# Patient Record
Sex: Female | Born: 1940 | Race: Black or African American | Hispanic: No | Smoking: Never smoker
Health system: Southern US, Community
[De-identification: ages and names within clinical notes are randomized; demographics above are authoritative.]

## PROBLEM LIST (undated history)

## (undated) DIAGNOSIS — I10 Essential (primary) hypertension: Secondary | ICD-10-CM

## (undated) DIAGNOSIS — I82411 Acute embolism and thrombosis of right femoral vein: Secondary | ICD-10-CM

## (undated) DIAGNOSIS — I639 Cerebral infarction, unspecified: Secondary | ICD-10-CM

## (undated) DIAGNOSIS — C801 Malignant (primary) neoplasm, unspecified: Secondary | ICD-10-CM

---

## 2018-12-22 DIAGNOSIS — H47231 Glaucomatous optic atrophy, right eye: Secondary | ICD-10-CM | POA: Diagnosis not present

## 2018-12-22 DIAGNOSIS — I10 Essential (primary) hypertension: Secondary | ICD-10-CM | POA: Diagnosis not present

## 2018-12-22 DIAGNOSIS — Z13220 Encounter for screening for lipoid disorders: Secondary | ICD-10-CM | POA: Diagnosis not present

## 2018-12-22 DIAGNOSIS — E559 Vitamin D deficiency, unspecified: Secondary | ICD-10-CM | POA: Diagnosis not present

## 2018-12-29 IMAGING — US ULTRASOUND ABDOMEN COMPLETE
1 series · 14 of 25 positions shown · non-contrast
Comparison: None.

CLINICAL DATA: Right upper quadrant pain for 2 months.

EXAM:
ABDOMEN ULTRASOUND COMPLETE

[Series 1: ultrasound abdomen complete · 0.23mm/px · 14 of 85 slices shown]
[im 1/85]
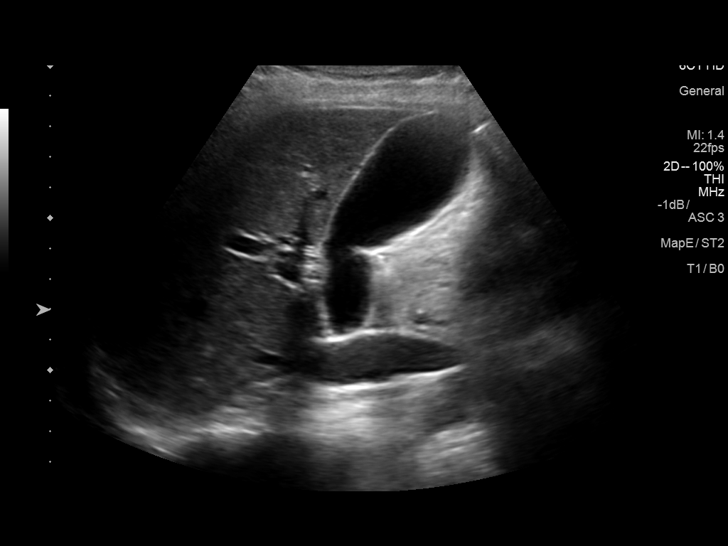
[im 8/85]
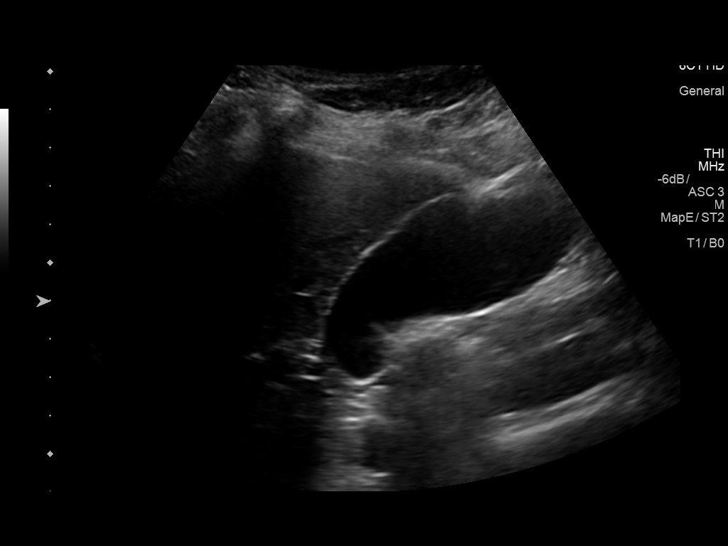
[im 15/85]
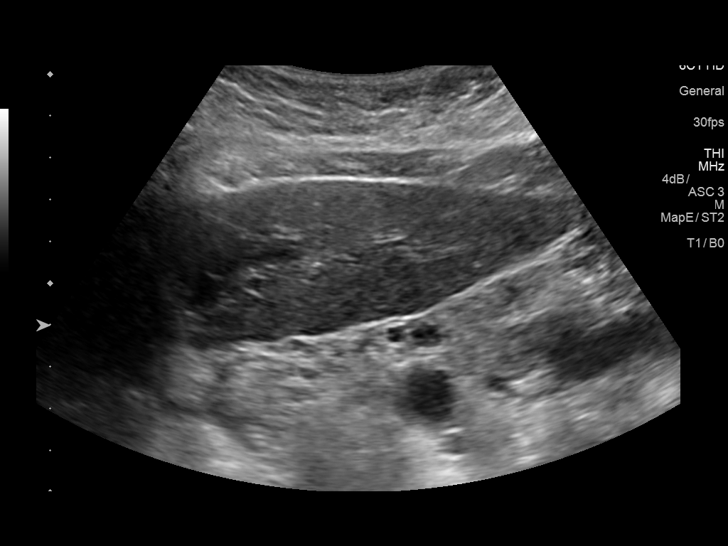
[im 22/85]
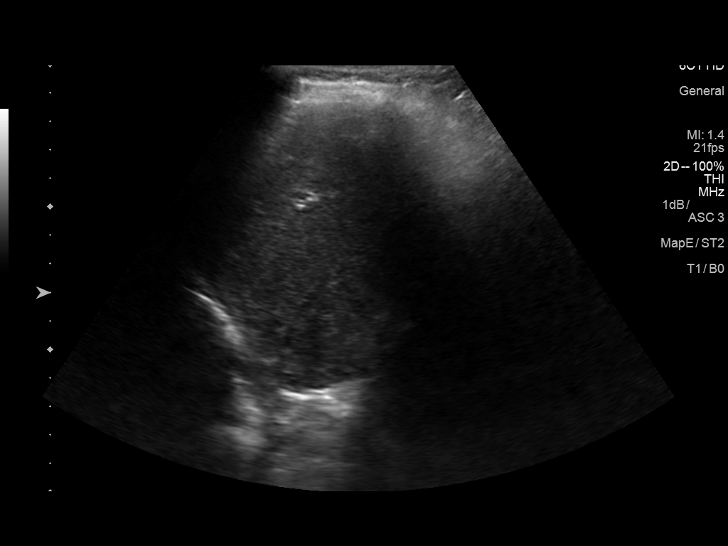
[im 29/85]
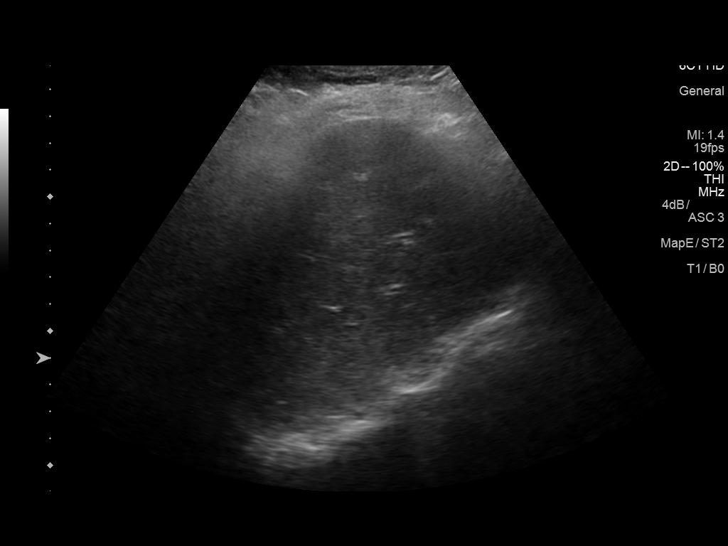
[im 32/85]
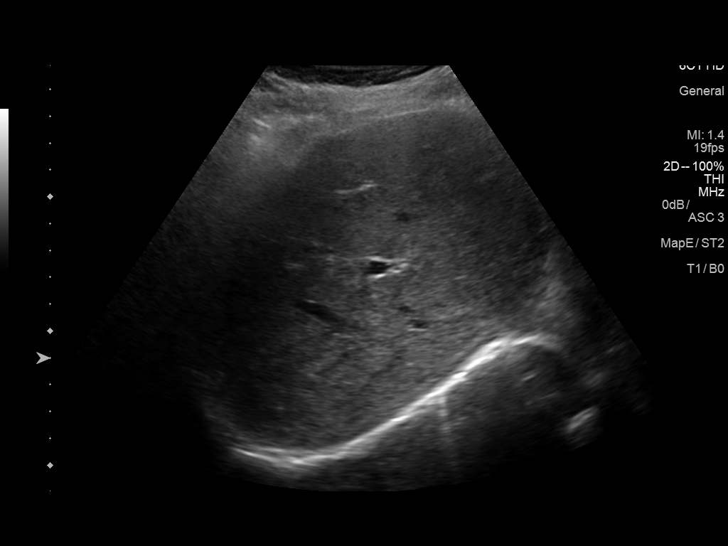
[im 39/85]
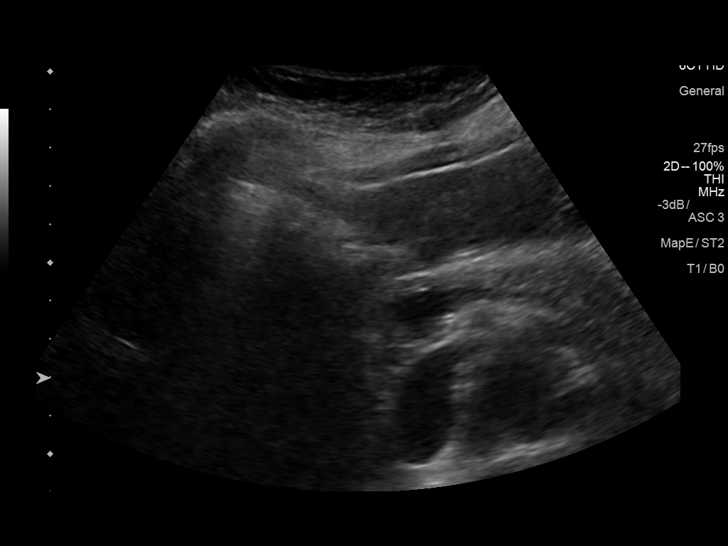
[im 46/85]
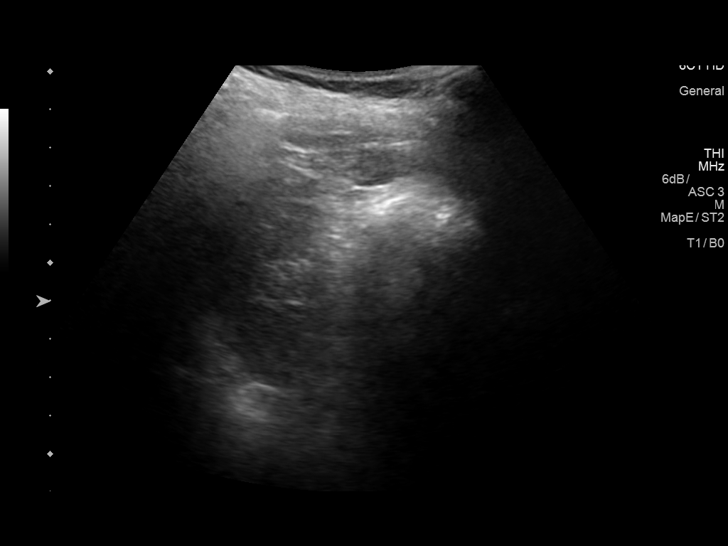
[im 53/85]
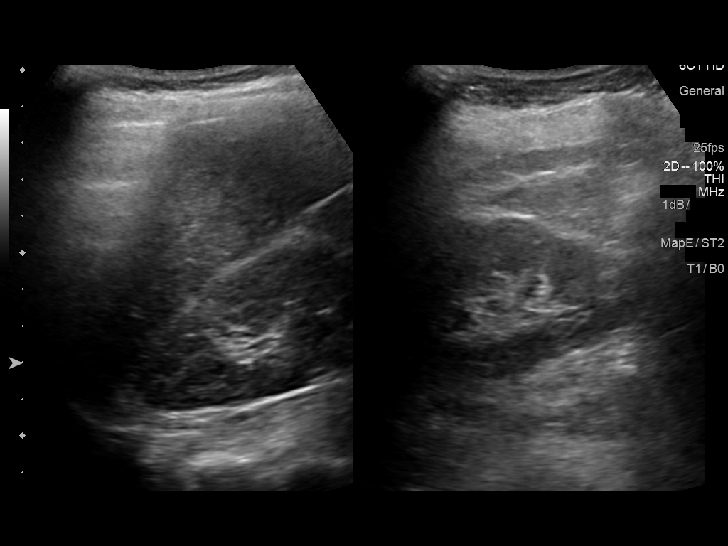
[im 57/85]
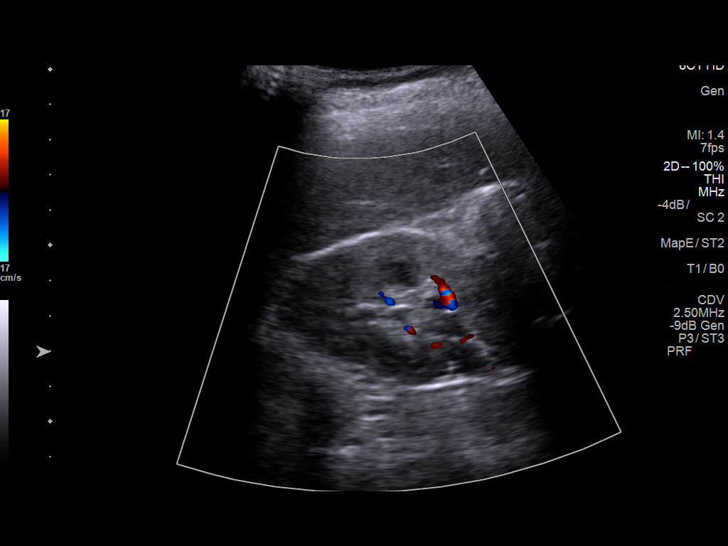
[im 64/85]
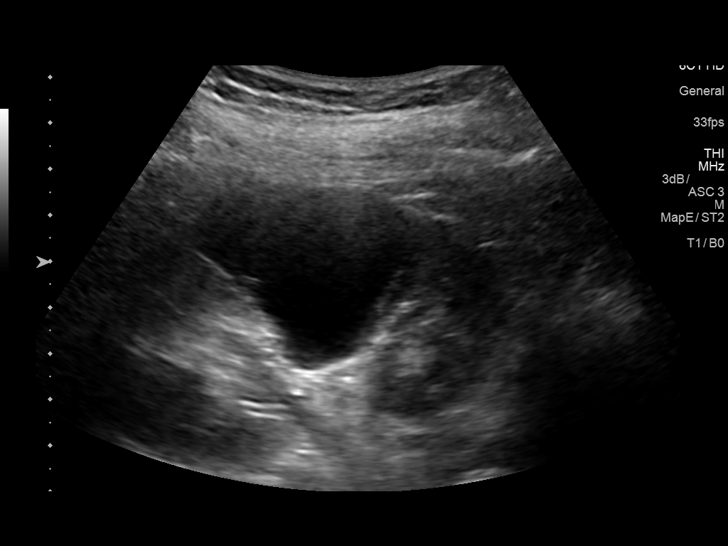
[im 71/85]
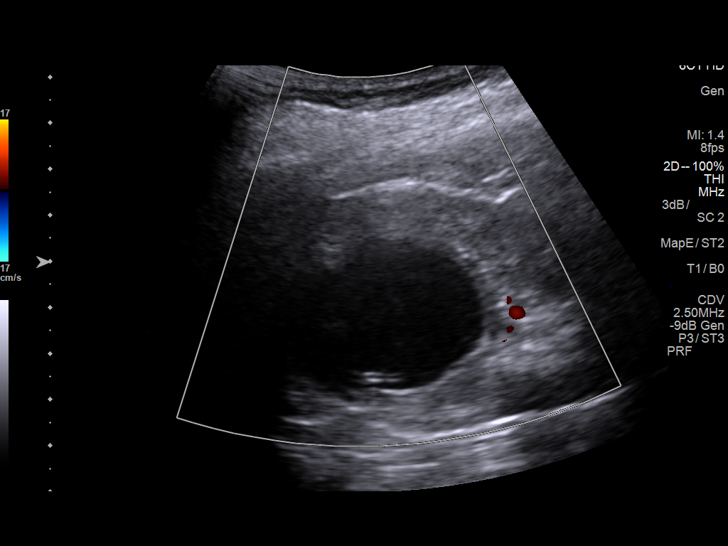
[im 78/85]
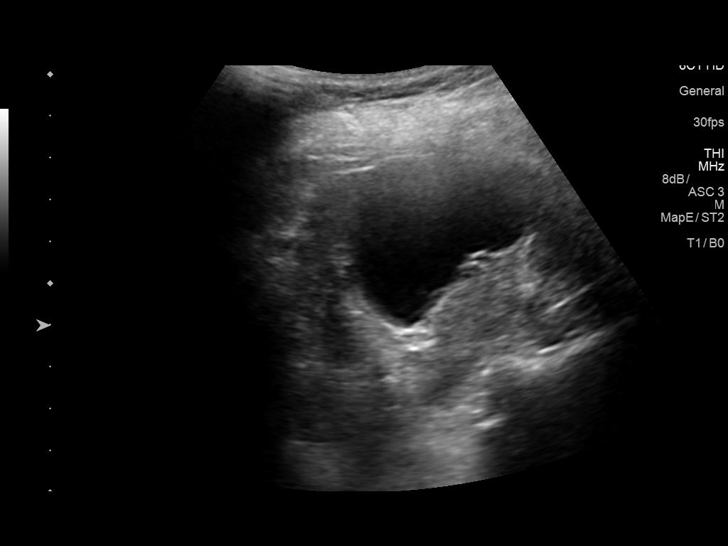
[im 85/85]
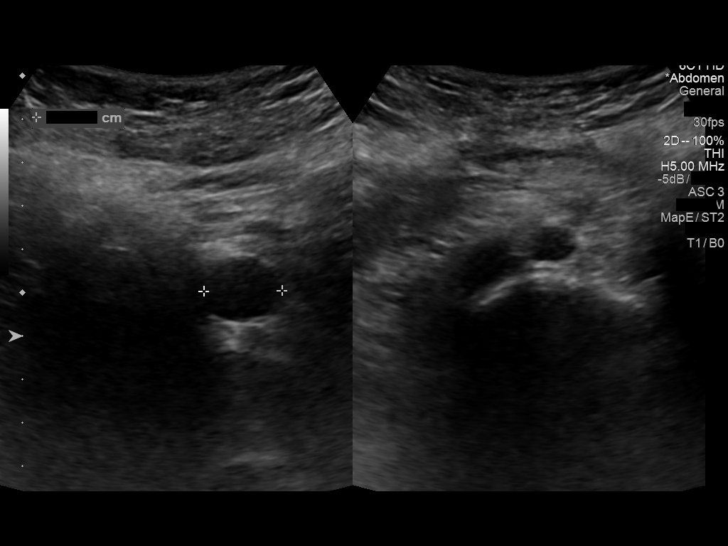

[14 of 25 positions shown; findings below may reference images not displayed]

FINDINGS: Gallbladder: No gallstones or wall thickening visualized. No
sonographic Murphy sign noted by sonographer.

Common bile duct: Diameter: 3.7 mm

Liver: No focal lesion identified. Within normal limits in
parenchymal echogenicity. Portal vein is patent on color Doppler
imaging with normal direction of blood flow towards the liver.

IVC: No abnormality visualized.

Pancreas: Visualized portion unremarkable.

Spleen: Size and appearance within normal limits.

Right Kidney: Length: 10.6 cm. Echogenicity within normal limits. No
mass or hydronephrosis visualized.

Left Kidney: Length: 10.1 cm. Echogenicity within normal limits. No
hydronephrosis visualized. Large mid polar mildly complicated cyst
measures 5.1 x 3.9 x 4.4 cm.

Abdominal aorta: No aneurysm visualized.

Other findings: None.
IMPRESSION: No evidence of cholelithiasis or acute cholecystitis.

Large mildly complicated 5.1 cm left renal cyst.

## 2019-01-02 ENCOUNTER — Ambulatory Visit
Admission: RE | Admit: 2019-01-02 | Discharge: 2019-01-02 | Disposition: A | Discharge status: Home / Self Care | Payer: Medicare HMO | Source: Ambulatory Visit | Attending: Nurse Practitioner | Admitting: Nurse Practitioner

## 2019-01-02 ENCOUNTER — Other Ambulatory Visit: Payer: Self-pay | Admitting: Nurse Practitioner

## 2019-01-02 DIAGNOSIS — R1011 Right upper quadrant pain: Secondary | ICD-10-CM | POA: Diagnosis not present

## 2019-01-02 DIAGNOSIS — R42 Dizziness and giddiness: Secondary | ICD-10-CM | POA: Diagnosis not present

## 2019-01-02 DIAGNOSIS — E559 Vitamin D deficiency, unspecified: Secondary | ICD-10-CM | POA: Diagnosis not present

## 2019-01-02 DIAGNOSIS — I10 Essential (primary) hypertension: Secondary | ICD-10-CM | POA: Diagnosis not present

## 2019-01-02 DIAGNOSIS — H47231 Glaucomatous optic atrophy, right eye: Secondary | ICD-10-CM | POA: Diagnosis not present

## 2019-01-02 DIAGNOSIS — Z0000 Encounter for general adult medical examination without abnormal findings: Secondary | ICD-10-CM | POA: Diagnosis not present

## 2019-01-02 IMAGING — CR DG ABDOMEN 1V
1 series · 1 of 1 positions shown · non-contrast
Comparison: None.

CLINICAL DATA: Right upper quadrant pain

EXAM:
ABDOMEN - 1 VIEW

[w abdomen upright]
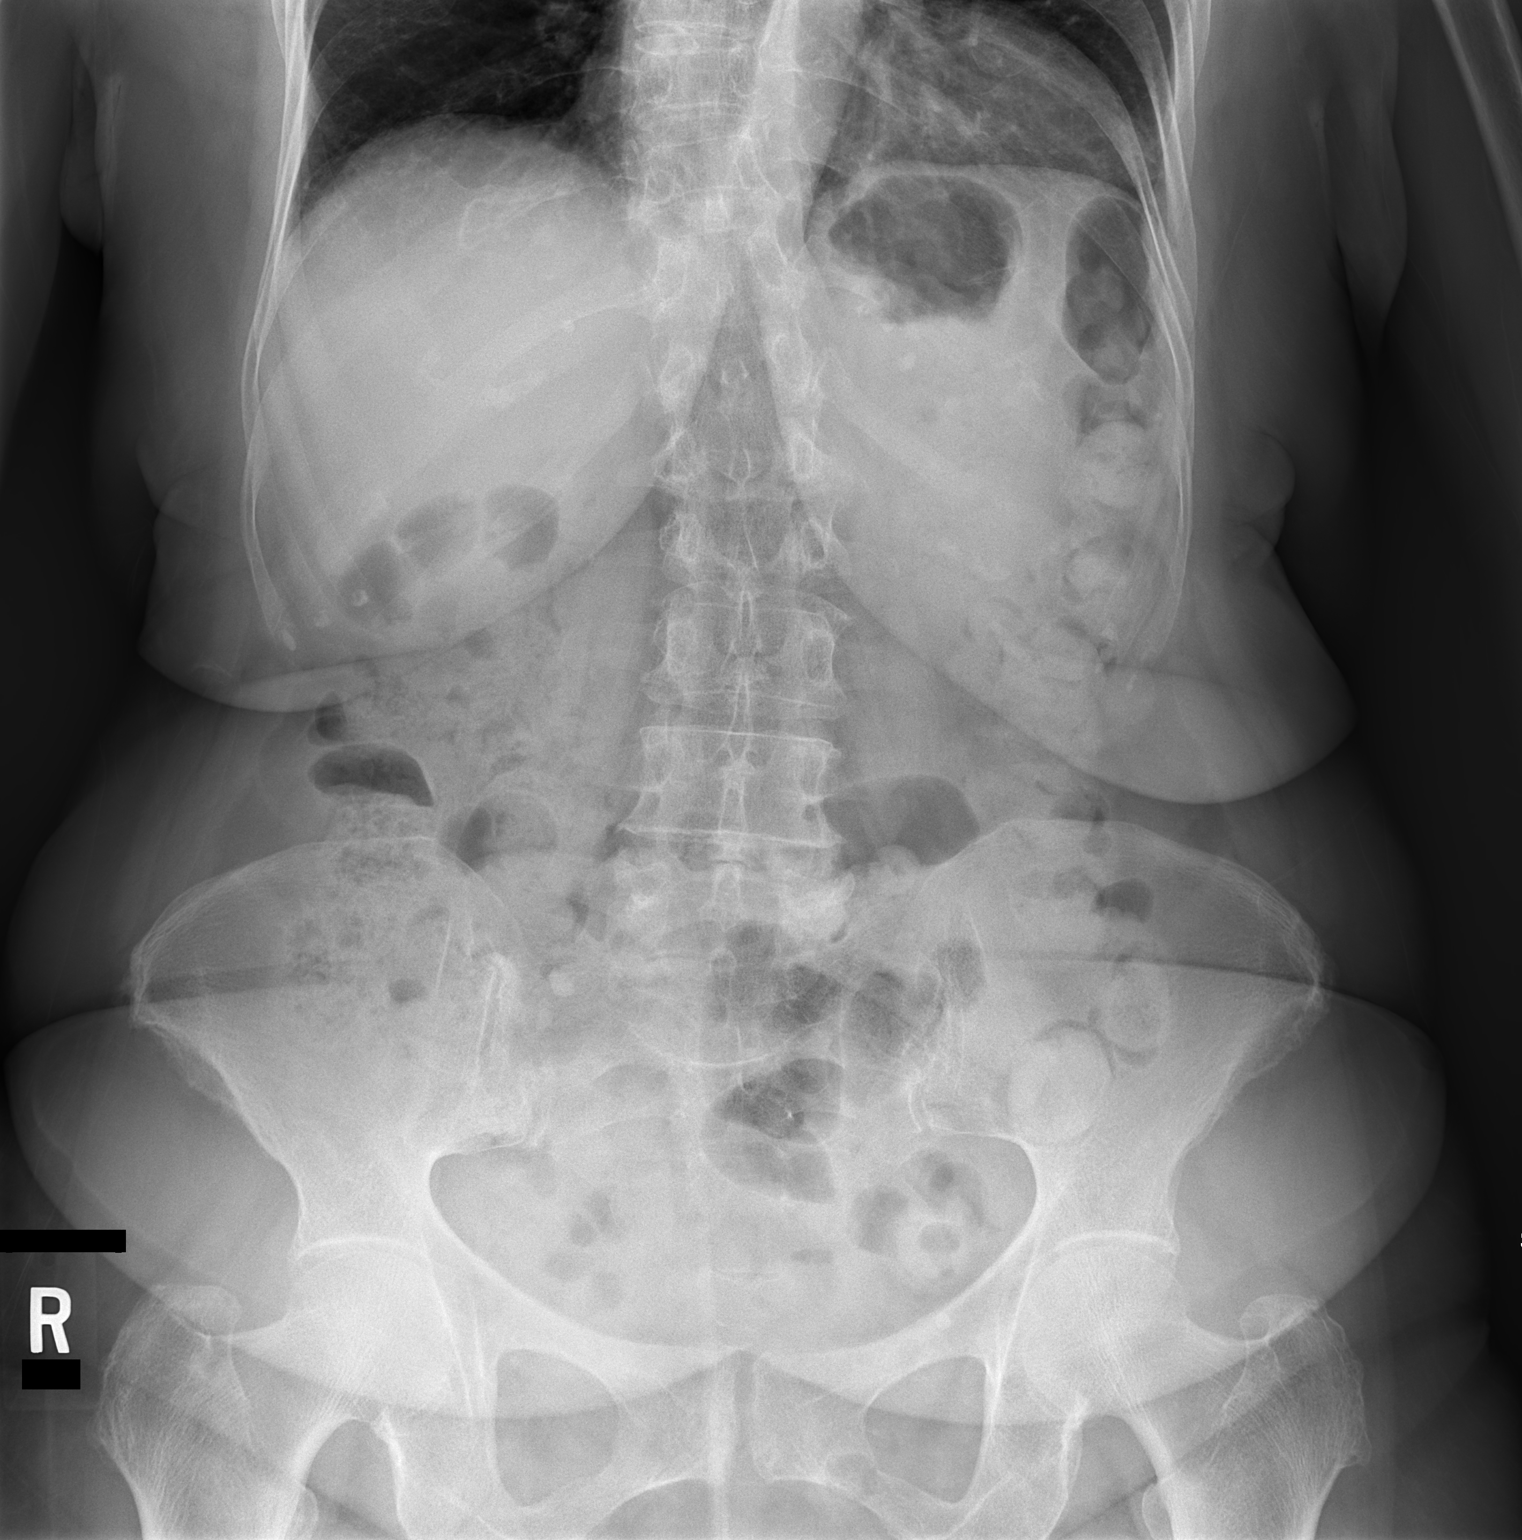

[1 of 1 positions shown; findings below may reference images not displayed]

FINDINGS: Moderate stool burden. There is a non obstructive bowel gas pattern.
No supine evidence of free air. No organomegaly or suspicious
calcification. No acute bony abnormality. Lung bases clear.
IMPRESSION: Moderate stool burden.  No acute findings.

## 2019-01-06 ENCOUNTER — Other Ambulatory Visit: Payer: Self-pay | Admitting: Nurse Practitioner

## 2019-01-06 DIAGNOSIS — R1011 Right upper quadrant pain: Secondary | ICD-10-CM

## 2019-01-12 ENCOUNTER — Ambulatory Visit
Admission: RE | Admit: 2019-01-12 | Discharge: 2019-01-12 | Disposition: A | Discharge status: Home / Self Care | Payer: Medicare HMO | Source: Ambulatory Visit | Attending: Nurse Practitioner | Admitting: Nurse Practitioner

## 2019-01-12 DIAGNOSIS — R1011 Right upper quadrant pain: Secondary | ICD-10-CM

## 2019-01-13 DIAGNOSIS — I10 Essential (primary) hypertension: Secondary | ICD-10-CM | POA: Diagnosis not present

## 2019-01-13 DIAGNOSIS — K210 Gastro-esophageal reflux disease with esophagitis: Secondary | ICD-10-CM | POA: Diagnosis not present

## 2019-01-13 DIAGNOSIS — D640 Hereditary sideroblastic anemia: Secondary | ICD-10-CM | POA: Diagnosis not present

## 2019-03-20 DIAGNOSIS — K219 Gastro-esophageal reflux disease without esophagitis: Secondary | ICD-10-CM | POA: Diagnosis not present

## 2019-03-20 DIAGNOSIS — D508 Other iron deficiency anemias: Secondary | ICD-10-CM | POA: Diagnosis not present

## 2019-03-20 DIAGNOSIS — I10 Essential (primary) hypertension: Secondary | ICD-10-CM | POA: Diagnosis not present

## 2019-03-20 DIAGNOSIS — E559 Vitamin D deficiency, unspecified: Secondary | ICD-10-CM | POA: Diagnosis not present

## 2019-06-01 DIAGNOSIS — Z20828 Contact with and (suspected) exposure to other viral communicable diseases: Secondary | ICD-10-CM | POA: Diagnosis not present

## 2019-06-21 DIAGNOSIS — I10 Essential (primary) hypertension: Secondary | ICD-10-CM | POA: Diagnosis not present

## 2019-06-21 DIAGNOSIS — R42 Dizziness and giddiness: Secondary | ICD-10-CM | POA: Diagnosis not present

## 2019-06-21 DIAGNOSIS — I110 Hypertensive heart disease with heart failure: Secondary | ICD-10-CM | POA: Diagnosis not present

## 2019-06-28 DIAGNOSIS — R42 Dizziness and giddiness: Secondary | ICD-10-CM | POA: Diagnosis not present

## 2019-06-28 DIAGNOSIS — D640 Hereditary sideroblastic anemia: Secondary | ICD-10-CM | POA: Diagnosis not present

## 2019-06-28 DIAGNOSIS — I10 Essential (primary) hypertension: Secondary | ICD-10-CM | POA: Diagnosis not present

## 2019-07-11 DIAGNOSIS — I10 Essential (primary) hypertension: Secondary | ICD-10-CM | POA: Diagnosis not present

## 2019-07-11 DIAGNOSIS — D508 Other iron deficiency anemias: Secondary | ICD-10-CM | POA: Diagnosis not present

## 2020-02-07 DIAGNOSIS — I10 Essential (primary) hypertension: Secondary | ICD-10-CM | POA: Diagnosis not present

## 2020-02-07 DIAGNOSIS — D510 Vitamin B12 deficiency anemia due to intrinsic factor deficiency: Secondary | ICD-10-CM | POA: Diagnosis not present

## 2020-02-07 DIAGNOSIS — D508 Other iron deficiency anemias: Secondary | ICD-10-CM | POA: Diagnosis not present

## 2020-02-07 DIAGNOSIS — D529 Folate deficiency anemia, unspecified: Secondary | ICD-10-CM | POA: Diagnosis not present

## 2020-02-07 DIAGNOSIS — E559 Vitamin D deficiency, unspecified: Secondary | ICD-10-CM | POA: Diagnosis not present

## 2020-02-07 DIAGNOSIS — H47231 Glaucomatous optic atrophy, right eye: Secondary | ICD-10-CM | POA: Diagnosis not present

## 2020-02-13 DIAGNOSIS — Z1212 Encounter for screening for malignant neoplasm of rectum: Secondary | ICD-10-CM | POA: Diagnosis not present

## 2020-02-13 DIAGNOSIS — Z1211 Encounter for screening for malignant neoplasm of colon: Secondary | ICD-10-CM | POA: Diagnosis not present

## 2020-02-15 ENCOUNTER — Ambulatory Visit: Payer: Medicare HMO | Attending: Internal Medicine

## 2020-02-15 DIAGNOSIS — Z23 Encounter for immunization: Secondary | ICD-10-CM

## 2020-02-15 NOTE — Progress Notes (Signed)
   Covid-19 Vaccination Clinic  Name:  Tiffany [name]    MRN: [id number] DOB: 1941/08/10  02/15/2020  Tiffany [name] was observed post Covid-19 immunization for 15 minutes without incident. She was provided with Vaccine Information Sheet and instruction to access the V-Safe system.   Tiffany [name] was instructed to call 911 with any severe reactions post vaccine: Marland Kitchen Difficulty breathing  . Swelling of face and throat  . A fast heartbeat  . A bad rash all over body  . Dizziness and weakness   Immunizations Administered    Name Date Dose VIS Date Route   Pfizer COVID-19 Vaccine 02/15/2020  3:29 PM 0.3 mL 10/13/2019 Intramuscular   Manufacturer: [employer], [employer]   Lot: [id number]   NDC: [address]-4

## 2020-02-18 LAB — EXTERNAL GENERIC LAB PROCEDURE: COLOGUARD: NEGATIVE

## 2020-02-18 LAB — COLOGUARD: COLOGUARD: NEGATIVE

## 2020-03-11 ENCOUNTER — Ambulatory Visit: Payer: Medicare HMO | Attending: Internal Medicine

## 2020-03-11 DIAGNOSIS — Z23 Encounter for immunization: Secondary | ICD-10-CM

## 2020-03-11 NOTE — Progress Notes (Signed)
   Covid-19 Vaccination Clinic  Name:  Tiffany [name]    MRN: [id number] DOB: 1940-12-29  03/11/2020  Tiffany [name] was observed post Covid-19 immunization for 15 minutes without incident. She was provided with Vaccine Information Sheet and instruction to access the V-Safe system.   Tiffany [name] was instructed to call 911 with any severe reactions post vaccine: Marland Kitchen Difficulty breathing  . Swelling of face and throat  . A fast heartbeat  . A bad rash all over body  . Dizziness and weakness   Immunizations Administered    Name Date Dose VIS Date Route   Pfizer COVID-19 Vaccine 03/11/2020  9:14 AM 0.3 mL 12/27/2018 Intramuscular   Manufacturer: [employer], [employer]   Lot: [id number]   NDC: [address]-2

## 2021-03-27 ENCOUNTER — Other Ambulatory Visit (HOSPITAL_COMMUNITY): Payer: Self-pay | Admitting: Family Medicine

## 2021-03-27 ENCOUNTER — Ambulatory Visit (HOSPITAL_COMMUNITY)
Admission: RE | Admit: 2021-03-27 | Discharge: 2021-03-27 | Disposition: A | Discharge status: Home / Self Care | Payer: Medicare PPO | Source: Ambulatory Visit | Attending: Family Medicine | Admitting: Family Medicine

## 2021-03-27 ENCOUNTER — Other Ambulatory Visit: Payer: Self-pay

## 2021-03-27 DIAGNOSIS — M79669 Pain in unspecified lower leg: Secondary | ICD-10-CM | POA: Diagnosis not present

## 2021-03-27 DIAGNOSIS — M7989 Other specified soft tissue disorders: Secondary | ICD-10-CM | POA: Diagnosis not present

## 2021-06-20 ENCOUNTER — Ambulatory Visit
Admission: RE | Admit: 2021-06-20 | Discharge: 2021-06-20 | Disposition: A | Discharge status: Home / Self Care | Payer: Medicare PPO | Source: Ambulatory Visit | Attending: Family Medicine | Admitting: Family Medicine

## 2021-06-20 ENCOUNTER — Other Ambulatory Visit: Payer: Self-pay | Admitting: Family Medicine

## 2021-06-20 DIAGNOSIS — R634 Abnormal weight loss: Secondary | ICD-10-CM

## 2021-06-20 IMAGING — CR DG CHEST 2V
2 series · 2 of 2 positions shown · non-contrast
Comparison: None.

CLINICAL DATA: Unexplained weight loss

EXAM:
CHEST - 2 VIEW

[w chest pa]
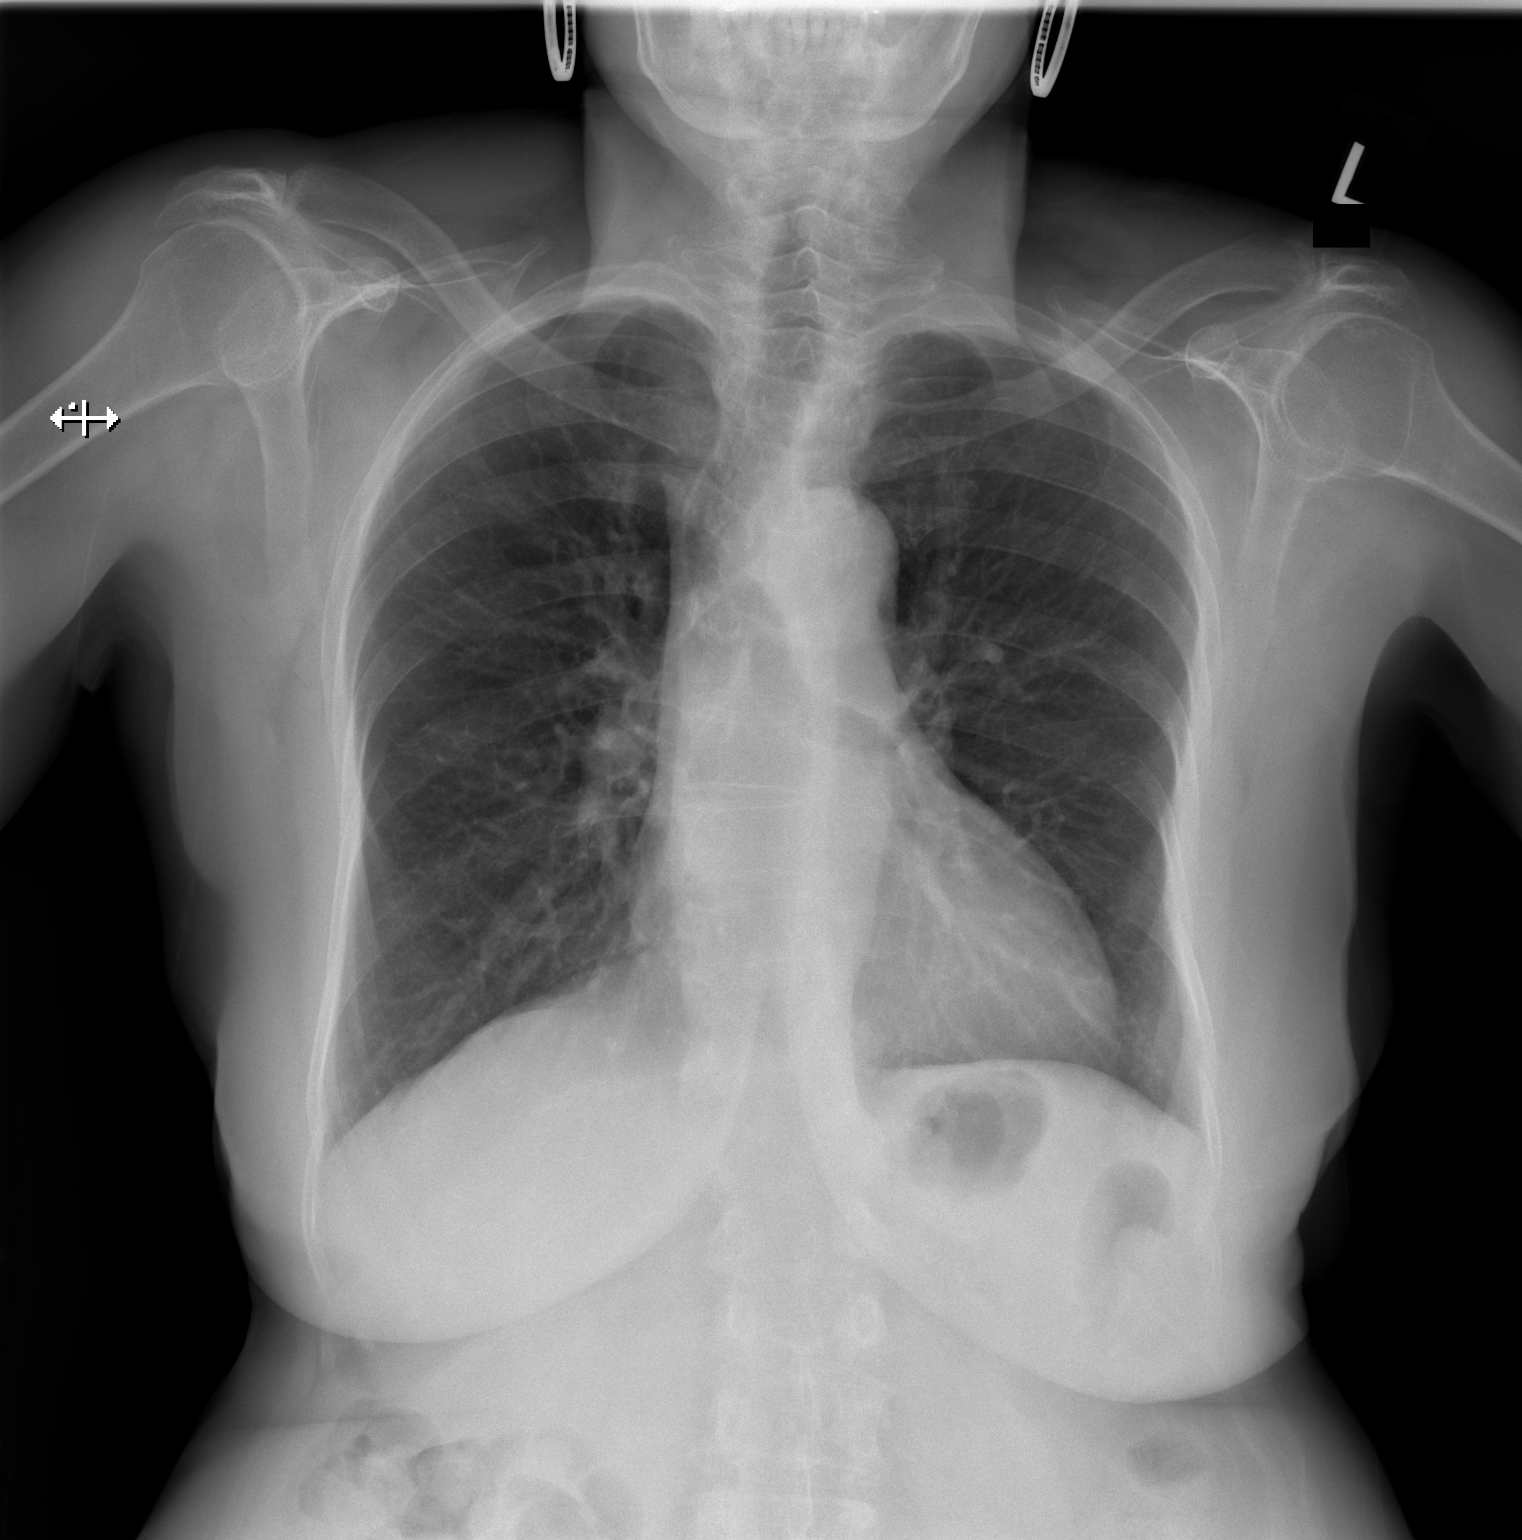

[w chest lat]
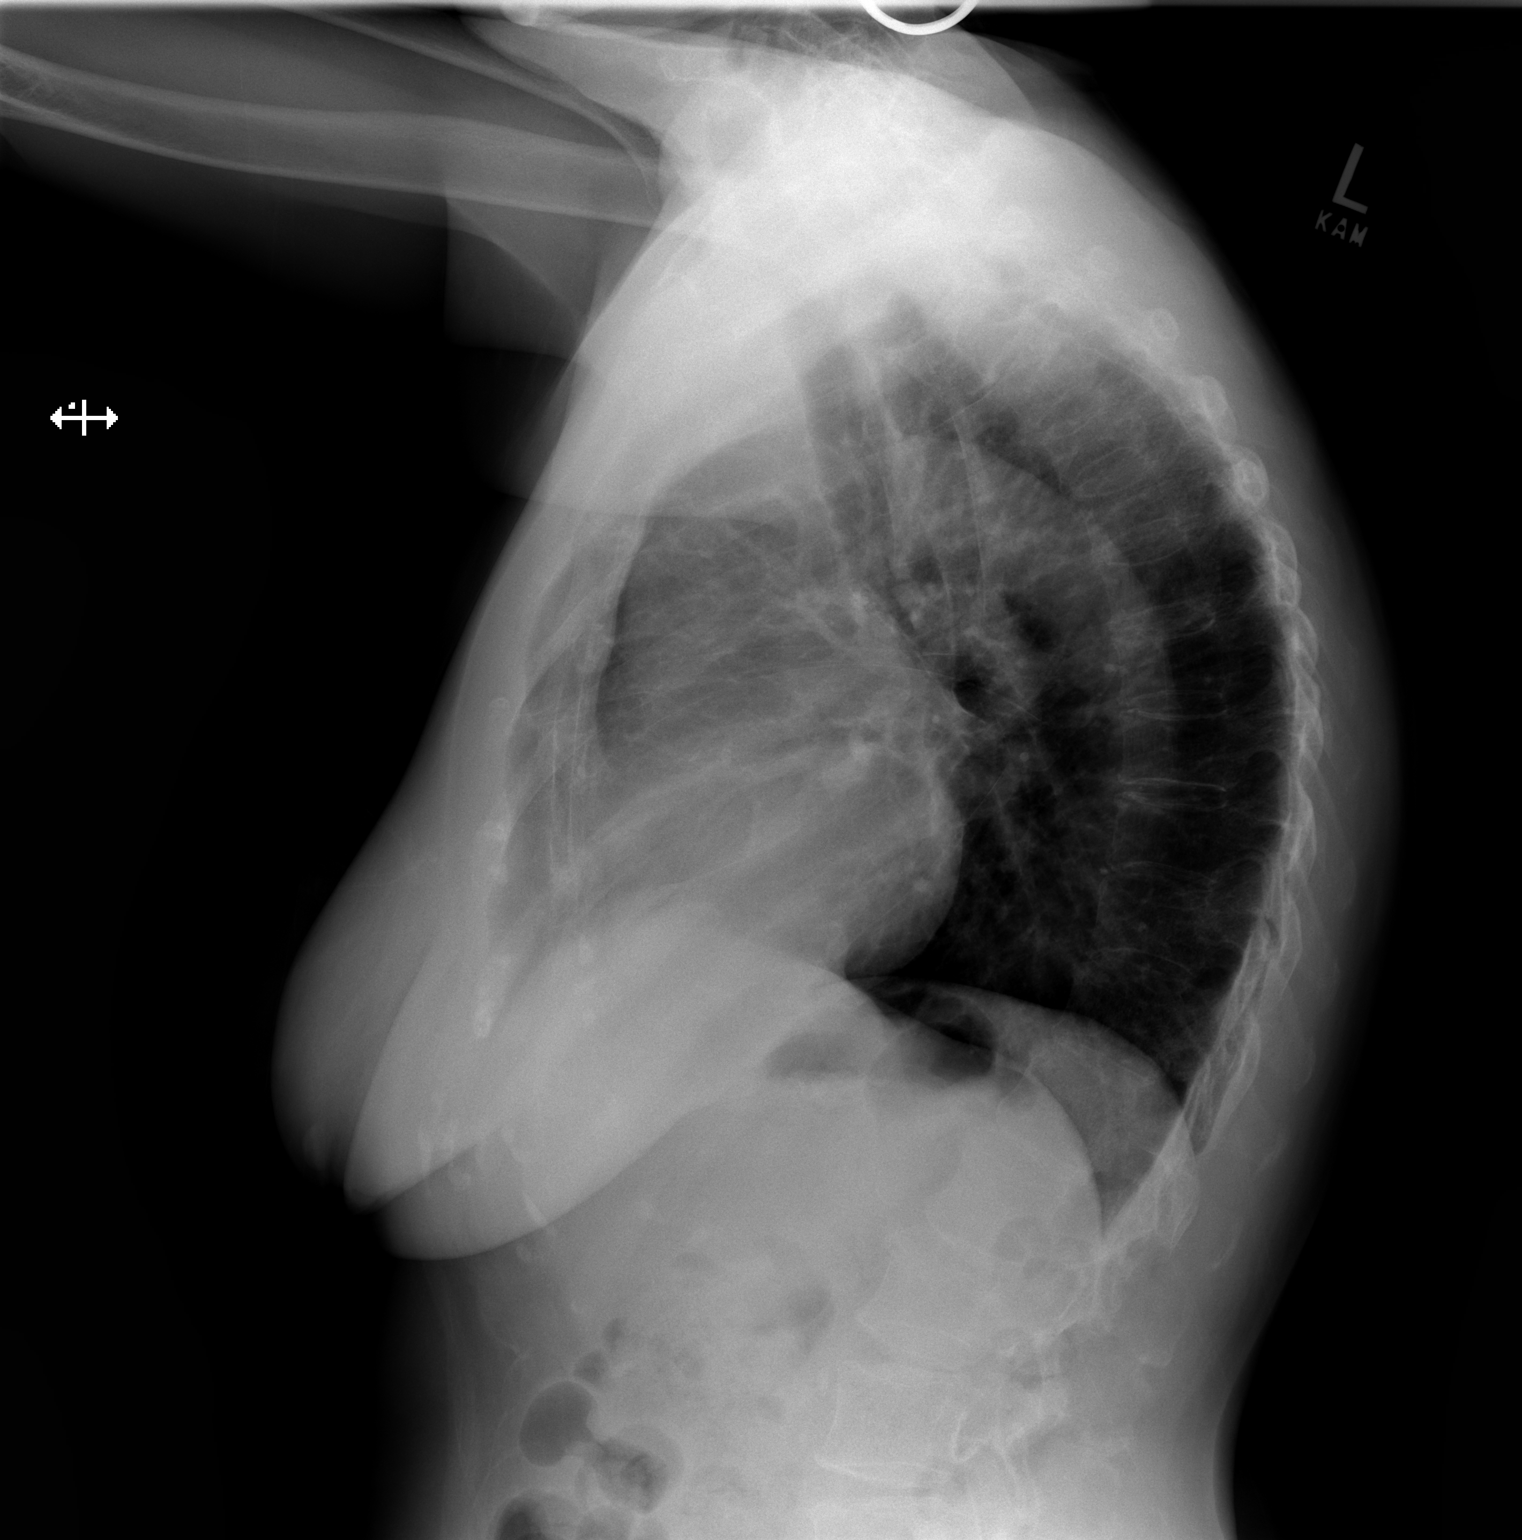

[2 of 2 positions shown; findings below may reference images not displayed]

FINDINGS: The heart size and mediastinal contours are within normal limits.
Both lungs are clear. The visualized skeletal structures are
unremarkable.
IMPRESSION: No active cardiopulmonary disease.

## 2021-10-01 ENCOUNTER — Ambulatory Visit: Payer: Medicare PPO | Admitting: Rehabilitation

## 2021-10-01 ENCOUNTER — Ambulatory Visit: Payer: Medicare PPO | Attending: Family Medicine

## 2021-10-01 ENCOUNTER — Other Ambulatory Visit: Payer: Self-pay

## 2021-10-01 VITALS — BP 110/70 | HR 82

## 2021-10-01 DIAGNOSIS — R2689 Other abnormalities of gait and mobility: Secondary | ICD-10-CM | POA: Insufficient documentation

## 2021-10-01 DIAGNOSIS — R262 Difficulty in walking, not elsewhere classified: Secondary | ICD-10-CM | POA: Insufficient documentation

## 2021-10-01 DIAGNOSIS — R42 Dizziness and giddiness: Secondary | ICD-10-CM | POA: Insufficient documentation

## 2021-10-01 DIAGNOSIS — M6281 Muscle weakness (generalized): Secondary | ICD-10-CM | POA: Insufficient documentation

## 2021-10-01 NOTE — Therapy (Signed)
[facility] [address] [place], [place], [address] Phone: [phone]   Fax:  [phone]  Physical Therapy Evaluation  Patient Details  Name: Charliene Inoue MRN: [id number] Date of Birth: [birth year] Referring Provider (PT): Renaye Rakers   Encounter Date: 10/01/2021   PT End of Session - 10/01/21 0935     Visit Number 1    Number of Visits 17    Date for PT Re-Evaluation 11/28/21    Authorization Type human[employer] medicare so 10th visit progress note    PT Start Time 0933    PT Stop Time 1014    PT Time Calculation (min) 41 min    Equipment Utilized During Treatment Gait belt    Activity Tolerance Patient tolerated treatment well    Behavior During Therapy [facility] for tasks assessed/performed             History reviewed. No pertinent past medical history.  History reviewed. No pertinent surgical history.  Vitals:   10/01/21 0947 10/01/21 0948  BP: 132/78 110/70  Pulse: 82       Subjective Assessment - 10/01/21 0936     Subjective Pt reports 2 falls when walking in to kitchen when tripped in 05/2019 and 02/2020. She reports that she can't seem to walk straight and does not feel comfortable. Feels that short distances are more challenging. Reports yesterday she had some dizziness that went away pretty quickly when was looking from one thing to another quickly. Does not describe as room spinning. Pt just moved to [place] from [place] to live with daughter. Reports had echo and stress test done and everything was ok. Losartan was changed to metoprolol and palpitations have improved since.    Pertinent History PMH:  hypertension, hyperlipidemia, palpitations., hyperthyroid. Glaucoma eye surgery.    Patient Stated Goals Pt would like to improve her steps with walking like she should.    Currently in Pain? No/denies                [facility] PT Assessment - 10/01/21 0942       Assessment   Medical Diagnosis unsteadiness  on feet    Referring Provider (PT) Renaye Rakers    Onset Date/Surgical Date 07/21/21      Precautions   Precautions Fall      Balance Screen   Has the patient fallen in the past 6 months No    Has the patient had a decrease in activity level because of a fear of falling?  Yes    Is the patient reluctant to leave their home because of a fear of falling?  Yes      Home Environment   Living Environment Private residence    Living Arrangements Children    Available Help at Discharge Family    Type of Home House    Home Access Stairs to enter    Entrance Stairs-Number of Steps 6    Entrance Stairs-Rails Left    Home Layout Two level;Able to live on main level with bedroom/bathroom    Home Equipment None      Prior Function   Level of Independence Independent    Vocation Retired    [employer] retired Engineer, civil (consulting)    Leisure TV, play cards, sewing      Cognition   Overall Cognitive Status Within Functional Limits for tasks assessed      Observation/Other Assessments   Observations Pt wears compression stockings. Intact with smooth pursuit and saccades with eye movements.  Sensation   Light Touch Appears Intact      ROM / Strength   AROM / PROM / Strength Strength      Strength   Strength Assessment Site Shoulder;Elbow;Hand;Hip;Knee;Ankle    Right/Left Shoulder Right;Left    Right Shoulder Flexion 5/5    Left Shoulder Flexion 5/5    Right/Left Elbow Right;Left    Right Elbow Flexion 5/5    Right Elbow Extension 5/5    Left Elbow Flexion 5/5    Left Elbow Extension 5/5    Right/Left hand Right;Left    Right Hand Gross Grasp Functional    Left Hand Gross Grasp Functional    Right/Left Hip Right;Left    Right Hip Flexion 4+/5    Right Hip ABduction 4-/5    Left Hip Flexion 4/5    Left Hip ABduction 3+/5    Right/Left Knee Right;Left    Right Knee Flexion 5/5    Right Knee Extension 5/5    Left Knee Flexion 5/5    Left Knee Extension 5/5    Right/Left Ankle  Right;Left    Right Ankle Dorsiflexion 5/5    Left Ankle Dorsiflexion 5/5      Transfers   Transfers Sit to Stand;Stand to Sit    Sit to Stand 7: Independent    Five time sit to stand comments  10.69 from chair without hands    Stand to Sit 7: Independent      Ambulation/Gait   Ambulation/Gait Yes    Ambulation/Gait Assistance 5: Supervision    Ambulation/Gait Assistance Details Pt crosses over at times especially with RLE with very narrow BOS. Did stumble slightly right after getting up.    Ambulation Distance (Feet) 200 Feet    Assistive device None    Gait Pattern Step-through pattern;Scissoring;Narrow base of support    Ambulation Surface Level;Indoor    Gait velocity 12.16 sec=0.29m/s    Stairs Yes    Stairs Assistance 6: Modified independent (Device/Increase time)    Stair Management Technique One rail Right;Alternating pattern    Number of Stairs 4    Height of Stairs 6      Functional Gait  Assessment   Gait assessed  Yes    Gait Level Surface Walks 20 ft, slow speed, abnormal gait pattern, evidence for imbalance or deviates 10-15 in outside of the 12 in walkway width. Requires more than 7 sec to ambulate 20 ft.    Change in Gait Speed Makes only minor adjustments to walking speed, or accomplishes a change in speed with significant gait deviations, deviates 10-15 in outside the 12 in walkway width, or changes speed but loses balance but is able to recover and continue walking.    Gait with Horizontal Head Turns Performs head turns smoothly with slight change in gait velocity (eg, minor disruption to smooth gait path), deviates 6-10 in outside 12 in walkway width, or uses an assistive device.    Gait with Vertical Head Turns Performs task with slight change in gait velocity (eg, minor disruption to smooth gait path), deviates 6 - 10 in outside 12 in walkway width or uses assistive device   reported feeling unsteady feeling but not dizzy   Gait and Pivot Turn Pivot turns safely  within 3 sec and stops quickly with no loss of balance.    Step Over Obstacle Is able to step over 2 stacked shoe boxes taped together (9 in total height) without changing gait speed. No evidence of imbalance.    Gait  with Narrow Base of Support Ambulates 7-9 steps.    Gait with Eyes Closed Walks 20 ft, slow speed, abnormal gait pattern, evidence for imbalance, deviates 10-15 in outside 12 in walkway width. Requires more than 9 sec to ambulate 20 ft.    Ambulating Backwards Walks 20 ft, uses assistive device, slower speed, mild gait deviations, deviates 6-10 in outside 12 in walkway width.    Steps Alternating feet, must use rail.    Total Score 19                        Objective measurements completed on examination: See above findings.                PT Education - 10/01/21 1030     Education Details PT plan of care    Person(s) Educated Patient    Methods Explanation    Comprehension Verbalized understanding              PT Short Term Goals - 10/01/21 1040       PT SHORT TERM GOAL #1   Title Pt will be independent on HEP for strengtheing and balance to continue gains on own.    Time 4    Period Weeks    Status New    Target Date 10/29/21      PT SHORT TERM GOAL #2   Title Pt will increase FGA from 19 to >23/30 for improved balance and gait safety.    Baseline 10/01/21 19/30    Time 4    Period Weeks    Status New    Target Date 10/29/21      PT SHORT TERM GOAL #3   Title Pt will decrease 5 x sit to stand from 10.69 sec to <9 sec for improved balance and functional strength.    Baseline 10/01/21 10.69 sec from chair without hands    Time 4    Status New    Target Date 10/29/21      PT SHORT TERM GOAL #4   Title Pt will ambulate on level surfaces negotiating around objects to simulate household environment 300' independently.    Time 4    Period Weeks    Status New    Target Date 10/29/21               PT Long Term Goals -  10/01/21 1114       PT LONG TERM GOAL #1   Title Pt will ambulate >1000' on varied surfaces independently with no LOB for improved community mobility. (LTGs due 11/28/21)    Time 8    Period Weeks    Status New    Target Date 11/28/21      PT LONG TERM GOAL #2   Title Pt will increase FGA to >26/30 for improved gait safety and balance.    Baseline 10/01/21 19/30    Time 8    Period Weeks    Status New    Target Date 11/28/21      PT LONG TERM GOAL #3   Title Pt will be able to ambulate up/down 4 steps without rail in reciprocal pattern for improved balance and functional strength.    Time 8    Period Weeks    Status New    Target Date 11/28/21                    Plan - 10/01/21 1030  Clinical Impression Statement Pt is 80 y/o female who presents with reports of feeling less steady on feet over past couple years with 2 falls in distant past. Pt was noted to have narrow BOS with crossing over especially on right at times. She is ambulating at gait speed of 0.45m/s indicating safe community speed but decreased compared to norms. She is medium fall risk based on FGA of 19/30. 5 x sit to stand score was 10.69 sec. She presents with decreased strength in hips with hip abduction being most limited wtih 3+/5 strength. Pt did not report any room spinning or lightheaded feeling during session but would benefit from further vestibular evaluation to rule out contributing factors. Describes her dizziness more like unsteady feeling. Was not orthostatic but BP did drop with sit to stand. Pt will benefit from skilled PT to address strength, balance and functional mobility deficits.    Personal Factors and Comorbidities Comorbidity 3+;Age    Comorbidities hypertension, hyperlipidemia, palpitations., hyperthyroid.    Examination-Activity Limitations Locomotion Level;Stairs;Stand    Examination-Participation Restrictions Psychologist, forensic;Yard Work;Shop    Stability/Clinical  Decision Making Stable/Uncomplicated    Clinical Decision Making Moderate    Rehab Potential Good    PT Frequency 2x / week    PT Duration 8 weeks   plus eval   PT Treatment/Interventions ADLs/Self Care Home Management;Canalith Repostioning;Gait training;Stair training;Functional mobility training;Therapeutic activities;DME Instruction;Therapeutic exercise;Balance training;Neuromuscular re-education;Manual techniques;Passive range of motion;Vestibular;Patient/family education    PT Next Visit Plan Nehemiah Settle- please screen vestibular system more to see if could be contributing. She did report "dizziness" the day before with quick eye movements from TV to another object. Smooth pursuit and saccades appeared intact at eval but were all I was able to assess.  Unsteady feeling with vertical head turns but no room spinning. Heart palpatations have improved with metoprolol addition. Issue initial HEP for hip abductor strengthening and balance.    Consulted and Agree with Plan of Care Patient             Patient will benefit from skilled therapeutic intervention in order to improve the following deficits and impairments:  Abnormal gait, Decreased mobility, Decreased strength, Decreased balance, Difficulty walking  Visit Diagnosis: Other abnormalities of gait and mobility - Plan: PT plan of care cert/re-cert  Muscle weakness (generalized) - Plan: PT plan of care cert/re-cert  Difficulty in walking, not elsewhere classified - Plan: PT plan of care cert/re-cert  Dizziness and giddiness - Plan: PT plan of care cert/re-cert     Problem List There are no problems to display for this patient.   Ronn Melena, PT, DPT, NCS 10/01/2021, 11:24 AM  [facility] [address] [place], [place], [address] Phone: [phone]   Fax:  [phone]  Name: Cyana Shook MRN: [id number] Date of Birth: 1941/08/19

## 2021-10-08 ENCOUNTER — Ambulatory Visit: Payer: Medicare PPO | Attending: Family Medicine

## 2021-10-08 ENCOUNTER — Other Ambulatory Visit: Payer: Self-pay

## 2021-10-08 DIAGNOSIS — M6281 Muscle weakness (generalized): Secondary | ICD-10-CM | POA: Diagnosis not present

## 2021-10-08 DIAGNOSIS — R2689 Other abnormalities of gait and mobility: Secondary | ICD-10-CM

## 2021-10-08 DIAGNOSIS — R262 Difficulty in walking, not elsewhere classified: Secondary | ICD-10-CM | POA: Diagnosis not present

## 2021-10-08 DIAGNOSIS — R42 Dizziness and giddiness: Secondary | ICD-10-CM

## 2021-10-08 NOTE — Therapy (Signed)
[facility] [address] [place], [place], [address] Phone: [phone]   Fax:  [phone]  Physical Therapy Treatment  Patient Details  Name: Tiffany [name] MRN: [id number] Date of Birth: 08-02-1941 Referring Provider (PT): Renaye Rakers   Encounter Date: 10/08/2021   PT End of Session - 10/08/21 0804     Visit Number 2    Number of Visits 17    Date for PT Re-Evaluation 11/28/21    Authorization Type humana medicare so 10th visit progress note    PT Start Time 0803    PT Stop Time 0842    PT Time Calculation (min) 39 min    Equipment Utilized During Treatment Gait belt    Activity Tolerance Patient tolerated treatment well    Behavior During Therapy [facility] for tasks assessed/performed             History reviewed. No pertinent past medical history.  History reviewed. No pertinent surgical history.  There were no vitals filed for this visit.   Subjective Assessment - 10/08/21 0805     Subjective Patient reports that she feels about the same. No falls to report. No pain to report.    Pertinent History PMH:  hypertension, hyperlipidemia, palpitations., hyperthyroid. Glaucoma eye surgery.    Patient Stated Goals Pt would like to improve her steps with walking like she should.    Currently in Pain? No/denies                     Vestibular Assessment - 10/08/21 0001       Symptom Behavior   Subjective history of current problem Reports dizziness started approx early Nov 2022. Reports dizziness occurs with quick heads/body turns, more horizontal. \Denies vision or hearing changes. Also reports pressure in the ears, but this has been present since he got off the plane from [place]. Denies tinnitus.    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 No Dizziness.    VOR to Slow Head Movement Normal    VOR Cancellation Normal      Visual Acuity   Static 6    Dynamic 5      Positional Testing   Sidelying Test Sidelying Right;Sidelying Left    Horizontal Canal Testing Horizontal Canal Right;Horizontal Canal Left      Sidelying Right   Sidelying Right Duration 0    Sidelying Right Symptoms No nystagmus      Sidelying Left   Sidelying Left Duration 0    Sidelying Left Symptoms No nystagmus      Horizontal Canal Right   Horizontal Canal Right Duration  0    Horizontal Canal Right Symptoms Normal      Horizontal Canal Left   Horizontal Canal Left Duration 0    Horizontal Canal Left Symptoms Normal      Positional Sensitivities   Sit to Supine No dizziness    Supine to Left Side No dizziness    Supine to Right Side No dizziness    Supine to Sitting No dizziness    Nose to Right Knee No dizziness    Right Knee to Sitting No dizziness    Nose to Left Knee No dizziness    Left Knee to Sitting No dizziness    Head Turning x 5 Lightheadedness   only lasts few seconds   Head Nodding x 5 No dizziness    Pivot Right in Standing No dizziness    Pivot Left in Standing No dizziness    Rolling Right No dizziness    Rolling Left No dizziness               Balance Exercises - 10/08/21 0001       Balance Exercises: Standing   Standing Eyes Opened Narrow  base of support (BOS);Wide (BOA);Head turns;Foam/compliant surface;Limitations    Standing Eyes Opened Limitations x 10 reps with horiz/vertical narrow BOS and wide BOS. increased challene with narrow BOS    Standing Eyes Closed Wide (BOA);Foam/compliant surface;3 reps;30 secs;Limitations    Standing Eyes Closed Limitations x 30 seconds.            Initiated HEP:  Access Code: [email or web address] URL: [email or web address] Date: 10/08/2021 Prepared by: Jethro Bastos  Exercises Standing with Head Rotation on Pillow - 1 x daily - 5 x weekly - 2 sets - 10 reps Standing with Head Nod on Pillow - 1 x daily - 5 x weekly - 2 sets - 10 reps Standing Balance with Eyes Closed on Foam - 1 x daily - 5 x weekly - 1 sets - 3 reps - 30 seconds hold     PT Education - 10/08/21 0838     Education Details Updated HEP    Person(s) Educated Patient    Methods Explanation;Handout;Demonstration    Comprehension Verbalized understanding;Returned demonstration              PT Short Term Goals - 10/01/21 1040       PT SHORT TERM GOAL #1   Title Pt will be independent on HEP for strengtheing and balance to continue gains on own.    Time 4    Period Weeks    Status New    Target Date 10/29/21      PT SHORT TERM GOAL #2   Title Pt will increase FGA from 19 to >23/30 for improved balance and gait safety.    Baseline 10/01/21 19/30    Time 4    Period Weeks    Status New    Target Date 10/29/21      PT SHORT TERM GOAL #3   Title Pt will decrease 5 x sit to stand from 10.69 sec to <9 sec for improved balance and functional strength.    Baseline 10/01/21 10.69 sec from chair without hands    Time 4    Status New    Target Date 10/29/21      PT SHORT TERM GOAL #4   Title Pt will ambulate on level surfaces negotiating around objects to simulate household environment 300' independently.    Time 4    Period Weeks    Status  New    Target Date 10/29/21               PT Long  Term Goals - 10/01/21 1114       PT LONG TERM GOAL #1   Title Pt will ambulate >1000' on varied surfaces independently with no LOB for improved community mobility. (LTGs due 11/28/21)    Time 8    Period Weeks    Status New    Target Date 11/28/21      PT LONG TERM GOAL #2   Title Pt will increase FGA to >26/30 for improved gait safety and balance.    Baseline 10/01/21 19/30    Time 8    Period Weeks    Status New    Target Date 11/28/21      PT LONG TERM GOAL #3   Title Pt will be able to ambulate up/down 4 steps without rail in reciprocal pattern for improved balance and functional strength.    Time 8    Period Weeks    Status New    Target Date 11/28/21               Plan - 10/08/21 0842     Clinical Impression Statement Completed vestibular assesment during session today. Normal oculomotor exam, negative positional testing, normal DVA, and negative HIT bilaterally noted. No significant findings noted other than mild dizziness with head turns (horizontal > vertical). Rest of session focused on establishing initial HEP focused on head movement and balance on complaint surfaces.    Personal Factors and Comorbidities Comorbidity 3+;Age    Comorbidities hypertension, hyperlipidemia, palpitations., hyperthyroid.    Examination-Activity Limitations Locomotion Level;Stairs;Stand    Examination-Participation Restrictions Psychologist, forensic;Yard Work;Shop    Stability/Clinical Decision Making Stable/Uncomplicated    Rehab Potential Good    PT Frequency 2x / week    PT Duration 8 weeks   plus eval   PT Treatment/Interventions ADLs/Self Care Home Management;Canalith Repostioning;Gait training;Stair training;Functional mobility training;Therapeutic activities;DME Instruction;Therapeutic exercise;Balance training;Neuromuscular re-education;Manual techniques;Passive range of motion;Vestibular;Patient/family education    PT Next Visit Plan How was HEP? continue to progress and  add in for hip abductor strengthening and balance.    Consulted and Agree with Plan of Care Patient             Patient will benefit from skilled therapeutic intervention in order to improve the following deficits and impairments:  Abnormal gait, Decreased mobility, Decreased strength, Decreased balance, Difficulty walking  Visit Diagnosis: Difficulty in walking, not elsewhere classified  Muscle weakness (generalized)  Dizziness and giddiness  Other abnormalities of gait and mobility     Problem List There are no problems to display for this patient.   Tempie Donning, PT, DPT 10/08/2021, 8:45 AM  [facility] [address] [place], [place], [address] Phone: [phone]   Fax:  [phone]  Name: Solei Wubben MRN: [id number] Date of Birth: August 03, 1941

## 2021-10-08 NOTE — Patient Instructions (Signed)
Access Code: [email or web address] URL: [email or web address] Date: 10/08/2021 Prepared by: Jethro Bastos  Exercises Standing with Head Rotation on Pillow - 1 x daily - 5 x weekly - 2 sets - 10 reps Standing with Head Nod on Pillow - 1 x daily - 5 x weekly - 2 sets - 10 reps Standing Balance with Eyes Closed on Foam - 1 x daily - 5 x weekly - 1 sets - 3 reps - 30 seconds hold

## 2021-10-14 ENCOUNTER — Other Ambulatory Visit: Payer: Self-pay

## 2021-10-14 ENCOUNTER — Ambulatory Visit: Payer: Medicare PPO

## 2021-10-14 DIAGNOSIS — R2689 Other abnormalities of gait and mobility: Secondary | ICD-10-CM

## 2021-10-14 DIAGNOSIS — M6281 Muscle weakness (generalized): Secondary | ICD-10-CM

## 2021-10-14 DIAGNOSIS — R262 Difficulty in walking, not elsewhere classified: Secondary | ICD-10-CM

## 2021-10-14 DIAGNOSIS — R42 Dizziness and giddiness: Secondary | ICD-10-CM

## 2021-10-14 NOTE — Patient Instructions (Signed)
Access Code: [email or web address] URL: [email or web address] Date: 10/14/2021 Prepared by: Jethro Bastos  Exercises Standing with Head Rotation on Pillow - 1 x daily - 5 x weekly - 2 sets - 10 reps Standing with Head Nod on Pillow - 1 x daily - 5 x weekly - 2 sets - 10 reps Standing Balance with Eyes Closed on Foam - 1 x daily - 5 x weekly - 1 sets - 3 reps - 30 seconds hold Clamshell with Resistance - 1 x daily - 5 x weekly - 2 sets - 10 reps

## 2021-10-14 NOTE — Therapy (Signed)
[facility] [address] [place], [place], [address] Phone: [phone]   Fax:  [phone]  Physical Therapy Treatment  Patient Details  Name: Tiffany [name] MRN: [id number] Date of Birth: 01/14/1941 Referring Provider (PT): Renaye Rakers   Encounter Date: 10/14/2021   PT End of Session - 10/14/21 0844     Visit Number 3    Number of Visits 17    Date for PT Re-Evaluation 11/28/21    Authorization Type [employer] so 10th visit progress note    PT Start Time 0845    PT Stop Time 0927    PT Time Calculation (min) 42 min    Equipment Utilized During Treatment Gait belt    Activity Tolerance Patient tolerated treatment well    Behavior During Therapy [facility] for tasks assessed/performed             History reviewed. No pertinent past medical history.  History reviewed. No pertinent surgical history.  There were no vitals filed for this visit.   Subjective Assessment - 10/14/21 0845     Subjective Reports she has been doing the exercises. Just some difficulty with the eyes closed. No falls.    Pertinent History PMH:  hypertension, hyperlipidemia, palpitations., hyperthyroid. Glaucoma eye surgery.    Patient Stated Goals Pt would like to improve her steps with walking like she should.    Currently in Pain? No/denies              [facility] Adult PT Treatment/Exercise - 10/14/21 0001       Ambulation/Gait   Ambulation/Gait Yes    Ambulation/Gait Assistance 5: Supervision    Ambulation/Gait Assistance Details throughout therapy gym with activities    Assistive device None    Gait Pattern Step-through pattern;Scissoring;Narrow base of support    Ambulation Surface Level;Indoor      Exercises   Exercises Knee/Hip      Knee/Hip Exercises: Aerobic   Nustep Completed NuStep on Level 1.5 with BUE/BLE x 5 minutes for improved activity tolerance/strengthening.      Knee/Hip Exercises: Sidelying   Hip  ABduction Strengthening;Both;1 set;10 reps;Limitations    Hip ABduction Limitations increased cues required for technique/form.    Clams completed sidelying clamshells with red theraband, completed 2 x 10 reps bilaterally. increased challenge with LLE.                 Balance Exercises - 10/14/21 0001       Balance Exercises: Standing   Standing Eyes Opened Narrow base of support (BOS);Head turns;Foam/compliant surface;Limitations    Standing Eyes Opened Limitations x 10 reps EO w/ horizontal/vertical head turns. cues to slow pace of head turn,    Standing Eyes Closed Wide (BOA);Narrow base of support (BOS);Foam/compliant surface;Head turns;Limitations    Standing Eyes Closed Limitations EC x 30 seconds with narrow BOS. then w/ bil stance completed EC with horizontal/vertical head turns x 10 reps each direction.    SLS with Vectors Foam/compliant surface;Intermittent upper extremity assist;Limitations    SLS with Vectors Limitations standing on airex completed alternating toe taps to cones x 10 reps forward bilaterally, then progressed to crossover toe taps x 10 reps. increased challenge with SLS noted. Cues to stand tall. No UE support with taps forward, but require intermittent UE support and CGA with crossover toe taps.    Tandem Gait Forward;3 reps;Limitations    Tandem Gait Limitations tandem gait x 3 laps down and back without UE support, cues for technique.  Other Standing Exercises Comments intermittent rest breaks required with balance activities due to fatigue.            Reviewed and Updated HEP. Bolded are new additions to HEP. Cues required for technique.  Access Code: [email or web address] URL: [email or web address] Date: 10/14/2021 Prepared by: Jethro Bastos  Exercises Standing with Head Rotation on Pillow - 1 x daily - 5 x weekly - 2 sets - 10 reps Standing with Head Nod on Pillow - 1 x daily - 5 x weekly - 2 sets - 10 reps Standing Balance with Eyes  Closed on Foam - 1 x daily - 5 x weekly - 1 sets - 3 reps - 30 seconds hold Clamshell with Resistance - 1 x daily - 5 x weekly - 2 sets - 10 reps      PT Education - 10/14/21 0851     Education Details HEP Review; Addition to HEP    Person(s) Educated Patient    Methods Explanation;Demonstration;Handout    Comprehension Returned demonstration;Verbalized understanding              PT Short Term Goals - 10/01/21 1040       PT SHORT TERM GOAL #1   Title Pt will be independent on HEP for strengtheing and balance to continue gains on own.    Time 4    Period Weeks    Status New    Target Date 10/29/21      PT SHORT TERM GOAL #2   Title Pt will increase FGA from 19 to >23/30 for improved balance and gait safety.    Baseline 10/01/21 19/30    Time 4    Period Weeks    Status New    Target Date 10/29/21      PT SHORT TERM GOAL #3   Title Pt will decrease 5 x sit to stand from 10.69 sec to <9 sec for improved balance and functional strength.    Baseline 10/01/21 10.69 sec from chair without hands    Time 4    Status New    Target Date 10/29/21      PT SHORT TERM GOAL #4   Title Pt will ambulate on level surfaces negotiating around objects to simulate household environment 300' independently.    Time 4    Period Weeks    Status New    Target Date 10/29/21               PT Long Term Goals - 10/01/21 1114       PT LONG TERM GOAL #1   Title Pt will ambulate >1000' on varied surfaces independently with no LOB for improved community mobility. (LTGs due 11/28/21)    Time 8    Period Weeks    Status New    Target Date 11/28/21      PT LONG TERM GOAL #2   Title Pt will increase FGA to >26/30 for improved gait safety and balance.    Baseline 10/01/21 19/30    Time 8    Period Weeks    Status New    Target Date 11/28/21      PT LONG TERM GOAL #3   Title Pt will be able to ambulate up/down 4 steps without rail in reciprocal pattern for improved balance and  functional strength.    Time 8    Period Weeks    Status New    Target Date 11/28/21  Plan - 10/14/21 0845     Clinical Impression Statement Today's skilled PT session included review of HEP provided at last session, and continued to progress to patient's tolerance with focus on hip abduction strength. Increased challenge noted with SLS activities today. Will continue per POC.    Personal Factors and Comorbidities Comorbidity 3+;Age    Comorbidities hypertension, hyperlipidemia, palpitations., hyperthyroid.    Examination-Activity Limitations Locomotion Level;Stairs;Stand    Examination-Participation Restrictions Psychologist, forensic;Yard Work;Shop    Stability/Clinical Decision Making Stable/Uncomplicated    Rehab Potential Good    PT Frequency 2x / week    PT Duration 8 weeks   plus eval   PT Treatment/Interventions ADLs/Self Care Home Management;Canalith Repostioning;Gait training;Stair training;Functional mobility training;Therapeutic activities;DME Instruction;Therapeutic exercise;Balance training;Neuromuscular re-education;Manual techniques;Passive range of motion;Vestibular;Patient/family education    PT Next Visit Plan continue focus on high level balance, head turns/eyes closed. dual tasking. hip abductor strengthening. Review HEP and progress as needed.    Consulted and Agree with Plan of Care Patient             Patient will benefit from skilled therapeutic intervention in order to improve the following deficits and impairments:  Abnormal gait, Decreased mobility, Decreased strength, Decreased balance, Difficulty walking  Visit Diagnosis: Difficulty in walking, not elsewhere classified  Muscle weakness (generalized)  Other abnormalities of gait and mobility  Dizziness and giddiness     Problem List There are no problems to display for this patient.   Tempie Donning, PT, DPT 10/14/2021, 9:28 AM  [facility] [address] [place], [place], [address] Phone: [phone]   Fax:  [phone]  Name: Tiffany [name] MRN: [id number] Date of Birth: 1941/06/19

## 2021-10-16 ENCOUNTER — Ambulatory Visit: Payer: Medicare PPO

## 2021-10-16 ENCOUNTER — Other Ambulatory Visit: Payer: Self-pay

## 2021-10-16 DIAGNOSIS — R262 Difficulty in walking, not elsewhere classified: Secondary | ICD-10-CM | POA: Diagnosis not present

## 2021-10-16 DIAGNOSIS — R2689 Other abnormalities of gait and mobility: Secondary | ICD-10-CM | POA: Diagnosis not present

## 2021-10-16 DIAGNOSIS — M6281 Muscle weakness (generalized): Secondary | ICD-10-CM

## 2021-10-16 DIAGNOSIS — R42 Dizziness and giddiness: Secondary | ICD-10-CM | POA: Diagnosis not present

## 2021-10-16 NOTE — Therapy (Addendum)
[facility] [address] [place], [place], [address] Phone: [phone]   Fax:  [phone] This entire session was performed under the direct supervision and direction of a licensed physical therapist. I have personally read, edited and approve of the note as written.  Elmer Bales, PT, DPT, NCS 10/16/21    11:47 AM   Physical Therapy Treatment  Patient Details  Name: Tiffany [name] MRN: [id number] Date of Birth: Apr 21, 1941 Referring Provider (PT): Renaye Rakers   Encounter Date: 10/16/2021   PT End of Session - 10/16/21 1025     Visit Number 4    Number of Visits 17    Date for PT Re-Evaluation 11/28/21    Authorization Type [employer] so 10th visit progress note    PT Start Time 0932    PT Stop Time 1015    PT Time Calculation (min) 43 min    Equipment Utilized During Treatment Gait belt    Activity Tolerance Patient tolerated treatment well    Behavior During Therapy WFL for tasks assessed/performed             History reviewed. No pertinent past medical history.  History reviewed. No pertinent surgical history.  There were no vitals filed for this visit.   Subjective Assessment - 10/16/21 0934     Subjective Pt reports feeling so/so since the last visit. Pt states that she feels that she is still wobbly with walking. Pt reports doing her exercises and that they have been going well.    Pertinent History PMH:  hypertension, hyperlipidemia, palpitations., hyperthyroid. Glaucoma eye surgery.    Patient Stated Goals Pt would like to improve her steps with walking like she should.    Currently in Pain? No/denies                               [facility] Adult PT Treatment/Exercise - 10/16/21 0937       Transfers   Transfers Sit to Stand;Stand to Sit    Sit to Stand 7: Independent    Stand to Sit 7: Independent      Ambulation/Gait   Ambulation/Gait Yes    Ambulation/Gait  Assistance 5: Supervision    Ambulation/Gait Assistance Details 230    Assistive device None    Gait Pattern Step-through pattern;Scissoring;Narrow base of support    Ambulation Surface Level;Indoor      Neuro Re-ed    Neuro Re-ed Details  In // pt performed: 2x30 seconds of EC on airex. Pt demonstrated slight sway but did not require UE assist for balance. PT provided min guard. 2x10 of head turns w/ EC on airex. Pt demonstrated increased sway with horizontal head turns copared to vertical head turns. Pt use UE support occasionally for balance. PT provided min guard and verbal cues for tehcnique. 3 laps of reciprocal gait over low hurdles. Pt had occasional loss of balance due to scissoring gait over hurdles but was able to self-correct w/ use of // bars. PT verbally cued pt to keep legs straight while stepping over to maintain wider base of support and avoid loss of balance. Pt had increased difficutly w/ supporting herself on LLE. 3 laps of side steps over low hurdles. Pt did not require UE assist. Pt had incresaed difficutly when leading w/ LLE. PT provided min guard and verbal cues for technique.      Exercises   Exercises Other Exercises    Other  Exercises  In // pt performed: 3 laps of side steps w/ yellow theraband around ankles. PT verbally cued pt for upright posture and to control leg back. Pt had increased difficutly w/ controlling LLE compared to RLE. 2 laps of monster walks w/ yellow band around ankles. PT verbally cued pt to keep legs shoulder width apart and control the step back. Pt did not require UE assist to perform activities.                       PT Short Term Goals - 10/01/21 1040       PT SHORT TERM GOAL #1   Title Pt will be independent on HEP for strengtheing and balance to continue gains on own.    Time 4    Period Weeks    Status New    Target Date 10/29/21      PT SHORT TERM GOAL #2   Title Pt will increase FGA from 19 to >23/30 for improved  balance and gait safety.    Baseline 10/01/21 19/30    Time 4    Period Weeks    Status New    Target Date 10/29/21      PT SHORT TERM GOAL #3   Title Pt will decrease 5 x sit to stand from 10.69 sec to <9 sec for improved balance and functional strength.    Baseline 10/01/21 10.69 sec from chair without hands    Time 4    Status New    Target Date 10/29/21      PT SHORT TERM GOAL #4   Title Pt will ambulate on level surfaces negotiating around objects to simulate household environment 300' independently.    Time 4    Period Weeks    Status New    Target Date 10/29/21               PT Long Term Goals - 10/01/21 1114       PT LONG TERM GOAL #1   Title Pt will ambulate >1000' on varied surfaces independently with no LOB for improved community mobility. (LTGs due 11/28/21)    Time 8    Period Weeks    Status New    Target Date 11/28/21      PT LONG TERM GOAL #2   Title Pt will increase FGA to >26/30 for improved gait safety and balance.    Baseline 10/01/21 19/30    Time 8    Period Weeks    Status New    Target Date 11/28/21      PT LONG TERM GOAL #3   Title Pt will be able to ambulate up/down 4 steps without rail in reciprocal pattern for improved balance and functional strength.    Time 8    Period Weeks    Status New    Target Date 11/28/21                   Plan - 10/16/21 1026     Clinical Impression Statement Today's skilled PT session focused on continued improvements in BLE strength and balance. Pt continues to present w/ deficits in lower extremity strength LLE>RLE. Pt still has deficits in balance especially w/ small base of support, on complaint surfaces, and w/ EC. PT will continue to progres pt as tolerated w/ activities towards pt achievement of LTG.    Personal Factors and Comorbidities Comorbidity 3+;Age    Comorbidities hypertension, hyperlipidemia, palpitations., hyperthyroid.  Examination-Activity Limitations Locomotion  Level;Stairs;Stand    Examination-Participation Restrictions Psychologist, forensic;Yard Work;Shop    Stability/Clinical Decision Making Stable/Uncomplicated    Rehab Potential Good    PT Frequency 2x / week    PT Duration 8 weeks   plus eval   PT Treatment/Interventions ADLs/Self Care Home Management;Canalith Repostioning;Gait training;Stair training;Functional mobility training;Therapeutic activities;DME Instruction;Therapeutic exercise;Balance training;Neuromuscular re-education;Manual techniques;Passive range of motion;Vestibular;Patient/family education    PT Next Visit Plan continue focus on high level balance, head turns/eyes closed. dual tasking. hip abductor strengthening. Review HEP and progress as needed.    Consulted and Agree with Plan of Care Patient             Patient will benefit from skilled therapeutic intervention in order to improve the following deficits and impairments:  Abnormal gait, Decreased mobility, Decreased strength, Decreased balance, Difficulty walking  Visit Diagnosis: Muscle weakness (generalized)  Other abnormalities of gait and mobility     Problem List There are no problems to display for this patient.   [address], Student-PT 10/16/2021, 11:46 AM  [facility] [address] [place], [place], [address] Phone: [phone]   Fax:  [phone]  Name: Ashanty Coltrane MRN: [id number] Date of Birth: 09/06/1941

## 2021-10-22 ENCOUNTER — Other Ambulatory Visit: Payer: Self-pay

## 2021-10-22 ENCOUNTER — Ambulatory Visit: Payer: Medicare PPO

## 2021-10-22 DIAGNOSIS — M6281 Muscle weakness (generalized): Secondary | ICD-10-CM

## 2021-10-22 DIAGNOSIS — R42 Dizziness and giddiness: Secondary | ICD-10-CM

## 2021-10-22 DIAGNOSIS — R2689 Other abnormalities of gait and mobility: Secondary | ICD-10-CM | POA: Diagnosis not present

## 2021-10-22 DIAGNOSIS — R262 Difficulty in walking, not elsewhere classified: Secondary | ICD-10-CM

## 2021-10-22 NOTE — Patient Instructions (Signed)
Access Code: [email or web address] URL: [email or web address] Date: 10/22/2021 Prepared by: Jethro Bastos  Exercises Standing with Head Rotation on Pillow - 1 x daily - 5 x weekly - 2 sets - 10 reps Standing with Head Nod on Pillow - 1 x daily - 5 x weekly - 2 sets - 10 reps Standing Balance with Eyes Closed on Foam - 1 x daily - 5 x weekly - 1 sets - 3 reps - 30 seconds hold Clamshell with Resistance - 1 x daily - 5 x weekly - 2 sets - 10 reps Single Leg Stance - 1 x daily - 5 x weekly - 1 sets - 3 reps - 15-20 seconds hold

## 2021-10-22 NOTE — Therapy (Signed)
[facility] [address] [place], [place], [address] Phone: [phone]   Fax:  [phone]  Physical Therapy Treatment  Patient Details  Name: Tiffany [name] MRN: [id number] Date of Birth: 1941/01/10 Referring Provider (PT): Renaye Rakers   Encounter Date: 10/22/2021   PT End of Session - 10/22/21 0848     Visit Number 5    Number of Visits 17    Date for PT Re-Evaluation 11/28/21    Authorization Type [employer] medic[employer] so 10th visit progress note    PT Start Time 0846    PT Stop Time 0928    PT Time Calculation (min) 42 min    Equipment Utilized During Treatment Gait belt    Activity Tolerance Patient tolerated treatment well    Behavior During Therapy [facility] for tasks assessed/performed             History reviewed. No pertinent past medical history.  History reviewed. No pertinent surgical history.  There were no vitals filed for this visit.   Subjective Assessment - 10/22/21 0845     Subjective Patient reports exercises are still giving her a challenge. Continues to feel not good with the walking. No other changes. No falls.    Pertinent History PMH:  hypertension, hyperlipidemia, palpitations., hyperthyroid. Glaucoma eye surgery.    Patient Stated Goals Pt would like to improve her steps with walking like she should.    Currently in Pain? No/denies               Balance Exercises - 10/22/21 0001       Balance Exercises: Standing   Standing Eyes Opened Narrow base of support (BOS);Head turns;Foam/compliant surface;Limitations    Standing Eyes Opened Limitations x 10 reps EO w/ horizontal/vertical head turns. cues to slow pace of head turn    Standing Eyes Closed Wide (BOA);Narrow base of support (BOS);Foam/compliant surface;Head turns;Limitations    Standing Eyes Closed Limitations EC x 30 seconds with narrow BOS. then w/ bil stance completed EC with horizontal/vertical head turns x 10 reps each  direction.    Tandem Stance Eyes open;Foam/compliant surface;2 reps;30 secs;Limitations    Tandem Stance Time 2 x30 seconds full tandem; then completed tandem with additional of horizontal/vertical head turns x 10 reps, alternating foot position with each rep.    SLS Eyes open;Solid surface;3 reps;Time    SLS Time 10-15 secs on solid surface. added to HEP. handout provided    Retro Gait 3 reps;Limitations    Retro Gait Limitations x 3 laps retro/backwards walking, pt demo very narow BOS requiring cues for widen BOS as almost completing tandem walking retro with backwards walking.    Sidestepping Foam/compliant support;Limitations    Sidestepping Limitations completed side stepping x 3 laps down and back on blue beam, close supervison. cues for step length.    Step Over Hurdles / Cones ambulation forward with toe tap to cones to promote SLS followed by step over x 4 laps. increased challenge with this activity, requiring CGA from PT and intermittent UE support. Progressed to forward ambulation with step over black hurdles and additoin of gait with horizontal head turns x 25' during straight away. Completed x 3 laps down and back.    Marching Solid surface;Forwards;Limitations    Marching Limitations x 3 laps forward alternating marching with ambulation. CGA            Updated HEP:  Access Code: [email or web address] URL: [email or web address] Date: 10/22/2021 Prepared by: Jethro Bastos  Exercises Standing with  Head Rotation on Pillow - 1 x daily - 5 x weekly - 2 sets - 10 reps Standing with Head Nod on Pillow - 1 x daily - 5 x weekly - 2 sets - 10 reps Standing Balance with Eyes Closed on Foam - 1 x daily - 5 x weekly - 1 sets - 3 reps - 30 seconds hold Clamshell with Resistance - 1 x daily - 5 x weekly - 2 sets - 10 reps Single Leg Stance - 1 x daily - 5 x weekly - 1 sets - 3 reps - 15-20 seconds hold     PT Education - 10/22/21 0927     Education Details HEP addition     Person(s) Educated Patient    Methods Explanation;Demonstration;Handout    Comprehension Returned demonstration;Verbalized understanding              PT Short Term Goals - 10/01/21 1040       PT SHORT TERM GOAL #1   Title Pt will be independent on HEP for strengtheing and balance to continue gains on own.    Time 4    Period Weeks    Status New    Target Date 10/29/21      PT SHORT TERM GOAL #2   Title Pt will increase FGA from 19 to >23/30 for improved balance and gait safety.    Baseline 10/01/21 19/30    Time 4    Period Weeks    Status New    Target Date 10/29/21      PT SHORT TERM GOAL #3   Title Pt will decrease 5 x sit to stand from 10.69 sec to <9 sec for improved balance and functional strength.    Baseline 10/01/21 10.69 sec from chair without hands    Time 4    Status New    Target Date 10/29/21      PT SHORT TERM GOAL #4   Title Pt will ambulate on level surfaces negotiating around objects to simulate household environment 300' independently.    Time 4    Period Weeks    Status New    Target Date 10/29/21               PT Long Term Goals - 10/01/21 1114       PT LONG TERM GOAL #1   Title Pt will ambulate >1000' on varied surfaces independently with no LOB for improved community mobility. (LTGs due 11/28/21)    Time 8    Period Weeks    Status New    Target Date 11/28/21      PT LONG TERM GOAL #2   Title Pt will increase FGA to >26/30 for improved gait safety and balance.    Baseline 10/01/21 19/30    Time 8    Period Weeks    Status New    Target Date 11/28/21      PT LONG TERM GOAL #3   Title Pt will be able to ambulate up/down 4 steps without rail in reciprocal pattern for improved balance and functional strength.    Time 8    Period Weeks    Status New    Target Date 11/28/21                   Plan - 10/22/21 0943     Clinical Impression Statement Today's skilled PT session focused on continued high level balance  actvities, adding in more activites with ambulation. Patient continue to  demo increased challenge with SLS activites including LLE > RLE, added to HEP. Will continue to progress toward LTGs.    Personal Factors and Comorbidities Comorbidity 3+;Age    Comorbidities hypertension, hyperlipidemia, palpitations., hyperthyroid.    Examination-Activity Limitations Locomotion Level;Stairs;Stand    Examination-Participation Restrictions Psychologist, forensic;Yard Work;Shop    Stability/Clinical Decision Making Stable/Uncomplicated    Rehab Potential Good    PT Frequency 2x / week    PT Duration 8 weeks   plus eval   PT Treatment/Interventions ADLs/Self Care Home Management;Canalith Repostioning;Gait training;Stair training;Functional mobility training;Therapeutic activities;DME Instruction;Therapeutic exercise;Balance training;Neuromuscular re-education;Manual techniques;Passive range of motion;Vestibular;Patient/family education    PT Next Visit Plan LS activities. LLE strengthening. Dual Tasking. continue focus on high level balance, head turns/eyes closed. dual tasking. hip abductor strengthening. Review HEP and progress as needed.    Consulted and Agree with Plan of Care Patient             Patient will benefit from skilled therapeutic intervention in order to improve the following deficits and impairments:  Abnormal gait, Decreased mobility, Decreased strength, Decreased balance, Difficulty walking  Visit Diagnosis: Muscle weakness (generalized)  Other abnormalities of gait and mobility  Difficulty in walking, not elsewhere classified  Dizziness and giddiness     Problem List There are no problems to display for this patient.   Tempie Donning, PT, DPT 10/22/2021, 10:21 AM  [facility] [address] [place], [place], [address] Phone: [phone]   Fax:  [phone]  Name: Tiffany [name] MRN:  [id number] Date of Birth: [birth year]

## 2021-10-23 ENCOUNTER — Ambulatory Visit: Payer: Medicare PPO

## 2021-10-23 DIAGNOSIS — R2689 Other abnormalities of gait and mobility: Secondary | ICD-10-CM

## 2021-10-23 DIAGNOSIS — M6281 Muscle weakness (generalized): Secondary | ICD-10-CM | POA: Diagnosis not present

## 2021-10-23 DIAGNOSIS — R262 Difficulty in walking, not elsewhere classified: Secondary | ICD-10-CM | POA: Diagnosis not present

## 2021-10-23 DIAGNOSIS — R42 Dizziness and giddiness: Secondary | ICD-10-CM | POA: Diagnosis not present

## 2021-10-23 NOTE — Therapy (Signed)
[facility] [address] [place], [place], [address] Phone: [phone]   Fax:  [phone]  Physical Therapy Treatment  Patient Details  Name: Tiffany [name] MRN: [id number] Date of Birth: [birth year] Referring Provider (PT): Renaye Rakers   Encounter Date: 10/23/2021   PT End of Session - 10/23/21 0934     Visit Number 6    Number of Visits 17    Date for PT Re-Evaluation 11/28/21    Authorization Type [employer] medic[employer] so 10th visit progress note    PT Start Time 0932    PT Stop Time 1013    PT Time Calculation (min) 41 min    Equipment Utilized During Treatment Gait belt    Activity Tolerance Patient tolerated treatment well    Behavior During Therapy [facility] for tasks assessed/performed             History reviewed. No pertinent past medical history.  History reviewed. No pertinent surgical history.  There were no vitals filed for this visit.   Subjective Assessment - 10/23/21 0934     Subjective No new changes/complaints. Reports single leg standing is challenging. No falls. No pain.    Pertinent History PMH:  hypertension, hyperlipidemia, palpitations., hyperthyroid. Glaucoma eye surgery.    Patient Stated Goals Pt would like to improve her steps with walking like she should.    Currently in Pain? No/denies               [facility] Adult PT Treatment/Exercise - 10/23/21 0001       Ambulation/Gait   Ambulation/Gait Yes    Ambulation/Gait Assistance 5: Supervision    Ambulation/Gait Assistance Details with high level balance activities.    Assistive device None    Gait Pattern Step-through pattern;Scissoring;Narrow base of support    Ambulation Surface Level;Indoor      High Level Balance   High Level Balance Activities Other (comment);Turns    High Level Balance Comments Completed ambulation forward and backwards with self ball toss 4 x 25' for dual tasking, then x 230 ft ocmpleted self ball toss  with addition of dual tasking naming fruit/vegetable that started with letter of alphabet. Then with forward ambulation, completed quick turns to R/L and stopping in place, completed x 8 reps.                 Balance Exercises - 10/23/21 0001       Balance Exercises: Standing   SLS Eyes open;Solid surface;3 reps;Time    SLS Time 10-15 secs on solid surface. More challenge on LLE > RLE    SLS with Vectors Foam/compliant surface;Intermittent upper extremity assist;Limitations    SLS with Vectors Limitations standing on airex completed alternating toe taps to cones x 10 reps forward bilaterally, then progressed to crossover toe taps x 10 reps. increased challenge with SLS noted. cues for slkow and controlled pace, as more imbalance with incrased speed. CGA    Stepping Strategy Anterior;Posterior;Foam/compliant surface;Limitations    Stepping Strategy Limitations standing across blue balance beam completed anterior/posterior stepping strategy x 10 reps bilat, cues for step length and proper technique.    Rockerboard Anterior/posterior;EO;Head turns;Intermittent UE support;Limitations    Rockerboard Limitations standing on rockerboard A/P: maintaining board steady x 30 seconds with EO, then completed 2 x 10 reps of horizontal and vertical head turns. CGA required and cues to stand tall as increased posterior lean noted.    Tandem Gait Forward;Retro;Intermittent upper extremity support;Foam/compliant surface;Limitations    Tandem Gait Limitations  forward/retro tandem on firm surface x 2 laps down and back, then progressed to forward tandem on blue balance beam x 2 laps down and back. intermittne touch A on foam surface.                  PT Short Term Goals - 10/01/21 1040       PT SHORT TERM GOAL #1   Title Pt will be independent on HEP for strengtheing and balance to continue gains on own.    Time 4    Period Weeks    Status New    Target Date 10/29/21      PT SHORT TERM GOAL  #2   Title Pt will increase FGA from 19 to >23/30 for improved balance and gait safety.    Baseline 10/01/21 19/30    Time 4    Period Weeks    Status New    Target Date 10/29/21      PT SHORT TERM GOAL #3   Title Pt will decrease 5 x sit to stand from 10.69 sec to <9 sec for improved balance and functional strength.    Baseline 10/01/21 10.69 sec from chair without hands    Time 4    Status New    Target Date 10/29/21      PT SHORT TERM GOAL #4   Title Pt will ambulate on level surfaces negotiating around objects to simulate household environment 300' independently.    Time 4    Period Weeks    Status New    Target Date 10/29/21               PT Long Term Goals - 10/01/21 1114       PT LONG TERM GOAL #1   Title Pt will ambulate >1000' on varied surfaces independently with no LOB for improved community mobility. (LTGs due 11/28/21)    Time 8    Period Weeks    Status New    Target Date 11/28/21      PT LONG TERM GOAL #2   Title Pt will increase FGA to >26/30 for improved gait safety and balance.    Baseline 10/01/21 19/30    Time 8    Period Weeks    Status New    Target Date 11/28/21      PT LONG TERM GOAL #3   Title Pt will be able to ambulate up/down 4 steps without rail in reciprocal pattern for improved balance and functional strength.    Time 8    Period Weeks    Status New    Target Date 11/28/21                   Plan - 10/23/21 1014     Clinical Impression Statement Today's session fociused on SLS activities (LLE > RLE), stepping and balance strategies, and dual tasking. patient demo decreased use of balance strategies requiring intermittent CGA and UE support. Will continue per POC.    Personal Factors and Comorbidities Comorbidity 3+;Age    Comorbidities hypertension, hyperlipidemia, palpitations., hyperthyroid.    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 SLS activities. LLE strengthening. Dual Tasking. continue focus on high level balance, head turns/eyes closed. dual tasking. hip abductor strengthening. Review HEP and progress as needed.    Consulted and Agree with Plan of Care Patient             Patient will benefit from skilled therapeutic intervention in order to improve the following deficits and impairments:  Abnormal gait, Decreased mobility, Decreased strength, Decreased balance, Difficulty walking  Visit Diagnosis: Muscle weakness (generalized)  Other abnormalities of gait and mobility  Difficulty in walking, not elsewhere classified  Dizziness and giddiness     Problem List There are no problems to display for this patient.   Tempie Donning, PT, DPT 10/23/2021, 10:15 AM  [facility] [address] [place], [place], [address] Phone: [phone]   Fax:  [phone]  Name: Tiffany [name] MRN: [id number] Date of Birth: 03-Nov-1940

## 2021-10-29 ENCOUNTER — Other Ambulatory Visit: Payer: Self-pay

## 2021-10-29 ENCOUNTER — Ambulatory Visit: Payer: Medicare PPO

## 2021-10-29 DIAGNOSIS — M6281 Muscle weakness (generalized): Secondary | ICD-10-CM | POA: Diagnosis not present

## 2021-10-29 DIAGNOSIS — R2689 Other abnormalities of gait and mobility: Secondary | ICD-10-CM | POA: Diagnosis not present

## 2021-10-29 DIAGNOSIS — R262 Difficulty in walking, not elsewhere classified: Secondary | ICD-10-CM | POA: Diagnosis not present

## 2021-10-29 DIAGNOSIS — R42 Dizziness and giddiness: Secondary | ICD-10-CM | POA: Diagnosis not present

## 2021-10-29 NOTE — Therapy (Signed)
[facility] [address] 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 No pertinent past medical history.  History reviewed. No pertinent surgical history.  There were no vitals filed for this visit.   Subjective Assessment - 10/29/21 0928     Subjective Pt reports doing ok. Did wake up yesterday morning with some left wrist pain but not sure why. She was not able to do the single leg stance activity as was too hard at home.    Pertinent History PMH:  hypertension, hyperlipidemia, palpitations., hyperthyroid. Glaucoma eye surgery.    Patient Stated Goals Pt would like to improve her steps with walking like she should.    Currently in Pain? No/denies                [facility] PT Assessment - 10/29/21 0929       Functional Gait  Assessment   Gait assessed  Yes    Gait Level Surface Walks 20 ft in less than 7 sec but greater than 5.5 sec, uses assistive device, slower speed, mild gait deviations, or deviates 6-10 in outside of the 12 in walkway width.    Change in Gait Speed Able to smoothly change walking speed without loss of balance or gait deviation. Deviate no more than 6 in outside of the 12 in walkway width.    Gait with Horizontal Head Turns Performs head  turns smoothly with no change in gait. Deviates no more than 6 in outside 12 in walkway width    Gait with Vertical Head Turns Performs head turns with no change in gait. Deviates no more than 6 in outside 12 in walkway width.    Gait and Pivot Turn Pivot turns safely in greater than 3 sec and stops with no loss of balance, or pivot turns safely within 3 sec and stops with mild imbalance, requires small steps to catch balance.    Step Over Obstacle Is able to step over one shoe box (4.5 in total height) without changing gait speed. No evidence of imbalance.    Gait with Narrow Base of Support Is able to ambulate for 10 steps heel to toe with no staggering.    Gait with Eyes Closed Walks 20 ft, slow speed, abnormal gait pattern, evidence for imbalance, deviates 10-15 in outside 12 in walkway width. Requires more than 9 sec to ambulate 20 ft.    Ambulating Backwards Walks 20 ft, uses assistive device, slower speed, mild gait deviations, deviates 6-10 in outside 12 in walkway width.    Steps Alternating feet, must use rail.    Total Score 23  [place] Adult PT Treatment/Exercise - 10/29/21 0929       Transfers   Transfers Sit to Stand;Stand to Sit    Sit to Stand 7: Independent    Five time sit to stand comments  8.86 sec from chair without hands    Stand to Sit 7: Independent      Ambulation/Gait   Ambulation/Gait Yes    Ambulation/Gait Assistance 5: Supervision    Ambulation/Gait Assistance Details Pt was cued to try to widen her steps some. Did see some improvement after working on monster steps but tends to narrow when turning.    Ambulation Distance (Feet) 115 Feet   x 2   Assistive device None    Gait Pattern Step-through pattern;Narrow base of support    Ambulation Surface Level;Indoor      Neuro Re-ed    Neuro Re-ed Details  Dynamic gait activities: weaving in and out of 5 cones, reciprocal stepping over 4 low orange hurdles, walking narrow  path between 2 bolsters and marching over blue mat x 4 laps SBA with verbal cues to focus on foot placement.      Exercises   Exercises Other Exercises    Other Exercises  PT reviewed medbridge HEP as noted below and added monster walking with red theraband. Performed monster walking x 115'.             Exercises Standing with Head Rotation on Pillow - 1 x daily - 5 x weekly - 2 sets - 10 reps Standing with Head Nod on Pillow - 1 x daily - 5 x weekly - 2 sets - 10 reps Standing Balance with Eyes Closed on Foam - 1 x daily - 5 x weekly - 1 sets - 3 reps - 30 seconds hold Clamshell with Resistance - 1 x daily - 5 x weekly - 2 sets - 10 reps Single Leg Stance - 1 x daily - 5 x weekly - 1 sets - 3 reps - 15-20 seconds hold- added fingertip support for safety Forward Monster Walks - 2 x daily - 5 x weekly - 1 sets - 3-4 reps        PT Education - 10/29/21 1905     Education Details results of testing. Reviewed HEP and updated    Person(s) Educated Patient    Methods Explanation;Demonstration;Handout    Comprehension Verbalized understanding              PT Short Term Goals - 10/29/21 0939       PT SHORT TERM GOAL #1   Title Pt will be independent on HEP for strengtheing and balance to continue gains on own.    Baseline 10/29/21 independent with current HEP    Time 4    Period Weeks    Status Achieved    Target Date 10/29/21      PT SHORT TERM GOAL #2   Title Pt will increase FGA from 19 to >23/30 for improved balance and gait safety.    Baseline 10/01/21 19/30, 10/29/21 23/30    Time 4    Period Weeks    Status Partially Met    Target Date 10/29/21      PT SHORT TERM GOAL #3   Title Pt will decrease 5 x sit to stand from 10.69 sec to <9 sec for improved balance and functional strength.    Baseline 10/01/21 10.69 sec from chair without hands, 10/29/21 8.86 sec    Time 4    Status Achieved  Target Date 10/29/21      PT SHORT TERM GOAL #4   Title Pt will  ambulate on level surfaces negotiating around objects to simulate household environment 300' independently.    Baseline 10/29/21 supervision when negotiating around objects    Time 4    Period Weeks    Status Partially Met    Target Date 10/29/21               PT Long Term Goals - 10/01/21 1114       PT LONG TERM GOAL #1   Title Pt will ambulate >1000' on varied surfaces independently with no LOB for improved community mobility. (LTGs due 11/28/21)    Time 8    Period Weeks    Status New    Target Date 11/28/21      PT LONG TERM GOAL #2   Title Pt will increase FGA to >26/30 for improved gait safety and balance.    Baseline 10/01/21 19/30    Time 8    Period Weeks    Status New    Target Date 11/28/21      PT LONG TERM GOAL #3   Title Pt will be able to ambulate up/down 4 steps without rail in reciprocal pattern for improved balance and functional strength.    Time 8    Period Weeks    Status New    Target Date 11/28/21                   Plan - 10/29/21 1907     Clinical Impression Statement PT assessed STGs today with patient showing progress towards all goals. She met 5 x sit to stand goal indicating improving balance and functional strength. She increased FGA to 23/30 which was just short of goal and still in moderate fall risk category. Continues to work on improving her gait quality with focus on widening BOS especially with turning. Pt continues to benefit from skilled PT to continue to progress towards goals.    Personal Factors and Comorbidities Comorbidity 3+;Age    Comorbidities hypertension, hyperlipidemia, palpitations., hyperthyroid.    Examination-Activity Limitations Locomotion Level;Stairs;Stand    Examination-Participation Restrictions Art gallery manager;Yard Work;Shop    Stability/Clinical Decision Making Stable/Uncomplicated    Rehab Potential Good    PT Frequency 2x / week    PT Duration 8 weeks   plus eval   PT  Treatment/Interventions ADLs/Self Care Home Management;Canalith Repostioning;Gait training;Stair training;Functional mobility training;Therapeutic activities;DME Instruction;Therapeutic exercise;Balance training;Neuromuscular re-education;Manual techniques;Passive range of motion;Vestibular;Patient/family education    PT Next Visit Plan SLS activities. LLE strengthening. Dual Tasking. continue focus on high level balance, head turns/eyes closed. dual tasking. hip abductor strengthening. Review HEP and progress as needed.    Consulted and Agree with Plan of Care Patient             Patient will benefit from skilled therapeutic intervention in order to improve the following deficits and impairments:  Abnormal gait, Decreased mobility, Decreased strength, Decreased balance, Difficulty walking  Visit Diagnosis: Other abnormalities of gait and mobility  Muscle weakness (generalized)     Problem List There are no problems to display for this patient.   Electa Sniff, PT, DPT, NCS 10/29/2021, 7:10 PM  [facility] [address] [place], [place], [address] Phone: [phone]   Fax:  [phone]  Name: Lively Haberman MRN: [id number] Date of Birth: 01-12-1941

## 2021-10-29 NOTE — Patient Instructions (Addendum)
Access Code: [email or web address] URL: [email or web address] Date: 10/29/2021 Prepared by: Elmer Bales  Exercises Standing with Head Rotation on Pillow - 1 x daily - 5 x weekly - 2 sets - 10 reps Standing with Head Nod on Pillow - 1 x daily - 5 x weekly - 2 sets - 10 reps Standing Balance with Eyes Closed on Foam - 1 x daily - 5 x weekly - 1 sets - 3 reps - 30 seconds hold Clamshell with Resistance - 1 x daily - 5 x weekly - 2 sets - 10 reps Single Leg Stance - 1 x daily - 5 x weekly - 1 sets - 3 reps - 15-20 seconds hold- added fingertip support for safety Forward Monster Walks - 2 x daily - 5 x weekly - 1 sets - 3-4 reps

## 2021-10-30 ENCOUNTER — Ambulatory Visit: Payer: Medicare PPO

## 2021-10-30 DIAGNOSIS — R262 Difficulty in walking, not elsewhere classified: Secondary | ICD-10-CM | POA: Diagnosis not present

## 2021-10-30 DIAGNOSIS — R2689 Other abnormalities of gait and mobility: Secondary | ICD-10-CM | POA: Diagnosis not present

## 2021-10-30 DIAGNOSIS — R42 Dizziness and giddiness: Secondary | ICD-10-CM | POA: Diagnosis not present

## 2021-10-30 DIAGNOSIS — M6281 Muscle weakness (generalized): Secondary | ICD-10-CM

## 2021-10-30 NOTE — Therapy (Signed)
[facility] [address] [place], [place], [address] Phone: [phone]   Fax:  [phone]  Physical Therapy Treatment  Patient Details  Name: Tiffany [name] MRN: [id number] Date of Birth: 10/09/1941 Referring Provider (PT): Lucianne Lei   Encounter Date: 10/30/2021   PT End of Session - 10/30/21 1024     Visit Number 8    Number of Visits 17    Date for PT Re-Evaluation 11/28/21    Authorization Type [employer] so 10th visit progress note    PT Start Time 1022   PT running behind   PT Stop Time 1058    PT Time Calculation (min) 36 min    Equipment Utilized During Treatment Gait belt    Activity Tolerance Patient tolerated treatment well    Behavior During Therapy [facility] for tasks assessed/performed             History reviewed. No pertinent past medical history.  History reviewed. No pertinent surgical history.  There were no vitals filed for this visit.   Subjective Assessment - 10/30/21 1025     Subjective Pt denies any changes. She reports the SLS was a little easier when performed with just fingertip support as suggested yesterday.    Pertinent History PMH:  hypertension, hyperlipidemia, palpitations., hyperthyroid. Glaucoma eye surgery.    Patient Stated Goals Pt would like to improve her steps with walking like she should.    Currently in Pain? No/denies                               [facility] Adult PT Treatment/Exercise - 10/30/21 1025       Ambulation/Gait   Ambulation/Gait Yes    Ambulation/Gait Assistance 5: Supervision    Ambulation/Gait Assistance Details around in clinic during session    Assistive device None    Gait Pattern Step-through pattern;Narrow base of support    Ambulation Surface Level;Indoor      Neuro Re-ed    Neuro Re-ed Details  Dynamic gait activities in hallway: marching gait 40' x 2 with cues to try to control SLS more. Improved stability as went  on. Tandem gait 40' x 2, monster steps with RTB around thighs 40' x 2, side stepping with red theraband 40' x 2, gait with gait turns left/right 40' x 4 with occasional stagger but did improve some as went on. Close SBA/CGA as needed for safety. In // bars: SLS with other foot on soccerball 20 sec x 2 each leg, alternating tapping soccerball x 10 CGA.                       PT Short Term Goals - 10/29/21 0939       PT SHORT TERM GOAL #1   Title Pt will be independent on HEP for strengtheing and balance to continue gains on own.    Baseline 10/29/21 independent with current HEP    Time 4    Period Weeks    Status Achieved    Target Date 10/29/21      PT SHORT TERM GOAL #2   Title Pt will increase FGA from 19 to >23/30 for improved balance and gait safety.    Baseline 10/01/21 19/30, 10/29/21 23/30    Time 4    Period Weeks    Status Partially Met    Target Date 10/29/21      PT SHORT TERM GOAL #  3   Title Pt will decrease 5 x sit to stand from 10.69 sec to <9 sec for improved balance and functional strength.    Baseline 10/01/21 10.69 sec from chair without hands, 10/29/21 8.86 sec    Time 4    Status Achieved    Target Date 10/29/21      PT SHORT TERM GOAL #4   Title Pt will ambulate on level surfaces negotiating around objects to simulate household environment 300' independently.    Baseline 10/29/21 supervision when negotiating around objects    Time 4    Period Weeks    Status Partially Met    Target Date 10/29/21               PT Long Term Goals - 10/01/21 1114       PT LONG TERM GOAL #1   Title Pt will ambulate >1000' on varied surfaces independently with no LOB for improved community mobility. (LTGs due 11/28/21)    Time 8    Period Weeks    Status New    Target Date 11/28/21      PT LONG TERM GOAL #2   Title Pt will increase FGA to >26/30 for improved gait safety and balance.    Baseline 10/01/21 19/30    Time 8    Period Weeks    Status  New    Target Date 11/28/21      PT LONG TERM GOAL #3   Title Pt will be able to ambulate up/down 4 steps without rail in reciprocal pattern for improved balance and functional strength.    Time 8    Period Weeks    Status New    Target Date 11/28/21                   Plan - 10/30/21 1951     Clinical Impression Statement PT continued to work on dynamic gait activities as well as increasing SLS time. Pt is challenged with this. She did show improvement during session with practice on each activity.    Personal Factors and Comorbidities Comorbidity 3+;Age    Comorbidities hypertension, hyperlipidemia, palpitations., hyperthyroid.    Examination-Activity Limitations Locomotion Level;Stairs;Stand    Examination-Participation Restrictions Art gallery manager;Yard Work;Shop    Stability/Clinical Decision Making Stable/Uncomplicated    Rehab Potential Good    PT Frequency 2x / week    PT Duration 8 weeks   plus eval   PT Treatment/Interventions ADLs/Self Care Home Management;Canalith Repostioning;Gait training;Stair training;Functional mobility training;Therapeutic activities;DME Instruction;Therapeutic exercise;Balance training;Neuromuscular re-education;Manual techniques;Passive range of motion;Vestibular;Patient/family education    PT Next Visit Plan SLS activities. LLE strengthening. Dual Tasking. continue focus on high level balance, head turns/eyes closed. dual tasking. hip abductor strengthening. Review HEP and progress as needed.    Consulted and Agree with Plan of Care Patient             Patient will benefit from skilled therapeutic intervention in order to improve the following deficits and impairments:  Abnormal gait, Decreased mobility, Decreased strength, Decreased balance, Difficulty walking  Visit Diagnosis: Other abnormalities of gait and mobility  Muscle weakness (generalized)     Problem List There are no problems to display for this  patient.   Electa Sniff, PT, DPT, NCS 10/30/2021, 7:53 PM  [facility] [address] [place], [place], [address] Phone: [phone]   Fax:  [phone]  Name: Tiffany [name] MRN: [id number] Date of Birth: 21-Mar-1941

## 2021-11-04 ENCOUNTER — Ambulatory Visit: Payer: Medicare PPO

## 2021-11-05 ENCOUNTER — Ambulatory Visit: Payer: Medicare PPO | Attending: Family Medicine | Admitting: Rehabilitation

## 2021-11-05 ENCOUNTER — Encounter: Payer: Self-pay | Admitting: Rehabilitation

## 2021-11-05 ENCOUNTER — Other Ambulatory Visit: Payer: Self-pay

## 2021-11-05 DIAGNOSIS — R42 Dizziness and giddiness: Secondary | ICD-10-CM | POA: Insufficient documentation

## 2021-11-05 DIAGNOSIS — R2689 Other abnormalities of gait and mobility: Secondary | ICD-10-CM | POA: Diagnosis not present

## 2021-11-05 DIAGNOSIS — R262 Difficulty in walking, not elsewhere classified: Secondary | ICD-10-CM | POA: Insufficient documentation

## 2021-11-05 DIAGNOSIS — M6281 Muscle weakness (generalized): Secondary | ICD-10-CM | POA: Diagnosis not present

## 2021-11-05 NOTE — Therapy (Signed)
[facility] [address] [place], [place], [address] Phone: [phone]   Fax:  [phone]  Physical Therapy Treatment  Patient Details  Name: Tiffany [name] MRN: [id number] Date of Birth: 1941/10/21 Referring Provider (PT): Lucianne Lei   Encounter Date: 11/05/2021   PT End of Session - 11/05/21 1318     Visit Number 9    Number of Visits 17    Date for PT Re-Evaluation 11/28/21    Authorization Type [employer] so 10th visit progress note    PT Start Time 1102    PT Stop Time 1145    PT Time Calculation (min) 43 min    Equipment Utilized During Treatment Gait belt    Activity Tolerance Patient tolerated treatment well    Behavior During Therapy [facility] for tasks assessed/performed             History reviewed. No pertinent past medical history.  History reviewed. No pertinent surgical history.  There were no vitals filed for this visit.   Subjective Assessment - 11/05/21 1105     Subjective Pt reports no changes, did have some neck pain yesterday but is better today.    Pertinent History PMH:  hypertension, hyperlipidemia, palpitations., hyperthyroid. Glaucoma eye surgery.    Patient Stated Goals Pt would like to improve her steps with walking like she should.    Currently in Pain? No/denies                               [facility] Adult PT Treatment/Exercise - 11/05/21 1108       Ambulation/Gait   Ambulation/Gait Yes    Ambulation/Gait Assistance 5: Supervision;4: Min assist    Ambulation/Gait Assistance Details Gait during session at S level with min cues for slightly wider BOS.  Did perform resisted gait during session with cues for improved propulsion.  PT provided random perturbations to address reactionary balance.  She does cross midline with lateral LOB, but is able to self correct most of time, did need min A x 2 reps.    Ambulation Distance (Feet) 100 Feet   then 115' x 2  reps resisted   Assistive device None    Gait Pattern Step-through pattern;Narrow base of support    Ambulation Surface Level;Indoor      Neuro Re-ed    Neuro Re-ed Details  Continue high level balane with emphasis on SLS and compliant surfaces with alt cone taps standing on foam airex x 20 reps with single to fingertip touch support, alt lateral cone taps standing on airex x 20 reps again with light fingertip touch support. Continued on ramp facing laterally standing on blue mat performing lateral lunge to floor and back to mat for both SLS and hip abd strength x 10 reps each direction.      Exercises   Exercises Other Exercises    Other Exercises  Lateral step ups to 6" step, while on step marching each leg then stepping down to opposite side x 10 reps total.  Pt very fatigued following this and ramp exercise.      Knee/Hip Exercises: Aerobic   Stepper Seated scifit x 8 mins with BUEs/LEs at level 3 resistance for overall strengthening and cardiovascular endurance.                       PT Short Term Goals - 10/29/21 6962  PT SHORT TERM GOAL #1   Title Pt will be independent on HEP for strengtheing and balance to continue gains on own.    Baseline 10/29/21 independent with current HEP    Time 4    Period Weeks    Status Achieved    Target Date 10/29/21      PT SHORT TERM GOAL #2   Title Pt will increase FGA from 19 to >23/30 for improved balance and gait safety.    Baseline 10/01/21 19/30, 10/29/21 23/30    Time 4    Period Weeks    Status Partially Met    Target Date 10/29/21      PT SHORT TERM GOAL #3   Title Pt will decrease 5 x sit to stand from 10.69 sec to <9 sec for improved balance and functional strength.    Baseline 10/01/21 10.69 sec from chair without hands, 10/29/21 8.86 sec    Time 4    Status Achieved    Target Date 10/29/21      PT SHORT TERM GOAL #4   Title Pt will ambulate on level surfaces negotiating around objects to simulate  household environment 300' independently.    Baseline 10/29/21 supervision when negotiating around objects    Time 4    Period Weeks    Status Partially Met    Target Date 10/29/21               PT Long Term Goals - 10/01/21 1114       PT LONG TERM GOAL #1   Title Pt will ambulate >1000' on varied surfaces independently with no LOB for improved community mobility. (LTGs due 11/28/21)    Time 8    Period Weeks    Status New    Target Date 11/28/21      PT LONG TERM GOAL #2   Title Pt will increase FGA to >26/30 for improved gait safety and balance.    Baseline 10/01/21 19/30    Time 8    Period Weeks    Status New    Target Date 11/28/21      PT LONG TERM GOAL #3   Title Pt will be able to ambulate up/down 4 steps without rail in reciprocal pattern for improved balance and functional strength.    Time 8    Period Weeks    Status New    Target Date 11/28/21                   Plan - 11/05/21 1318     Clinical Impression Statement Skilled session continues to focus on high level balance with empahsis on SLS and hip abd strength and also reactionary balance challenges.  Pt tolerated well but does need several seated rest breaks due to fatigue.    Personal Factors and Comorbidities Comorbidity 3+;Age    Comorbidities hypertension, hyperlipidemia, palpitations., hyperthyroid.    Examination-Activity Limitations Locomotion Level;Stairs;Stand    Examination-Participation Restrictions Art gallery manager;Yard Work;Shop    Stability/Clinical Decision Making Stable/Uncomplicated    Rehab Potential Good    PT Frequency 2x / week    PT Duration 8 weeks   plus eval   PT Treatment/Interventions ADLs/Self Care Home Management;Canalith Repostioning;Gait training;Stair training;Functional mobility training;Therapeutic activities;DME Instruction;Therapeutic exercise;Balance training;Neuromuscular re-education;Manual techniques;Passive range of  motion;Vestibular;Patient/family education    PT Next Visit Plan 10th visit PN, SLS activities. LLE strengthening. Dual Tasking. continue focus on high level balance, head turns/eyes closed. dual tasking. hip abductor strengthening. Review HEP and  progress as needed.    Consulted and Agree with Plan of Care Patient             Patient will benefit from skilled therapeutic intervention in order to improve the following deficits and impairments:  Abnormal gait, Decreased mobility, Decreased strength, Decreased balance, Difficulty walking  Visit Diagnosis: Other abnormalities of gait and mobility  Muscle weakness (generalized)  Difficulty in walking, not elsewhere classified     Problem List There are no problems to display for this patient.   Cameron Sprang, PT, MPT [facility] [address] [place], [place], [address] Phone: [phone]   Fax:  [phone] 11/05/21, 1:21 PM   Name: Jameila Keeny MRN: [id number] Date of Birth: Jul 04, 1941

## 2021-11-07 ENCOUNTER — Ambulatory Visit: Payer: Medicare PPO

## 2021-11-07 ENCOUNTER — Other Ambulatory Visit: Payer: Self-pay

## 2021-11-07 VITALS — BP 115/69 | HR 74

## 2021-11-07 DIAGNOSIS — R262 Difficulty in walking, not elsewhere classified: Secondary | ICD-10-CM

## 2021-11-07 DIAGNOSIS — R2689 Other abnormalities of gait and mobility: Secondary | ICD-10-CM | POA: Diagnosis not present

## 2021-11-07 DIAGNOSIS — R42 Dizziness and giddiness: Secondary | ICD-10-CM | POA: Diagnosis not present

## 2021-11-07 DIAGNOSIS — M6281 Muscle weakness (generalized): Secondary | ICD-10-CM | POA: Diagnosis not present

## 2021-11-07 NOTE — Therapy (Signed)
[facility] [address] [place], [place], [address] Phone: [phone]   Fax:  [phone]  Physical Therapy Treatment/Progress Note  Patient Details  Name: Tiffany [name] MRN: [id number] Date of Birth: [birth year] Referring Provider (PT): Lucianne Lei Physical Therapy Progress Note   Dates of Reporting Period: 10/01/21 - 11/06/20  See Note below for Objective Data and Assessment of Progress/Goals.  Thank you for the referral of this patient. Guillermina [place], PT, DPT   Encounter Date: 11/07/2021   PT End of Session - 11/07/21 1149     Visit Number 10    Number of Visits 17    Date for PT Re-Evaluation 11/28/21    Authorization Type [employer] so 10th visit progress note    PT Start Time 1147    PT Stop Time 1226    PT Time Calculation (min) 39 min    Equipment Utilized During Treatment Gait belt    Activity Tolerance Patient tolerated treatment well    Behavior During Therapy [facility] for tasks assessed/performed             No past medical history on file.  No past surgical history on file.  Vitals:   11/07/21 1151  BP: 115/69  Pulse: 74     Subjective Assessment - 11/07/21 1150     Subjective Patient reports she has been feeling lightheaded this morning. Took her BP this morning and it was normal. No falls.    Pertinent History PMH:  hypertension, hyperlipidemia, palpitations., hyperthyroid. Glaucoma eye surgery.    Patient Stated Goals Pt would like to improve her steps with walking like she should.    Currently in Pain? No/denies                               [facility] Adult PT Treatment/Exercise - 11/07/21 0001       Ambulation/Gait   Ambulation/Gait Yes    Ambulation/Gait Assistance 5: Supervision    Assistive device None    Gait Pattern Step-through pattern;Narrow base of support    Ambulation Surface Level;Indoor      Exercises   Exercises Knee/Hip      Knee/Hip  Exercises: Aerobic   Stepper Seated SciFit x 4 mins with BUEs/LEs at level 3 resistance, with monitoring BP due to lightheadedness. BP: 126/73. Continued for additional 4 minutes, with BP: 133/77.  overall strengthening and cardiovascular endurance.      Knee/Hip Exercises: Standing   Hip Abduction Both;2 sets;10 reps;Knee straight;Limitations    Abduction Limitations with addition of controlled toe tap to target                 Balance Exercises - 11/07/21 0001       Balance Exercises: Standing   SLS with Vectors Foam/compliant surface;Limitations    SLS with Vectors Limitations standing on blue mat on incline: completed alternating toe taps forwads to floor pebbles x 10 reps bilat, then completed x 10 reps crossover, with increased challenge noted. CGA required.    Tandem Gait Forward;Retro;3 reps;Limitations    Tandem Gait Limitations forward/retro tandem on firm surface x 2 laps down and back, then progressed to forward tandem on blue balance beam x 2 laps down and back. unsteady on complaint surface, UE support intermittent    Other Standing Exercises standing on incline on blue mat completed the following: staggered stance position holding x 30 seconds, then with each foot forward added horizontal  head turns x 10 reps each direction. standing on firm surface with opposite LE placed on soccerbal, completed A/P rolls forward x 15 reps bilat, and then lateral R/L rolls x 15 reps.    Other Standing Exercises Comments intermittent rest breaks required with balance activities due to fatigue.                  PT Short Term Goals - 10/29/21 0939       PT SHORT TERM GOAL #1   Title Pt will be independent on HEP for strengtheing and balance to continue gains on own.    Baseline 10/29/21 independent with current HEP    Time 4    Period Weeks    Status Achieved    Target Date 10/29/21      PT SHORT TERM GOAL #2   Title Pt will increase FGA from 19 to >23/30 for improved  balance and gait safety.    Baseline 10/01/21 19/30, 10/29/21 23/30    Time 4    Period Weeks    Status Partially Met    Target Date 10/29/21      PT SHORT TERM GOAL #3   Title Pt will decrease 5 x sit to stand from 10.69 sec to <9 sec for improved balance and functional strength.    Baseline 10/01/21 10.69 sec from chair without hands, 10/29/21 8.86 sec    Time 4    Status Achieved    Target Date 10/29/21      PT SHORT TERM GOAL #4   Title Pt will ambulate on level surfaces negotiating around objects to simulate household environment 300' independently.    Baseline 10/29/21 supervision when negotiating around objects    Time 4    Period Weeks    Status Partially Met    Target Date 10/29/21               PT Long Term Goals - 10/01/21 1114       PT LONG TERM GOAL #1   Title Pt will ambulate >1000' on varied surfaces independently with no LOB for improved community mobility. (LTGs due 11/28/21)    Time 8    Period Weeks    Status New    Target Date 11/28/21      PT LONG TERM GOAL #2   Title Pt will increase FGA to >26/30 for improved gait safety and balance.    Baseline 10/01/21 19/30    Time 8    Period Weeks    Status New    Target Date 11/28/21      PT LONG TERM GOAL #3   Title Pt will be able to ambulate up/down 4 steps without rail in reciprocal pattern for improved balance and functional strength.    Time 8    Period Weeks    Status New    Target Date 11/28/21                   Plan - 11/07/21 1213     Clinical Impression Statement Session limited today due to reports of lightheadedness and fatigue, vitals WNL but lower than patient's usual BP readings. Monitored throughout session with normal response noted. Continued high level balance and SLS activities further progressing to patient's tolerance. Will continue per POC.    Personal Factors and Comorbidities Comorbidity 3+;Age    Comorbidities hypertension, hyperlipidemia, palpitations.,  hyperthyroid.    Examination-Activity Limitations Locomotion Level;Stairs;Stand    Examination-Participation Restrictions Art gallery manager;Yard Work;Shop  Stability/Clinical Decision Making Stable/Uncomplicated    Rehab Potential Good    PT Frequency 2x / week    PT Duration 8 weeks   plus eval   PT Treatment/Interventions ADLs/Self Care Home Management;Canalith Repostioning;Gait training;Stair training;Functional mobility training;Therapeutic activities;DME Instruction;Therapeutic exercise;Balance training;Neuromuscular re-education;Manual techniques;Passive range of motion;Vestibular;Patient/family education    PT Next Visit Plan SLS activities. LLE strengthening. Dual Tasking. continue focus on high level balance, head turns/eyes closed. dual tasking. hip abductor strengthening. Review HEP and progress as needed.    Consulted and Agree with Plan of Care Patient             Patient will benefit from skilled therapeutic intervention in order to improve the following deficits and impairments:  Abnormal gait, Decreased mobility, Decreased strength, Decreased balance, Difficulty walking  Visit Diagnosis: Other abnormalities of gait and mobility  Muscle weakness (generalized)  Difficulty in walking, not elsewhere classified     Problem List There are no problems to display for this patient.   Jones Bales, PT, DPT 11/07/2021, 12:39 PM  [facility] [address] [place], [place], [address] Phone: [phone]   Fax:  [phone]  Name: Paticia Moster MRN: [id number] Date of Birth: 1940/12/28

## 2021-11-11 ENCOUNTER — Other Ambulatory Visit: Payer: Self-pay

## 2021-11-11 ENCOUNTER — Ambulatory Visit: Payer: Medicare PPO

## 2021-11-11 VITALS — BP 118/82

## 2021-11-11 DIAGNOSIS — M6281 Muscle weakness (generalized): Secondary | ICD-10-CM

## 2021-11-11 DIAGNOSIS — R2689 Other abnormalities of gait and mobility: Secondary | ICD-10-CM

## 2021-11-11 DIAGNOSIS — R262 Difficulty in walking, not elsewhere classified: Secondary | ICD-10-CM | POA: Diagnosis not present

## 2021-11-11 DIAGNOSIS — R42 Dizziness and giddiness: Secondary | ICD-10-CM | POA: Diagnosis not present

## 2021-11-11 NOTE — Therapy (Signed)
[facility] [address] [place], [place], [address] Phone: [phone]   Fax:  [phone]  Physical Therapy Treatment  Patient Details  Name: Tiffany [name] MRN: [id number] Date of Birth: 28-Oct-1941 Referring Provider (PT): Lucianne Lei   Encounter Date: 11/11/2021   PT End of Session - 11/11/21 1152     Visit Number 11    Number of Visits 17    Date for PT Re-Evaluation 11/28/21    Authorization Type [employer] so 10th visit progress note    PT Start Time 1150    PT Stop Time 1228    PT Time Calculation (min) 38 min    Equipment Utilized During Treatment Gait belt    Activity Tolerance Patient tolerated treatment well    Behavior During Therapy [facility] for tasks assessed/performed             History reviewed. No pertinent past medical history.  History reviewed. No pertinent surgical history.  Vitals:   11/11/21 1153  BP: 118/82     Subjective Assessment - 11/11/21 1152     Subjective Pt reports that she feels she may have taken her BP pill last time without checking and BP may have been a little lower to begin with. Feeling ok today.    Pertinent History PMH:  hypertension, hyperlipidemia, palpitations., hyperthyroid. Glaucoma eye surgery.    Patient Stated Goals Pt would like to improve her steps with walking like she should.    Currently in Pain? No/denies                               [facility] Adult PT Treatment/Exercise - 11/11/21 1156       Ambulation/Gait   Ambulation/Gait Yes    Ambulation/Gait Assistance 5: Supervision    Ambulation/Gait Assistance Details around in clinic between activities    Assistive device None    Gait Pattern Step-through pattern;Narrow base of support    Ambulation Surface Level;Indoor      Neuro Re-ed    Neuro Re-ed Details  Monster walk with red theraband around thighs 115' with verbal cues for form, side stepping with red theraband 40' x  2, marching walk with cues not to let feet cross when lowering back down with red theraband for cuing as well 40'x 2. Walking with pertubations from PT x 115'. Pt challenged with pertubation to left and slightly posterior as well. At bottom of steps: step up with LLE with opposite hip flexion x 8 CGA with cues to stand tall. Then repeated x 8 on RLE. More challenged on left sometimes crossing over with stepping back down. SLS on left with right foot on soccerball holding x 30 sec CGA, then adding in moving ball ant/post x 10 and side to side x 10 CGA. Increased sway with side to side movements. Sit to stand x 10 on airex with red theraband around thighs for more hip abductor activation.                       PT Short Term Goals - 10/29/21 0939       PT SHORT TERM GOAL #1   Title Pt will be independent on HEP for strengtheing and balance to continue gains on own.    Baseline 10/29/21 independent with current HEP    Time 4    Period Weeks    Status Achieved    Target Date  10/29/21      PT SHORT TERM GOAL #2   Title Pt will increase FGA from 19 to >23/30 for improved balance and gait safety.    Baseline 10/01/21 19/30, 10/29/21 23/30    Time 4    Period Weeks    Status Partially Met    Target Date 10/29/21      PT SHORT TERM GOAL #3   Title Pt will decrease 5 x sit to stand from 10.69 sec to <9 sec for improved balance and functional strength.    Baseline 10/01/21 10.69 sec from chair without hands, 10/29/21 8.86 sec    Time 4    Status Achieved    Target Date 10/29/21      PT SHORT TERM GOAL #4   Title Pt will ambulate on level surfaces negotiating around objects to simulate household environment 300' independently.    Baseline 10/29/21 supervision when negotiating around objects    Time 4    Period Weeks    Status Partially Met    Target Date 10/29/21               PT Long Term Goals - 10/01/21 1114       PT LONG TERM GOAL #1   Title Pt will ambulate  >1000' on varied surfaces independently with no LOB for improved community mobility. (LTGs due 11/28/21)    Time 8    Period Weeks    Status New    Target Date 11/28/21      PT LONG TERM GOAL #2   Title Pt will increase FGA to >26/30 for improved gait safety and balance.    Baseline 10/01/21 19/30    Time 8    Period Weeks    Status New    Target Date 11/28/21      PT LONG TERM GOAL #3   Title Pt will be able to ambulate up/down 4 steps without rail in reciprocal pattern for improved balance and functional strength.    Time 8    Period Weeks    Status New    Target Date 11/28/21                   Plan - 11/11/21 1359     Clinical Impression Statement PT continued to work on hip strengthening, high level balance and increasing SLS time especially on LLE. Pt progressing with exercises but does need cues at times. Some instability with increasing left SLS time.    Personal Factors and Comorbidities Comorbidity 3+;Age    Comorbidities hypertension, hyperlipidemia, palpitations., hyperthyroid.    Examination-Activity Limitations Locomotion Level;Stairs;Stand    Examination-Participation Restrictions Art gallery manager;Yard Work;Shop    Stability/Clinical Decision Making Stable/Uncomplicated    Rehab Potential Good    PT Frequency 2x / week    PT Duration 8 weeks   plus eval   PT Treatment/Interventions ADLs/Self Care Home Management;Canalith Repostioning;Gait training;Stair training;Functional mobility training;Therapeutic activities;DME Instruction;Therapeutic exercise;Balance training;Neuromuscular re-education;Manual techniques;Passive range of motion;Vestibular;Patient/family education    PT Next Visit Plan SLS activities. LLE strengthening. Dual Tasking. continue focus on high level balance, head turns/eyes closed. dual tasking. hip abductor strengthening. Review HEP and progress as needed.    Consulted and Agree with Plan of Care Patient              Patient will benefit from skilled therapeutic intervention in order to improve the following deficits and impairments:  Abnormal gait, Decreased mobility, Decreased strength, Decreased balance, Difficulty walking  Visit Diagnosis: Other  abnormalities of gait and mobility  Muscle weakness (generalized)     Problem List There are no problems to display for this patient.   Electa Sniff, PT, DPT, NCS 11/11/2021, 2:00 PM  [facility] [address] [place], [place], [address] Phone: [phone]   Fax:  [phone]  Name: Tiffany [name] MRN: [id number] Date of Birth: [birth year]

## 2021-11-12 ENCOUNTER — Ambulatory Visit: Payer: Medicare PPO

## 2021-11-12 VITALS — BP 118/80

## 2021-11-12 DIAGNOSIS — R2689 Other abnormalities of gait and mobility: Secondary | ICD-10-CM | POA: Diagnosis not present

## 2021-11-12 DIAGNOSIS — M6281 Muscle weakness (generalized): Secondary | ICD-10-CM

## 2021-11-12 DIAGNOSIS — R42 Dizziness and giddiness: Secondary | ICD-10-CM | POA: Diagnosis not present

## 2021-11-12 DIAGNOSIS — R262 Difficulty in walking, not elsewhere classified: Secondary | ICD-10-CM | POA: Diagnosis not present

## 2021-11-12 NOTE — Therapy (Signed)
[facility] [address] [place], [place], [address] Phone: [phone]   Fax:  [phone]  Physical Therapy Treatment  Patient Details  Name: Timea Breed MRN: [id number] Date of Birth: 20-May-1941 Referring Provider (PT): Lucianne Lei   Encounter Date: 11/12/2021   PT End of Session - 11/12/21 1107     Visit Number 12    Number of Visits 17    Date for PT Re-Evaluation 11/28/21    Authorization Type [employer] so 10th visit progress note    PT Start Time 1105    PT Stop Time 1143    PT Time Calculation (min) 38 min    Equipment Utilized During Treatment Gait belt    Activity Tolerance Patient tolerated treatment well    Behavior During Therapy [facility] for tasks assessed/performed             History reviewed. No pertinent past medical history.  History reviewed. No pertinent surgical history.  Vitals:   11/12/21 1111  BP: 118/80     Subjective Assessment - 11/12/21 1107     Subjective Pt reports she is feeling ok. Had to lay down after yesterday. Pt did not take BP meds this morning as was running lower at systolic of 768.    Pertinent History PMH:  hypertension, hyperlipidemia, palpitations., hyperthyroid. Glaucoma eye surgery.    Patient Stated Goals Pt would like to improve her steps with walking like she should.    Currently in Pain? No/denies                               [facility] Adult PT Treatment/Exercise - 11/12/21 1108       Ambulation/Gait   Ambulation/Gait Yes    Ambulation/Gait Assistance 5: Supervision    Ambulation/Gait Assistance Details around in clinic between activities    Assistive device None    Gait Pattern Step-through pattern;Narrow base of support    Ambulation Surface Level;Indoor      Neuro Re-ed    Neuro Re-ed Details  In // bars: standing on rockerboard positioned ant/post- trying to maintain level x 30 sec with occasional LOB posterior. Cued  to try to push toes down to help. Again on board with head turns up/down x 10, eyes closed 30 sec x 2 CGA. On rockerboard alternating toe taps on cone with 1UE fingertip support x 10 each leg then stepping up and tapping with other foot and bringing back x 10 each leg again only 1 UE fingertip support. After seated break switched to rockerboard lateral: trying to maintain level x 30 sec, head turns side to side x 10, eyes closed 30 sec x 2 CGA. Pt has increased sway with eyes closed.      Exercises   Exercises Other Exercises    Other Exercises  Wall bump to work on activating anterior tib more to control and assist with posterior balance loss x 10.                     PT Education - 11/12/21 1352     Education Details Added wall bump to HEP    Person(s) Educated Patient    Methods Explanation;Demonstration;Handout    Comprehension Verbalized understanding              PT Short Term Goals - 10/29/21 0939       PT SHORT TERM GOAL #1   Title Pt  will be independent on HEP for strengtheing and balance to continue gains on own.    Baseline 10/29/21 independent with current HEP    Time 4    Period Weeks    Status Achieved    Target Date 10/29/21      PT SHORT TERM GOAL #2   Title Pt will increase FGA from 19 to >23/30 for improved balance and gait safety.    Baseline 10/01/21 19/30, 10/29/21 23/30    Time 4    Period Weeks    Status Partially Met    Target Date 10/29/21      PT SHORT TERM GOAL #3   Title Pt will decrease 5 x sit to stand from 10.69 sec to <9 sec for improved balance and functional strength.    Baseline 10/01/21 10.69 sec from chair without hands, 10/29/21 8.86 sec    Time 4    Status Achieved    Target Date 10/29/21      PT SHORT TERM GOAL #4   Title Pt will ambulate on level surfaces negotiating around objects to simulate household environment 300' independently.    Baseline 10/29/21 supervision when negotiating around objects    Time 4     Period Weeks    Status Partially Met    Target Date 10/29/21               PT Long Term Goals - 10/01/21 1114       PT LONG TERM GOAL #1   Title Pt will ambulate >1000' on varied surfaces independently with no LOB for improved community mobility. (LTGs due 11/28/21)    Time 8    Period Weeks    Status New    Target Date 11/28/21      PT LONG TERM GOAL #2   Title Pt will increase FGA to >26/30 for improved gait safety and balance.    Baseline 10/01/21 19/30    Time 8    Period Weeks    Status New    Target Date 11/28/21      PT LONG TERM GOAL #3   Title Pt will be able to ambulate up/down 4 steps without rail in reciprocal pattern for improved balance and functional strength.    Time 8    Period Weeks    Status New    Target Date 11/28/21                   Plan - 11/12/21 1353     Clinical Impression Statement PT continued to work on balance on compliant surfaces with eyes closed as well. Pt was challenged but did improve with practice. Noted anterior tib not helping as much with ankle strategy so issued wall bump exercise.    Personal Factors and Comorbidities Comorbidity 3+;Age    Comorbidities hypertension, hyperlipidemia, palpitations., hyperthyroid.    Examination-Activity Limitations Locomotion Level;Stairs;Stand    Examination-Participation Restrictions Art gallery manager;Yard Work;Shop    Stability/Clinical Decision Making Stable/Uncomplicated    Rehab Potential Good    PT Frequency 2x / week    PT Duration 8 weeks   plus eval   PT Treatment/Interventions ADLs/Self Care Home Management;Canalith Repostioning;Gait training;Stair training;Functional mobility training;Therapeutic activities;DME Instruction;Therapeutic exercise;Balance training;Neuromuscular re-education;Manual techniques;Passive range of motion;Vestibular;Patient/family education    PT Next Visit Plan SLS activities. LLE strengthening. Dual Tasking. continue focus on high level  balance, head turns/eyes closed. dual tasking. hip abductor strengthening. Review HEP and progress as needed.    Consulted and Agree with Plan  of Care Patient             Patient will benefit from skilled therapeutic intervention in order to improve the following deficits and impairments:  Abnormal gait, Decreased mobility, Decreased strength, Decreased balance, Difficulty walking  Visit Diagnosis: Other abnormalities of gait and mobility  Muscle weakness (generalized)     Problem List There are no problems to display for this patient.   Electa Sniff, PT, DPT, NCS 11/12/2021, 1:54 PM  [facility] [address] [place], [place], [address] Phone: [phone]   Fax:  [phone]  Name: Estel Tonelli MRN: [id number] Date of Birth: Jun 29, 1941

## 2021-11-12 NOTE — Patient Instructions (Signed)
Access Code: [email or web address] URL: [email or web address] Date: 11/12/2021 Prepared by: Elmer Bales  Exercises Sit to Stand with Armchair - 1 x daily - 7 x weekly - 2 sets - 5-10 reps Standing on pillow eyes open and eyes closed - 1 x daily - 5 x weekly - 1 sets - 3 reps - 20 sec hold Supine Bridge - 1-2 x daily - 5 x weekly - 2 sets - 10 reps Standing Single Leg Stance with Counter Support - 2 x daily - 5 x weekly - 3 sets - 10 hold Heel Raises with Counter Support - 2 x daily - 5 x weekly - 2 sets - 10 reps Wall bumps with ankles - 1 x daily - 5 x weekly - 2 sets - 10 reps

## 2021-11-18 ENCOUNTER — Other Ambulatory Visit: Payer: Self-pay

## 2021-11-18 ENCOUNTER — Ambulatory Visit: Payer: Medicare PPO | Admitting: Physical Therapy

## 2021-11-18 VITALS — BP 106/54 | HR 94

## 2021-11-18 DIAGNOSIS — M6281 Muscle weakness (generalized): Secondary | ICD-10-CM

## 2021-11-18 DIAGNOSIS — R2689 Other abnormalities of gait and mobility: Secondary | ICD-10-CM

## 2021-11-18 DIAGNOSIS — R262 Difficulty in walking, not elsewhere classified: Secondary | ICD-10-CM

## 2021-11-18 DIAGNOSIS — R42 Dizziness and giddiness: Secondary | ICD-10-CM

## 2021-11-18 NOTE — Therapy (Signed)
[facility] [address] [place], [place], [address] Phone: [phone]   Fax:  [phone]  Physical Therapy Treatment - No Charge Visit  Patient Details  Name: Tiffany [name] MRN: [id number] Date of Birth: 1940-11-05 Referring Provider (PT): Lucianne Lei   Encounter Date: 11/18/2021   PT End of Session - 11/18/21 1145     Visit Number 13    Number of Visits 17    Date for PT Re-Evaluation 11/28/21    Authorization Type [employer] so 10th visit progress note    PT Start Time 1145    PT Stop Time 1230    PT Time Calculation (min) 45 min    Equipment Utilized During Treatment Gait belt    Activity Tolerance Patient tolerated treatment well    Behavior During Therapy [facility] for tasks assessed/performed             No past medical history on file.  No past surgical history on file.  Vitals:   11/18/21 1155 11/18/21 1159  BP: 128/72 (!) 106/54  Pulse: 79 94  SpO2: 100%      Subjective Assessment - 11/18/21 1154     Subjective Pt comes into therapy reporting lightheadedness. Pt states her BP was alright when she checked it though but is not feeling 100%    Pertinent History PMH:  hypertension, hyperlipidemia, palpitations., hyperthyroid. Glaucoma eye surgery.    Patient Stated Goals Pt would like to improve her steps with walking like she should.    Currently in Pain? No/denies                               [facility] Adult PT Treatment/Exercise - 11/18/21 0001       Knee/Hip Exercises: Aerobic   Nustep L6 x 3 min, L4 x 3 min UEs and LEs                       PT Short Term Goals - 10/29/21 0939       PT SHORT TERM GOAL #1   Title Pt will be independent on HEP for strengtheing and balance to continue gains on own.    Baseline 10/29/21 independent with current HEP    Time 4    Period Weeks    Status Achieved    Target Date 10/29/21      PT SHORT TERM GOAL #2    Title Pt will increase FGA from 19 to >23/30 for improved balance and gait safety.    Baseline 10/01/21 19/30, 10/29/21 23/30    Time 4    Period Weeks    Status Partially Met    Target Date 10/29/21      PT SHORT TERM GOAL #3   Title Pt will decrease 5 x sit to stand from 10.69 sec to <9 sec for improved balance and functional strength.    Baseline 10/01/21 10.69 sec from chair without hands, 10/29/21 8.86 sec    Time 4    Status Achieved    Target Date 10/29/21      PT SHORT TERM GOAL #4   Title Pt will ambulate on level surfaces negotiating around objects to simulate household environment 300' independently.    Baseline 10/29/21 supervision when negotiating around objects    Time 4    Period Weeks    Status Partially Met    Target Date 10/29/21  PT Long Term Goals - 10/01/21 1114       PT LONG TERM GOAL #1   Title Pt will ambulate >1000' on varied surfaces independently with no LOB for improved community mobility. (LTGs due 11/28/21)    Time 8    Period Weeks    Status New    Target Date 11/28/21      PT LONG TERM GOAL #2   Title Pt will increase FGA to >26/30 for improved gait safety and balance.    Baseline 10/01/21 19/30    Time 8    Period Weeks    Status New    Target Date 11/28/21      PT LONG TERM GOAL #3   Title Pt will be able to ambulate up/down 4 steps without rail in reciprocal pattern for improved balance and functional strength.    Time 8    Period Weeks    Status New    Target Date 11/28/21                   Plan - 11/18/21 1218     Clinical Impression Statement Pt feeling lightheaded prior to PT treatment. Checked BP and found to be 128/72. Trialed using Nustep and check if BP remained stable -- BP initially rose as expected after exercise and then dropped to 106/54 after 5 min rest. Discussed with pt that this is not a normal response after exercise; she states she would like to defer treatment for now. Discussed for  her to talk to her PCP. Pt states she has an appointment. Pt states she will f/u tomorrow and see if she is feeling less lightheaded.    Personal Factors and Comorbidities Comorbidity 3+;Age    Comorbidities hypertension, hyperlipidemia, palpitations., hyperthyroid.    Examination-Activity Limitations Locomotion Level;Stairs;Stand    Examination-Participation Restrictions Art gallery manager;Yard Work;Shop    Stability/Clinical Decision Making Stable/Uncomplicated    Rehab Potential Good    PT Frequency 2x / week    PT Duration 8 weeks   plus eval   PT Treatment/Interventions ADLs/Self Care Home Management;Canalith Repostioning;Gait training;Stair training;Functional mobility training;Therapeutic activities;DME Instruction;Therapeutic exercise;Balance training;Neuromuscular re-education;Manual techniques;Passive range of motion;Vestibular;Patient/family education    PT Next Visit Plan SLS activities. LLE strengthening. Dual Tasking. continue focus on high level balance, head turns/eyes closed. dual tasking. hip abductor strengthening. Review HEP and progress as needed.    Consulted and Agree with Plan of Care Patient             Patient will benefit from skilled therapeutic intervention in order to improve the following deficits and impairments:  Abnormal gait, Decreased mobility, Decreased strength, Decreased balance, Difficulty walking  Visit Diagnosis: Other abnormalities of gait and mobility  Muscle weakness (generalized)  Difficulty in walking, not elsewhere classified  Dizziness and giddiness     Problem List There are no problems to display for this patient.   [facility] [address], PT, DPT 11/18/2021, 12:23 PM  [facility] [address] [place], [place], [address] Phone: [phone]   Fax:  [phone]  Name: Tiffany [name] MRN: [id number] Date of Birth: 01-25-1941

## 2021-11-19 ENCOUNTER — Ambulatory Visit: Payer: Medicare PPO | Admitting: Physical Therapy

## 2021-11-21 DIAGNOSIS — I959 Hypotension, unspecified: Secondary | ICD-10-CM | POA: Diagnosis not present

## 2021-11-21 DIAGNOSIS — R42 Dizziness and giddiness: Secondary | ICD-10-CM | POA: Diagnosis not present

## 2021-11-25 ENCOUNTER — Other Ambulatory Visit: Payer: Self-pay

## 2021-11-25 ENCOUNTER — Ambulatory Visit: Payer: Medicare PPO

## 2021-11-25 VITALS — BP 129/78 | HR 84

## 2021-11-25 DIAGNOSIS — M6281 Muscle weakness (generalized): Secondary | ICD-10-CM | POA: Diagnosis not present

## 2021-11-25 DIAGNOSIS — R2689 Other abnormalities of gait and mobility: Secondary | ICD-10-CM | POA: Diagnosis not present

## 2021-11-25 DIAGNOSIS — R262 Difficulty in walking, not elsewhere classified: Secondary | ICD-10-CM

## 2021-11-25 DIAGNOSIS — R42 Dizziness and giddiness: Secondary | ICD-10-CM

## 2021-11-25 NOTE — Therapy (Signed)
[facility] [address] [place], [place], [address] Phone: [phone]   Fax:  [phone]  Physical Therapy Treatment/Discharge Summary  Patient Details  Name: Tiffany [name] MRN: [id number] Date of Birth: Mar 28, 1941 Referring Provider (PT): Lucianne Lei  PHYSICAL THERAPY DISCHARGE SUMMARY  Visits from Start of Care: 14  Current functional level related to goals / functional outcomes: See Clinical Impression Statement   Remaining deficits: None   Education / Equipment: HEP Provided   Patient agrees to discharge. Patient goals were met. Patient is being discharged due to meeting the stated rehab goals.   Encounter Date: 11/25/2021   PT End of Session - 11/25/21 1102     Visit Number 14    Number of Visits 17    Date for PT Re-Evaluation 11/28/21    Authorization Type [employer] so 10th visit progress note    PT Start Time 1102    PT Stop Time 1143    PT Time Calculation (min) 41 min    Equipment Utilized During Treatment Gait belt    Activity Tolerance Patient tolerated treatment well    Behavior During Therapy WFL for tasks assessed/performed             No past medical history on file.  No past surgical history on file.  Vitals:   11/25/21 1109  BP: 129/78  Pulse: 84     Subjective Assessment - 11/25/21 1104     Subjective Reports lightheadedness has been better as well. No other new changes/complaints per patient reports. MD advised to continue to take her BP medications.    Pertinent History PMH:  hypertension, hyperlipidemia, palpitations., hyperthyroid. Glaucoma eye surgery.    Patient Stated Goals Pt would like to improve her steps with walking like she should.    Currently in Pain? No/denies                [facility] PT Assessment - 11/25/21 0001       Functional Gait  Assessment   Gait assessed  Yes    Gait Level Surface Walks 20 ft in less than 7 sec but greater than  5.5 sec, uses assistive device, slower speed, mild gait deviations, or deviates 6-10 in outside of the 12 in walkway width.    Change in Gait Speed Able to smoothly change walking speed without loss of balance or gait deviation. Deviate no more than 6 in outside of the 12 in walkway width.    Gait with Horizontal Head Turns Performs head turns smoothly with no change in gait. Deviates no more than 6 in outside 12 in walkway width    Gait with Vertical Head Turns Performs head turns with no change in gait. Deviates no more than 6 in outside 12 in walkway width.    Gait and Pivot Turn Pivot turns safely in greater than 3 sec and stops with no loss of balance, or pivot turns safely within 3 sec and stops with mild imbalance, requires small steps to catch balance.    Step Over Obstacle Is able to step over 2 stacked shoe boxes taped together (9 in total height) without changing gait speed. No evidence of imbalance.    Gait with Narrow Base of Support Is able to ambulate for 10 steps heel to toe with no staggering.    Gait with Eyes Closed Walks 20 ft, no assistive devices, good speed, no evidence of imbalance, normal gait pattern, deviates no more than 6 in outside 12  in walkway width. Ambulates 20 ft in less than 7 sec.    Ambulating Backwards Walks 20 ft, uses assistive device, slower speed, mild gait deviations, deviates 6-10 in outside 12 in walkway width.    Steps Alternating feet, no rail.    Total Score 27    FGA comment: 27/30 = Low Fall Risk               [facility] Adult PT Treatment/Exercise - 11/25/21 0001       Ambulation/Gait   Ambulation/Gait Yes    Ambulation/Gait Assistance 6: Modified independent (Device/Increase time)    Ambulation/Gait Assistance Details completed ambulation indoors/outdoors on various unlevel surfaces x 1000 ft with patient Mod I. No imbalance noted.    Ambulation Distance (Feet) 1000 Feet    Assistive device None    Gait Pattern Step-through pattern;Narrow  base of support    Ambulation Surface Level;Indoor    Stairs Yes    Stairs Assistance 6: Modified independent (Device/Increase time)    Stair Management Technique No rails;Alternating pattern;Forwards    Number of Stairs 12    Height of Stairs 6    Gait Comments BP after ambulation: 153/82, HR: 88             Reviewed entire HEP and updated to patient's tolerance:   Access Code: [email or web address] URL: [email or web address] Date: 11/25/2021 Prepared by: Baldomero Lamy  Exercises Sit to Stand with Arms Crossed - 1 x daily - 7 x weekly - 2 sets - 10 reps Standing on pillow eyes open and eyes closed - 1 x daily - 5 x weekly - 1 sets - 3 reps - 20 sec hold Supine Bridge - 1-2 x daily - 5 x weekly - 2 sets - 10 reps Standing Single Leg Stance with Counter Support - 2 x daily - 5 x weekly - 3 sets - 10 hold Heel Raises with Counter Support - 2 x daily - 5 x weekly - 2 sets - 10 reps Wall bumps with ankles - 1 x daily - 5 x weekly - 2 sets - 10 reps Tandem Walking - 1 x daily - 5 x weekly - 1 sets - 3-4 reps     PT Education - 11/25/21 1143     Education Details Progress toward LTGs; HEP Update    Person(s) Educated Patient    Methods Explanation;Demonstration;Handout    Comprehension Verbalized understanding;Returned demonstration              PT Short Term Goals - 10/29/21 0939       PT SHORT TERM GOAL #1   Title Pt will be independent on HEP for strengtheing and balance to continue gains on own.    Baseline 10/29/21 independent with current HEP    Time 4    Period Weeks    Status Achieved    Target Date 10/29/21      PT SHORT TERM GOAL #2   Title Pt will increase FGA from 19 to >23/30 for improved balance and gait safety.    Baseline 10/01/21 19/30, 10/29/21 23/30    Time 4    Period Weeks    Status Partially Met    Target Date 10/29/21      PT SHORT TERM GOAL #3   Title Pt will decrease 5 x sit to stand from 10.69 sec to <9 sec for improved balance  and functional strength.    Baseline 10/01/21 10.69 sec from chair without hands, 10/29/21 8.86 sec  Time 4    Status Achieved    Target Date 10/29/21      PT SHORT TERM GOAL #4   Title Pt will ambulate on level surfaces negotiating around objects to simulate household environment 300' independently.    Baseline 10/29/21 supervision when negotiating around objects    Time 4    Period Weeks    Status Partially Met    Target Date 10/29/21               PT Long Term Goals - 11/25/21 1121       PT LONG TERM GOAL #1   Title Pt will ambulate >1000' on varied surfaces independently with no LOB for improved community mobility. (LTGs due 11/28/21)    Time 8    Period Weeks    Status New    Target Date 11/28/21      PT LONG TERM GOAL #2   Title Pt will increase FGA to >26/30 for improved gait safety and balance.    Baseline 10/01/21 19/30; 27/30    Time 8    Period Weeks    Status Achieved    Target Date 11/28/21      PT LONG TERM GOAL #3   Title Pt will be able to ambulate up/down 4 steps without rail in reciprocal pattern for improved balance and functional strength.    Baseline 12 stairs without rails and reciprocal pattern Mod I    Time 8    Period Weeks    Status Achieved    Target Date 11/28/21                   Plan - 11/25/21 1145     Clinical Impression Statement Completed assesment of patient's progress toward LTGs. patient able to meet all LTG at this time demonstrating improved balance with stairs and ambulation on unlevel outdoors surfaces. Patient also improved FGA to 27/30 demonstrating low fall risk. Reviewed and updated HEP to patient's tolerance. Patient demo readiness to d/c from PT services at this time due to progress, with patient agreeable.    Personal Factors and Comorbidities Comorbidity 3+;Age    Comorbidities hypertension, hyperlipidemia, palpitations., hyperthyroid.    Examination-Activity Limitations Locomotion Level;Stairs;Stand     Examination-Participation Restrictions Art gallery manager;Yard Work;Shop    Stability/Clinical Decision Making Stable/Uncomplicated    Rehab Potential Good    PT Frequency 2x / week    PT Duration 8 weeks   plus eval   PT Treatment/Interventions ADLs/Self Care Home Management;Canalith Repostioning;Gait training;Stair training;Functional mobility training;Therapeutic activities;DME Instruction;Therapeutic exercise;Balance training;Neuromuscular re-education;Manual techniques;Passive range of motion;Vestibular;Patient/family education    PT Next Visit Plan d/c this visit.    Consulted and Agree with Plan of Care Patient             Patient will benefit from skilled therapeutic intervention in order to improve the following deficits and impairments:  Abnormal gait, Decreased mobility, Decreased strength, Decreased balance, Difficulty walking  Visit Diagnosis: Other abnormalities of gait and mobility  Muscle weakness (generalized)  Difficulty in walking, not elsewhere classified  Dizziness and giddiness     Problem List There are no problems to display for this patient.   Jones Bales, PT, DPT 11/25/2021, 12:37 PM  [facility] [address] [place], [place], [address] Phone: [phone]   Fax:  [phone]  Name: Tiffany [name] MRN: [id number] Date of Birth: September 13, 1941

## 2021-11-25 NOTE — Patient Instructions (Signed)
Access Code: [email or web address] URL: [email or web address] Date: 11/25/2021 Prepared by: Baldomero Lamy  Exercises Sit to Stand with Arms Crossed - 1 x daily - 7 x weekly - 2 sets - 10 reps Standing on pillow eyes open and eyes closed - 1 x daily - 5 x weekly - 1 sets - 3 reps - 20 sec hold Supine Bridge - 1-2 x daily - 5 x weekly - 2 sets - 10 reps Standing Single Leg Stance with Counter Support - 2 x daily - 5 x weekly - 3 sets - 10 hold Heel Raises with Counter Support - 2 x daily - 5 x weekly - 2 sets - 10 reps Wall bumps with ankles - 1 x daily - 5 x weekly - 2 sets - 10 reps Tandem Walking - 1 x daily - 5 x weekly - 1 sets - 3-4 reps

## 2021-11-26 ENCOUNTER — Ambulatory Visit: Payer: Medicare PPO

## 2021-11-27 ENCOUNTER — Ambulatory Visit: Payer: Medicare PPO

## 2021-12-10 DIAGNOSIS — H93A3 Pulsatile tinnitus, bilateral: Secondary | ICD-10-CM | POA: Diagnosis not present

## 2021-12-10 DIAGNOSIS — H903 Sensorineural hearing loss, bilateral: Secondary | ICD-10-CM | POA: Diagnosis not present

## 2021-12-11 ENCOUNTER — Other Ambulatory Visit: Payer: Self-pay | Admitting: Physician Assistant

## 2021-12-11 DIAGNOSIS — I10 Essential (primary) hypertension: Secondary | ICD-10-CM | POA: Diagnosis not present

## 2021-12-11 DIAGNOSIS — Z6828 Body mass index (BMI) 28.0-28.9, adult: Secondary | ICD-10-CM | POA: Diagnosis not present

## 2021-12-12 ENCOUNTER — Other Ambulatory Visit: Payer: Self-pay | Admitting: Physician Assistant

## 2021-12-12 ENCOUNTER — Ambulatory Visit (HOSPITAL_COMMUNITY)
Admission: RE | Admit: 2021-12-12 | Discharge: 2021-12-12 | Disposition: A | Discharge status: Home / Self Care | Payer: Medicare PPO | Source: Ambulatory Visit | Attending: Physician Assistant | Admitting: Physician Assistant

## 2021-12-12 ENCOUNTER — Other Ambulatory Visit: Payer: Self-pay

## 2021-12-12 ENCOUNTER — Other Ambulatory Visit (HOSPITAL_COMMUNITY): Payer: Self-pay | Admitting: Physician Assistant

## 2021-12-12 ENCOUNTER — Encounter (HOSPITAL_COMMUNITY): Payer: Self-pay

## 2021-12-12 DIAGNOSIS — H93A3 Pulsatile tinnitus, bilateral: Secondary | ICD-10-CM

## 2021-12-23 ENCOUNTER — Other Ambulatory Visit: Payer: Medicare PPO

## 2021-12-28 ENCOUNTER — Ambulatory Visit
Admission: RE | Admit: 2021-12-28 | Discharge: 2021-12-28 | Disposition: A | Discharge status: Home / Self Care | Payer: Medicare PPO | Source: Ambulatory Visit | Attending: Physician Assistant | Admitting: Physician Assistant

## 2021-12-28 ENCOUNTER — Other Ambulatory Visit: Payer: Self-pay

## 2021-12-28 DIAGNOSIS — I6621 Occlusion and stenosis of right posterior cerebral artery: Secondary | ICD-10-CM | POA: Diagnosis not present

## 2021-12-28 DIAGNOSIS — H93A3 Pulsatile tinnitus, bilateral: Secondary | ICD-10-CM

## 2021-12-28 IMAGING — MR MR HEAD WO/W CM
12 series · 48 of 48 positions shown · IV contrast (multihance)
Comparison: None.

CLINICAL DATA: Pulsatile tinnitus of both ears

EXAM:
MRI HEAD WITHOUT AND WITH CONTRAST
TECHNIQUE: Multiplanar, multiecho pulse sequences of the brain and surrounding
structures were obtained without and with intravenous contrast.
CONTRAST:  13mL MULTIHANCE GADOBENATE DIMEGLUMINE 529 MG/ML IV SOLN

[Series 1: T1 · sagittal · 5.0mm · 0.47mm/px · 2 of 21 slices shown]
[im 1/21]
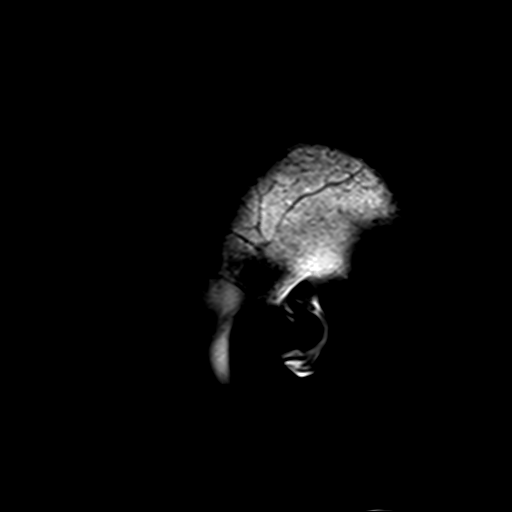
[im 21/21]
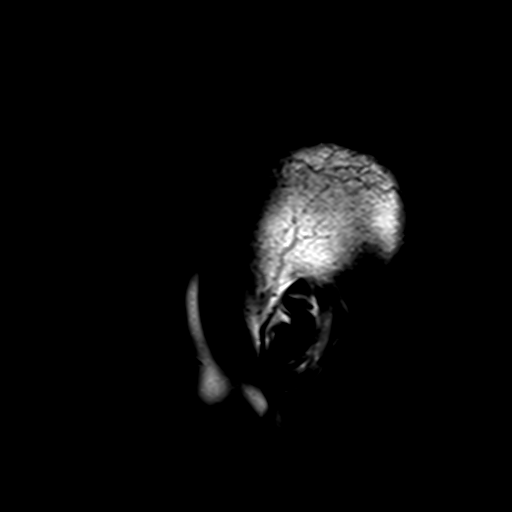

[Series 2: DWI · axial · 3.0mm · 1.88mm/px · z∈[-79,+67]mm · 7 of 100 slices shown (1 of 4)]
[im 1/100]
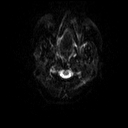
[im 17/100]
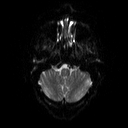
[im 34/100]
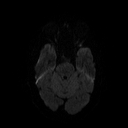
[im 50/100]
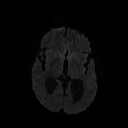
[im 67/100]
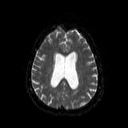
[im 83/100]
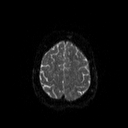
[im 100/100]
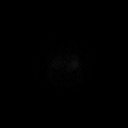

[Series 3: DWI · axial · 3.0mm · 1.88mm/px · z∈[-79,+67]mm · 3 of 50 slices shown (2 of 4)]
[im 1/50]
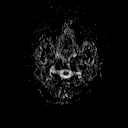
[im 25/50]
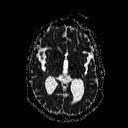
[im 50/50]
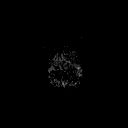

[Series 4: DWI · coronal · 5.0mm · 1.80mm/px · 5 of 71 slices shown (3 of 4)]
[im 1/71]
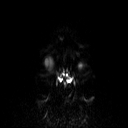
[im 18/71]
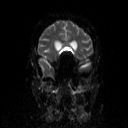
[im 36/71]
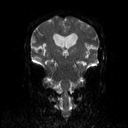
[im 53/71]
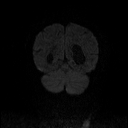
[im 71/71]
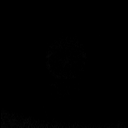

[Series 5: DWI · coronal · 5.0mm · 1.80mm/px · 2 of 36 slices shown (4 of 4)]
[im 1/36]
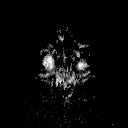
[im 36/36]
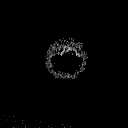

[Series 6: T2 · axial · 5.0mm · 0.90mm/px · 1 of 21 slices shown (1 of 2)]
[im 1/21]
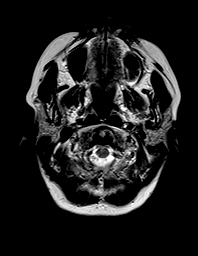

[Series 7: FLAIR · axial · 3.0mm · 0.47mm/px · z∈[-80,+64]mm · 2 of 32 slices shown]
[im 1/32]
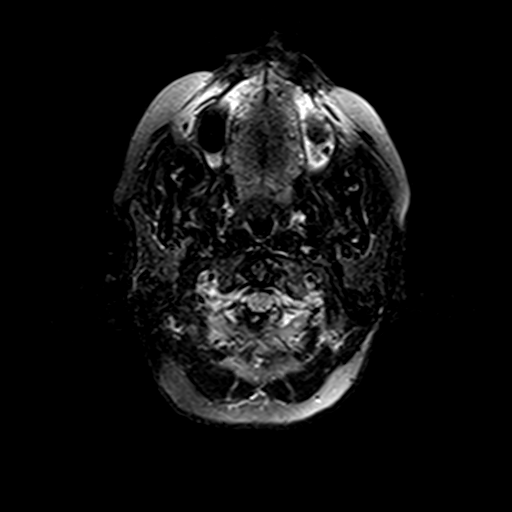
[im 32/32]
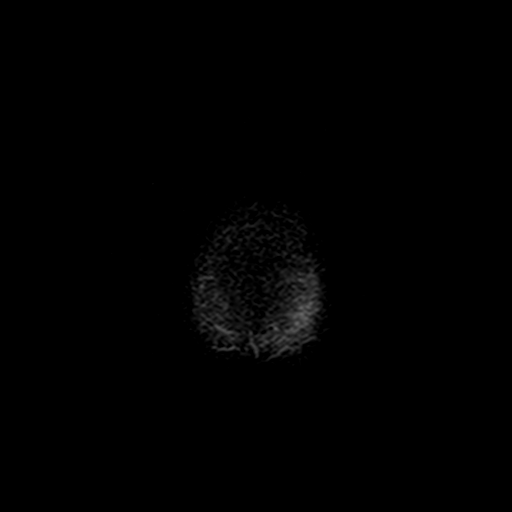

[Series 9: swi_images · axial · 4.0mm · 0.90mm/px · z∈[-75,+65]mm · 2 of 36 slices shown]
[im 1/36]
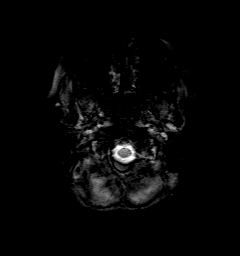
[im 36/36]
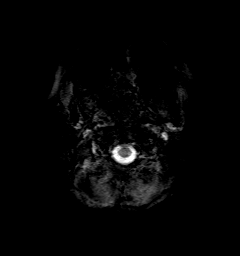

[Series 10: t1_mpr_tra · axial · 1.0mm · 0.75mm/px · z∈[-70,+73]mm · 10 of 144 slices shown]
[im 1/144]
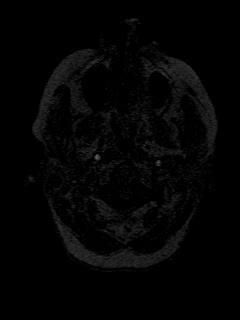
[im 16/144]
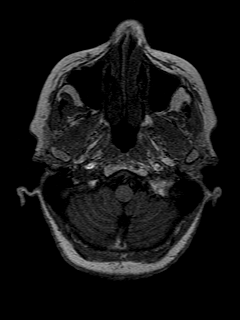
[im 32/144]
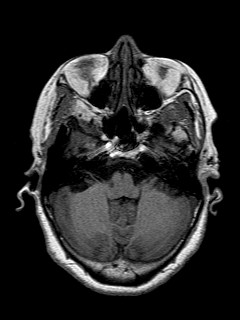
[im 48/144]
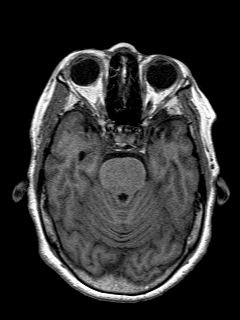
[im 64/144]
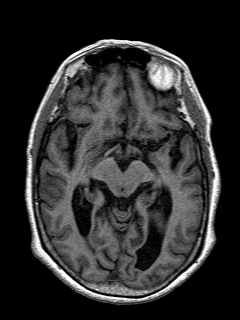
[im 80/144]
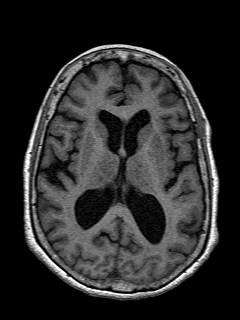
[im 96/144]
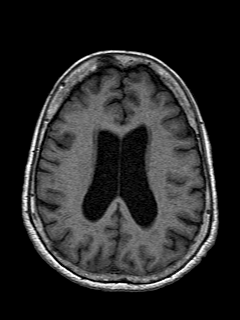
[im 112/144]
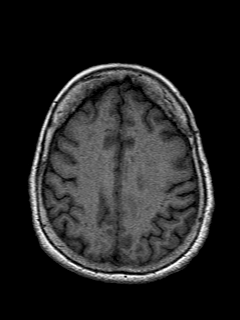
[im 128/144]
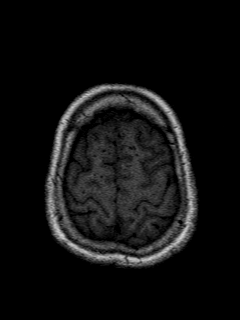
[im 144/144]
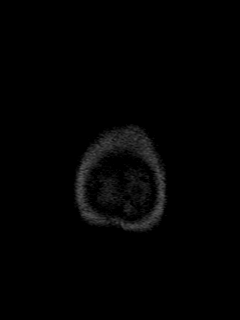

[Series 11: T2 · coronal · 5.0mm · 0.45mm/px · 2 of 28 slices shown (2 of 2)]
[im 1/28]
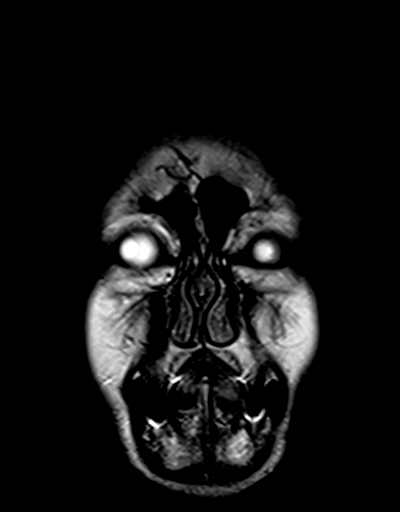
[im 28/28]
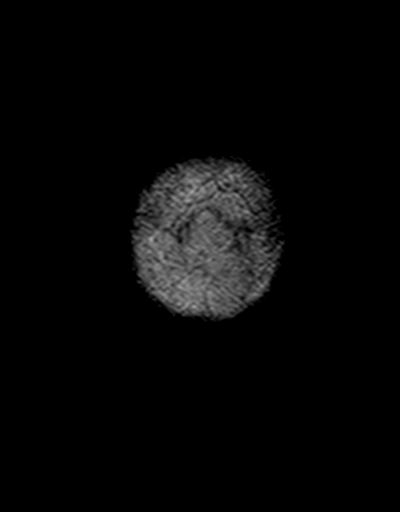

[Series 12: t1_mpr_tra post · axial · 1.0mm · 0.75mm/px · z∈[-70,+73]mm · 10 of 144 slices shown]
[im 1/144]
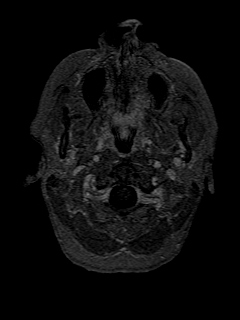
[im 16/144]
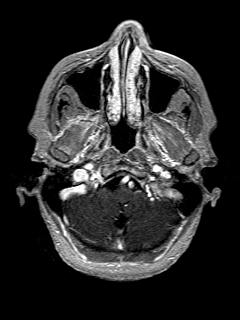
[im 32/144]
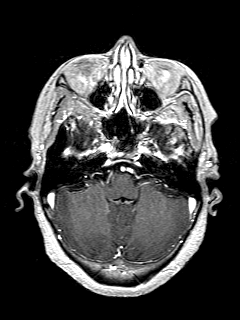
[im 48/144]
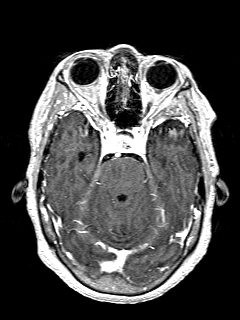
[im 64/144]
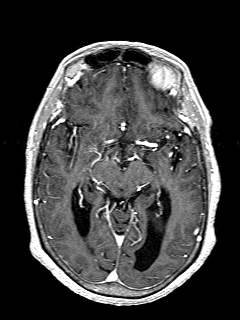
[im 80/144]
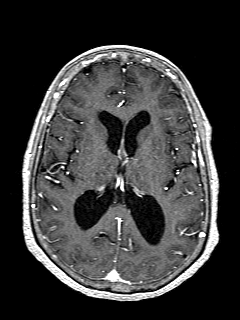
[im 96/144]
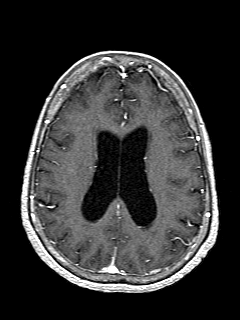
[im 112/144]
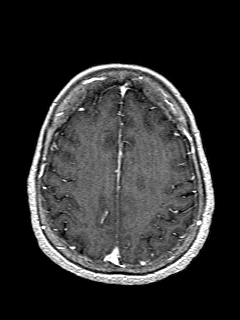
[im 128/144]
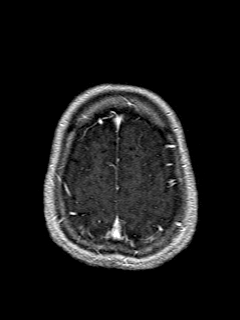
[im 144/144]
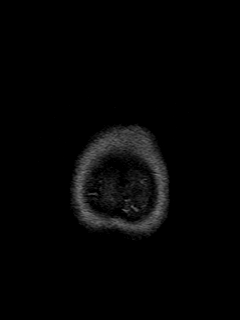

[Series 13: post cor · coronal · 5.0mm · 0.45mm/px · 2 of 28 slices shown]
[im 1/28]
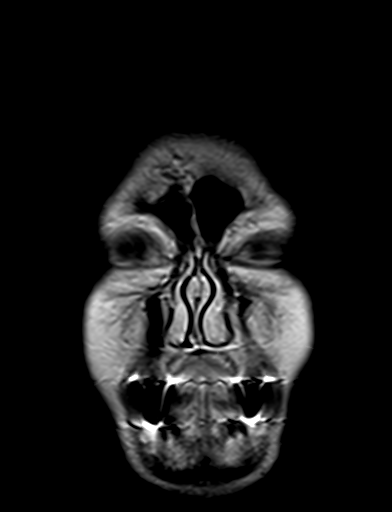
[im 28/28]
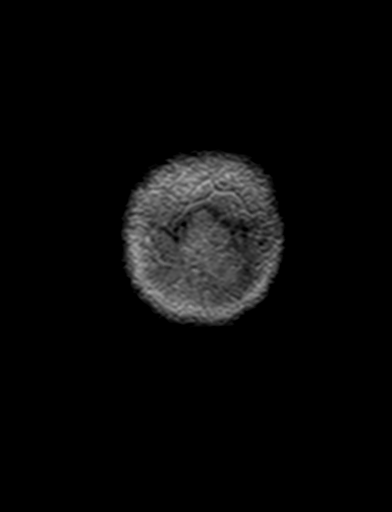

[48 of 48 positions shown; findings below may reference images not displayed]

FINDINGS: Brain: No acute infarction, hemorrhage, hydrocephalus, extra-axial
collection or mass effect.

No abnormal signal in the labyrinthine structures, internal auditory
canals, cisterns, or brainstem.

2 mm dural based nodule along the right petrous apex, likely
incidental meningioma.

Lateral ventriculomegaly, ventricular shape most suggestive of
central predominant volume loss.

Single remote microhemorrhage in the left occipital white matter,
nonspecific in isolation. Mild chronic small vessel ischemia for
age.

Vascular: Normal flow voids and vascular enhancements. No visible
venous or arterial stenosis or aberrant course.

Skull and upper cervical spine: Normal marrow signal

Sinuses/Orbits: Bilateral cataract resection.
IMPRESSION: 1. No specific cause for tinnitus.
2. Probable 2 mm meningioma at the right petrous apex.

## 2021-12-28 IMAGING — MR MR MRA NECK WO/W CM
4 of 5 series · 38 of 48 positions shown · IV contrast (13ml Multihance)
Comparison: None.

CLINICAL DATA: Pulsatile tinnitus of both ears

EXAM:
MRA NECK WITHOUT AND WITH CONTRAST
MRA HEAD WITHOUT CONTRAST
TECHNIQUE: Multiplanar and multiecho pulse sequences of the neck were obtained
without and with intravenous contrast. Angiographic images of the
neck were obtained using MRA technique without and with intravenous
contrast; Angiographic images of the Circle of Willis were obtained
using MRA technique without intravenous contrast.
CONTRAST:  13 mL MultiHance

[Series 4: fl_tof_2d · axial · 3.0mm · 0.39mm/px · z∈[-218,-39]mm · 8 of 90 slices shown]
[im 1/90]
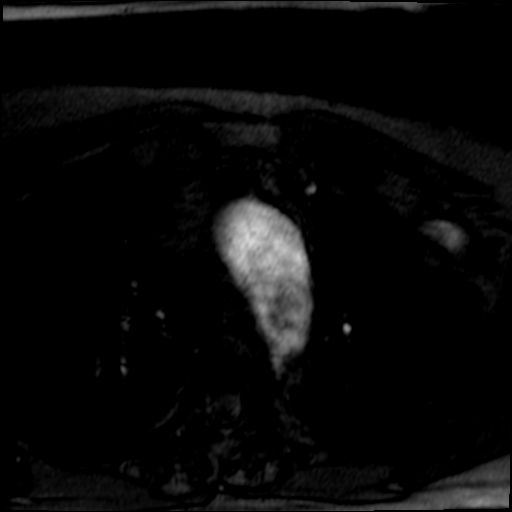
[im 13/90]
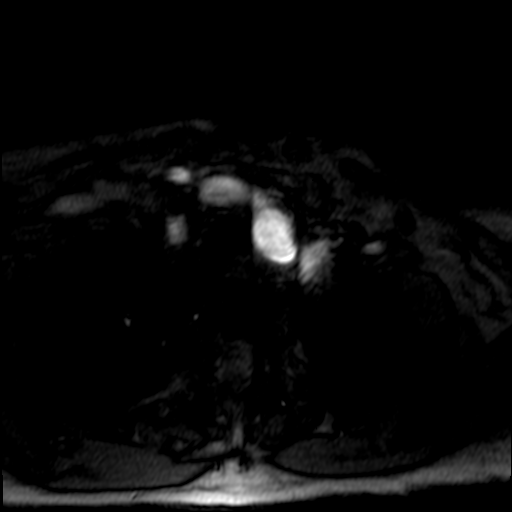
[im 26/90]
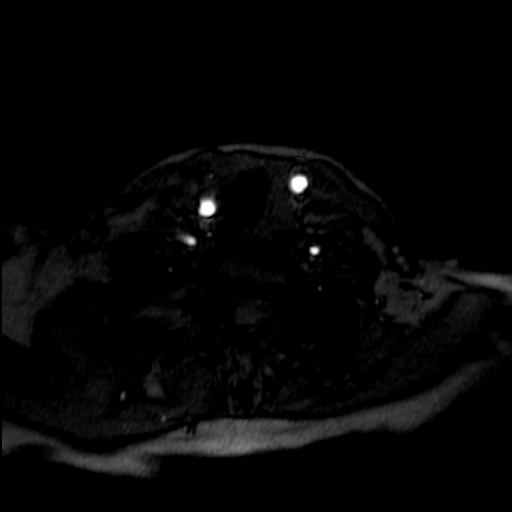
[im 39/90]
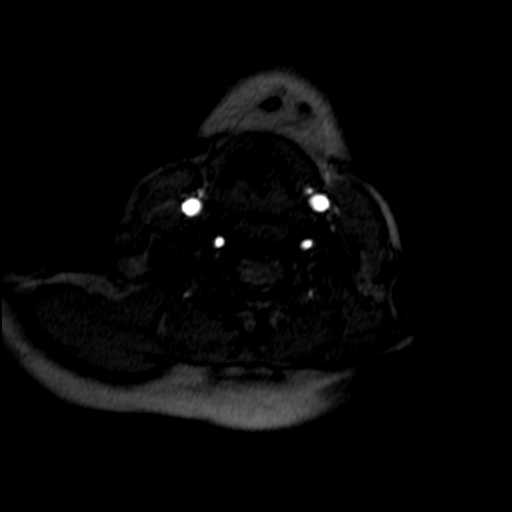
[im 51/90]
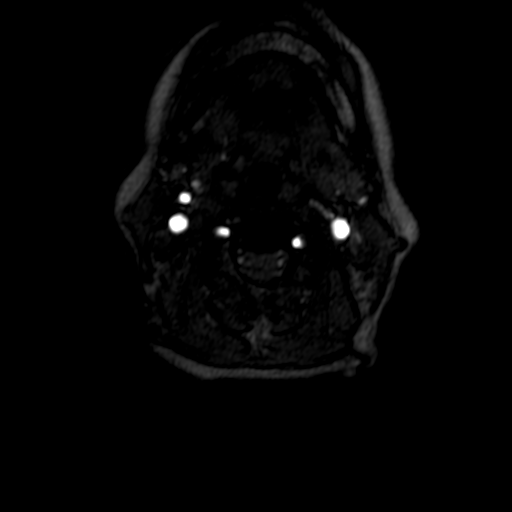
[im 64/90]
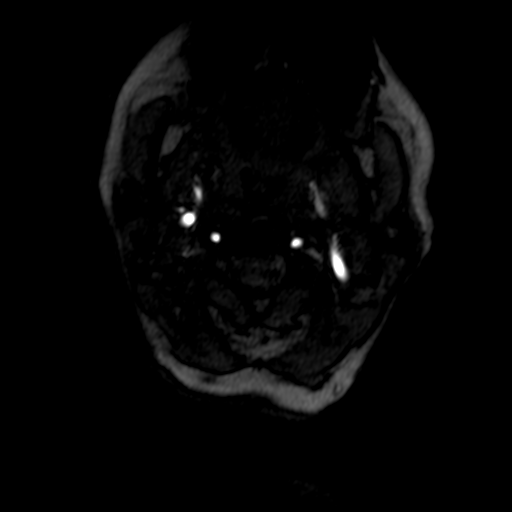
[im 77/90]
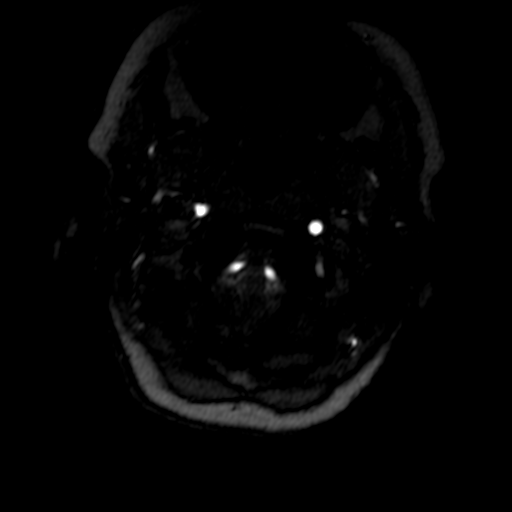
[im 90/90]
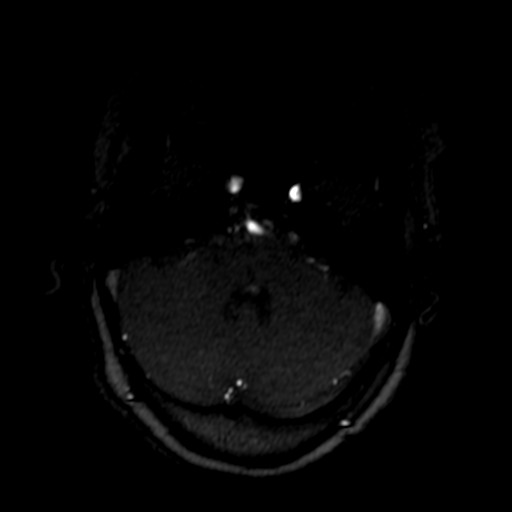

[Series 8: (id)_tt=1.0s · coronal · 0.8mm · 0.89mm/px · 12 of 144 slices shown (1 of 2)]
[im 1/144]
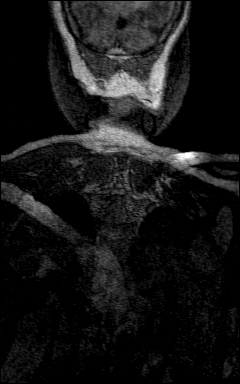
[im 14/144]
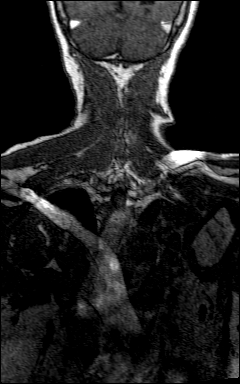
[im 27/144]
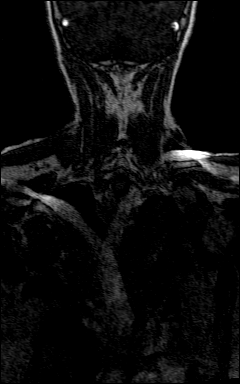
[im 40/144]
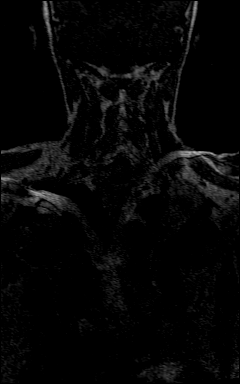
[im 53/144]
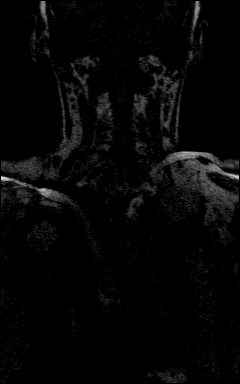
[im 66/144]
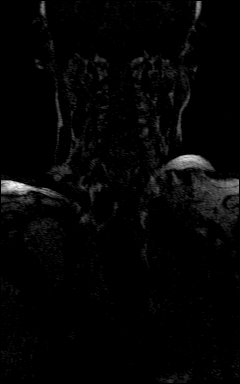
[im 79/144]
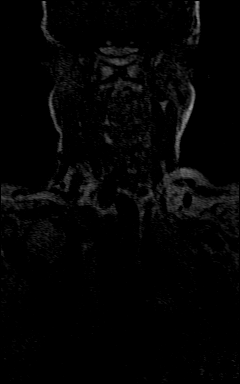
[im 92/144]
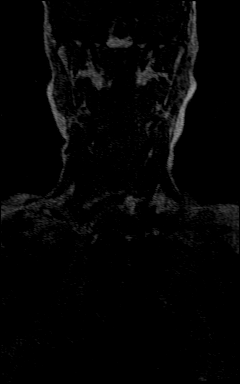
[im 105/144]
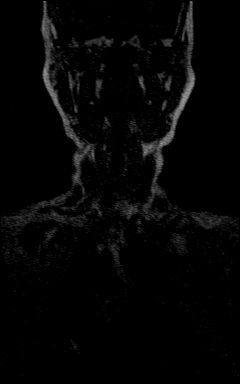
[im 118/144]
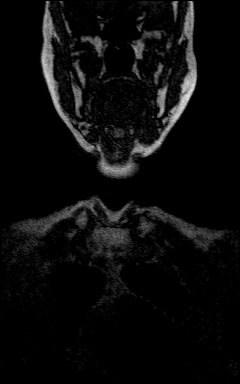
[im 131/144]
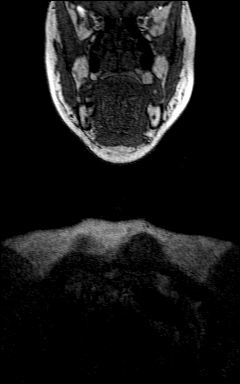
[im 144/144]
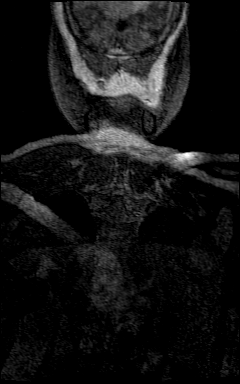

[Series 10: (id)_tt=1.0s · coronal · 0.8mm · 0.89mm/px · 9 of 144 slices shown (2 of 2)]
[im 1/144]
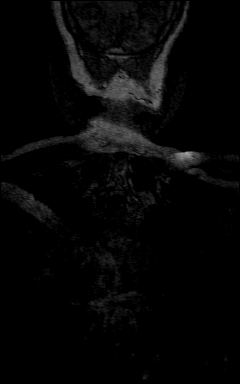
[im 27/144]
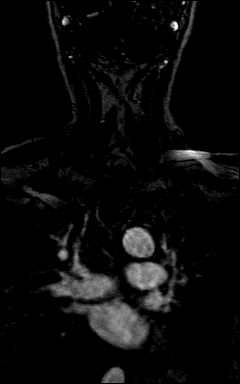
[im 40/144]
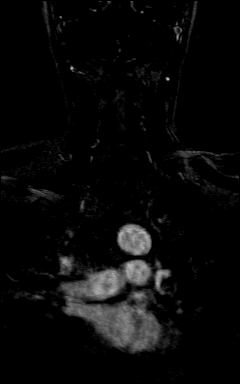
[im 66/144]
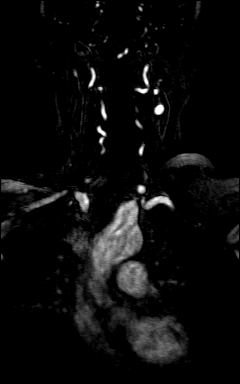
[im 79/144]
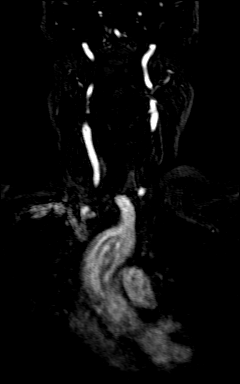
[im 105/144]
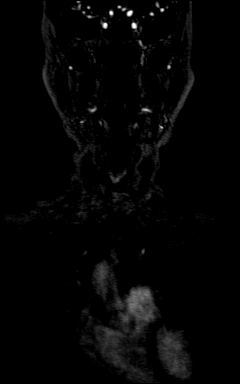
[im 118/144]
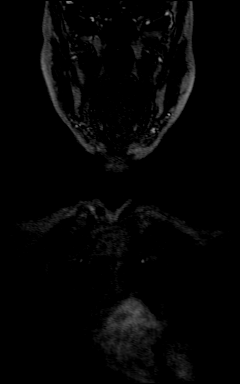
[im 131/144]
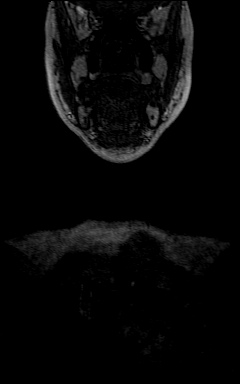
[im 144/144]
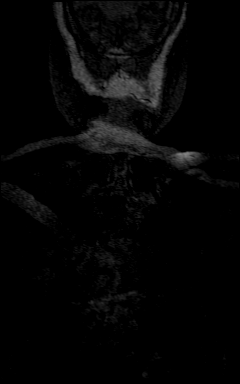

[Series 11: (id)_tt=1.0s_sub · coronal · 0.8mm · 0.89mm/px · 9 of 144 slices shown]
[im 1/144]
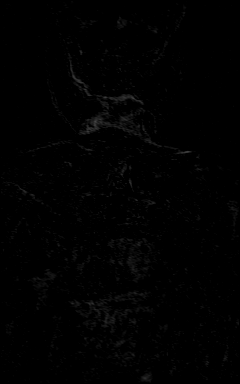
[im 27/144]
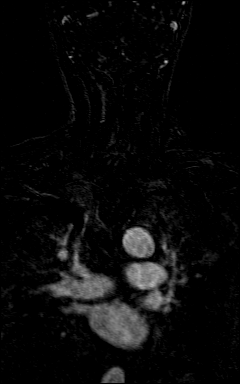
[im 40/144]
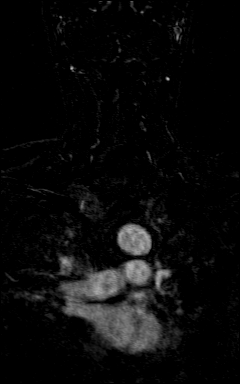
[im 66/144]
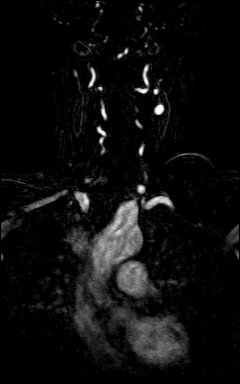
[im 79/144]
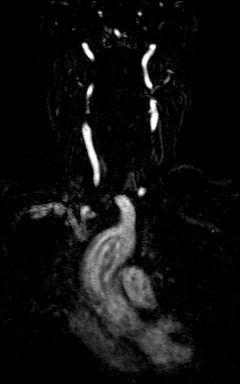
[im 105/144]
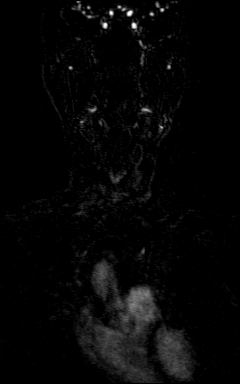
[im 118/144]
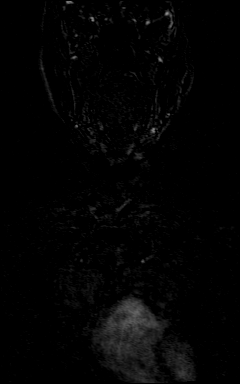
[im 131/144]
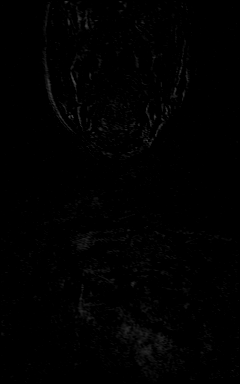
[im 144/144]
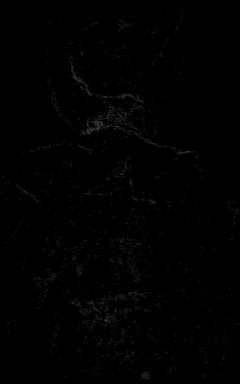

[38 of 48 positions shown; findings below may reference images not displayed]

FINDINGS: MRA NECK FINDINGS

Great vessel origins are patent. Common, internal, and external
carotid arteries are patent. Codominant extracranial vertebral
arteries are patent with mild tortuosity. No hemodynamically
significant stenosis.

MRA HEAD FINDINGS

Intracranial internal carotid arteries are patent. Anterior and
middle cerebral arteries are patent. Intracranial vertebral arteries
are patent. Basilar artery is patent. Bilateral posterior
communicating arteries are present. Posterior cerebral arteries are
patent. There is no apparent anatomy or other arterial abnormality.
IMPRESSION: Patent anterior and posterior circulations. No significant
abnormality.

## 2021-12-28 IMAGING — MR MR MRA HEAD W/O CM
1 series · 23 of 48 positions shown · IV contrast (multihance)
Comparison: None.

CLINICAL DATA: Pulsatile tinnitus of both ears

EXAM:
MRA NECK WITHOUT AND WITH CONTRAST
MRA HEAD WITHOUT CONTRAST
TECHNIQUE: Multiplanar and multiecho pulse sequences of the neck were obtained
without and with intravenous contrast. Angiographic images of the
neck were obtained using MRA technique without and with intravenous
contrast; Angiographic images of the Circle of Willis were obtained
using MRA technique without intravenous contrast.
CONTRAST:  13 mL MultiHance

[Series 3: tof_3d_multi-slab · axial · 0.7mm · 0.35mm/px · z∈[-97,+11]mm · 23 of 169 slices shown]
[im 1/169]
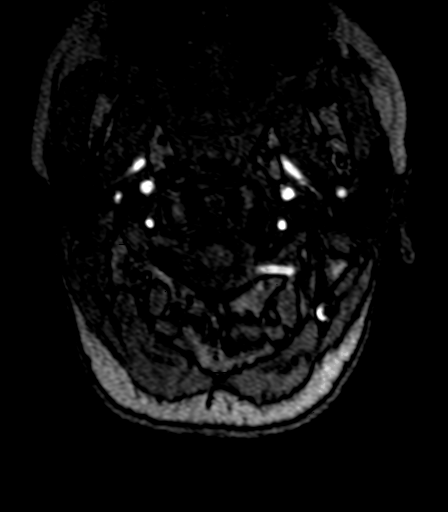
[im 4/169]
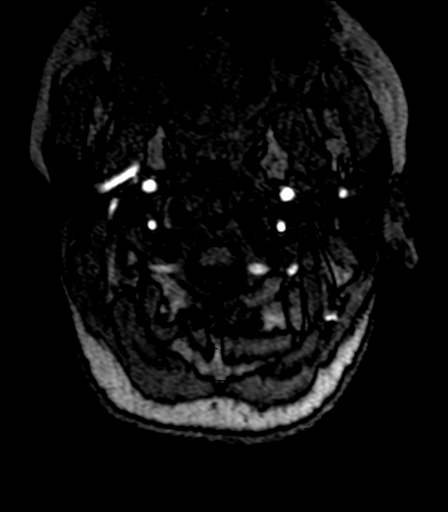
[im 8/169]
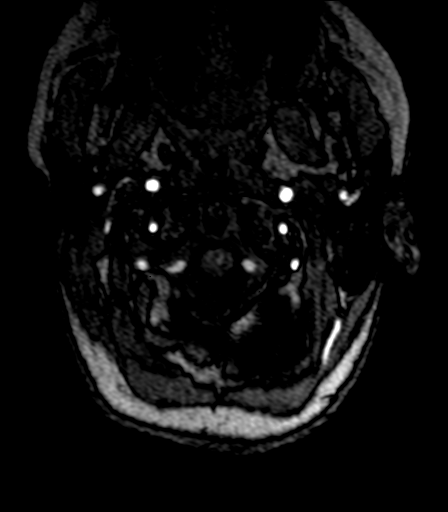
[im 11/169]
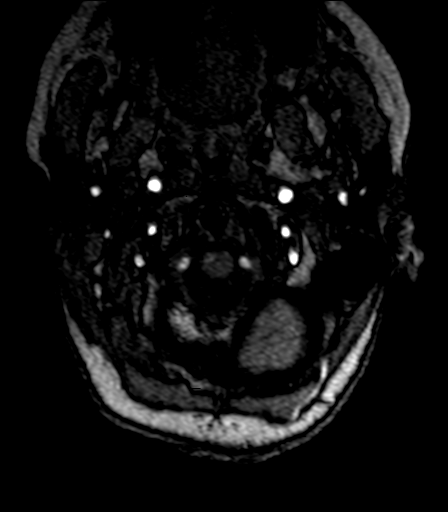
[im 15/169]
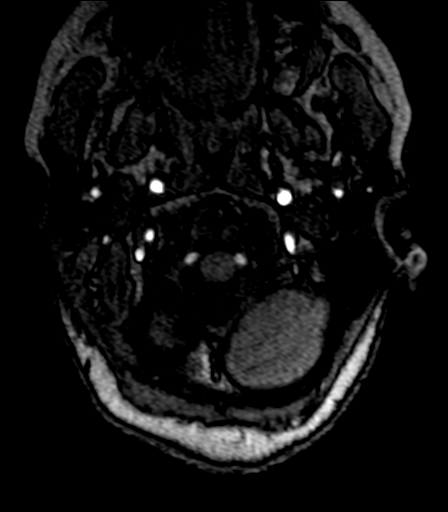
[im 18/169]
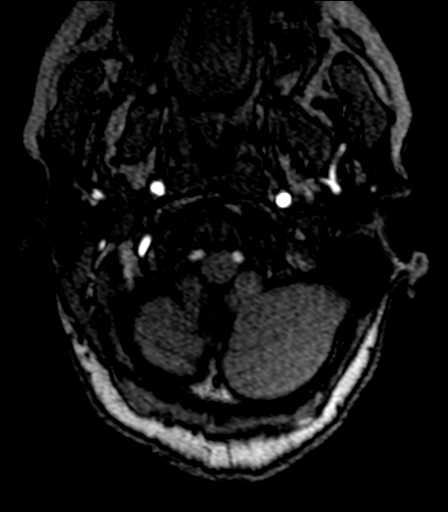
[im 22/169]
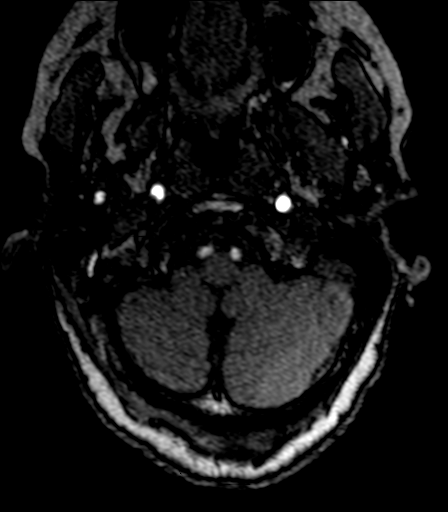
[im 26/169]
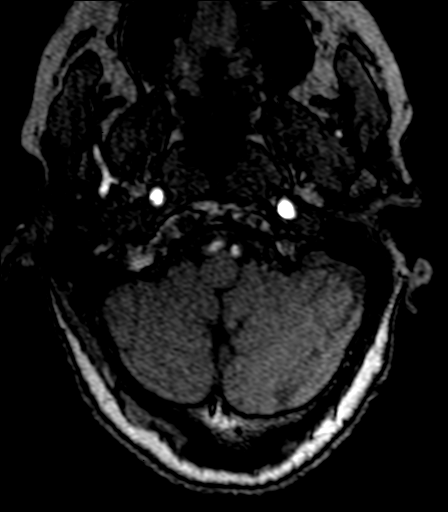
[im 29/169]
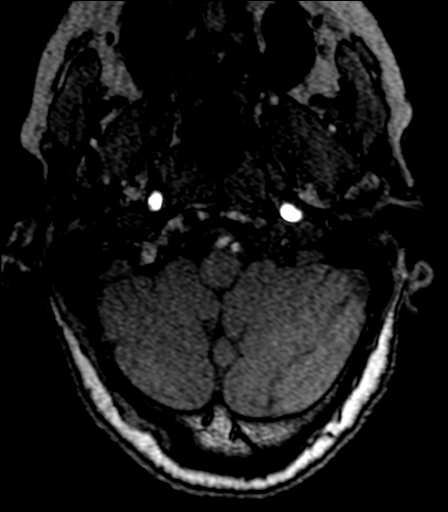
[im 33/169]
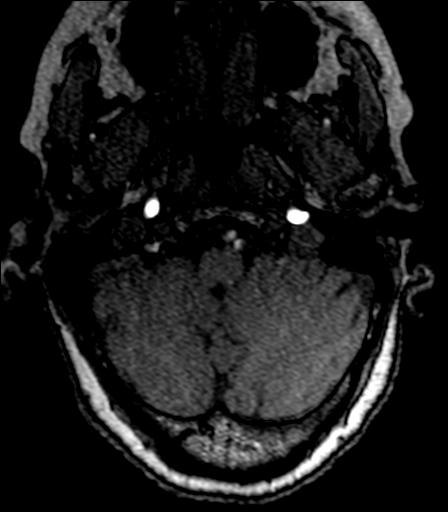
[im 36/169]
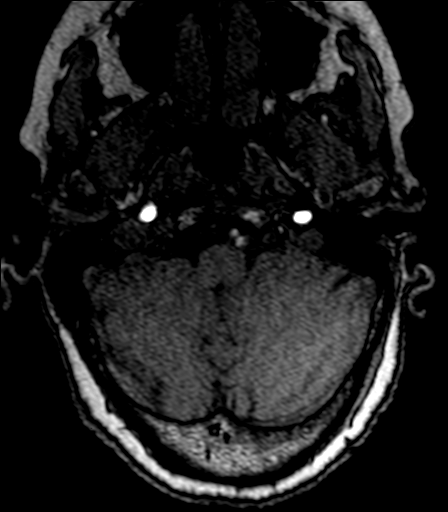
[im 40/169]
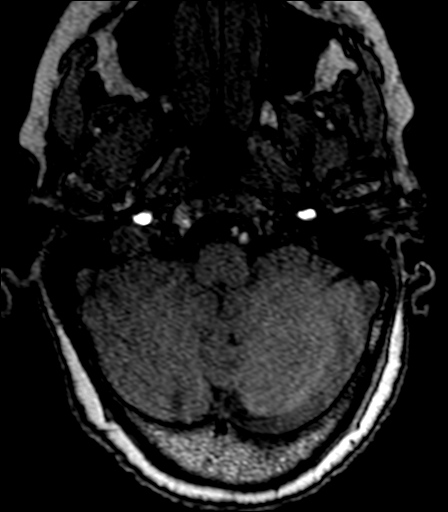
[im 43/169]
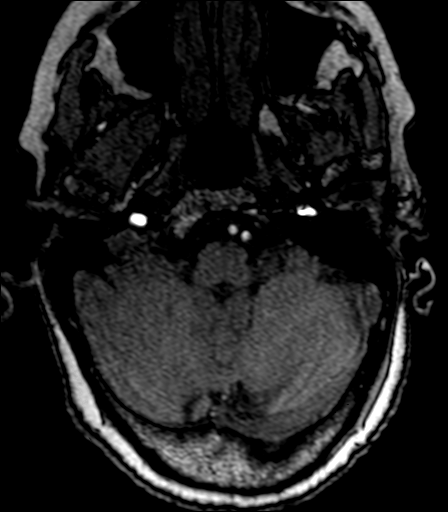
[im 47/169]
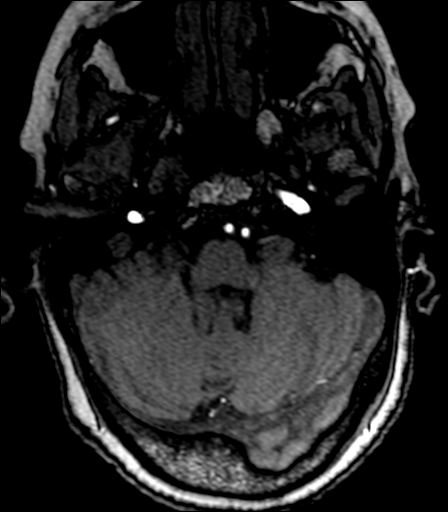
[im 51/169]
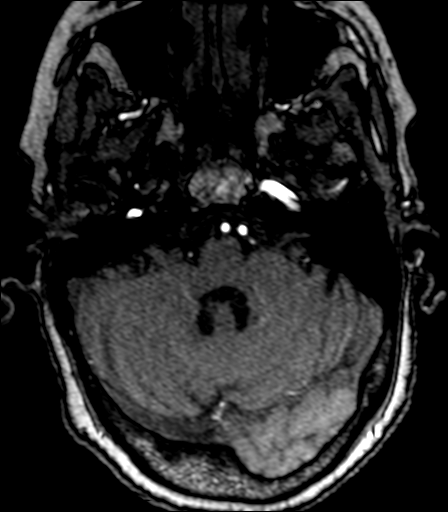
[im 54/169]
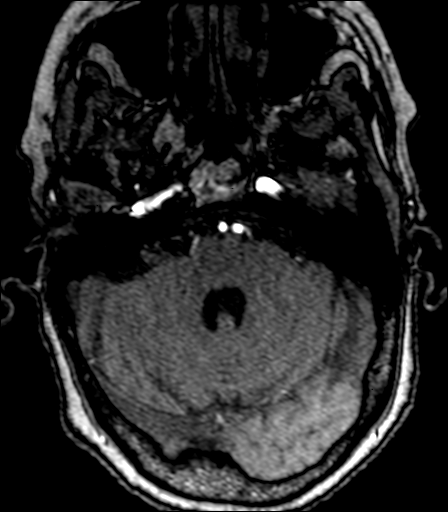
[im 76/169]
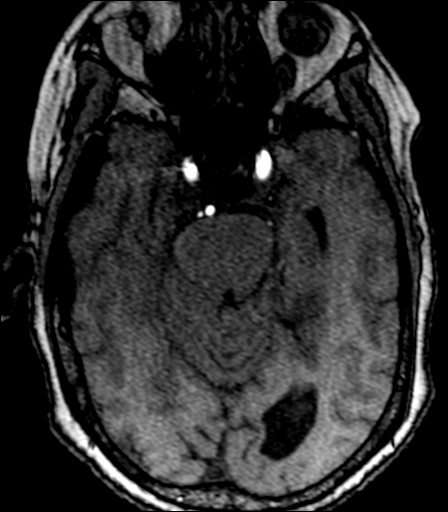
[im 86/169]
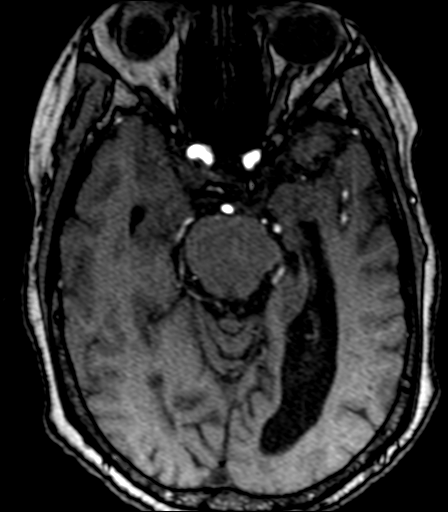
[im 97/169]
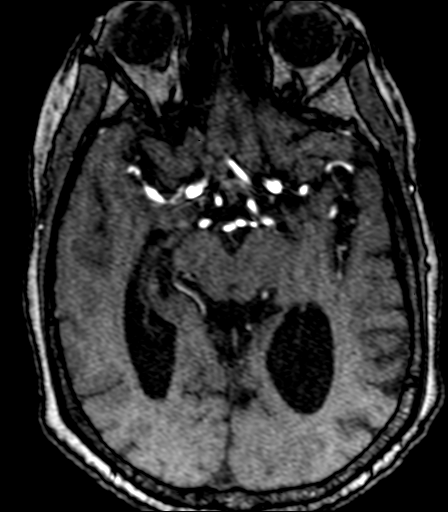
[im 118/169]
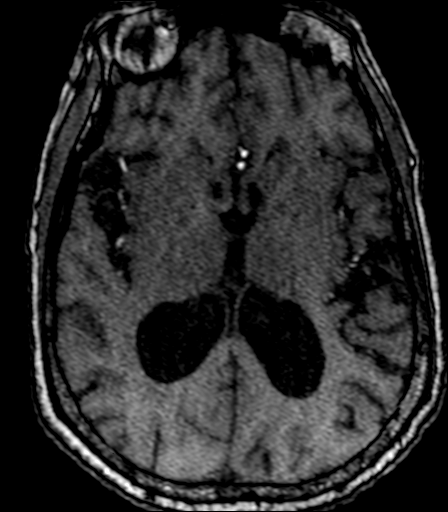
[im 140/169]
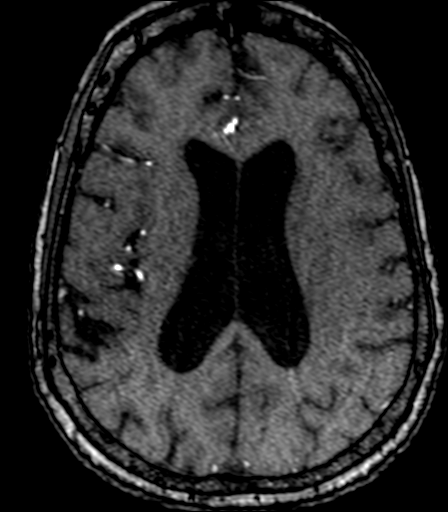
[im 143/169]
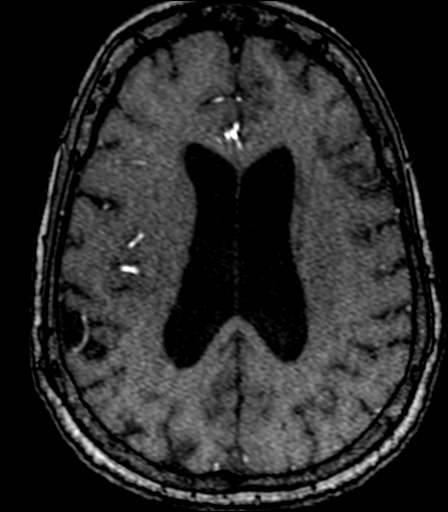
[im 161/169]
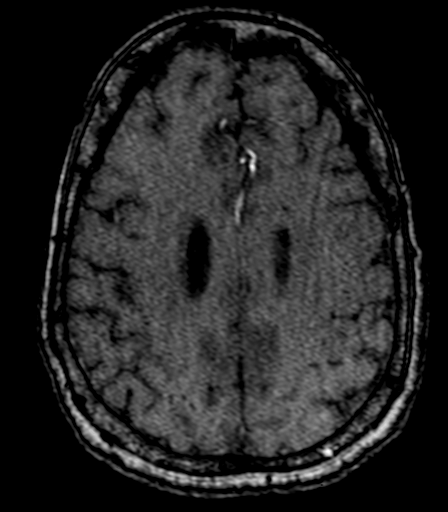

[23 of 48 positions shown; findings below may reference images not displayed]

FINDINGS: MRA NECK FINDINGS

Great vessel origins are patent. Common, internal, and external
carotid arteries are patent. Codominant extracranial vertebral
arteries are patent with mild tortuosity. No hemodynamically
significant stenosis.

MRA HEAD FINDINGS

Intracranial internal carotid arteries are patent. Anterior and
middle cerebral arteries are patent. Intracranial vertebral arteries
are patent. Basilar artery is patent. Bilateral posterior
communicating arteries are present. Posterior cerebral arteries are
patent. There is no apparent anatomy or other arterial abnormality.
IMPRESSION: Patent anterior and posterior circulations. No significant
abnormality.

## 2021-12-28 MED ORDER — GADOBENATE DIMEGLUMINE 529 MG/ML IV SOLN
13.0000 mL | Freq: Once | INTRAVENOUS | Status: AC | PRN
  Administered 2021-12-28: 13 mL via INTRAVENOUS

## 2022-02-25 DIAGNOSIS — H35379 Puckering of macula, unspecified eye: Secondary | ICD-10-CM | POA: Diagnosis not present

## 2022-02-25 DIAGNOSIS — I10 Essential (primary) hypertension: Secondary | ICD-10-CM | POA: Diagnosis not present

## 2022-02-25 DIAGNOSIS — I6523 Occlusion and stenosis of bilateral carotid arteries: Secondary | ICD-10-CM | POA: Diagnosis not present

## 2022-02-25 DIAGNOSIS — R002 Palpitations: Secondary | ICD-10-CM | POA: Diagnosis not present

## 2022-02-25 DIAGNOSIS — H409 Unspecified glaucoma: Secondary | ICD-10-CM | POA: Diagnosis not present

## 2022-02-25 DIAGNOSIS — E785 Hyperlipidemia, unspecified: Secondary | ICD-10-CM | POA: Diagnosis not present

## 2022-02-25 DIAGNOSIS — I493 Ventricular premature depolarization: Secondary | ICD-10-CM | POA: Diagnosis not present

## 2022-02-25 DIAGNOSIS — H20012 Primary iridocyclitis, left eye: Secondary | ICD-10-CM | POA: Diagnosis not present

## 2022-02-25 DIAGNOSIS — I340 Nonrheumatic mitral (valve) insufficiency: Secondary | ICD-10-CM | POA: Diagnosis not present

## 2022-03-30 DIAGNOSIS — R002 Palpitations: Secondary | ICD-10-CM | POA: Diagnosis not present

## 2022-04-10 DIAGNOSIS — Z6828 Body mass index (BMI) 28.0-28.9, adult: Secondary | ICD-10-CM | POA: Diagnosis not present

## 2022-04-10 DIAGNOSIS — E559 Vitamin D deficiency, unspecified: Secondary | ICD-10-CM | POA: Diagnosis not present

## 2022-04-10 DIAGNOSIS — I10 Essential (primary) hypertension: Secondary | ICD-10-CM | POA: Diagnosis not present

## 2022-04-10 DIAGNOSIS — R2681 Unsteadiness on feet: Secondary | ICD-10-CM | POA: Diagnosis not present

## 2022-04-10 DIAGNOSIS — R008 Other abnormalities of heart beat: Secondary | ICD-10-CM | POA: Diagnosis not present

## 2022-04-10 DIAGNOSIS — D640 Hereditary sideroblastic anemia: Secondary | ICD-10-CM | POA: Diagnosis not present

## 2022-04-10 DIAGNOSIS — I110 Hypertensive heart disease with heart failure: Secondary | ICD-10-CM | POA: Diagnosis not present

## 2022-05-07 DIAGNOSIS — I491 Atrial premature depolarization: Secondary | ICD-10-CM | POA: Diagnosis not present

## 2022-05-07 DIAGNOSIS — I493 Ventricular premature depolarization: Secondary | ICD-10-CM | POA: Diagnosis not present

## 2022-05-13 DIAGNOSIS — H401134 Primary open-angle glaucoma, bilateral, indeterminate stage: Secondary | ICD-10-CM | POA: Diagnosis not present

## 2022-05-13 DIAGNOSIS — H35033 Hypertensive retinopathy, bilateral: Secondary | ICD-10-CM | POA: Diagnosis not present

## 2022-06-15 DIAGNOSIS — I10 Essential (primary) hypertension: Secondary | ICD-10-CM | POA: Diagnosis not present

## 2022-06-15 DIAGNOSIS — R008 Other abnormalities of heart beat: Secondary | ICD-10-CM | POA: Diagnosis not present

## 2022-06-15 DIAGNOSIS — D649 Anemia, unspecified: Secondary | ICD-10-CM | POA: Diagnosis not present

## 2022-06-16 DIAGNOSIS — H401134 Primary open-angle glaucoma, bilateral, indeterminate stage: Secondary | ICD-10-CM | POA: Diagnosis not present

## 2022-06-16 DIAGNOSIS — H35033 Hypertensive retinopathy, bilateral: Secondary | ICD-10-CM | POA: Diagnosis not present

## 2022-10-13 DIAGNOSIS — Z6829 Body mass index (BMI) 29.0-29.9, adult: Secondary | ICD-10-CM | POA: Diagnosis not present

## 2022-10-13 DIAGNOSIS — D508 Other iron deficiency anemias: Secondary | ICD-10-CM | POA: Diagnosis not present

## 2022-10-13 DIAGNOSIS — E559 Vitamin D deficiency, unspecified: Secondary | ICD-10-CM | POA: Diagnosis not present

## 2022-10-13 DIAGNOSIS — R7303 Prediabetes: Secondary | ICD-10-CM | POA: Diagnosis not present

## 2022-10-13 DIAGNOSIS — I10 Essential (primary) hypertension: Secondary | ICD-10-CM | POA: Diagnosis not present

## 2022-10-13 DIAGNOSIS — E785 Hyperlipidemia, unspecified: Secondary | ICD-10-CM | POA: Diagnosis not present

## 2022-10-14 DIAGNOSIS — H35033 Hypertensive retinopathy, bilateral: Secondary | ICD-10-CM | POA: Diagnosis not present

## 2022-10-14 DIAGNOSIS — H401134 Primary open-angle glaucoma, bilateral, indeterminate stage: Secondary | ICD-10-CM | POA: Diagnosis not present

## 2023-01-12 DIAGNOSIS — G479 Sleep disorder, unspecified: Secondary | ICD-10-CM | POA: Diagnosis not present

## 2023-01-12 DIAGNOSIS — H47231 Glaucomatous optic atrophy, right eye: Secondary | ICD-10-CM | POA: Diagnosis not present

## 2023-01-12 DIAGNOSIS — I10 Essential (primary) hypertension: Secondary | ICD-10-CM | POA: Diagnosis not present

## 2023-01-12 DIAGNOSIS — E559 Vitamin D deficiency, unspecified: Secondary | ICD-10-CM | POA: Diagnosis not present

## 2023-01-12 DIAGNOSIS — D649 Anemia, unspecified: Secondary | ICD-10-CM | POA: Diagnosis not present

## 2023-01-12 DIAGNOSIS — R7303 Prediabetes: Secondary | ICD-10-CM | POA: Diagnosis not present

## 2023-01-13 DIAGNOSIS — H401134 Primary open-angle glaucoma, bilateral, indeterminate stage: Secondary | ICD-10-CM | POA: Diagnosis not present

## 2023-01-13 DIAGNOSIS — H35033 Hypertensive retinopathy, bilateral: Secondary | ICD-10-CM | POA: Diagnosis not present

## 2023-02-22 DIAGNOSIS — Z1211 Encounter for screening for malignant neoplasm of colon: Secondary | ICD-10-CM | POA: Diagnosis not present

## 2023-02-22 DIAGNOSIS — Z1212 Encounter for screening for malignant neoplasm of rectum: Secondary | ICD-10-CM | POA: Diagnosis not present

## 2023-03-13 LAB — EXTERNAL GENERIC LAB PROCEDURE: COLOGUARD: NEGATIVE

## 2023-03-13 LAB — COLOGUARD: COLOGUARD: NEGATIVE

## 2023-03-15 DIAGNOSIS — Z1231 Encounter for screening mammogram for malignant neoplasm of breast: Secondary | ICD-10-CM | POA: Diagnosis not present

## 2023-03-15 DIAGNOSIS — M810 Age-related osteoporosis without current pathological fracture: Secondary | ICD-10-CM | POA: Diagnosis not present

## 2023-05-12 DIAGNOSIS — H43811 Vitreous degeneration, right eye: Secondary | ICD-10-CM | POA: Diagnosis not present

## 2023-05-12 DIAGNOSIS — H35363 Drusen (degenerative) of macula, bilateral: Secondary | ICD-10-CM | POA: Diagnosis not present

## 2023-05-12 DIAGNOSIS — H401134 Primary open-angle glaucoma, bilateral, indeterminate stage: Secondary | ICD-10-CM | POA: Diagnosis not present

## 2023-05-12 DIAGNOSIS — H35033 Hypertensive retinopathy, bilateral: Secondary | ICD-10-CM | POA: Diagnosis not present

## 2023-05-13 DIAGNOSIS — E785 Hyperlipidemia, unspecified: Secondary | ICD-10-CM | POA: Diagnosis not present

## 2023-05-13 DIAGNOSIS — G479 Sleep disorder, unspecified: Secondary | ICD-10-CM | POA: Diagnosis not present

## 2023-05-13 DIAGNOSIS — R7303 Prediabetes: Secondary | ICD-10-CM | POA: Diagnosis not present

## 2023-05-13 DIAGNOSIS — I10 Essential (primary) hypertension: Secondary | ICD-10-CM | POA: Diagnosis not present

## 2023-09-07 DIAGNOSIS — H401134 Primary open-angle glaucoma, bilateral, indeterminate stage: Secondary | ICD-10-CM | POA: Diagnosis not present

## 2023-09-07 DIAGNOSIS — H35363 Drusen (degenerative) of macula, bilateral: Secondary | ICD-10-CM | POA: Diagnosis not present

## 2023-09-07 DIAGNOSIS — H43811 Vitreous degeneration, right eye: Secondary | ICD-10-CM | POA: Diagnosis not present

## 2023-09-07 DIAGNOSIS — H35033 Hypertensive retinopathy, bilateral: Secondary | ICD-10-CM | POA: Diagnosis not present

## 2023-09-08 DIAGNOSIS — R634 Abnormal weight loss: Secondary | ICD-10-CM | POA: Diagnosis not present

## 2023-09-08 DIAGNOSIS — D649 Anemia, unspecified: Secondary | ICD-10-CM | POA: Diagnosis not present

## 2023-09-08 DIAGNOSIS — I10 Essential (primary) hypertension: Secondary | ICD-10-CM | POA: Diagnosis not present

## 2023-09-08 DIAGNOSIS — Z0000 Encounter for general adult medical examination without abnormal findings: Secondary | ICD-10-CM | POA: Diagnosis not present

## 2023-09-08 DIAGNOSIS — E785 Hyperlipidemia, unspecified: Secondary | ICD-10-CM | POA: Diagnosis not present

## 2023-09-08 DIAGNOSIS — Z6824 Body mass index (BMI) 24.0-24.9, adult: Secondary | ICD-10-CM | POA: Diagnosis not present

## 2023-09-08 DIAGNOSIS — E559 Vitamin D deficiency, unspecified: Secondary | ICD-10-CM | POA: Diagnosis not present

## 2023-09-08 DIAGNOSIS — R7303 Prediabetes: Secondary | ICD-10-CM | POA: Diagnosis not present

## 2023-10-17 ENCOUNTER — Emergency Department (HOSPITAL_COMMUNITY): Payer: Medicare PPO

## 2023-10-17 ENCOUNTER — Encounter (HOSPITAL_COMMUNITY): Payer: Self-pay | Admitting: Internal Medicine

## 2023-10-17 ENCOUNTER — Observation Stay (HOSPITAL_COMMUNITY)
Admission: EM | Admit: 2023-10-17 | Discharge: 2023-10-18 | Disposition: A | Discharge status: Home / Self Care | Payer: Medicare PPO | Attending: Internal Medicine | Admitting: Internal Medicine

## 2023-10-17 ENCOUNTER — Other Ambulatory Visit: Payer: Self-pay

## 2023-10-17 DIAGNOSIS — I499 Cardiac arrhythmia, unspecified: Secondary | ICD-10-CM | POA: Diagnosis not present

## 2023-10-17 DIAGNOSIS — R531 Weakness: Secondary | ICD-10-CM | POA: Diagnosis not present

## 2023-10-17 DIAGNOSIS — Z79899 Other long term (current) drug therapy: Secondary | ICD-10-CM | POA: Diagnosis not present

## 2023-10-17 DIAGNOSIS — D649 Anemia, unspecified: Secondary | ICD-10-CM | POA: Diagnosis not present

## 2023-10-17 DIAGNOSIS — R4020 Unspecified coma: Secondary | ICD-10-CM | POA: Diagnosis not present

## 2023-10-17 DIAGNOSIS — R55 Syncope and collapse: Principal | ICD-10-CM | POA: Insufficient documentation

## 2023-10-17 DIAGNOSIS — R42 Dizziness and giddiness: Secondary | ICD-10-CM | POA: Diagnosis not present

## 2023-10-17 DIAGNOSIS — R0902 Hypoxemia: Secondary | ICD-10-CM | POA: Diagnosis not present

## 2023-10-17 DIAGNOSIS — I129 Hypertensive chronic kidney disease with stage 1 through stage 4 chronic kidney disease, or unspecified chronic kidney disease: Secondary | ICD-10-CM | POA: Insufficient documentation

## 2023-10-17 DIAGNOSIS — R0689 Other abnormalities of breathing: Secondary | ICD-10-CM | POA: Diagnosis not present

## 2023-10-17 DIAGNOSIS — R569 Unspecified convulsions: Secondary | ICD-10-CM | POA: Diagnosis not present

## 2023-10-17 DIAGNOSIS — N1831 Chronic kidney disease, stage 3a: Secondary | ICD-10-CM | POA: Insufficient documentation

## 2023-10-17 DIAGNOSIS — I700 Atherosclerosis of aorta: Secondary | ICD-10-CM | POA: Diagnosis not present

## 2023-10-17 HISTORY — DX: Essential (primary) hypertension: I10

## 2023-10-17 LAB — COMPREHENSIVE METABOLIC PANEL
ALT: 14 U/L (ref 0–44)
AST: 21 U/L (ref 15–41)
Albumin: 3.2 g/dL — ABNORMAL LOW (ref 3.5–5.0)
Alkaline Phosphatase: 77 U/L (ref 38–126)
Anion gap: 11 (ref 5–15)
BUN: 18 mg/dL (ref 8–23)
CO2: 21 mmol/L — ABNORMAL LOW (ref 22–32)
Calcium: 9.1 mg/dL (ref 8.9–10.3)
Chloride: 107 mmol/L (ref 98–111)
Creatinine, Ser: 0.97 mg/dL (ref 0.44–1.00)
GFR, Estimated: 59 mL/min — ABNORMAL LOW (ref >60)
Glucose, Bld: 104 mg/dL — ABNORMAL HIGH (ref 70–99)
Potassium: 4 mmol/L (ref 3.5–5.1)
Sodium: 139 mmol/L (ref 135–145)
Total Bilirubin: 0.6 mg/dL (ref <1.2)
Total Protein: 7.5 g/dL (ref 6.5–8.1)

## 2023-10-17 LAB — CBC WITH DIFFERENTIAL/PLATELET
Abs Immature Granulocytes: 0.07 10*3/uL (ref 0.00–0.07)
Basophils Absolute: 0 10*3/uL (ref 0.0–0.1)
Basophils Relative: 0 %
Eosinophils Absolute: 0.1 10*3/uL (ref 0.0–0.5)
Eosinophils Relative: 1 %
HCT: 28.7 % — ABNORMAL LOW (ref 36.0–46.0)
Hemoglobin: 9 g/dL — ABNORMAL LOW (ref 12.0–15.0)
Immature Granulocytes: 1 %
Lymphocytes Relative: 16 %
Lymphs Abs: 1.9 10*3/uL (ref 0.7–4.0)
MCH: 27.5 pg (ref 26.0–34.0)
MCHC: 31.4 g/dL (ref 30.0–36.0)
MCV: 87.8 fL (ref 80.0–100.0)
Monocytes Absolute: 1.1 10*3/uL — ABNORMAL HIGH (ref 0.1–1.0)
Monocytes Relative: 9 %
Neutro Abs: 9.1 10*3/uL — ABNORMAL HIGH (ref 1.7–7.7)
Neutrophils Relative %: 73 %
Platelets: 354 10*3/uL (ref 150–400)
RBC: 3.27 MIL/uL — ABNORMAL LOW (ref 3.87–5.11)
RDW: 13.2 % (ref 11.5–15.5)
WBC: 12.3 10*3/uL — ABNORMAL HIGH (ref 4.0–10.5)
nRBC: 0 % (ref 0.0–0.2)

## 2023-10-17 LAB — TROPONIN I (HIGH SENSITIVITY)
Troponin I (High Sensitivity): 3 ng/L (ref <18)
Troponin I (High Sensitivity): 3 ng/L (ref <18)

## 2023-10-17 LAB — MAGNESIUM: Magnesium: 2.1 mg/dL (ref 1.7–2.4)

## 2023-10-17 MED ORDER — PROCHLORPERAZINE EDISYLATE 10 MG/2ML IJ SOLN: 5.0000 mg | Freq: Four times a day (QID) | INTRAMUSCULAR | Status: DC | PRN

## 2023-10-17 MED ORDER — MELATONIN 5 MG PO TABS: 5.0000 mg | ORAL_TABLET | Freq: Every evening | ORAL | Status: DC | PRN

## 2023-10-17 MED ORDER — ENOXAPARIN SODIUM 40 MG/0.4ML IJ SOSY
40.0000 mg | PREFILLED_SYRINGE | INTRAMUSCULAR | Status: DC
  Administered 2023-10-18: 40 mg via SUBCUTANEOUS
  Filled 2023-10-17: qty 0.4

## 2023-10-17 MED ORDER — ACETAMINOPHEN 325 MG PO TABS: 650.0000 mg | ORAL_TABLET | Freq: Four times a day (QID) | ORAL | Status: DC | PRN

## 2023-10-17 MED ORDER — POLYETHYLENE GLYCOL 3350 17 G PO PACK: 17.0000 g | PACK | Freq: Every day | ORAL | Status: DC | PRN

## 2023-10-17 MED ORDER — SODIUM CHLORIDE 0.9 % IV SOLN
INTRAVENOUS | Status: DC
Start: 2023-10-17 — End: 2023-10-18

## 2023-10-17 NOTE — ED Notes (Signed)
Provided pt with specimen cup and encouraged to provide sample. Pt stated unable to provide one at this time.

## 2023-10-17 NOTE — ED Triage Notes (Signed)
Pt BIB [facility] from home. EMS reports pt was standing and began to get dizzy and went to sit down and became unresponsive and began staring off and drooling. Pt also urinated on herself. Upon EMS arrival 84/49 BP. Pt does not remember the incident. Pt is A&Ox4. GCS 15.   EMS  163 CBG 70 P 131/70 BP 99% RA 20 LAC

## 2023-10-17 NOTE — H&P (Incomplete)
History and Physical  Tiffany [name] VHQ:[id number] DOB: 05-29-1941 DOA: 10/17/2023  Referring physician: Rush Landmark  PCP: Georgiann Cocker, FNP  Outpatient Specialists: Cardiology  Patient coming from: Home  Chief Complaint: Witnessed syncope   HPI: Tiffany [name] is a 82 y.o. female with medical history significant for supraventricular tachycardia on metoprolol, who presents from home via EMS after a witnessed syncopal episode.  The patient was cooking then felt dizzy she sat down.  After her dizziness resolved she she Thayer Jew To finish cooking became dizzy again and sat down.  While she was seated she lost consciousness.  This was witnessed by family members in the room.  No reported chest pain, shortness of breath, nausea or diaphoresis.  Reportedly the patient became unresponsive after she sat down for the second time and began staring off and falling also had urinary incontinence at that time.  EMS was activated and the patient was brought to the ED for further evaluation.  In the ED, the patient was alert and oriented x 4 with a GCS of 15.  She does not remember the event.  Vital signs are stable.  UA is pending.  EDP requested admission for further evaluation.  Admitted by [facility], hospitalist service.  ED Course: Blood pressure 142/77, pulse 81, respiration rate 12, O2 saturation 100% on room air.  Lab studies notable for serum bicarb 21, glucose 104, albumin 3.2, GFR 59, troponin 3, repeat 3.  WBC 12.3, hemoglobin 9.0, MCV 87.  Platelet count 354.  Neutrophil count 9.1.  Review of Systems: Review of systems as noted in the HPI. All other systems reviewed and are negative.   Past Medical History:  Diagnosis Date  . Hypertension    No past surgical history on file.  Social History:  has no history on file for tobacco use, alcohol use, and drug use.   Allergies  Allergen Reactions  . Elemental Sulfur Hives  . Shellfish Allergy Anaphylaxis  . Sulfa Antibiotics Hives    Family  history: None reported.  Prior to Admission medications   Medication Sig Start Date End Date Taking? Authorizing Provider  alendronate (FOSAMAX) 70 MG tablet Take 70 mg by mouth once a week. 09/09/23  Yes [provider]  bimatoprost (LUMIGAN) 0.03 % ophthalmic solution Place 1 drop into both eyes at bedtime.   Yes [provider]  D3-50 1.25 MG (50000 UT) capsule Take 50,000 Units by mouth once a week. On mondays 09/09/23  Yes [provider]  dorzolamide (TRUSOPT) 2 % ophthalmic solution Place 1 drop into both eyes 2 (two) times daily.   Yes [provider]  metoprolol tartrate (LOPRESSOR) 50 MG tablet Take 50 mg by mouth daily. 09/09/23  Yes [provider]    Physical Exam: BP 120/74   Pulse 79   Resp 17   Ht 5\' 2"  (1.575 m)   Wt 59 kg   SpO2 100%   BMI 23.78 kg/m   General: 82 y.o. year-old female well developed well nourished in no acute distress.  Alert and oriented x3. Cardiovascular: Regular rate and rhythm with no rubs or gallops.  No thyromegaly or JVD noted.  No lower extremity edema. 2/4 pulses in all 4 extremities. Respiratory: Clear to auscultation with no wheezes or rales. Good inspiratory effort. Abdomen: Soft nontender nondistended with normal bowel sounds x4 quadrants. Muskuloskeletal: No cyanosis, clubbing or edema noted bilaterally Neuro: CN II-XII intact, strength, sensation, reflexes Skin: No ulcerative lesions noted or rashes Psychiatry: Judgement and insight appear normal.  Mood is appropriate for condition and setting          Labs on Admission:  Basic Metabolic Panel: Recent Labs  Lab 10/17/23 2041  NA 139  K 4.0  CL 107  CO2 21*  GLUCOSE 104*  BUN 18  CREATININE 0.97  CALCIUM 9.1  MG 2.1   Liver Function Tests: Recent Labs  Lab 10/17/23 2041  AST 21  ALT 14  ALKPHOS 77  BILITOT 0.6  PROT 7.5  ALBUMIN 3.2*   No results for input(s): "LIPASE", "AMYLASE" in the last 168 hours. No results for  input(s): "AMMONIA" in the last 168 hours. CBC: Recent Labs  Lab 10/17/23 2041  WBC 12.3*  NEUTROABS 9.1*  HGB 9.0*  HCT 28.7*  MCV 87.8  PLT 354   Cardiac Enzymes: No results for input(s): "CKTOTAL", "CKMB", "CKMBINDEX", "TROPONINI" in the last 168 hours.  BNP (last 3 results) No results for input(s): "BNP" in the last 8760 hours.  ProBNP (last 3 results) No results for input(s): "PROBNP" in the last 8760 hours.  CBG: No results for input(s): "GLUCAP" in the last 168 hours.  Radiological Exams on Admission: DG Chest Portable 1 View Result Date: 10/17/2023 CLINICAL DATA:  Loss of consciousness and dizziness EXAM: PORTABLE CHEST 1 VIEW COMPARISON:  06/20/2021 FINDINGS: Stable cardiomediastinal silhouette. Aortic atherosclerotic calcification. No focal consolidation, pleural effusion, or pneumothorax. No displaced rib fractures. IMPRESSION: No acute cardiopulmonary disease. Electronically Signed   By: Minerva Fester M.D.   On: 10/17/2023 20:33    EKG: I independently viewed the EKG done and my findings are as followed: Sinus rhythm rate of 80.  Nonspecific ST-T changes.  QTc 423.  Assessment/Plan Present on Admission: . 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 Monitor urine output.  Non anion gap metabolic acidosis Serum bicarb 21 anion gap monitor 11 Continue gentle IV fluid hydration Replete repeat lab work in the morning  Leukocytosis Presented with WBC 12.3 Possibly reactive Follow UA Chest x-ray was nonacute Repeat CBC in the morning monitor fever curve and WBCs  Normocytic anemia, unclear etiology Hemoglobin 9.0 Continue  to monitor H&H No reported overt bleeding  Generalized weakness PT OT assessment Fall precautions.   Time: 75 minutes.   DVT prophylaxis: Subcu Lovenox daily  Code Status: Full code  Family Communication: None at bedside.  Disposition Plan: Admitted to telemetry medical unit  Consults called: None.  Admission status: Observation status.   Status is: Observation    Darlin Drop MD [facility] Pager [phone]  If 7PM-7AM, please contact night-coverage [email or web address] Password [facility]  10/17/2023, 10:53 PM

## 2023-10-17 NOTE — H&P (Signed)
History and Physical  Minsa Wiemer ZOX:[id number] DOB: Aug 26, 1941 DOA: 10/17/2023  Referring physician: Rush Landmark  PCP: Georgiann Cocker, FNP  Outpatient Specialists: Cardiology  Patient coming from: Home  Chief Complaint: Witnessed syncope   HPI: Aruna Berarducci is a 82 y.o. female with medical history significant for supraventricular tachycardia on metoprolol, who presents from home via EMS after a witnessed syncopal episode.  The patient was cooking then felt dizzy she sat down.  After her dizziness resolved she she Thayer Jew To finish cooking became dizzy again and sat down.  While she was seated she lost consciousness.  This was witnessed by family members in the room.  No reported chest pain, shortness of breath, nausea or diaphoresis.  EMS was activated and the patient was brought to the ED for further evaluation.          ED Course: ***  Review of Systems: Review of systems as noted in the HPI. All other systems reviewed and are negative.   Past Medical History:  Diagnosis Date   Hypertension    No past surgical history on file.  Social History:  has no history on file for tobacco use, alcohol use, and drug use.   Allergies  Allergen Reactions   Elemental Sulfur Hives   Shellfish Allergy Anaphylaxis   Sulfa Antibiotics Hives    No family history on file.  ***  Prior to Admission medications   Medication Sig Start Date End Date Taking? Authorizing Provider  alendronate (FOSAMAX) 70 MG tablet Take 70 mg by mouth once a week. 09/09/23  Yes [provider]  bimatoprost (LUMIGAN) 0.03 % ophthalmic solution Place 1 drop into both eyes at bedtime.   Yes [provider]  D3-50 1.25 MG (50000 UT) capsule Take 50,000 Units by mouth once a week. On mondays 09/09/23  Yes [provider]  dorzolamide (TRUSOPT) 2 % ophthalmic solution Place 1 drop into both eyes 2 (two) times daily.   Yes [provider]  metoprolol tartrate (LOPRESSOR)  50 MG tablet Take 50 mg by mouth daily. 09/09/23  Yes [provider]    Physical Exam: BP 120/74   Pulse 79   Resp 17   Ht 5\' 2"  (1.575 m)   Wt 59 kg   SpO2 100%   BMI 23.78 kg/m   General: 82 y.o. year-old female well developed well nourished in no acute distress.  Alert and oriented x3. Cardiovascular: Regular rate and rhythm with no rubs or gallops.  No thyromegaly or JVD noted.  No lower extremity edema. 2/4 pulses in all 4 extremities. Respiratory: Clear to auscultation with no wheezes or rales. Good inspiratory effort. Abdomen: Soft nontender nondistended with normal bowel sounds x4 quadrants. Muskuloskeletal: No cyanosis, clubbing or edema noted bilaterally Neuro: CN II-XII intact, strength, sensation, reflexes Skin: No ulcerative lesions noted or rashes Psychiatry: Judgement and insight appear normal. Mood is appropriate for condition and setting          Labs on Admission:  Basic Metabolic Panel: Recent Labs  Lab 10/17/23 2041  NA 139  K 4.0  CL 107  CO2 21*  GLUCOSE 104*  BUN 18  CREATININE 0.97  CALCIUM 9.1  MG 2.1   Liver Function Tests: Recent Labs  Lab 10/17/23 2041  AST 21  ALT 14  ALKPHOS 77  BILITOT 0.6  PROT 7.5  ALBUMIN 3.2*   No results for input(s): "LIPASE", "AMYLASE" in the last 168 hours. No results for input(s): "AMMONIA" in the last 168 hours.  CBC: Recent Labs  Lab 10/17/23 2041  WBC 12.3*  NEUTROABS 9.1*  HGB 9.0*  HCT 28.7*  MCV 87.8  PLT 354   Cardiac Enzymes: No results for input(s): "CKTOTAL", "CKMB", "CKMBINDEX", "TROPONINI" in the last 168 hours.  BNP (last 3 results) No results for input(s): "BNP" in the last 8760 hours.  ProBNP (last 3 results) No results for input(s): "PROBNP" in the last 8760 hours.  CBG: No results for input(s): "GLUCAP" in the last 168 hours.  Radiological Exams on Admission: DG Chest Portable 1 View Result Date: 10/17/2023 CLINICAL DATA:  Loss of consciousness and dizziness  EXAM: PORTABLE CHEST 1 VIEW COMPARISON:  06/20/2021 FINDINGS: Stable cardiomediastinal silhouette. Aortic atherosclerotic calcification. No focal consolidation, pleural effusion, or pneumothorax. No displaced rib fractures. IMPRESSION: No acute cardiopulmonary disease. Electronically Signed   By: Minerva Fester M.D.   On: 10/17/2023 20:33    EKG: I independently viewed the EKG done and my findings are as followed: ***   Assessment/Plan Present on Admission:  Syncope  Principal Problem:   Syncope   DVT prophylaxis: ***   Code Status: ***   Family Communication: ***   Disposition Plan: ***   Consults called: ***   Admission status: ***    Status is: Observation {Observation:23811}   Darlin Drop MD [facility] Pager [phone]  If 7PM-7AM, please contact night-coverage [email or web address] Password [facility]  10/17/2023, 10:53 PM

## 2023-10-18 ENCOUNTER — Observation Stay (HOSPITAL_BASED_OUTPATIENT_CLINIC_OR_DEPARTMENT_OTHER): Payer: Medicare PPO

## 2023-10-18 DIAGNOSIS — R55 Syncope and collapse: Secondary | ICD-10-CM | POA: Diagnosis not present

## 2023-10-18 LAB — CBC
HCT: 25.5 % — ABNORMAL LOW (ref 36.0–46.0)
Hemoglobin: 8.4 g/dL — ABNORMAL LOW (ref 12.0–15.0)
MCH: 28.5 pg (ref 26.0–34.0)
MCHC: 32.9 g/dL (ref 30.0–36.0)
MCV: 86.4 fL (ref 80.0–100.0)
Platelets: 304 10*3/uL (ref 150–400)
RBC: 2.95 MIL/uL — ABNORMAL LOW (ref 3.87–5.11)
RDW: 13.2 % (ref 11.5–15.5)
WBC: 10.9 10*3/uL — ABNORMAL HIGH (ref 4.0–10.5)
nRBC: 0 % (ref 0.0–0.2)

## 2023-10-18 LAB — URINALYSIS, W/ REFLEX TO CULTURE (INFECTION SUSPECTED)
Bacteria, UA: NONE SEEN
Bilirubin Urine: NEGATIVE
Glucose, UA: NEGATIVE mg/dL
Hgb urine dipstick: NEGATIVE
Ketones, ur: 20 mg/dL — AB
Leukocytes,Ua: NEGATIVE
Nitrite: NEGATIVE
Protein, ur: NEGATIVE mg/dL
Specific Gravity, Urine: 1.016 (ref 1.005–1.030)
pH: 6 (ref 5.0–8.0)

## 2023-10-18 LAB — ECHOCARDIOGRAM COMPLETE
AR max vel: 2.49 cm2
AV Area VTI: 2.58 cm2
AV Area mean vel: 2.47 cm2
AV Mean grad: 5 mm[Hg]
AV Peak grad: 8.8 mm[Hg]
AV Vena cont: 0.3 cm
Ao pk vel: 1.48 m/s
Area-P 1/2: 3.91 cm2
Height: 60 in
S' Lateral: 2.7 cm
Weight: 1964.74 [oz_av]

## 2023-10-18 LAB — BASIC METABOLIC PANEL
Anion gap: 10 (ref 5–15)
BUN: 15 mg/dL (ref 8–23)
CO2: 21 mmol/L — ABNORMAL LOW (ref 22–32)
Calcium: 9.1 mg/dL (ref 8.9–10.3)
Chloride: 109 mmol/L (ref 98–111)
Creatinine, Ser: 0.81 mg/dL (ref 0.44–1.00)
GFR, Estimated: >60 mL/min (ref >60)
Glucose, Bld: 70 mg/dL (ref 70–99)
Potassium: 3.6 mmol/L (ref 3.5–5.1)
Sodium: 140 mmol/L (ref 135–145)

## 2023-10-18 LAB — PHOSPHORUS: Phosphorus: 3.9 mg/dL (ref 2.5–4.6)

## 2023-10-18 LAB — MAGNESIUM: Magnesium: 2.1 mg/dL (ref 1.7–2.4)

## 2023-10-18 NOTE — Evaluation (Signed)
Physical Therapy Evaluation and Discharge  Patient Details Name: Tiffany [name] MRN: [id number] DOB: [birth year] Today's Date: 10/18/2023  History of Present Illness  Patient is a 82 year old female presenting after syncopal episode. History of SVT, HTN, osteoporosis.  Clinical Impression  Patient is agreeable to PT evaluation. She is independent at baseline, ambulating without device, and reports managing her own medications at home. She lives with her daughter.  Today, the patient appears to be at her baseline level of functional independence. She is independent with bed mobility and transfers. Ambulation in hallway without loss of balance. No dizziness reported with mobility. No PT needs identified at this time. PT will sign off.   Orthostatic vitals:  BP- Lying Pulse- Lying BP- Sitting Pulse- Sitting BP- Standing at 0 minutes Pulse- Standing at 0 minutes BP- Standing at 3 minutes Pulse- Standing at 3 minutes  10/18/23 0846 146/70 80 154/90 83 146/85 85 149/84 84         If plan is discharge home, recommend the following: Assist for transportation     Equipment Recommendations None recommended by PT     Functional Status Assessment Patient has not had a recent decline in their functional status     Precautions / Restrictions Precautions Precautions:  (low fall risk) Restrictions Weight Bearing Restrictions Per Provider Order: No      Mobility  Bed Mobility Overal bed mobility: Independent                  Transfers Overall transfer level: Independent                 General transfer comment: patient able to don hospital socks in sitting prior to standing without difficulty and no loss of balance    Ambulation/Gait Ambulation/Gait assistance: Modified independent (Device/Increase time) (increased time) Gait Distance (Feet): 100 Feet Assistive device: IV Pole, None Gait Pattern/deviations: Step-through pattern Gait velocity: mildly decreased      General Gait Details: patient ambulated in the room and hallway without difficulty. no dizziness reported with mobility.  Stairs            Wheelchair Mobility     Tilt Bed    Modified Rankin (Stroke Patients Only)       Balance Overall balance assessment: No apparent balance deficits (not formally assessed)                                           Pertinent Vitals/Pain Pain Assessment Pain Assessment: No/denies pain    Home Living Family/patient expects to be discharged to:: Private residence Living Arrangements: Children Available Help at Discharge: Family Type of Home: House Home Access: Stairs to enter   [employer] of Steps: 2            Prior Function Prior Level of Function : Independent/Modified Independent             Mobility Comments: independent without device ADLs Comments: independent. manages her own medications per her report     Extremity/Trunk Assessment   Upper Extremity Assessment Upper Extremity Assessment: Overall WFL for tasks assessed    Lower Extremity Assessment Lower Extremity Assessment: Overall WFL for tasks assessed       Communication   Communication Communication: No apparent difficulties  Cognition Arousal: Alert Behavior During Therapy: WFL for tasks assessed/performed Overall Cognitive Status: Within Functional Limits for tasks assessed  General Comments General comments (skin integrity, edema, etc.): orthostatic vitals taken during session. no dizziness is reported with any mobility. education on monitoring for signs of dizziness with mobility at home for fall prevention.    Exercises     Assessment/Plan    PT Assessment Patient does not need any further PT services  PT Problem List         PT Treatment Interventions      PT Goals (Current goals can be found in the Care Plan section)  Acute Rehab PT  Goals PT Goal Formulation: All assessment and education complete, DC therapy    Frequency       Co-evaluation               AM-PAC PT "6 Clicks" Mobility  Outcome Measure Help needed turning from your back to your side while in a flat bed without using bedrails?: None Help needed moving from lying on your back to sitting on the side of a flat bed without using bedrails?: None Help needed moving to and from a bed to a chair (including a wheelchair)?: None Help needed standing up from a chair using your arms (e.g., wheelchair or bedside chair)?: None Help needed to walk in hospital room?: None Help needed climbing 3-5 steps with a railing? : None 6 Click Score: 24    End of Session   Activity Tolerance: Patient tolerated treatment well Patient left: in bed;with call bell/phone within reach Nurse Communication: Mobility status      Time: 6578-4696 PT Time Calculation (min) (ACUTE ONLY): 15 min   Charges:   PT Evaluation $PT Eval Low Complexity: 1 Low   PT General Charges $$ ACUTE PT VISIT: 1 Visit        Donna Bernard, PT, MPT  [employer] 10/18/2023, 9:05 AM

## 2023-10-18 NOTE — ED Notes (Signed)
ED TO INPATIENT HANDOFF REPORT  ED Nurse Name and Phone #: =  S Name/Age/Gender Tiffany [name] 82 y.o. female Room/Bed: 002C/002C  Code Status   Code Status: Full Code  Home/SNF/Other Home Patient oriented to: self, place, time, and situation Is this baseline? Yes   Triage Complete: Triage complete  Chief Complaint Syncope [R55]  Triage Note Pt BIB [facility] from home. EMS reports pt was standing and began to get dizzy and went to sit down and became unresponsive and began staring off and drooling. Pt also urinated on herself. Upon EMS arrival 84/49 BP. Pt does not remember the incident. Pt is A&Ox4. GCS 15.   EMS  163 CBG 70 P 131/70 BP 99% RA 20 LAC   Allergies Allergies  Allergen Reactions   Elemental Sulfur Hives   Shellfish Allergy Anaphylaxis   Sulfa Antibiotics Hives    Level of Care/Admitting Diagnosis ED Disposition     ED Disposition  Admit   Condition  --   Comment  Hospital Area: [facility] [[id number]]  Level of Care: Telemetry Medical [104]  [facility] place patient in observation at [facility] or [facility] if equivalent level of care is available:: Yes  Covid Evaluation: Asymptomatic - no recent exposure (last 10 days) testing not required  Diagnosis: Syncope [206001]  Admitting Physician: Darlin Drop [[id number]]  Attending Physician: Darlin Drop [[id number]]          B Medical/Surgery History Past Medical History:  Diagnosis Date   Hypertension    No past surgical history on file.   A IV Location/Drains/Wounds Patient Lines/Drains/Airways Status     Active Line/Drains/Airways     Name Placement date Placement time Site Days   Peripheral IV 10/17/23 20 G 1" Left Antecubital 10/17/23  --  Antecubital  1            Intake/Output Last 24 hours No intake or output data in the 24 hours ending 10/18/23 0707  Labs/Imaging Results for orders placed or performed during the hospital encounter of 10/17/23 (from the  past 48 hours)  Urinalysis, w/ Reflex to Culture (Infection Suspected) -Urine, Clean Catch     Status: Abnormal   Collection Time: 10/17/23  8:25 PM  Result Value Ref Range   Specimen Source URINE, CLEAN CATCH    Color, Urine YELLOW YELLOW   APPearance CLEAR CLEAR   Specific Gravity, Urine 1.016 1.005 - 1.030   pH 6.0 5.0 - 8.0   Glucose, UA NEGATIVE NEGATIVE mg/dL   Hgb urine dipstick NEGATIVE NEGATIVE   Bilirubin Urine NEGATIVE NEGATIVE   Ketones, ur 20 (A) NEGATIVE mg/dL   Protein, ur NEGATIVE NEGATIVE mg/dL   Nitrite NEGATIVE NEGATIVE   Leukocytes,Ua NEGATIVE NEGATIVE   RBC / HPF 0-5 0 - 5 RBC/hpf   WBC, UA 0-5 0 - 5 WBC/hpf    Comment:        Reflex urine culture not performed if WBC <=10, OR if Squamous epithelial cells >5. If Squamous epithelial cells >5 suggest recollection.    Bacteria, UA NONE SEEN NONE SEEN   Squamous Epithelial / HPF 0-5 0 - 5 /HPF   Mucus PRESENT    Hyaline Casts, UA PRESENT     Comment: Performed at [facility], [address], [place], [place] [address]  Magnesium     Status: None   Collection Time: 10/17/23  8:41 PM  Result Value Ref Range   Magnesium 2.1 1.7 - 2.4 mg/dL  Comment: Performed at [facility], [address], [place], [place] [address]  Comprehensive metabolic panel     Status: Abnormal   Collection Time: 10/17/23  8:41 PM  Result Value Ref Range   Sodium 139 135 - 145 mmol/L   Potassium 4.0 3.5 - 5.1 mmol/L   Chloride 107 98 - 111 mmol/L   CO2 21 (L) 22 - 32 mmol/L   Glucose, Bld 104 (H) 70 - 99 mg/dL    Comment: Glucose reference range applies only to samples taken after fasting for at least 8 hours.   BUN 18 8 - 23 mg/dL   Creatinine, Ser 1.91 0.44 - 1.00 mg/dL   Calcium 9.1 8.9 - 47.8 mg/dL   Total Protein 7.5 6.5 - 8.1 g/dL   Albumin 3.2 (L) 3.5 - 5.0 g/dL   AST 21 15 - 41 U/L   ALT 14 0 - 44 U/L   Alkaline Phosphatase 77 38 - 126 U/L   Total Bilirubin 0.6 <1.2 mg/dL   GFR, Estimated 59 (L) >60  mL/min    Comment: (NOTE) Calculated using the CKD-EPI Creatinine Equation (2021)    Anion gap 11 5 - 15    Comment: Performed at [facility], [address], [place], [place] [address]  CBC with Differential     Status: Abnormal   Collection Time: 10/17/23  8:41 PM  Result Value Ref Range   WBC 12.3 (H) 4.0 - 10.5 K/uL   RBC 3.27 (L) 3.87 - 5.11 MIL/uL   Hemoglobin 9.0 (L) 12.0 - 15.0 g/dL   HCT 13.0 (L) 86.5 - 78.4 %   MCV 87.8 80.0 - 100.0 fL   MCH 27.5 26.0 - 34.0 pg   MCHC 31.4 30.0 - 36.0 g/dL   RDW 69.6 29.5 - 28.4 %   Platelets 354 150 - 400 K/uL   nRBC 0.0 0.0 - 0.2 %   Neutrophils Relative % 73 %   Neutro Abs 9.1 (H) 1.7 - 7.7 K/uL   Lymphocytes Relative 16 %   Lymphs Abs 1.9 0.7 - 4.0 K/uL   Monocytes Relative 9 %   Monocytes Absolute 1.1 (H) 0.1 - 1.0 K/uL   Eosinophils Relative 1 %   Eosinophils Absolute 0.1 0.0 - 0.5 K/uL   Basophils Relative 0 %   Basophils Absolute 0.0 0.0 - 0.1 K/uL   Immature Granulocytes 1 %   Abs Immature Granulocytes 0.07 0.00 - 0.07 K/uL    Comment: Performed at [facility], [address], [place], [place] [address]  Troponin I (High Sensitivity)     Status: None   Collection Time: 10/17/23  8:41 PM  Result Value Ref Range   Troponin I (High Sensitivity) 3 <18 ng/L    Comment: (NOTE) Elevated high sensitivity troponin I (hsTnI) values and significant  changes across serial measurements may suggest ACS but many other  chronic and acute conditions are known to elevate hsTnI results.  Refer to the "Links" section for chest pain algorithms and additional  guidance. Performed at [facility], [address], [place], [place] [address]   Troponin I (High Sensitivity)     Status: None   Collection Time: 10/17/23 10:53 PM  Result Value Ref Range   Troponin I (High Sensitivity) 3 <18 ng/L    Comment: (NOTE) Elevated high sensitivity troponin I (hsTnI) values and significant  changes across serial measurements may  suggest ACS but many other  chronic and acute conditions are  known to elevate hsTnI results.  Refer to the "Links" section for chest pain algorithms and additional  guidance. Performed at [facility], [address], [place], [place] [address]    DG Chest Portable 1 View Result Date: 10/17/2023 CLINICAL DATA:  Loss of consciousness and dizziness EXAM: PORTABLE CHEST 1 VIEW COMPARISON:  06/20/2021 FINDINGS: Stable cardiomediastinal silhouette. Aortic atherosclerotic calcification. No focal consolidation, pleural effusion, or pneumothorax. No displaced rib fractures. IMPRESSION: No acute cardiopulmonary disease. Electronically Signed   By: Minerva Fester M.D.   On: 10/17/2023 20:33    Pending Labs Unresulted Labs (From admission, onward)     Start     Ordered   10/24/23 0500  Creatinine, serum  (enoxaparin (LOVENOX)    CrCl >/= 30 ml/min)  Weekly,   R     Comments: while on enoxaparin therapy    10/17/23 2251   10/18/23 0647  CBC  Once,   R        10/18/23 0646   10/18/23 0647  Basic metabolic panel  Once,   R        10/18/23 0646   10/18/23 0647  Magnesium  Once,   R        10/18/23 0646   10/18/23 0647  Phosphorus  Once,   R        10/18/23 0646            Vitals/Pain Today's Vitals   10/18/23 0200 10/18/23 0400 10/18/23 0600 10/18/23 0614  BP: 133/64 134/80 125/65   Pulse: 82 79 80   Resp: 14 16 18    Temp:    98.6 F (37 C)  TempSrc:      SpO2: 100% 99% 100%   Weight:      Height:      PainSc:        Isolation Precautions No active isolations  Medications Medications  enoxaparin (LOVENOX) injection 40 mg (has no administration in time range)  prochlorperazine (COMPAZINE) injection 5 mg (has no administration in time range)  acetaminophen (TYLENOL) tablet 650 mg (has no administration in time range)  polyethylene glycol (MIRALAX / GLYCOLAX) packet 17 g (has no administration in time range)  melatonin tablet 5 mg (has no administration in time range)   0.9 %  sodium chloride infusion ( Intravenous New Bag/Given 10/17/23 2358)    Mobility walks     Focused Assessments Neuro Assessment Handoff:  Swallow screen pass?  Cardiac Rhythm: Normal sinus rhythm       Neuro Assessment: Exceptions to WDL Neuro Checks:      Has TPA been given?  If patient is a Neuro Trauma and patient is going to OR before floor call report to 4N Charge nurse: [phone] or [phone]   R Recommendations: See Admitting Provider Note  Report given to:   Additional Notes:

## 2023-10-18 NOTE — Progress Notes (Signed)
Patient transferred from ED via bed. Alert and oriented. On RA. Connected to cardiac monitor box 06. Skin is intact. Will continue to monitor

## 2023-10-18 NOTE — Discharge Summary (Signed)
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  She had transient loss of consciousness without trauma.  Patient had gone to communion at her church in the morning.  EMS found her with blood pressure 84/49.  Alert awake and oriented.  Brought to the ER with normal findings.  She had clinical evidence of dehydration.  Witnessed syncopal episode, likely secondary to hypovolemia. Orthostatic symptoms negative after fluid resuscitation.  2D echocardiogram essentially normal.  No evidence of arrhythmias on telemetry monitor last 20 hours.  Currently asymptomatic.  Isolated episodes. Encourage adequate nutrition, adequate oral intake and fluid intake.  Orthostatic precautions.  Resume metoprolol. Renal functions are adequate. Stable for discharge.   Discharge Diagnoses:  Principal Problem:   Syncope    Discharge Instructions  Discharge Instructions     Diet - low sodium heart healthy   Complete by: As directed    Discharge instructions   Complete by: As directed    Hydrate yourself well.   Increase activity slowly   Complete by: As directed       Allergies as of 10/18/2023       Reactions   Elemental Sulfur Hives   Shellfish Allergy Anaphylaxis   Sulfa Antibiotics Hives        Medication List     TAKE these medications    alendronate 70 MG tablet Commonly known as: FOSAMAX Take 70 mg by mouth once a week.    bimatoprost 0.03 % ophthalmic solution Commonly known as: LUMIGAN Place 1 drop into both eyes at bedtime.   D3-50 1.25 MG (50000 UT) capsule Generic drug: Cholecalciferol Take 50,000 Units by mouth once a week. On mondays   dorzolamide 2 % ophthalmic solution Commonly known as: TRUSOPT Place 1 drop into both eyes 2 (two) times daily.   metoprolol tartrate 50 MG tablet Commonly known as: LOPRESSOR Take 50 mg by mouth daily.        Allergies  Allergen Reactions   Elemental Sulfur Hives   Shellfish Allergy Anaphylaxis   Sulfa Antibiotics Hives    Consultations: None   Procedures/Studies: DG Chest Portable 1 View Result Date: 10/17/2023 CLINICAL DATA:  Loss of consciousness and dizziness EXAM: PORTABLE CHEST 1 VIEW COMPARISON:  06/20/2021 FINDINGS: Stable cardiomediastinal silhouette. Aortic atherosclerotic calcification. No focal consolidation, pleural effusion, or pneumothorax. No displaced rib fractures. IMPRESSION: No acute cardiopulmonary disease. Electronically Signed   By: Minerva Fester M.D.   On: 10/17/2023 20:33   (Echo, Carotid, EGD, Colonoscopy, ERCP)    Subjective: Patient seen in the morning rounds.  Denies any complaints.  Patient tells me that she is not feeling any dizzy or lightheaded when walking around.  Denies any chest pain or palpitations.   Discharge Exam: Vitals:   10/18/23 0800 10/18/23 1225  BP: (!) 152/76 136/83  Pulse: 91 77  Resp: 16   Temp:  98 F (36.7 C)  SpO2: 100% 100%   Vitals:   10/18/23 0614 10/18/23 0759 10/18/23 0800 10/18/23 1225  BP:   (!) 152/76 136/83  Pulse:   91 77  Resp:   16   Temp: 98.6 F (37 C)   98 F (36.7 C)  TempSrc:    Oral  SpO2:   100% 100%  Weight:  55.7 kg    Height:  5' (1.524 m)      General: Pt is alert, awake, not in acute distress Cardiovascular: RRR, S1/S2 +, no rubs, no gallops Respiratory: CTA bilaterally, no wheezing, no rhonchi Abdominal: Soft, NT, ND, bowel sounds + Extremities:  no edema, no cyanosis    The results of significant diagnostics from this hospitalization (including imaging, microbiology, ancillary and laboratory) are listed below for reference.     Microbiology: No results found for this or any previous visit (from the past 240 hours).   Labs: BNP (last 3 results) No results for input(s): "BNP" in the last 8760 hours. Basic Metabolic Panel: Recent Labs  Lab 10/17/23 2041 10/18/23 0806  NA 139 140  K 4.0 3.6  CL 107 109  CO2 21* 21*  GLUCOSE 104* 70  BUN 18 15  CREATININE 0.97 0.81  CALCIUM 9.1 9.1  MG 2.1 2.1  PHOS  --  3.9   Liver Function Tests: Recent Labs  Lab 10/17/23 2041  AST 21  ALT 14  ALKPHOS 77  BILITOT 0.6  PROT 7.5  ALBUMIN 3.2*   No results for input(s): "LIPASE", "AMYLASE" in the last 168 hours. No results for input(s): "AMMONIA" in the last 168 hours. CBC: Recent Labs  Lab 10/17/23 2041 10/18/23 0806  WBC 12.3* 10.9*  NEUTROABS 9.1*  --   HGB 9.0* 8.4*  HCT 28.7* 25.5*  MCV 87.8 86.4  PLT 354 304   Cardiac Enzymes: No results for input(s): "CKTOTAL", "CKMB", "CKMBINDEX", "TROPONINI" in the last 168 hours. BNP: Invalid input(s): "POCBNP" CBG: No results for input(s): "GLUCAP" in the last 168 hours. D-Dimer No results for input(s): "DDIMER" in the last 72 hours. Hgb A1c No results for input(s): "HGBA1C" in the last 72 hours. Lipid Profile No results for input(s): "CHOL", "HDL", "LDLCALC", "TRIG", "CHOLHDL", "LDLDIRECT" in the last 72 hours. Thyroid function studies No results for input(s): "TSH", "T4TOTAL", "T3FREE", "THYROIDAB" in the last 72 hours.  Invalid input(s): "FREET3" Anemia work up No results for input(s): "VITAMINB12", "FOLATE", "FERRITIN", "TIBC", "IRON", "RETICCTPCT" in the last 72 hours. Urinalysis    Component Value Date/Time   COLORURINE YELLOW 10/17/2023 2025   APPEARANCEUR CLEAR 10/17/2023 2025   LABSPEC 1.016 10/17/2023 2025   PHURINE 6.0 10/17/2023 2025   GLUCOSEU  NEGATIVE 10/17/2023 2025   HGBUR NEGATIVE 10/17/2023 2025   BILIRUBINUR NEGATIVE 10/17/2023 2025   KETONESUR 20 (A) 10/17/2023 2025   PROTEINUR NEGATIVE 10/17/2023 2025   NITRITE NEGATIVE 10/17/2023 2025   LEUKOCYTESUR NEGATIVE 10/17/2023 2025   Sepsis Labs Recent Labs  Lab 10/17/23 2041 10/18/23 0806  WBC 12.3* 10.9*   Microbiology No results found for this or any previous visit (from the past 240 hours).   Time coordinating discharge: 32 minutes  SIGNED:   Dorcas Carrow, MD  [facility] 10/18/2023, 3:26 PM

## 2023-10-18 NOTE — Progress Notes (Signed)
OT Cancellation Note  Patient Details Name: Sherma Mangold MRN: [id number] DOB: [birth year]   Cancelled Treatment:    Reason Eval/Treat Not Completed: OT screened, no needs identified, will sign off.  Pt independent with PT earlier session and at baseline for completion of functional tasks.  No further acute OT needs at this time.  Berneta Sconyers OTR/L 10/18/2023, 11:42 AM

## 2023-10-18 NOTE — Care Management Obs Status (Signed)
MEDICARE OBSERVATION STATUS NOTIFICATION   Patient Details  Name: Tiffany [name] MRN: [id number] Date of Birth: 1940-11-26   Medicare Observation Status Notification Given:  Yes    Ronny Bacon, RN 10/18/2023, 9:42 AM

## 2023-10-18 NOTE — ED Provider Notes (Signed)
[place] EMERGENCY DEPARTMENT AT [facility] Provider Note   CSN: [id number] Arrival date & time: 10/17/23  1940     History  Chief Complaint  Patient presents with   Loss of Consciousness   Dizziness    Tiffany [name] is a 82 y.o. female.  82 year old female with past medical history of SVT, HTN, osteoporosis presents here after reported syncopal episode.  Patient states that she was in her kitchen finishing cooking when she started to feel dizzy.  She went and sat down on the couch.  This improved her symptoms.  She got back up and started to wash the dishes.  The dizziness returned.  She then sat back down on the couch again.  Shortly after that, the patient lost consciousness.  This was witnessed by her son-in-law, who states that her eyes rolled in the back of her head.  Patient recalls waking up on the floor after this episode.  She denies any associated changes in vision, headache, chest pain, shortness of breath, nausea, vomiting, diaphoresis.  Initially, she reports that there were some palpitations that she noticed with her dizziness.  However, later states that she did not have palpitations.  She has been diagnosed with SVT, which was confirmed on Holter monitor.  She is currently on metoprolol.  She has never had dizziness or lightheadedness associated with her episodes of palpitations.  She has otherwise been in her normal state of health.  No recent illnesses, fevers, chills.  The history is provided by the patient and a relative.       Home Medications Prior to Admission medications   Medication Sig Start Date End Date Taking? Authorizing Provider  alendronate (FOSAMAX) 70 MG tablet Take 70 mg by mouth once a week. 09/09/23  Yes [provider]  bimatoprost (LUMIGAN) 0.03 % ophthalmic solution Place 1 drop into both eyes at bedtime.   Yes [provider]  D3-50 1.25 MG (50000 UT) capsule Take 50,000 Units by mouth once a week. On mondays  09/09/23  Yes [provider]  dorzolamide (TRUSOPT) 2 % ophthalmic solution Place 1 drop into both eyes 2 (two) times daily.   Yes [provider]  metoprolol tartrate (LOPRESSOR) 50 MG tablet Take 50 mg by mouth daily. 09/09/23  Yes [provider]      Allergies    Elemental sulfur, Shellfish allergy, and Sulfa antibiotics    Review of Systems   As noted in HPI  Physical Exam Updated Vital Signs BP 120/74   Pulse 79   Temp 97.8 F (36.6 C) (Oral)   Resp 17   Ht 5\' 2"  (1.575 m)   Wt 59 kg   SpO2 100%   BMI 23.78 kg/m  Physical Exam Vitals reviewed.  Constitutional:      General: She is not in acute distress.    Appearance: Normal appearance. She is normal weight. She is not ill-appearing, toxic-appearing or diaphoretic.  HENT:     Head: Normocephalic and atraumatic.     Nose: Nose normal.     Mouth/Throat:     Mouth: Mucous membranes are moist.  Eyes:     General: No visual field deficit or scleral icterus.    Extraocular Movements: Extraocular movements intact.     Pupils: Pupils are equal, round, and reactive to light.  Cardiovascular:     Rate and Rhythm: Normal rate and regular rhythm.     Pulses: Normal pulses.  Radial pulses are 2+ on the right side and 2+ on the left side.       Dorsalis pedis pulses are 2+ on the right side and 2+ on the left side.     Heart sounds: Normal heart sounds. No murmur heard.    No friction rub. No gallop.  Pulmonary:     Effort: Pulmonary effort is normal. No respiratory distress.     Breath sounds: Normal breath sounds. No wheezing, rhonchi or rales.  Abdominal:     General: There is no distension.     Palpations: Abdomen is soft.     Tenderness: There is no abdominal tenderness. There is no guarding or rebound.  Musculoskeletal:     Right lower leg: No edema.     Left lower leg: No edema.  Skin:    General: Skin is warm and dry.  Neurological:     Mental Status: She is alert.     GCS:  GCS eye subscore is 4. GCS verbal subscore is 5. GCS motor subscore is 6.     Cranial Nerves: Cranial nerves 2-12 are intact. No cranial nerve deficit, dysarthria or facial asymmetry.     Sensory: Sensation is intact. No sensory deficit.     Motor: Motor function is intact. No weakness, tremor, abnormal muscle tone or pronator drift.     Coordination: Coordination is intact. Romberg sign negative. Finger-Nose-Finger Test and Heel to [place] Test normal.     Gait: Gait is intact. Gait normal.     ED Results / Procedures / Treatments   Labs (all labs ordered are listed, but only abnormal results are displayed) Labs Reviewed  COMPREHENSIVE METABOLIC PANEL - Abnormal; Notable for the following components:      Result Value   CO2 21 (*)    Glucose, Bld 104 (*)    Albumin 3.2 (*)    GFR, Estimated 59 (*)    All other components within normal limits  CBC WITH DIFFERENTIAL/PLATELET - Abnormal; Notable for the following components:   WBC 12.3 (*)    RBC 3.27 (*)    Hemoglobin 9.0 (*)    HCT 28.7 (*)    Neutro Abs 9.1 (*)    Monocytes Absolute 1.1 (*)    All other components within normal limits  URINALYSIS, W/ REFLEX TO CULTURE (INFECTION SUSPECTED) - Abnormal; Notable for the following components:   Ketones, ur 20 (*)    All other components within normal limits  MAGNESIUM  TROPONIN I (HIGH SENSITIVITY)  TROPONIN I (HIGH SENSITIVITY)    EKG EKG Interpretation Date/Time:  Sunday October 17 2023 19:47:48 EST Ventricular Rate:  80 PR Interval:  170 QRS Duration:  135 QT Interval:  371 QTC Calculation: 423 R Axis:   45  Text Interpretation: Sinus rhythm Probable left atrial enlargement IVCD, consider atypical RBBB Baseline wander in lead(s) II III aVL aVF V1 Confirmed by Estanislado Pandy [phone]) on 10/17/2023 10:29:57 PM  Radiology DG Chest Portable 1 View Result Date: 10/17/2023 CLINICAL DATA:  Loss of consciousness and dizziness EXAM: PORTABLE CHEST 1 VIEW COMPARISON:  06/20/2021  FINDINGS: Stable cardiomediastinal silhouette. Aortic atherosclerotic calcification. No focal consolidation, pleural effusion, or pneumothorax. No displaced rib fractures. IMPRESSION: No acute cardiopulmonary disease. Electronically Signed   By: Minerva Fester M.D.   On: 10/17/2023 20:33    Procedures Procedures    Medications Ordered in ED Medications  enoxaparin (LOVENOX) injection 40 mg (has no administration in time range)  prochlorperazine (COMPAZINE) injection 5 mg (has  no administration in time range)  acetaminophen (TYLENOL) tablet 650 mg (has no administration in time range)  polyethylene glycol (MIRALAX / GLYCOLAX) packet 17 g (has no administration in time range)  melatonin tablet 5 mg (has no administration in time range)  0.9 %  sodium chloride infusion ( Intravenous New Bag/Given 10/17/23 2358)    ED Course/ Medical Decision Making/ A&P                                 Medical Decision Making Amount and/or Complexity of Data Reviewed Independent Historian: caregiver Labs: ordered. Radiology: ordered and independent interpretation performed. ECG/medicine tests: ordered and independent interpretation performed.  Risk Decision regarding hospitalization.   82 year old female presents here after a syncopal episode that may or may not have been associated with palpitations.  Vitals reassuring on presentation here.  On exam, patient is well-appearing and in no acute distress.  She has a nonfocal neurologic exam without deficits.  Remainder of her exam is unremarkable.  Differential includes ACS, PE, arrhythmia, electrolyte abnormality, infection, CVA.  Patient has a reassuring neurologic exam here.  Her dizziness has resolved.  I feel that her presentation is less consistent with CVA.  I considered PE.  However, patient has no lower extremity edema.  She is not having chest pain or shortness of breath.  Doubt PE as the etiology of her syncope.  Will plan to obtain screening  labs, chest x-ray, ECG.  I independently interpreted the patient's chest x-ray.  No findings consistent with focal opacities reflective of pneumonia.  No other acute findings noted.  I independently interpreted the patient's ECG, which demonstrates NSR.  Normal intervals.  No axis deviation.  No ST segment changes.  Overall, is a nonischemic ECG.  Patient's laboratory workup reviewed.  CBC notable for leukocytosis with left shift.  Also has normocytic anemia.  No prior labs for comparison.  CMP with slightly low bicarb of 21.  Otherwise, unremarkable.  Magnesium, serial troponins within normal limits.  UA not consistent with UTI.  Workup here reassuring.  However, patient's episode does represent high risk syncope.  I do feel that she would benefit from hospital admission for observation on telemetry and potential additional cardiac workup, such as echo.  Hospitalist consulted for admission.  I spoke with hospitalist, Dr. Margo Aye, who has accepted the patient for admission.  Patient's presentation is most consistent with acute presentation with potential threat to life or bodily function.         Final Clinical Impression(s) / ED Diagnoses Final diagnoses:  Syncope, unspecified syncope type    Rx / DC Orders ED Discharge Orders     None         Rolla Flatten, MD 10/18/23 0036    Coral Spikes, DO 10/20/23 1455

## 2023-10-18 NOTE — Progress Notes (Signed)
*  PRELIMINARY RESULTS* Echocardiogram 2D Echocardiogram has been performed.  Tiffany [name] 10/18/2023, 3:36 PM

## 2023-10-19 DIAGNOSIS — D649 Anemia, unspecified: Secondary | ICD-10-CM | POA: Diagnosis not present

## 2023-10-19 DIAGNOSIS — R2681 Unsteadiness on feet: Secondary | ICD-10-CM | POA: Diagnosis not present

## 2023-10-19 DIAGNOSIS — I10 Essential (primary) hypertension: Secondary | ICD-10-CM | POA: Diagnosis not present

## 2023-10-19 DIAGNOSIS — R7303 Prediabetes: Secondary | ICD-10-CM | POA: Diagnosis not present

## 2023-10-19 DIAGNOSIS — E785 Hyperlipidemia, unspecified: Secondary | ICD-10-CM | POA: Diagnosis not present

## 2023-10-19 DIAGNOSIS — R55 Syncope and collapse: Secondary | ICD-10-CM | POA: Diagnosis not present

## 2023-12-07 DIAGNOSIS — R6510 Systemic inflammatory response syndrome (SIRS) of non-infectious origin without acute organ dysfunction: Secondary | ICD-10-CM | POA: Diagnosis not present

## 2023-12-07 DIAGNOSIS — R531 Weakness: Secondary | ICD-10-CM | POA: Diagnosis not present

## 2024-01-04 DIAGNOSIS — H35363 Drusen (degenerative) of macula, bilateral: Secondary | ICD-10-CM | POA: Diagnosis not present

## 2024-01-04 DIAGNOSIS — H401134 Primary open-angle glaucoma, bilateral, indeterminate stage: Secondary | ICD-10-CM | POA: Diagnosis not present

## 2024-01-04 DIAGNOSIS — H35033 Hypertensive retinopathy, bilateral: Secondary | ICD-10-CM | POA: Diagnosis not present

## 2024-01-04 DIAGNOSIS — H43811 Vitreous degeneration, right eye: Secondary | ICD-10-CM | POA: Diagnosis not present

## 2024-01-18 DIAGNOSIS — E785 Hyperlipidemia, unspecified: Secondary | ICD-10-CM | POA: Diagnosis not present

## 2024-01-18 DIAGNOSIS — G479 Sleep disorder, unspecified: Secondary | ICD-10-CM | POA: Diagnosis not present

## 2024-01-18 DIAGNOSIS — Z6824 Body mass index (BMI) 24.0-24.9, adult: Secondary | ICD-10-CM | POA: Diagnosis not present

## 2024-01-18 DIAGNOSIS — R6510 Systemic inflammatory response syndrome (SIRS) of non-infectious origin without acute organ dysfunction: Secondary | ICD-10-CM | POA: Diagnosis not present

## 2024-01-18 DIAGNOSIS — I10 Essential (primary) hypertension: Secondary | ICD-10-CM | POA: Diagnosis not present

## 2024-01-18 DIAGNOSIS — R634 Abnormal weight loss: Secondary | ICD-10-CM | POA: Diagnosis not present

## 2024-01-18 DIAGNOSIS — R7303 Prediabetes: Secondary | ICD-10-CM | POA: Diagnosis not present

## 2024-01-26 DIAGNOSIS — R6510 Systemic inflammatory response syndrome (SIRS) of non-infectious origin without acute organ dysfunction: Secondary | ICD-10-CM | POA: Diagnosis not present

## 2024-01-26 DIAGNOSIS — E785 Hyperlipidemia, unspecified: Secondary | ICD-10-CM | POA: Diagnosis not present

## 2024-01-26 DIAGNOSIS — D649 Anemia, unspecified: Secondary | ICD-10-CM | POA: Diagnosis not present

## 2024-01-26 DIAGNOSIS — R6883 Chills (without fever): Secondary | ICD-10-CM | POA: Diagnosis not present

## 2024-02-02 DIAGNOSIS — R6510 Systemic inflammatory response syndrome (SIRS) of non-infectious origin without acute organ dysfunction: Secondary | ICD-10-CM | POA: Diagnosis not present

## 2024-02-02 DIAGNOSIS — E785 Hyperlipidemia, unspecified: Secondary | ICD-10-CM | POA: Diagnosis not present

## 2024-02-02 DIAGNOSIS — D582 Other hemoglobinopathies: Secondary | ICD-10-CM | POA: Diagnosis not present

## 2024-02-02 DIAGNOSIS — I10 Essential (primary) hypertension: Secondary | ICD-10-CM | POA: Diagnosis not present

## 2024-04-11 DIAGNOSIS — Z1231 Encounter for screening mammogram for malignant neoplasm of breast: Secondary | ICD-10-CM | POA: Diagnosis not present

## 2024-04-25 DIAGNOSIS — Z6825 Body mass index (BMI) 25.0-25.9, adult: Secondary | ICD-10-CM | POA: Diagnosis not present

## 2024-04-25 DIAGNOSIS — R6883 Chills (without fever): Secondary | ICD-10-CM | POA: Diagnosis not present

## 2024-04-25 DIAGNOSIS — R6510 Systemic inflammatory response syndrome (SIRS) of non-infectious origin without acute organ dysfunction: Secondary | ICD-10-CM | POA: Diagnosis not present

## 2024-04-25 DIAGNOSIS — D640 Hereditary sideroblastic anemia: Secondary | ICD-10-CM | POA: Diagnosis not present

## 2024-04-25 DIAGNOSIS — I10 Essential (primary) hypertension: Secondary | ICD-10-CM | POA: Diagnosis not present

## 2024-04-26 ENCOUNTER — Other Ambulatory Visit: Payer: Self-pay | Admitting: Ophthalmology

## 2024-04-26 DIAGNOSIS — H401134 Primary open-angle glaucoma, bilateral, indeterminate stage: Secondary | ICD-10-CM | POA: Diagnosis not present

## 2024-04-26 DIAGNOSIS — H35033 Hypertensive retinopathy, bilateral: Secondary | ICD-10-CM | POA: Diagnosis not present

## 2024-04-26 DIAGNOSIS — H538 Other visual disturbances: Secondary | ICD-10-CM

## 2024-04-26 DIAGNOSIS — H43811 Vitreous degeneration, right eye: Secondary | ICD-10-CM | POA: Diagnosis not present

## 2024-04-26 DIAGNOSIS — H35363 Drusen (degenerative) of macula, bilateral: Secondary | ICD-10-CM | POA: Diagnosis not present

## 2024-04-26 DIAGNOSIS — R519 Headache, unspecified: Secondary | ICD-10-CM

## 2024-04-27 ENCOUNTER — Encounter: Payer: Self-pay | Admitting: Ophthalmology

## 2024-04-28 ENCOUNTER — Ambulatory Visit
Admission: RE | Admit: 2024-04-28 | Discharge: 2024-04-28 | Disposition: A | Discharge status: Home / Self Care | Source: Ambulatory Visit | Attending: Ophthalmology | Admitting: Ophthalmology

## 2024-04-28 DIAGNOSIS — H538 Other visual disturbances: Secondary | ICD-10-CM

## 2024-04-28 DIAGNOSIS — R519 Headache, unspecified: Secondary | ICD-10-CM

## 2024-04-28 DIAGNOSIS — I6782 Cerebral ischemia: Secondary | ICD-10-CM | POA: Diagnosis not present

## 2024-05-25 DIAGNOSIS — H35033 Hypertensive retinopathy, bilateral: Secondary | ICD-10-CM | POA: Diagnosis not present

## 2024-05-25 DIAGNOSIS — H401134 Primary open-angle glaucoma, bilateral, indeterminate stage: Secondary | ICD-10-CM | POA: Diagnosis not present

## 2024-05-25 DIAGNOSIS — H35363 Drusen (degenerative) of macula, bilateral: Secondary | ICD-10-CM | POA: Diagnosis not present

## 2024-05-25 DIAGNOSIS — H43811 Vitreous degeneration, right eye: Secondary | ICD-10-CM | POA: Diagnosis not present

## 2024-06-27 DIAGNOSIS — Z6824 Body mass index (BMI) 24.0-24.9, adult: Secondary | ICD-10-CM | POA: Diagnosis not present

## 2024-06-27 DIAGNOSIS — R6510 Systemic inflammatory response syndrome (SIRS) of non-infectious origin without acute organ dysfunction: Secondary | ICD-10-CM | POA: Diagnosis not present

## 2024-06-27 DIAGNOSIS — D582 Other hemoglobinopathies: Secondary | ICD-10-CM | POA: Diagnosis not present

## 2024-06-27 DIAGNOSIS — K219 Gastro-esophageal reflux disease without esophagitis: Secondary | ICD-10-CM | POA: Diagnosis not present

## 2024-06-27 DIAGNOSIS — E785 Hyperlipidemia, unspecified: Secondary | ICD-10-CM | POA: Diagnosis not present

## 2024-06-27 DIAGNOSIS — R7303 Prediabetes: Secondary | ICD-10-CM | POA: Diagnosis not present

## 2024-07-11 ENCOUNTER — Other Ambulatory Visit (HOSPITAL_BASED_OUTPATIENT_CLINIC_OR_DEPARTMENT_OTHER): Payer: Self-pay | Admitting: Family Medicine

## 2024-07-11 ENCOUNTER — Ambulatory Visit (HOSPITAL_BASED_OUTPATIENT_CLINIC_OR_DEPARTMENT_OTHER)
Admission: RE | Admit: 2024-07-11 | Discharge: 2024-07-11 | Disposition: A | Discharge status: Home / Self Care | Source: Ambulatory Visit | Attending: Family Medicine | Admitting: Family Medicine

## 2024-07-11 ENCOUNTER — Other Ambulatory Visit (HOSPITAL_BASED_OUTPATIENT_CLINIC_OR_DEPARTMENT_OTHER): Payer: Self-pay

## 2024-07-11 DIAGNOSIS — M7989 Other specified soft tissue disorders: Secondary | ICD-10-CM | POA: Insufficient documentation

## 2024-07-11 DIAGNOSIS — M79604 Pain in right leg: Secondary | ICD-10-CM | POA: Diagnosis not present

## 2024-07-11 DIAGNOSIS — I82811 Embolism and thrombosis of superficial veins of right lower extremities: Secondary | ICD-10-CM | POA: Diagnosis not present

## 2024-07-11 DIAGNOSIS — I82431 Acute embolism and thrombosis of right popliteal vein: Secondary | ICD-10-CM | POA: Diagnosis not present

## 2024-07-11 MED ORDER — APIXABAN (ELIQUIS) VTE STARTER PACK (10MG AND 5MG)
ORAL_TABLET | ORAL | 0 refills | Status: DC
  Filled 2024-07-11: qty 74, 30d supply, fill #0

## 2024-07-18 DIAGNOSIS — R6510 Systemic inflammatory response syndrome (SIRS) of non-infectious origin without acute organ dysfunction: Secondary | ICD-10-CM | POA: Diagnosis not present

## 2024-07-18 DIAGNOSIS — E785 Hyperlipidemia, unspecified: Secondary | ICD-10-CM | POA: Diagnosis not present

## 2024-07-18 DIAGNOSIS — R040 Epistaxis: Secondary | ICD-10-CM | POA: Diagnosis not present

## 2024-07-18 DIAGNOSIS — I82419 Acute embolism and thrombosis of unspecified femoral vein: Secondary | ICD-10-CM | POA: Diagnosis not present

## 2024-07-18 DIAGNOSIS — Z6824 Body mass index (BMI) 24.0-24.9, adult: Secondary | ICD-10-CM | POA: Diagnosis not present

## 2024-07-18 DIAGNOSIS — I82401 Acute embolism and thrombosis of unspecified deep veins of right lower extremity: Secondary | ICD-10-CM | POA: Diagnosis not present

## 2024-07-22 ENCOUNTER — Other Ambulatory Visit: Payer: Self-pay

## 2024-07-22 ENCOUNTER — Encounter (HOSPITAL_COMMUNITY): Payer: Self-pay | Admitting: *Deleted

## 2024-07-22 ENCOUNTER — Emergency Department (HOSPITAL_COMMUNITY)

## 2024-07-22 ENCOUNTER — Inpatient Hospital Stay (HOSPITAL_COMMUNITY)
Admission: EM | Admit: 2024-07-22 | Discharge: 2024-07-31 | DRG: 064 | Disposition: A | Discharge status: Rehabilitation Facility | Attending: Family Medicine | Admitting: Family Medicine

## 2024-07-22 DIAGNOSIS — R4701 Aphasia: Secondary | ICD-10-CM | POA: Diagnosis present

## 2024-07-22 DIAGNOSIS — D649 Anemia, unspecified: Principal | ICD-10-CM

## 2024-07-22 DIAGNOSIS — I639 Cerebral infarction, unspecified: Secondary | ICD-10-CM | POA: Diagnosis present

## 2024-07-22 DIAGNOSIS — Z86718 Personal history of other venous thrombosis and embolism: Secondary | ICD-10-CM

## 2024-07-22 DIAGNOSIS — E785 Hyperlipidemia, unspecified: Secondary | ICD-10-CM | POA: Diagnosis present

## 2024-07-22 DIAGNOSIS — Z7901 Long term (current) use of anticoagulants: Secondary | ICD-10-CM | POA: Diagnosis not present

## 2024-07-22 DIAGNOSIS — C786 Secondary malignant neoplasm of retroperitoneum and peritoneum: Secondary | ICD-10-CM | POA: Diagnosis not present

## 2024-07-22 DIAGNOSIS — R29704 NIHSS score 4: Secondary | ICD-10-CM | POA: Diagnosis present

## 2024-07-22 DIAGNOSIS — I739 Peripheral vascular disease, unspecified: Secondary | ICD-10-CM | POA: Diagnosis not present

## 2024-07-22 DIAGNOSIS — K3189 Other diseases of stomach and duodenum: Secondary | ICD-10-CM | POA: Diagnosis not present

## 2024-07-22 DIAGNOSIS — D376 Neoplasm of uncertain behavior of liver, gallbladder and bile ducts: Secondary | ICD-10-CM | POA: Diagnosis not present

## 2024-07-22 DIAGNOSIS — I63512 Cerebral infarction due to unspecified occlusion or stenosis of left middle cerebral artery: Secondary | ICD-10-CM | POA: Diagnosis not present

## 2024-07-22 DIAGNOSIS — C169 Malignant neoplasm of stomach, unspecified: Secondary | ICD-10-CM | POA: Diagnosis not present

## 2024-07-22 DIAGNOSIS — I2699 Other pulmonary embolism without acute cor pulmonale: Secondary | ICD-10-CM | POA: Diagnosis not present

## 2024-07-22 DIAGNOSIS — R933 Abnormal findings on diagnostic imaging of other parts of digestive tract: Secondary | ICD-10-CM | POA: Diagnosis not present

## 2024-07-22 DIAGNOSIS — R000 Tachycardia, unspecified: Secondary | ICD-10-CM | POA: Diagnosis not present

## 2024-07-22 DIAGNOSIS — I6340 Cerebral infarction due to embolism of unspecified cerebral artery: Secondary | ICD-10-CM | POA: Diagnosis not present

## 2024-07-22 DIAGNOSIS — I2693 Single subsegmental pulmonary embolism without acute cor pulmonale: Secondary | ICD-10-CM | POA: Diagnosis not present

## 2024-07-22 DIAGNOSIS — R9431 Abnormal electrocardiogram [ECG] [EKG]: Secondary | ICD-10-CM | POA: Diagnosis not present

## 2024-07-22 DIAGNOSIS — F09 Unspecified mental disorder due to known physiological condition: Secondary | ICD-10-CM | POA: Diagnosis not present

## 2024-07-22 DIAGNOSIS — R1310 Dysphagia, unspecified: Secondary | ICD-10-CM | POA: Diagnosis present

## 2024-07-22 DIAGNOSIS — I10 Essential (primary) hypertension: Secondary | ICD-10-CM | POA: Diagnosis present

## 2024-07-22 DIAGNOSIS — C787 Secondary malignant neoplasm of liver and intrahepatic bile duct: Secondary | ICD-10-CM | POA: Diagnosis not present

## 2024-07-22 DIAGNOSIS — Z7952 Long term (current) use of systemic steroids: Secondary | ICD-10-CM

## 2024-07-22 DIAGNOSIS — I82431 Acute embolism and thrombosis of right popliteal vein: Secondary | ICD-10-CM | POA: Diagnosis not present

## 2024-07-22 DIAGNOSIS — D75839 Thrombocytosis, unspecified: Secondary | ICD-10-CM | POA: Diagnosis not present

## 2024-07-22 DIAGNOSIS — I63412 Cerebral infarction due to embolism of left middle cerebral artery: Secondary | ICD-10-CM | POA: Diagnosis present

## 2024-07-22 DIAGNOSIS — Z79899 Other long term (current) drug therapy: Secondary | ICD-10-CM

## 2024-07-22 DIAGNOSIS — I63532 Cerebral infarction due to unspecified occlusion or stenosis of left posterior cerebral artery: Secondary | ICD-10-CM | POA: Diagnosis not present

## 2024-07-22 DIAGNOSIS — I69391 Dysphagia following cerebral infarction: Secondary | ICD-10-CM | POA: Diagnosis not present

## 2024-07-22 DIAGNOSIS — D72829 Elevated white blood cell count, unspecified: Secondary | ICD-10-CM | POA: Diagnosis present

## 2024-07-22 DIAGNOSIS — D6859 Other primary thrombophilia: Secondary | ICD-10-CM | POA: Diagnosis not present

## 2024-07-22 DIAGNOSIS — I69398 Other sequelae of cerebral infarction: Secondary | ICD-10-CM | POA: Diagnosis not present

## 2024-07-22 DIAGNOSIS — I6389 Other cerebral infarction: Secondary | ICD-10-CM | POA: Diagnosis not present

## 2024-07-22 DIAGNOSIS — I69320 Aphasia following cerebral infarction: Secondary | ICD-10-CM | POA: Diagnosis not present

## 2024-07-22 DIAGNOSIS — Z91013 Allergy to seafood: Secondary | ICD-10-CM

## 2024-07-22 DIAGNOSIS — R188 Other ascites: Secondary | ICD-10-CM | POA: Diagnosis not present

## 2024-07-22 DIAGNOSIS — R471 Dysarthria and anarthria: Secondary | ICD-10-CM | POA: Diagnosis present

## 2024-07-22 DIAGNOSIS — I82401 Acute embolism and thrombosis of unspecified deep veins of right lower extremity: Secondary | ICD-10-CM | POA: Diagnosis not present

## 2024-07-22 DIAGNOSIS — G936 Cerebral edema: Secondary | ICD-10-CM | POA: Diagnosis present

## 2024-07-22 DIAGNOSIS — I82811 Embolism and thrombosis of superficial veins of right lower extremities: Secondary | ICD-10-CM | POA: Diagnosis not present

## 2024-07-22 HISTORY — DX: Essential (primary) hypertension: I10

## 2024-07-22 LAB — CBC
HCT: 19.4 % — ABNORMAL LOW (ref 36.0–46.0)
Hemoglobin: 5.6 g/dL — CL (ref 12.0–15.0)
MCH: 22 pg — ABNORMAL LOW (ref 26.0–34.0)
MCHC: 28.9 g/dL — ABNORMAL LOW (ref 30.0–36.0)
MCV: 76.1 fL — ABNORMAL LOW (ref 80.0–100.0)
Platelets: 597 K/uL — ABNORMAL HIGH (ref 150–400)
RBC: 2.55 MIL/uL — ABNORMAL LOW (ref 3.87–5.11)
RDW: 16.8 % — ABNORMAL HIGH (ref 11.5–15.5)
WBC: 16 K/uL — ABNORMAL HIGH (ref 4.0–10.5)
nRBC: 0.1 % (ref 0.0–0.2)

## 2024-07-22 LAB — I-STAT CHEM 8, ED
BUN: 14 mg/dL (ref 8–23)
Calcium, Ion: 1.16 mmol/L (ref 1.15–1.40)
Chloride: 106 mmol/L (ref 98–111)
Creatinine, Ser: 0.8 mg/dL (ref 0.44–1.00)
Glucose, Bld: 95 mg/dL (ref 70–99)
HCT: 18 % — ABNORMAL LOW (ref 36.0–46.0)
Hemoglobin: 6.1 g/dL — CL (ref 12.0–15.0)
Potassium: 3.6 mmol/L (ref 3.5–5.1)
Sodium: 138 mmol/L (ref 135–145)
TCO2: 18 mmol/L — ABNORMAL LOW (ref 22–32)

## 2024-07-22 LAB — COMPREHENSIVE METABOLIC PANEL WITH GFR
ALT: 16 U/L (ref 0–44)
AST: 25 U/L (ref 15–41)
Albumin: 2.7 g/dL — ABNORMAL LOW (ref 3.5–5.0)
Alkaline Phosphatase: 149 U/L — ABNORMAL HIGH (ref 38–126)
Anion gap: 15 (ref 5–15)
BUN: 15 mg/dL (ref 8–23)
CO2: 18 mmol/L — ABNORMAL LOW (ref 22–32)
Calcium: 8.7 mg/dL — ABNORMAL LOW (ref 8.9–10.3)
Chloride: 105 mmol/L (ref 98–111)
Creatinine, Ser: 0.83 mg/dL (ref 0.44–1.00)
GFR, Estimated: >60 mL/min (ref >60)
Glucose, Bld: 98 mg/dL (ref 70–99)
Potassium: 3.6 mmol/L (ref 3.5–5.1)
Sodium: 138 mmol/L (ref 135–145)
Total Bilirubin: 0.9 mg/dL (ref 0.0–1.2)
Total Protein: 7.8 g/dL (ref 6.5–8.1)

## 2024-07-22 LAB — URINALYSIS, ROUTINE W REFLEX MICROSCOPIC
Glucose, UA: NEGATIVE mg/dL
Hgb urine dipstick: NEGATIVE
Ketones, ur: 40 mg/dL — AB
Nitrite: NEGATIVE
Protein, ur: 100 mg/dL — AB
Specific Gravity, Urine: >1.03 — ABNORMAL HIGH (ref 1.005–1.030)
pH: 6 (ref 5.0–8.0)

## 2024-07-22 LAB — URINALYSIS, MICROSCOPIC (REFLEX)

## 2024-07-22 NOTE — ED Triage Notes (Signed)
 The daughter brought her mother in because the pt is confused and  Friday she was acting normal  no history of a fall  the pt is oriented only to her name  her speech is not clear

## 2024-07-22 NOTE — ED Triage Notes (Signed)
 The pts hgb is 6.1   acuity increased one attempt made to let the charge nurse know

## 2024-07-22 NOTE — ED Provider Notes (Signed)
 Received call from radiology that patient has a subacute/acute infarct. I informed charge she needs a room immediately. At that time I was also informed that she is anemic to 5.8.    Lorette Mayo, MD 07/22/24 [phone]

## 2024-07-23 ENCOUNTER — Inpatient Hospital Stay (HOSPITAL_COMMUNITY)

## 2024-07-23 DIAGNOSIS — R4701 Aphasia: Secondary | ICD-10-CM | POA: Diagnosis not present

## 2024-07-23 DIAGNOSIS — I639 Cerebral infarction, unspecified: Secondary | ICD-10-CM | POA: Diagnosis present

## 2024-07-23 DIAGNOSIS — I69398 Other sequelae of cerebral infarction: Secondary | ICD-10-CM | POA: Diagnosis not present

## 2024-07-23 DIAGNOSIS — R1310 Dysphagia, unspecified: Secondary | ICD-10-CM | POA: Diagnosis present

## 2024-07-23 DIAGNOSIS — D649 Anemia, unspecified: Secondary | ICD-10-CM | POA: Diagnosis present

## 2024-07-23 DIAGNOSIS — R29704 NIHSS score 4: Secondary | ICD-10-CM | POA: Diagnosis not present

## 2024-07-23 DIAGNOSIS — I82811 Embolism and thrombosis of superficial veins of right lower extremities: Secondary | ICD-10-CM | POA: Diagnosis not present

## 2024-07-23 DIAGNOSIS — I63412 Cerebral infarction due to embolism of left middle cerebral artery: Secondary | ICD-10-CM | POA: Diagnosis not present

## 2024-07-23 DIAGNOSIS — E785 Hyperlipidemia, unspecified: Secondary | ICD-10-CM | POA: Diagnosis present

## 2024-07-23 DIAGNOSIS — R9431 Abnormal electrocardiogram [ECG] [EKG]: Secondary | ICD-10-CM | POA: Diagnosis not present

## 2024-07-23 DIAGNOSIS — G936 Cerebral edema: Secondary | ICD-10-CM | POA: Diagnosis present

## 2024-07-23 DIAGNOSIS — R471 Dysarthria and anarthria: Secondary | ICD-10-CM | POA: Diagnosis present

## 2024-07-23 DIAGNOSIS — I6340 Cerebral infarction due to embolism of unspecified cerebral artery: Secondary | ICD-10-CM | POA: Diagnosis not present

## 2024-07-23 DIAGNOSIS — I69391 Dysphagia following cerebral infarction: Secondary | ICD-10-CM | POA: Diagnosis not present

## 2024-07-23 DIAGNOSIS — Z91013 Allergy to seafood: Secondary | ICD-10-CM | POA: Diagnosis not present

## 2024-07-23 DIAGNOSIS — R188 Other ascites: Secondary | ICD-10-CM | POA: Diagnosis not present

## 2024-07-23 DIAGNOSIS — D75839 Thrombocytosis, unspecified: Secondary | ICD-10-CM | POA: Diagnosis not present

## 2024-07-23 DIAGNOSIS — I69320 Aphasia following cerebral infarction: Secondary | ICD-10-CM | POA: Diagnosis not present

## 2024-07-23 DIAGNOSIS — I739 Peripheral vascular disease, unspecified: Secondary | ICD-10-CM | POA: Diagnosis not present

## 2024-07-23 DIAGNOSIS — R000 Tachycardia, unspecified: Secondary | ICD-10-CM | POA: Diagnosis not present

## 2024-07-23 DIAGNOSIS — D376 Neoplasm of uncertain behavior of liver, gallbladder and bile ducts: Secondary | ICD-10-CM | POA: Diagnosis not present

## 2024-07-23 DIAGNOSIS — Z79899 Other long term (current) drug therapy: Secondary | ICD-10-CM | POA: Diagnosis not present

## 2024-07-23 DIAGNOSIS — K3189 Other diseases of stomach and duodenum: Secondary | ICD-10-CM | POA: Diagnosis not present

## 2024-07-23 DIAGNOSIS — I10 Essential (primary) hypertension: Secondary | ICD-10-CM | POA: Diagnosis present

## 2024-07-23 DIAGNOSIS — Z7952 Long term (current) use of systemic steroids: Secondary | ICD-10-CM | POA: Diagnosis not present

## 2024-07-23 DIAGNOSIS — C786 Secondary malignant neoplasm of retroperitoneum and peritoneum: Secondary | ICD-10-CM | POA: Diagnosis not present

## 2024-07-23 DIAGNOSIS — R933 Abnormal findings on diagnostic imaging of other parts of digestive tract: Secondary | ICD-10-CM | POA: Diagnosis not present

## 2024-07-23 DIAGNOSIS — I63512 Cerebral infarction due to unspecified occlusion or stenosis of left middle cerebral artery: Secondary | ICD-10-CM | POA: Diagnosis not present

## 2024-07-23 DIAGNOSIS — F09 Unspecified mental disorder due to known physiological condition: Secondary | ICD-10-CM | POA: Diagnosis not present

## 2024-07-23 DIAGNOSIS — Z7901 Long term (current) use of anticoagulants: Secondary | ICD-10-CM | POA: Diagnosis not present

## 2024-07-23 DIAGNOSIS — D72829 Elevated white blood cell count, unspecified: Secondary | ICD-10-CM | POA: Diagnosis present

## 2024-07-23 DIAGNOSIS — Z86718 Personal history of other venous thrombosis and embolism: Secondary | ICD-10-CM | POA: Diagnosis not present

## 2024-07-23 DIAGNOSIS — I82401 Acute embolism and thrombosis of unspecified deep veins of right lower extremity: Secondary | ICD-10-CM | POA: Diagnosis not present

## 2024-07-23 DIAGNOSIS — I2699 Other pulmonary embolism without acute cor pulmonale: Secondary | ICD-10-CM | POA: Diagnosis not present

## 2024-07-23 LAB — CBC WITH DIFFERENTIAL/PLATELET
Abs Immature Granulocytes: 0.2 K/uL — ABNORMAL HIGH (ref 0.00–0.07)
Basophils Absolute: 0.1 K/uL (ref 0.0–0.1)
Basophils Relative: 1 %
Eosinophils Absolute: 0.1 K/uL (ref 0.0–0.5)
Eosinophils Relative: 1 %
HCT: 28.3 % — ABNORMAL LOW (ref 36.0–46.0)
Hemoglobin: 8.9 g/dL — ABNORMAL LOW (ref 12.0–15.0)
Immature Granulocytes: 1 %
Lymphocytes Relative: 15 %
Lymphs Abs: 2.3 K/uL (ref 0.7–4.0)
MCH: 24.8 pg — ABNORMAL LOW (ref 26.0–34.0)
MCHC: 31.4 g/dL (ref 30.0–36.0)
MCV: 78.8 fL — ABNORMAL LOW (ref 80.0–100.0)
Monocytes Absolute: 1.7 K/uL — ABNORMAL HIGH (ref 0.1–1.0)
Monocytes Relative: 12 %
Neutro Abs: 10.5 K/uL — ABNORMAL HIGH (ref 1.7–7.7)
Neutrophils Relative %: 70 %
Platelets: 446 K/uL — ABNORMAL HIGH (ref 150–400)
RBC: 3.59 MIL/uL — ABNORMAL LOW (ref 3.87–5.11)
RDW: 16.3 % — ABNORMAL HIGH (ref 11.5–15.5)
WBC: 15 K/uL — ABNORMAL HIGH (ref 4.0–10.5)
nRBC: 0.1 % (ref 0.0–0.2)

## 2024-07-23 LAB — POC OCCULT BLOOD, ED: Fecal Occult Bld: NEGATIVE

## 2024-07-23 LAB — HEMOGLOBIN A1C
Hgb A1c MFr Bld: 5.2 % (ref 4.8–5.6)
Mean Plasma Glucose: 102.54 mg/dL

## 2024-07-23 LAB — VITAMIN B12: Vitamin B-12: 1321 pg/mL — ABNORMAL HIGH (ref 180–914)

## 2024-07-23 LAB — RETICULOCYTES
Immature Retic Fract: 29.1 % — ABNORMAL HIGH (ref 2.3–15.9)
RBC.: 2.4 MIL/uL — ABNORMAL LOW (ref 3.87–5.11)
Retic Count, Absolute: 74.2 K/uL (ref 19.0–186.0)
Retic Ct Pct: 3.1 % (ref 0.4–3.1)

## 2024-07-23 LAB — PREPARE RBC (CROSSMATCH)

## 2024-07-23 LAB — ABO/RH: ABO/RH(D): O POS

## 2024-07-23 LAB — IRON AND TIBC
Iron: <10 ug/dL — ABNORMAL LOW (ref 28–170)
TIBC: 339 ug/dL (ref 250–450)

## 2024-07-23 LAB — FERRITIN: Ferritin: 17 ng/mL (ref 11–307)

## 2024-07-23 LAB — CBG MONITORING, ED: Glucose-Capillary: 77 mg/dL (ref 70–99)

## 2024-07-23 LAB — FOLATE: Folate: >20 ng/mL (ref >5.9)

## 2024-07-23 MED ORDER — PANTOPRAZOLE SODIUM 40 MG IV SOLR
40.0000 mg | Freq: Two times a day (BID) | INTRAVENOUS | Status: DC
  Administered 2024-07-23 – 2024-07-30 (×32): 40 mg via INTRAVENOUS
  Filled 2024-07-23 (×17): qty 10

## 2024-07-23 MED ORDER — DEXTROSE-SODIUM CHLORIDE 5-0.9 % IV SOLN: INTRAVENOUS | Status: DC

## 2024-07-23 MED ORDER — SODIUM CHLORIDE 0.9% IV SOLUTION: Freq: Once | INTRAVENOUS | Status: AC

## 2024-07-23 MED ORDER — STROKE: EARLY STAGES OF RECOVERY BOOK
Freq: Once | Status: AC
  Filled 2024-07-23: qty 1

## 2024-07-23 MED ORDER — IOHEXOL 350 MG/ML SOLN
150.0000 mL | Freq: Once | INTRAVENOUS | Status: AC | PRN
  Administered 2024-07-23 (×2): 150 mL via INTRAVENOUS

## 2024-07-23 NOTE — ED Notes (Signed)
 MD at bedside for occult stool collection

## 2024-07-23 NOTE — ED Notes (Signed)
 Remained with pt for 15 minutes post transfusion. No signs or symptoms of transfusion reaction observed. VSS see flowsheet for vitals.

## 2024-07-23 NOTE — ED Notes (Signed)
 Pt blood consent in chart, agrees to risks and benefits as explained by provider. See flowsheet for pre transfusion vitals. Dual chamber Y set filter used.

## 2024-07-23 NOTE — Evaluation (Signed)
 Physical Therapy Evaluation Patient Details Name: Tiffany [name] MRN: [id number] DOB: 1941-07-17 Today's Date: 07/23/2024  History of Present Illness  Tiffany [name] is a 83 y.o. female with a PMHx of HTN, recently started on Eliquis  per report, presenting to the ED with garbled speech. Daughter brought her in for what was thought to be confusion and not acting normally. The patient was oriented only to her name on arrival. Labs revealed severe anemia with a Hgb of 6.1. CT head revealed a subacute left temporo-occipital ischemic infarct; received transfusion in the ED; with a PMHx of HTN, recently started on Eliquis  per report  Clinical Impression   Pt admitted with above diagnosis. 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 Pt will benefit from skilled PT to increase their independence and safety with mobility to allow discharge to the venue listed below.       Will need more information re: home setup, PLOF to be able to generate more patient-centered goals, and will update goals as that info becomes available      If plan is discharge home, recommend the following: A little help with walking and/or transfers;A little help with bathing/dressing/bathroom;Assistance with cooking/housework   Can travel by private vehicle         Equipment Recommendations Other (comment) (to be determined)  Recommendations for Other Services  OT consult;Speech consult (as ordered)    Functional Status Assessment Patient has had a recent decline in their functional status and demonstrates the ability to make significant improvements in function in a reasonable and predictable amount of time.     Precautions / Restrictions Precautions Precautions: Fall Recall of Precautions/Restrictions: Impaired Restrictions Weight Bearing Restrictions Per Provider Order: No      Mobility  Bed Mobility                    Transfers                        Ambulation/Gait                  Stairs            Wheelchair Mobility     Tilt Bed    Modified Rankin (Stroke Patients Only) Modified Rankin (Stroke Patients Only) Pre-Morbid Rankin Score: No symptoms Modified Rankin:  (to be determined)     Balance                                             Pertinent Vitals/Pain Pain Assessment Pain Assessment: Faces Faces Pain Scale: No hurt Pain Intervention(s): Monitored during session    Home Living Family/patient expects to be discharged to:: Private residence Living Arrangements: Children (lives with her  daughter, and daughter's family) Available Help at Discharge: Family Type of Home: House             Additional Comments: Pt with difficulty answering specific questions about home    Prior Function Prior Level of Function : Independent/Modified Independent (Per chart review)                     Extremity/Trunk Assessment   Upper Extremity Assessment Upper Extremity Assessment: Defer to OT evaluation (able to raise arms against gravity, observable lag RUE compared to LUE; Able to flex and extend elbows with multimodal cueing)    Lower Extremity Assessment Lower Extremity Assessment: Difficult to assess due to impaired cognition (Able to activate quadriceps  and extend knees with extensive cueing; grossly equal strength bilaterally, but unable to test functional strength with stnding)    Cervical / Trunk Assessment Cervical / Trunk Assessment: Normal  Communication   Communication Communication: Impaired Factors Affecting Communication: Difficulty expressing self    Cognition Arousal: Alert Behavior During Therapy: WFL for tasks assessed/performed   PT - Cognitive impairments: No family/caregiver present to determine baseline, Sequencing                       PT - Cognition Comments: Noting difficulty reproducing movement of UEs and LEs after demonstration Following commands: Impaired Following commands impaired: Follows one step commands inconsistently (and multimodal cueing)     Cueing Cueing Techniques: Verbal cues, Gestural cues, Tactile cues, Visual cues     General Comments General comments (skin integrity, edema, etc.): Politely declining getting up to stand and walk; would reference a time when she was walking earlier in the morning, and answer no to requests to get up and work with PT    Exercises     Assessment/Plan    PT Assessment Patient needs continued PT services  PT Problem List Decreased strength;Decreased activity tolerance;Decreased cognition;Decreased knowledge of use of DME;Decreased safety awareness;Decreased knowledge of precautions;Decreased balance;Decreased mobility;Decreased coordination       PT Treatment Interventions DME instruction;Gait training;Stair training;Functional mobility training;Therapeutic activities;Therapeutic exercise;Balance training;Neuromuscular re-education;Cognitive remediation;Patient/family education;Manual techniques    PT Goals (Current goals can be found in the Care Plan section)  Acute Rehab PT Goals Patient Stated Goal: to take a nap PT Goal Formulation: Patient unable to participate in goal setting Time For Goal Achievement: 08/06/24 Potential to Achieve  Goals: Fair    Frequency Min 4X/week     Co-evaluation               AM-PAC PT 6 Clicks Mobility  Outcome Measure Help needed turning from your back to your side while in a flat bed without using bedrails?: None Help needed moving from lying on your back to sitting on the side of a flat bed without using bedrails?: A Lot Help needed moving to and from a bed to a chair (including a wheelchair)?: A Lot Help needed standing up from a chair using your arms (e.g., wheelchair or bedside chair)?: A Lot Help needed to walk in hospital room?: A Lot Help needed climbing 3-5 steps with a railing? : A Lot 6 Click Score: 14    End of Session   Activity Tolerance: Patient tolerated treatment well Patient left: in bed;with call bell/phone within reach Nurse Communication: Mobility status PT Visit Diagnosis: Other abnormalities of gait and mobility (R26.89);Other symptoms and signs involving the nervous system (M70.101)    Time: 9089-9078 PT Time Calculation (min) (ACUTE ONLY):  11 min   Charges:   PT Evaluation $PT Eval Moderate Complexity: 1 Mod   PT General Charges $$ ACUTE PT VISIT: 1 Visit         Silvano Currier, PT  Acute Rehabilitation Services Office [phone] Secure Chat welcomed   Silvano VEAR Currier 07/23/2024, 1:13 PM

## 2024-07-23 NOTE — Progress Notes (Addendum)
 STROKE TEAM PROGRESS NOTE   INTERIM HISTORY/SUBJECTIVE Started on eliquis  07/11/2024 for DVT  This morning patient is relatively confused and has difficulty following multistep commands.  OBJECTIVE  CBC    Component Value Date/Time   WBC 16.0 (H) 07/22/2024 2221   RBC 2.40 (L) 07/23/2024 0138   RBC 2.55 (L) 07/22/2024 2221   HGB 6.1 (LL) 07/22/2024 2303   HCT 18.0 (L) 07/22/2024 2303   PLT 597 (H) 07/22/2024 2221   MCV 76.1 (L) 07/22/2024 2221   MCH 22.0 (L) 07/22/2024 2221   MCHC 28.9 (L) 07/22/2024 2221   RDW 16.8 (H) 07/22/2024 2221    BMET    Component Value Date/Time   NA 138 07/22/2024 2303   K 3.6 07/22/2024 2303   CL 106 07/22/2024 2303   CO2 18 (L) 07/22/2024 2221   GLUCOSE 95 07/22/2024 2303   BUN 14 07/22/2024 2303   CREATININE 0.80 07/22/2024 2303   CALCIUM  8.7 (L) 07/22/2024 2221   GFRNONAA >60 07/22/2024 2221    IMAGING past 24 hours CT HEAD WO CONTRAST Result Date: 07/22/2024 CLINICAL DATA:  Memory loss and altered mental status. EXAM: CT HEAD WITHOUT CONTRAST TECHNIQUE: Contiguous axial images were obtained from the base of the skull through the vertex without intravenous contrast. RADIATION DOSE REDUCTION: This exam was performed according to the departmental dose-optimization program which includes automated exposure control, adjustment of the mA and/or kV according to patient size and/or use of iterative reconstruction technique. COMPARISON:  Head CT 04/28/2024. FINDINGS: Brain: There is an acute/early subacute left occipitotemporal nonhemorrhagic infarct involving the posterior temporal lobe from the posterior sylvian cortex, extending contiguously posteriorly to the lateral occipital lobe over an area measuring 7.1 cm AP, 3.0 cm coronal, 3.3 cm in height. There is hyperdensity of the infarcting tissue, loss of sulci and loss of gray-white matter differentiation. The infarct extends from the cortical surface to the ventricular interface, without hemorrhage or  downward mass effect or notable effacement of the posterior horn of the left ventricle. There is no further evidence of infarcts. There is mild cerebral atrophy and small-vessel disease and mild-to-moderate atrophic ventriculomegaly which was seen previously. Cerebellum and brainstem are unremarkable. No midline shift. Basal cisterns are clear. Vascular: Despite the findings I do not see a hyperdense vessel. There are calcific plaques both siphons, distal left vertebral artery. Skull: Negative for fractures. There is patchy sclerosis and thickening of the squamous portion of the left temporal bone. In retrospect this was present on the prior CT and it seems to have been present on MRI from 12/28/2021. This could represent pagetoid changes of the bone or changes due to fibrous dysplasia, but a bone metastasis is not strictly excluded. Rest of the calvarium is unremarkable. Sinuses/Orbits: No acute finding. Old lens replacements. Clear sinuses and mastoids. Other: None. IMPRESSION: 1. 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 There sulcal effacement but no significant mass effect. 2. Atrophy and small-vessel disease.  Otherwise stable exam. 3. Patchy sclerosis and thickening of the squamous portion of the left temporal bone. No change since the 04/28/2024 CT, and appears to have been present on a 12/28/2021 MRI brain although not as well depicted. This could represent pagetoid changes of the bone or changes due to fibrous dysplasia, but a bone metastasis is not strictly excluded. 4. PRA is attempting to reach the ordering physician for stat notification at the time of signing. Electronically Signed   By: Francis Quam M.D.   On: 07/22/2024 23:32    Vitals:  07/23/24 0721 07/23/24 0730 07/23/24 0737 07/23/24 0742  BP: (!) 140/75 134/74    Pulse: 92  98 95  Resp: 18  20 19   Temp: 98.7 F (37.1 C)     TempSrc:      SpO2: 100% 100% 99% 100%  Weight:      Height:         PHYSICAL  EXAM General:  Alert, well-nourished, well-developed patient in no acute distress Psych:  Mood and affect appropriate for situation CV: Regular rate and rhythm on monitor Respiratory:  Regular, unlabored respirations on room air GI: Abdomen soft and nontender   NEURO:  Mental Status: Awake and alert, able to state her name.  She does perseverate repeating the questions that were initially asked, and then will eventually say I do not know.  She is able to complete one-step commands but has difficulty with multistep commands. Cranial Nerves:  II: PERRL.  Neglect to the right visual field and does not blink to threat on the right. III, IV, VI: Has a left gaze preference but does cross midline V: Sensation is intact to light touch and symmetrical to face.  VII: Face is symmetrical resting and smiling VIII: hearing intact to voice. IX, X: Palate elevates symmetrically. Phonation is normal.  KP:Dynloizm shrug 5/5. XII: tongue is midline without fasciculations. Motor: 5/5 strength to all muscle groups tested.  Tone: is normal and bulk is normal Sensation- Intact to light touch bilaterally. Extinction absent to light touch to DSS.   Coordination: FTN intact bilaterally, HKS: no ataxia in BLE.No drift.  Gait- deferred  Most Recent NIH 4     ASSESSMENT/PLAN  [name] is a 83 y.o. female with history of hypertension, DVT recently started on Eliquis  admitted for confusion and garbled speech.    Acute Ischemic Infarct:  left occipitotemporal infarct  Etiology: Embolic Code Stroke CT head- Acute/early subacute nonhemorrhagic left occipitotemporal infarct. There sulcal effacement but no  significant mass effect. Atrophy and small-vessel disease.  Otherwise stable exam. Patchy sclerosis and thickening of the squamous portion of the left temporal bone. No change since the 04/28/2024 CT, and appears to have been present on a 12/28/2021 MRI brain although not as well depicted. This could  represent pagetoid changes of the bone or changes due to fibrous dysplasia, but a bone metastasis is not strictly excluded. CTA head & neck left MCA M3-M4 branch occlusion MRI pending 9/9- Venous duplex - Positive for DVT in the popliteal and peroneal veins. Occlusive thrombus in the small saphenous vein. 2D Echo pending LDL No results found for requested labs within last 1095 days. HgbA1c 5.2 VTE prophylaxis - SCDs Eliquis  (apixaban ) daily prior to admission, resume eliquis  once hgb stabilizes  Therapy recommendations:  Pending Disposition:  Pending   DVT Started on Eliquis  on 9/9  Anemia Concern for GI bleeding GI consulted- recommend outpatient colonoscopy  No active bleeding at this time. Follow hgb tomorrow and resume eliquis  if stable Hgb 5.6 -> 6.1 -> 8.9 2u RBC  Hypertension Home meds:  Metoprolol   Stable Blood Pressure Goal: BP less than 220/110   Hyperlipidemia LDL Pending, goal < 70 Continue statin at discharge  Dysphagia Patient has post-stroke dysphagia, SLP consulted    Diet   Diet NPO time specified Except for: Ice Chips, Sips with Meds   Advance diet as tolerated   Hospital day # 0  Patient seen and examined by NP/APP with MD. MD to update note as needed.    Jorene Last,  DNP, FNP-BC [facility] Pager: [phone]    ATTENDING NOTE: I reviewed above note and agree with the assessment and plan. Pt was seen and examined. Awake, alert but there is mild confusion and patient perseverate. No family at bedside. Will likely restart Eliquis  tomorrow morning once Hg stabilizes.  Spent a total of 35 minutes dedicated to the care of this patient.  For detailed assessment and plan, please refer to above as I have made changes wherever appropriate.    Pastor Falling, MD  Stroke Neurology 07/23/2024 3:02 PM     To contact Stroke Continuity provider, please refer to [email or web address]. After hours, contact General Neurology

## 2024-07-23 NOTE — ED Notes (Signed)
 Nt called CCMD@2 :37am

## 2024-07-23 NOTE — ED Notes (Signed)
 Pt and all belongings transported upstairs with transport at this time

## 2024-07-23 NOTE — H&P (Addendum)
 [facility] HPI   Krysti Hickling FMW:[id number] DOB: [birth year] DOA: 07/22/2024 From: Home with daughter code Status full code  PCP: Arvid Collar, FNP   Note that patient has another chart MRN: [id number]   Chief Complaint: Confusion  HPI:  83 year old female -originally from [place] hypertension, dyslipidemia, mild carotid artery stenosis, and mild mitral regurgitation.  She had an admission for SVT Spoke with daughter who is an Charity fundraiser-- She is quite functional at baseline recent trip to [place]  by herself culminating in lower extremity swelling seen by Dr. Benjamine started elliquis 2 weeks-around 9 September--- no reports of dark or tarry stools at home Mainly confusion--when daughter came home on 9/20 and asked her how she was doing---couldn't turn on phone or the water---was not making sense to daughter---she was answering something different to daughter direct line questioning--she was rambling per daughter---this is not her normal This happened within the past 24 hours Relatively healthy on only metoprolol  and vitamins etc  Review of Systems:  As mentioned above in HPI are pertinent +'s Pertinent negatives as per below  Cannot obtain ROS  ED Course: Neurology was consulted she was kept n.p.o. had not passed swallow screen and transfused 2 units of blood which are running now   Past Medical History:  Diagnosis Date   Hypertension    History reviewed. No pertinent surgical history.  reports that she has never smoked. She has never been exposed to tobacco smoke. She has never used smokeless tobacco. She reports current alcohol use. She reports that she does not use drugs.  Mobility: At baseline previously independent  Allergies  Allergen Reactions   Shellfish Allergy    History reviewed. No pertinent family history. Prior to Admission medications   Not on File    Physical Exam:  Vitals:   07/23/24 0700 07/23/24 0714  BP: 137/72 138/77  Pulse: 92 95  Resp: 18 20   Temp:  98.7 F (37.1 C)  SpO2: 100% 100%    Awake but somewhat incoherent person and situation very pleasant however Does not understand when I ask her to follow commands cannot do finger-nose to finger grossly normal power Some dysarthria but not severe she is making good sentences with relatively good prosody but is confused S1-S2 no murmur seems to have PVCs Abdomen soft Rectal exam personally performed in presence of chaperone 12:00 hemorrhoid stool brownish with mucoid features no blood on glove No lower extremity edema   I have personally reviewed following labs and imaging studies  Labs:  Sodium 138 potassium 3.6 bicarb 18 BUN/creatinine 15/0.8 alk phos 149 Hemoglobin 5.6 platelet 597 UA ketones leukocytes small protein 100 specific gravity >1.03 Many bacteria Iron less than 10 saturation ratio not calculated  Imaging studies:  CT head acute early subacute nonhemorrhagic left occipital temporal infarct sulcal effacement no significant mass effect patchy sclerosis sickening squamous portion left temporal bone no change since 04/21/2024?  Pagetoid changes of bone?  Fibrous dysplasia  Medical tests:  EKG independently reviewed: Sinus rhythm PR interval 0.12 QRS axis leftward no ST-T wave changes across precordium borderline LVH by STs criteria and  Test discussed with performing physician: No  Decision to obtain old records:  Yes  Review and summation of old records:  Yes  Principal Problem:   Aphasia   Assessment/Plan Stroke Per stroke order set which I have ordered-obtain echo, MRI brain Neurology feel that the stroke is relatively large based on what they see and that patient is not a candidate for any type of  antiplatelet blood thinner at this time so we will hold until we can get this worked up She will be admitted to telemetry she has a prior history of SVT so she may require further acute monitoring and admission per the stroke order set Allow permissive  hypertension, hold metoprolol  at this time Recent DVT-provoked by air travel Likely will need reconsideration of once we can get clearance from GI She has had several Cologuard test that were negative most recently per her last record about a year ago Her last colonoscopy was in [place] more than 10 years ago GI is aware of the patient I sent a secure message to the on-call--addend--d/w dr stacia GI subesequently [and did alert day-time rounder ]who feels that work-up is low-yield without active bleed and neg Hemoccult, and would old on anesthesia and GI work-up at this time given recent CVA The Hemoccult is negative based on report Her last hemoglobin [in her other chart ]is around 8.4 raising the question for this either being a slight hemorrhoidal bleed or anemia of chronic blood loss-because it is microcytic iron studies were done and she will need a dose of IV iron Will start IV PPI Leukocytosis without source Labs need to be repeated I would hold antibiotics for now I am not convinced that her UA represents a UTI she does not have a fever Would watch her only at this time Previous history SVT Only monitor at this time   Severity of Illness: The appropriate patient status for this patient is INPATIENT. Inpatient status is judged to be reasonable and necessary in order to provide the required intensity of service to ensure the patient's safety. The patient's presenting symptoms, physical exam findings, and initial radiographic and laboratory data in the context of their chronic comorbidities is felt to place them at high risk for further clinical deterioration. Furthermore, it is not anticipated that the patient will be medically stable for discharge from the hospital within 2 midnights of admission.   * I certify that at the point of admission it is my clinical judgment that the patient will require inpatient hospital care spanning beyond 2 midnights from the point of admission due to  high intensity of service, high risk for further deterioration and high frequency of surveillance required.*   Family Communication: See above  DVT ppx: SCD Consults called & Whom: Neurology and gastroenterology  Time spent: 70 minutes  [place], MD [days-call my NP partners at night for Care related issues] [facility] --[employer] [place] , [email or web address]; password [email or web address]  07/23/2024, 7:16 AM

## 2024-07-23 NOTE — Plan of Care (Signed)

## 2024-07-23 NOTE — Consult Note (Signed)
 NEUROLOGY CONSULT NOTE   Date of service: July 23, 2024 Patient Name: Tiffany [name] MRN:  [id number] DOB:  [birth year] Chief Complaint: Confusion Requesting Provider: Shona Terry SAILOR, DO  History of Present Illness  Tiffany [name] is a 83 y.o. female with a PMHx of HTN, recently started on Eliquis  per report, presenting to the ED with garbled speech. Daughter brought her in for what was thought to be confusion and not acting normally. The patient was oriented only to her name on arrival. Labs revealed severe anemia with a Hgb of 6.1. CT head revealed a subacute left temporo-occipital ischemic infarct.    ROS  Unable to obtain due to aphasia.   Past History   Past Medical History:  Diagnosis Date   Hypertension     History reviewed. No pertinent surgical history.  Family History: History reviewed. No pertinent family history.  Social History  reports that she has never smoked. She has never been exposed to tobacco smoke. She has never used smokeless tobacco. She reports current alcohol use. She reports that she does not use drugs.  Allergies  Allergen Reactions   Shellfish Allergy     Medications   Current Facility-Administered Medications:    0.9 %  sodium chloride  infusion (Manually program via Guardrails IV Fluids), , Intravenous, Once, Mesner, Selinda, MD No current outpatient medications on file.  No current facility-administered medications on file prior to encounter.   No current outpatient medications on file prior to encounter.     Vitals   Vitals:   07/23/24 0049 07/23/24 0100 07/23/24 0115 07/23/24 0130  BP: 128/74 123/62 113/88 133/67  Pulse: (!) 113   (!) 110  Resp: (!) 27 13 (!) 24 16  Temp: 98.6 F (37 C)     SpO2: 100%     Weight:      Height:        Body mass index is 25.39 kg/m.   Physical Exam   Constitutional: Appears well-developed and well-nourished.  Psych: Affect appropriate to situation.  Eyes: No scleral injection.   HENT: No OP obstruction.  Head: Normocephalic.  Respiratory: Effort normal, non-labored breathing.    Neurologic Examination   Mental Status: Alert. Expressive and receptive aphasia is noted. Will perseverate by repeating answers to orientation questions given to her by examiner. Not able to answer any orientation questions correctly. Her speech output is with short 1-3 word phrases only. Has difficulty following several commands, but could participate in strength testing against resistance with some difficulty. Cranial Nerves: II: Blinks to threat in left visual field. No blink to threat in right visual field. PERRL  III,IV, VI: No ptosis. EOMI. No nystagmus.  V: Reacts to eyelid stimulation bilaterally  VII: Smile symmetric VIII: Hearing intact to voice IX,X: No hoarseness XI: Symmetric  XII: Does not follow command for assessment. No lingual dysarthria.  Motor: BUE 5/5 proximally and distally BLE 5/5 proximally and distally  No pronator drift.  Sensory: Reacts to touch x 4.   Deep Tendon Reflexes: 1-2+ and symmetric throughout Cerebellar: No gross ataxia noted  Gait: Deferred  Labs/Imaging/Neurodiagnostic studies   CBC:  Recent Labs  Lab August 09, 2024 2221 09-Aug-2024 2303  WBC 16.0*  --   HGB 5.6* 6.1*  HCT 19.4* 18.0*  MCV 76.1*  --   PLT 597*  --    Basic Metabolic Panel:  Lab Results  Component Value Date   NA 138 August 09, 2024   K 3.6 2024-08-09   CO2 18 (L) 08-09-24   GLUCOSE  95 07/22/2024   BUN 14 07/22/2024   CREATININE 0.80 07/22/2024   CALCIUM  8.7 (L) 07/22/2024   GFRNONAA >60 07/22/2024     ASSESSMENT  Tiffany [name] is a 83 y.o. female with a PMHx of HTN, recently started on Eliquis  per report, presenting to the ED with garbled speech. Daughter brought her in for what was thought to be confusion and not acting normally. The patient was oriented only to her name on arrival. Labs revealed severe anemia with a Hgb of 6.1. CT head revealed a subacute left  temporo-occipital ischemic infarct.  - Exam reveals expressive and receptive aphasia, in addition to right visual field cut. No focal weakness noted.  - CT head: Acute/early subacute nonhemorrhagic left occipitotemporal infarct. There sulcal effacement but no significant mass effect. Atrophy and small-vessel disease.  Otherwise stable exam. Patchy sclerosis and thickening of the squamous portion of the left temporal bone. No change since the 04/28/2024 CT, and appears to have been present on a 12/28/2021 MRI brain although not as well depicted. This could represent pagetoid changes of the bone or changes due to fibrous dysplasia, but a bone metastasis is not strictly excluded.  - EKG: Sinus tachycardia; Abnormal R-wave progression, early transition - Labs: Elevated WBC of 16. Low Hgb of 6.1. Platelets 597. U/A with many bacteria and small number of leukocytes, nitrite negative. Serum iron level < 10. Na, K and ionized Ca normal. BUN and Cr normal. AST and ALT normal.  - Impression:  - Acute ischemic stroke. DDx for underlying etiology includes cardioembolic and atherothrombotic.  - Severe anemia. EDP suspects a possible occult bleed.   RECOMMENDATIONS  - Volume repletion per primary team - Eliquis  is being held due to suspicion for occult bleed.  - She is currently receiving a blood transfusion - HgbA1c, fasting lipid panel - MRI brain - CTA of head and neck - BP management per primary team in the context of volume repletion protocol.  - PT consult, OT consult, Speech consult - TTE - Statin if there is no contraindication - Risk factor modification - Telemetry monitoring - Frequent neuro checks - NPO until passes stroke swallow screen ______________________________________________________________________    [name], Tallyn Holroyd, MD [facility]

## 2024-07-23 NOTE — ED Notes (Signed)
 Pt returned from CT scan.

## 2024-07-23 NOTE — ED Notes (Signed)
 Assumed care of pt at this time. Iunit of blood complete, no signs reaction pt VSS, calm resting. NT sent to get second unit

## 2024-07-23 NOTE — Progress Notes (Signed)
 PT Cancellation Note  Patient Details Name: Tiffany [name] MRN: [id number] DOB: 15-Jul-1941   Cancelled Treatment:    Reason Eval/Treat Not Completed: Other (comment)  Returned for further functional mobility evaluation, however pt was sleeping soundly;  Plan to follow up tomorrow;   Silvano Currier, PT  Acute Rehabilitation Services Office [phone] Secure Chat welcomed    Silvano VEAR Currier 07/23/2024, 3:56 PM

## 2024-07-24 ENCOUNTER — Inpatient Hospital Stay (HOSPITAL_COMMUNITY)

## 2024-07-24 DIAGNOSIS — R4701 Aphasia: Secondary | ICD-10-CM | POA: Diagnosis not present

## 2024-07-24 DIAGNOSIS — I739 Peripheral vascular disease, unspecified: Secondary | ICD-10-CM | POA: Diagnosis not present

## 2024-07-24 DIAGNOSIS — I6340 Cerebral infarction due to embolism of unspecified cerebral artery: Secondary | ICD-10-CM

## 2024-07-24 DIAGNOSIS — R9431 Abnormal electrocardiogram [ECG] [EKG]: Secondary | ICD-10-CM | POA: Diagnosis not present

## 2024-07-24 DIAGNOSIS — R29704 NIHSS score 4: Secondary | ICD-10-CM | POA: Diagnosis not present

## 2024-07-24 DIAGNOSIS — I69391 Dysphagia following cerebral infarction: Secondary | ICD-10-CM

## 2024-07-24 LAB — CBC WITH DIFFERENTIAL/PLATELET
Abs Immature Granulocytes: 0.18 K/uL — ABNORMAL HIGH (ref 0.00–0.07)
Basophils Absolute: 0.1 K/uL (ref 0.0–0.1)
Basophils Relative: 0 %
Eosinophils Absolute: 0.2 K/uL (ref 0.0–0.5)
Eosinophils Relative: 2 %
HCT: 26.5 % — ABNORMAL LOW (ref 36.0–46.0)
Hemoglobin: 8.6 g/dL — ABNORMAL LOW (ref 12.0–15.0)
Immature Granulocytes: 1 %
Lymphocytes Relative: 17 %
Lymphs Abs: 2.4 K/uL (ref 0.7–4.0)
MCH: 24.7 pg — ABNORMAL LOW (ref 26.0–34.0)
MCHC: 32.5 g/dL (ref 30.0–36.0)
MCV: 76.1 fL — ABNORMAL LOW (ref 80.0–100.0)
Monocytes Absolute: 1.8 K/uL — ABNORMAL HIGH (ref 0.1–1.0)
Monocytes Relative: 14 %
Neutro Abs: 8.9 K/uL — ABNORMAL HIGH (ref 1.7–7.7)
Neutrophils Relative %: 66 %
Platelets: 410 K/uL — ABNORMAL HIGH (ref 150–400)
RBC: 3.48 MIL/uL — ABNORMAL LOW (ref 3.87–5.11)
RDW: 16.6 % — ABNORMAL HIGH (ref 11.5–15.5)
WBC: 13.6 K/uL — ABNORMAL HIGH (ref 4.0–10.5)
nRBC: 0.4 % — ABNORMAL HIGH (ref 0.0–0.2)

## 2024-07-24 LAB — COMPREHENSIVE METABOLIC PANEL WITH GFR
ALT: 15 U/L (ref 0–44)
AST: 23 U/L (ref 15–41)
Albumin: 2.3 g/dL — ABNORMAL LOW (ref 3.5–5.0)
Alkaline Phosphatase: 141 U/L — ABNORMAL HIGH (ref 38–126)
Anion gap: 13 (ref 5–15)
BUN: 15 mg/dL (ref 8–23)
CO2: 20 mmol/L — ABNORMAL LOW (ref 22–32)
Calcium: 8.2 mg/dL — ABNORMAL LOW (ref 8.9–10.3)
Chloride: 105 mmol/L (ref 98–111)
Creatinine, Ser: 0.88 mg/dL (ref 0.44–1.00)
GFR, Estimated: >60 mL/min (ref >60)
Glucose, Bld: 93 mg/dL (ref 70–99)
Potassium: 3.8 mmol/L (ref 3.5–5.1)
Sodium: 138 mmol/L (ref 135–145)
Total Bilirubin: 1.2 mg/dL (ref 0.0–1.2)
Total Protein: 6.5 g/dL (ref 6.5–8.1)

## 2024-07-24 LAB — BPAM RBC
Blood Product Expiration Date: 202510152359
Blood Product Expiration Date: 202510152359
ISSUE DATE / TIME: 202509210320
ISSUE DATE / TIME: 202509210716
Unit Type and Rh: 5100
Unit Type and Rh: 5100

## 2024-07-24 LAB — LIPID PANEL
Cholesterol: 127 mg/dL (ref 0–200)
HDL: 45 mg/dL (ref >40)
LDL Cholesterol: 73 mg/dL (ref 0–99)
Total CHOL/HDL Ratio: 2.8 ratio
Triglycerides: 47 mg/dL (ref <150)
VLDL: 9 mg/dL (ref 0–40)

## 2024-07-24 LAB — ECHOCARDIOGRAM COMPLETE
AR max vel: 1.92 cm2
AV Peak grad: 10.4 mmHg
Ao pk vel: 1.61 m/s
Area-P 1/2: 5.31 cm2
Height: 60 in
S' Lateral: 2.5 cm
Weight: 2080 [oz_av]

## 2024-07-24 LAB — TYPE AND SCREEN
ABO/RH(D): O POS
Antibody Screen: NEGATIVE
Unit division: 0
Unit division: 0

## 2024-07-24 LAB — PROTIME-INR
INR: 1.3 — ABNORMAL HIGH (ref 0.8–1.2)
Prothrombin Time: 17.2 s — ABNORMAL HIGH (ref 11.4–15.2)

## 2024-07-24 MED ORDER — MIRTAZAPINE 15 MG PO TABS
7.5000 mg | ORAL_TABLET | Freq: Every day | ORAL | Status: DC
  Administered 2024-07-24 – 2024-07-30 (×14): 7.5 mg via ORAL
  Filled 2024-07-24 (×8): qty 1

## 2024-07-24 MED ORDER — LATANOPROST 0.005 % OP SOLN
1.0000 [drp] | Freq: Every day | OPHTHALMIC | Status: DC
Start: 2024-07-24 — End: 2024-07-31
  Administered 2024-07-24 – 2024-07-30 (×14): 1 [drp] via OPHTHALMIC
  Filled 2024-07-24 (×2): qty 2.5

## 2024-07-24 MED ORDER — DORZOLAMIDE HCL 2 % OP SOLN
1.0000 [drp] | Freq: Three times a day (TID) | OPHTHALMIC | Status: DC
  Administered 2024-07-24 – 2024-07-31 (×40): 1 [drp] via OPHTHALMIC
  Filled 2024-07-24 (×2): qty 10

## 2024-07-24 MED ORDER — APIXABAN 5 MG PO TABS
5.0000 mg | ORAL_TABLET | Freq: Two times a day (BID) | ORAL | Status: DC
  Administered 2024-07-24 – 2024-07-31 (×29): 5 mg via ORAL
  Filled 2024-07-24 (×15): qty 1

## 2024-07-24 MED ORDER — PREDNISONE 5 MG PO TABS
5.0000 mg | ORAL_TABLET | Freq: Every day | ORAL | Status: DC
  Administered 2024-07-24 – 2024-07-31 (×15): 5 mg via ORAL
  Filled 2024-07-24 (×9): qty 1

## 2024-07-24 NOTE — Progress Notes (Signed)
 PT Cancellation Note  Patient Details Name: Tiffany [name] MRN: [id number] DOB: 15-Mar-1941   Cancelled Treatment:    Reason Eval/Treat Not Completed: Patient at procedure or test/unavailable (MRI)  Aleck Daring, PT, DPT Acute Rehabilitation Services Office [phone]    Aleck ONEIDA Daring 07/24/2024, 12:08 PM

## 2024-07-24 NOTE — Progress Notes (Addendum)
 OT Cancellation Note  Patient Details Name: Tiffany [name] MRN: [id number] DOB: May 22, 1941   Cancelled Treatment:    Reason Eval/Treat Not Completed: Patient at procedure or test/ unavailable. Pt at MRI. Will continue efforts as able.  ADDM at 1533: Attempted second time this PM to see pt, but pt asleep and not agreeable to participate once awakened despite efforts to encourage her. RN updated. Will continue to attempt as able.  Mickie Badders D., MS, OTR/L Acute Rehabilitation Services [phone] Secure Chat Preferred  Ambur Province 07/24/2024, 3:34 PM

## 2024-07-24 NOTE — Progress Notes (Signed)
 Echocardiogram 2D Echocardiogram has been performed.  Tiffany [name] 07/24/2024, 2:32 PM

## 2024-07-24 NOTE — Progress Notes (Signed)
 STROKE TEAM PROGRESS NOTE   INTERIM HISTORY/SUBJECTIVE   This morning patient remains mildly confused and has difficulty following multistep commands and has persistent right field cut.  Vital signs stable.  Neurological exam unchanged..  Echocardiogram and lower extremity venous Dopplers are pending.  Hemoglobin stable at 8.6.  Plan to resume Eliquis .  OBJECTIVE  CBC    Component Value Date/Time   WBC 13.6 (H) 07/24/2024 0217   RBC 3.48 (L) 07/24/2024 0217   HGB 8.6 (L) 07/24/2024 0217   HCT 26.5 (L) 07/24/2024 0217   PLT 410 (H) 07/24/2024 0217   MCV 76.1 (L) 07/24/2024 0217   MCH 24.7 (L) 07/24/2024 0217   MCHC 32.5 07/24/2024 0217   RDW 16.6 (H) 07/24/2024 0217   LYMPHSABS 2.4 07/24/2024 0217   MONOABS 1.8 (H) 07/24/2024 0217   EOSABS 0.2 07/24/2024 0217   BASOSABS 0.1 07/24/2024 0217    BMET    Component Value Date/Time   NA 138 07/24/2024 0217   K 3.8 07/24/2024 0217   CL 105 07/24/2024 0217   CO2 20 (L) 07/24/2024 0217   GLUCOSE 93 07/24/2024 0217   BUN 15 07/24/2024 0217   CREATININE 0.88 07/24/2024 0217   CALCIUM  8.2 (L) 07/24/2024 0217   GFRNONAA >60 07/24/2024 0217    IMAGING past 24 hours MR BRAIN WO CONTRAST Result Date: 07/24/2024 CLINICAL DATA:  Provided history: Stroke, follow-up. EXAM: MRI HEAD WITHOUT CONTRAST TECHNIQUE: Multiplanar, multiecho pulse sequences of the brain and surrounding structures were obtained without intravenous contrast. COMPARISON:  Non-contrast head CT and CT angiogram head/neck 07/23/2024. FINDINGS: Intermittently motion degraded examination. Most notably, the axial T1 sequence is moderate to severely motion degraded and the coronal T2 sequence is severely motion degraded. Within this limitation, findings are swallows. Brain: Generalized cerebral atrophy. Large acute left MCA territory infarct affecting the posterior insula, posterior frontal lobe, parietal lobe and occipital lobe. This infarct does not appear significantly changed  in extent as compared to yesterday's head CT. No significant mass effect at this time. No evidence of hemorrhagic conversion. Tiny acute cortical infarct within the perirolandic right frontoparietal region. Tiny acute infarct within the right parietal white matter (series 2, image 36). Mild multifocal T2 FLAIR hyperintense signal abnormality elsewhere within the cerebral white matter and pons, nonspecific but compatible chronic small ischemic disease. Redemonstrated chronic microhemorrhage within the left occipital lobe. Tiny acute infarcts within the bilateral cerebellar hemispheres. probable small (2-3 mm) meningioma along the right petrous apex better appreciated on the prior brain MRI of 12/28/2021 (series 6, image 8 of the current exam). No extra-axial fluid collection. No midline shift. Vascular: Please see the CTA head/neck performed yesterday. Skull and upper cervical spine: No focal race marrow lesion. Sinuses/Orbits: No mass or acute finding within the imaged orbits. Prior bilateral ocular lens replacement. No significant paranasal sinus disease. IMPRESSION: 1. Intermittently motion degraded examination as described. Within this limitation, findings are as follows. 2. Large acute left MCA territory infarct, as described and not significantly changed in extent since yesterday's head CT. No significant mass effect at this time. No evidence of hemorrhagic conversion. 3. Additional tiny acute infarcts within the perirolandic right frontoparietal cortex, right parietal white matter and bilateral cerebellar hemispheres. 4. Background parenchymal atrophy and chronic small vessel ischemic disease. 5. 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 Electronically Signed   By: Rockey Childs D.O.   On: 07/24/2024 13:13    Vitals:   07/23/24 1931  07/23/24 2335 07/24/24 0404 07/24/24 0815  BP: 134/79 (!) 143/84 122/67 139/77  Pulse: 95 (!) 102 94 98   Resp: 18 18 18 12   Temp: 99.2 F (37.3 C) 98.7 F (37.1 C) 98.7 F (37.1 C) 98.6 F (37 C)  TempSrc: Oral Oral Axillary Oral  SpO2: 100% 98% 98% 99%  Weight:      Height:         PHYSICAL EXAM General:  Alert, well-nourished, well-developed patient in no acute distress Psych:  Mood and affect appropriate for situation CV: Regular rate and rhythm on monitor Respiratory:  Regular, unlabored respirations on room air GI: Abdomen soft and nontender   NEURO:  Mental Status: Awake and alert, able to state her name.  She does perseverate repeating the questions that were initially asked, and then will eventually say I do not know.  She is able to complete one-step commands but has difficulty with multistep commands. Cranial Nerves:  II: PERRL.  Neglect to the right visual field and does not blink to threat on the right. III, IV, VI: Has a left gaze preference but does cross midline V: Sensation is intact to light touch and symmetrical to face.  VII: Face is symmetrical resting and smiling VIII: hearing intact to voice. IX, X: Palate elevates symmetrically. Phonation is normal.  KP:Dynloizm shrug 5/5. XII: tongue is midline without fasciculations. Motor: 5/5 strength to all muscle groups tested.  Tone: is normal and bulk is normal Sensation- Intact to light touch bilaterally. Extinction absent to light touch to DSS.   Coordination: FTN intact bilaterally, HKS: no ataxia in BLE.No drift.  Gait- deferred  Most Recent NIH 4     ASSESSMENT/PLAN  [name] is a 83 y.o. female with history of hypertension, DVT recently started on Eliquis  admitted for confusion and garbled speech.    Acute Ischemic Infarct:  left occipitotemporal infarct  Etiology: Embolic Code Stroke CT head- Acute/early subacute nonhemorrhagic left occipitotemporal infarct. There sulcal effacement but no  significant mass effect. Atrophy and small-vessel disease.  Otherwise stable exam. Patchy sclerosis  and thickening of the squamous portion of the left temporal bone. No change since the 04/28/2024 CT, and appears to have been present on a 12/28/2021 MRI brain although not as well depicted. This could represent pagetoid changes of the bone or changes due to fibrous dysplasia, but a bone metastasis is not strictly excluded. CTA head & neck left MCA M3-M4 branch occlusion MRI pending 9/9- Venous duplex - Positive for DVT in the popliteal and peroneal veins. Occlusive thrombus in the small saphenous vein. 2D Echo pending LDL 73 mg percent. HgbA1c 5.2 VTE prophylaxis - SCDs Eliquis  (apixaban ) daily prior to admission, resume eliquis  once hgb stabilizes  Therapy recommendations:  Pending Disposition:  Pending   DVT Started on Eliquis  on 9/9  Anemia Concern for GI bleeding GI consulted- recommend outpatient colonoscopy  No active bleeding at this time. Follow hgb tomorrow and resume eliquis  if stable Hgb 5.6 -> 6.1 -> 8.9 2u RBC  Hypertension Home meds:  Metoprolol   Stable Blood Pressure Goal: BP less than 220/110   Hyperlipidemia LDL Pending, goal < 70 Continue statin at discharge  Dysphagia Patient has post-stroke dysphagia, SLP consulted    Diet   Diet regular Room service appropriate? Yes; Fluid consistency: Thin   Advance diet as tolerated   Hospital day # 1   I have personally obtained history,examined this patient, reviewed notes, independently viewed imaging studies, participated in medical decision making and  plan of care.ROS completed by me personally and pertinent positives fully documented  I have made any additions or clarifications directly to the above note. Agree with note above.  Patient neurological exam appears stable with persistent right hemianopsia and left gaze preference confusion.  Echocardiogram is pending.  Hemoglobin is stable hence resume Eliquis  today for stroke prevention given history of recent DVT.  Patient may also need outpatient prolonged  cardiac monitoring for paroxysmal A-fib after echo results.  Discussed with Dr. Juvenal   I personally spent a total of  35 minutes in the care of the patient today including getting/reviewing separately obtained history, performing a medically appropriate exam/evaluation, counseling and educating, placing orders, referring and communicating with other health care professionals, documenting clinical information in the EHR, independently interpreting results, and coordinating care.        Eather Popp, MD Medical Director [facility] Pager: [phone] 07/24/2024 1:48 PM      To contact Stroke Continuity provider, please refer to [email or web address]. After hours, contact General Neurology

## 2024-07-24 NOTE — Progress Notes (Signed)
 PROGRESS NOTE    Tiffany [name]  FMW:[id number] DOB: [birth year] DOA: 07/22/2024 PCP: Arvid Collar, FNP    Brief Narrative:  No notes on file    Assessment and Plan: Stroke Per stroke order set which I have ordered-obtain echo, MRI brain pending Neurology feel that the stroke is relatively large based on what they see and that patient is not a candidate for any type of antiplatelet blood thinner at this time so we will hold until we can get this worked up -LDL: 73 -HgbA1c: -PT/OT  Recent DVT-provoked by air travel  -repeat duplex  -if unable to tolerate NOAC may need filter  Anemia  -admitter reached out to GI: doesn't appear she is having any significant active GI bleeding (brown stool noted). She has probably anemic for a long time. With her being admitted for an acute stroke,  would not plan to do an endoscopy unless she is having a major GI bleed. Agree that she will need an endoscopic evaluation at some point.  Recommend consulting when the patient is a little further out from her stroke or if she starts having clinically significant bleeding.  -s/p PRBC -resume elquis when ok with neurology and monitor for bleeding  Leukocytosis without source -hold antibiotics for now-- trending down  Previous history SVT -monitor   DVT prophylaxis: SCDs Start: 07/23/24 0800 SCDs Start: 07/23/24 0800    Code Status: Full Code Family Communication:   Disposition Plan:  Level of care: Telemetry Medical Status is: Inpatient     Consultants:  Neurology GI (phone)     Subjective: Having trouble with speech  Objective: Vitals:   07/23/24 1931 07/23/24 2335 07/24/24 0404 07/24/24 0815  BP: 134/79 (!) 143/84 122/67 139/77  Pulse: 95 (!) 102 94 98  Resp: 18 18 18 12   Temp: 99.2 F (37.3 C) 98.7 F (37.1 C) 98.7 F (37.1 C) 98.6 F (37 C)  TempSrc: Oral Oral Axillary Oral  SpO2: 100% 98% 98% 99%  Weight:      Height:        Intake/Output Summary (Last 24 hours)  at 07/24/2024 1214 Last data filed at 07/24/2024 0300 Gross per 24 hour  Intake 710 ml  Output --  Net 710 ml   Filed Weights   07/22/24 2205  Weight: 59 kg    Examination:   General: Appearance:     Overweight female in no acute distress     Lungs:     respirations unlabored  Heart:    Normal heart rate. Normal rhythm. No murmurs, rubs, or gallops.    MS:   All extremities are intact.    Neurologic:   Awake, alert       Data Reviewed: I have personally reviewed following labs and imaging studies  CBC: Recent Labs  Lab 07/22/24 2221 07/22/24 2303 07/23/24 1223 07/24/24 0217  WBC 16.0*  --  15.0* 13.6*  NEUTROABS  --   --  10.5* 8.9*  HGB 5.6* 6.1* 8.9* 8.6*  HCT 19.4* 18.0* 28.3* 26.5*  MCV 76.1*  --  78.8* 76.1*  PLT 597*  --  446* 410*   Basic Metabolic Panel: Recent Labs  Lab 07/22/24 2221 07/22/24 2303 07/24/24 0217  NA 138 138 138  K 3.6 3.6 3.8  CL 105 106 105  CO2 18*  --  20*  GLUCOSE 98 95 93  BUN 15 14 15   CREATININE 0.83 0.80 0.88  CALCIUM  8.7*  --  8.2*   GFR: Estimated Creatinine Clearance: 39.6  mL/min (by C-G formula based on SCr of 0.88 mg/dL). Liver Function Tests: Recent Labs  Lab 07/22/24 2221 07/24/24 0217  AST 25 23  ALT 16 15  ALKPHOS 149* 141*  BILITOT 0.9 1.2  PROT 7.8 6.5  ALBUMIN 2.7* 2.3*   No results for input(s): LIPASE, AMYLASE in the last 168 hours. No results for input(s): AMMONIA in the last 168 hours. Coagulation Profile: Recent Labs  Lab 07/24/24 0217  INR 1.3*   Cardiac Enzymes: No results for input(s): CKTOTAL, CKMB, CKMBINDEX, TROPONINI in the last 168 hours. BNP (last 3 results) No results for input(s): PROBNP in the last 8760 hours. HbA1C: Recent Labs    07/23/24 1223  HGBA1C 5.2   CBG: Recent Labs  Lab 07/23/24 0042  GLUCAP 77   Lipid Profile: Recent Labs    07/24/24 0217  CHOL 127  HDL 45  LDLCALC 73  TRIG 47  CHOLHDL 2.8   Thyroid Function Tests: No  results for input(s): TSH, T4TOTAL, FREET4, T3FREE, THYROIDAB in the last 72 hours. Anemia Panel: Recent Labs    07/23/24 0138  VITAMINB12 1,321*  FOLATE >20.0  FERRITIN 17  TIBC 339  IRON <10*  RETICCTPCT 3.1   Sepsis Labs: No results for input(s): PROCALCITON, LATICACIDVEN in the last 168 hours.  No results found for this or any previous visit (from the past 240 hours).       Radiology Studies: CT ANGIO HEAD NECK W WO CM Result Date: 07/23/2024 CLINICAL DATA:  83 year old female with confusion, abnormal speech. Recently started on Eliquis . Severe anemia. Cytotoxic edema in the posterior left hemisphere on head CT last night. EXAM: CT ANGIOGRAPHY HEAD AND NECK WITH AND WITHOUT CONTRAST TECHNIQUE: Multidetector CT imaging of the head and neck was performed using the standard protocol during bolus administration of intravenous contrast. Multiplanar CT image reconstructions and MIPs were obtained to evaluate the vascular anatomy. Carotid stenosis measurements (when applicable) are obtained utilizing NASCET criteria, using the distal internal carotid diameter as the denominator. RADIATION DOSE REDUCTION: This exam was performed according to the departmental dose-optimization program which includes automated exposure control, adjustment of the mA and/or kV according to patient size and/or use of iterative reconstruction technique. CONTRAST:  OMNIPAQUE  IOHEXOL  350 MG/ML SOLN COMPARISON:  Head CT 2302 hours yesterday. FINDINGS: CT HEAD Brain: Confluent cytotoxic edema posterior left posterior MCA versus MCA/PCA watershed area appears stable and size and extent since last night. No associated mass effect. No hemorrhagic transformation. Stable gray-white matter differentiation throughout the brain. No intracranial mass effect or midline shift. Stable nonspecific ventriculomegaly. Basilar cisterns remain normal. Calvarium and skull base: Intact. No acute osseous abnormality  identified. Paranasal sinuses: Visualized paranasal sinuses and mastoids are stable and well aerated. Orbits: No gaze deviation.  Stable orbit and scalp soft tissues. CTA NECK Skeleton: Levoconvex cervical scoliosis. No acute osseous abnormality identified. Incidental benign appearing right subscapularis intramuscular lipoma (series 5, image 303). Upper chest: Negative. Other neck: Nonvascular neck soft tissue spaces appear negative. Aortic arch: Mild arch tortuosity and calcified plaque. Bovine arch configuration. Right carotid system: Tortuous brachiocephalic artery and right CCA origin without plaque or stenosis. Negative right carotid bifurcation. No stenosis. Left carotid system: Bovine left CCA origin with tortuosity. Mild calcified plaque at the posterior left ICA origin. Mildly tortuous left ICA without stenosis. Vertebral arteries: Tortuous proximal right subclavian artery without plaque or stenosis. Minimal calcified plaque at the right vertebral artery origin without stenosis. Tortuous right vertebral artery is patent to the neck with  no other plaque or stenosis. Proximal left subclavian artery atherosclerosis and tortuosity, less than 50 % stenosis with respect to the distal vessel. Normal left vertebral artery origin. Tortuous left V1 segment. Codominant left vertebral artery is tortuous but patent without stenosis to the skull base. CTA HEAD Posterior circulation: Codominant distal vertebral arteries and vertebrobasilar junction are patent. Distal left V4 calcified plaque with only mild stenosis. Bilateral AICA appear dominant and patent. Patent basilar artery with mild mid basilar irregularity. No significant basilar stenosis. Patent SCA and fetal type bilateral PCA origins. Bilateral PCA branches are tortuous with mild irregularity. No significant PCA stenosis, mild bilateral P2 segment irregularity and stenosis (series 22, image 27 on the right). Anterior circulation: Both ICA siphons are patent.  Left siphon mild ectasia and calcified plaque without stenosis. Normal left posterior communicating artery origin. Similar right siphon ectasia and mild calcified plaque without stenosis. Normal right posterior communicating artery origin. Patent carotid termini. Normal MCA and ACA origins. Diminutive anterior communicating artery. Bilateral ACA branches are within normal limits. Right MCA M1 segment is tortuous, right MCA trifurcation is patent without stenosis. Right MCA branches are within normal limits. Left MCA M1 segment bifurcates early without stenosis. There is a posterior left MCA branch occlusion, appears to be M3 or M4 on series 22, image 37. Other left MCA branches are within normal limits. Venous sinuses: Patent. Anatomic variants: Fetal type PCA origins. Bovine arch configuration. Review of the MIP images confirms the above findings IMPRESSION: 1. Negative for large vessel occlusion. 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 2. 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 No hemorrhagic transformation or intracranial mass effect. 3. Generalized arterial tortuosity in the head and neck with mild for age atherosclerosis. No other significant stenosis. 4.  Aortic Atherosclerosis (ICD10-I70.0). Electronically Signed   By: VEAR Hurst M.D.   On: 07/23/2024 09:30   CT HEAD WO CONTRAST Result Date: 07/22/2024 CLINICAL DATA:  Memory loss and altered mental status. EXAM: CT HEAD WITHOUT CONTRAST TECHNIQUE: Contiguous axial images were obtained from the base of the skull through the vertex without intravenous contrast. RADIATION DOSE REDUCTION: This exam was performed according to the departmental dose-optimization program which includes automated exposure control, adjustment of the mA and/or kV according to patient size and/or use of iterative reconstruction technique. COMPARISON:  Head CT 04/28/2024. FINDINGS: Brain: There is an  acute/early subacute left occipitotemporal nonhemorrhagic infarct involving the posterior temporal lobe from the posterior sylvian cortex, extending contiguously posteriorly to the lateral occipital lobe over an area measuring 7.1 cm AP, 3.0 cm coronal, 3.3 cm in height. There is hyperdensity of the infarcting tissue, loss of sulci and loss of gray-white matter differentiation. The infarct extends from the cortical surface to the ventricular interface, without hemorrhage or downward mass effect or notable effacement of the posterior horn of the left ventricle. There is no further evidence of infarcts. There is mild cerebral atrophy and small-vessel disease and mild-to-moderate atrophic ventriculomegaly which was seen previously. Cerebellum and brainstem are unremarkable. No midline shift. Basal cisterns are clear. Vascular: Despite the findings I do not see a hyperdense vessel. There are calcific plaques both siphons, distal left vertebral artery. Skull: Negative for fractures. There is patchy sclerosis and thickening of the squamous portion of the left temporal bone. In retrospect this was present on the prior CT and it seems to have been present on MRI from 12/28/2021. This could represent pagetoid  changes of the bone or changes due to fibrous dysplasia, but a bone metastasis is not strictly excluded. Rest of the calvarium is unremarkable. Sinuses/Orbits: No acute finding. Old lens replacements. Clear sinuses and mastoids. Other: None. IMPRESSION: 1. 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 There sulcal effacement but no significant mass effect. 2. Atrophy and small-vessel disease.  Otherwise stable exam. 3. Patchy sclerosis and thickening of the squamous portion of the left temporal bone. No change since the 04/28/2024 CT, and appears to have been present on a 12/28/2021 MRI brain although not as well depicted. This could represent pagetoid changes of the bone or changes due to fibrous  dysplasia, but a bone metastasis is not strictly excluded. 4. PRA is attempting to reach the ordering physician for stat notification at the time of signing. Electronically Signed   By: Francis Quam M.D.   On: 07/22/2024 23:32        Scheduled Meds:  pantoprazole  (PROTONIX ) IV  40 mg Intravenous Q12H   Continuous Infusions:   LOS: 1 day    Time spent: 45 minutes spent on chart review, discussion with nursing staff, consultants, updating family and interview/physical exam; more than 50% of that time was spent in counseling and/or coordination of care.    Harlene RAYMOND Bowl, DO [facility] Available via Epic secure chat 7am-7pm After these hours, please refer to coverage provider listed on [email or web address] 07/24/2024, 12:14 PM

## 2024-07-24 NOTE — Evaluation (Signed)
 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 No pertinent surgical history. HPI:  Tiffany [name] is a 83 y.o. female with a PMHx of HTN, recently started on Eliquis  per report, presenting to the ED with garbled speech. Daughter brought her in for what was thought to be confusion and not acting normally. The patient was oriented only to her name on arrival. Labs revealed severe anemia with a Hgb of 6.1. CT head revealed a subacute left temporo-occipital ischemic infarct; received transfusion in the ED; with a PMHx of HTN   Assessment / Plan / Recommendation Clinical Impression  Patient presents with global aphasia impacting verbal and written expression as well as auditory and written comprehension of information. Per discussion with daughter via phone, communication was [facility] and patient was very independent. She will benefit from continued SLP services in the acute care setting to maximize communication abilities as well as from SLP post acute when medically ready for discharge.    SLP Assessment  SLP Recommendation/Assessment: Patient needs continued Speech Language Pathology Services SLP Visit Diagnosis: Aphasia (R47.01)     Assistance Recommended at Discharge  Frequent or constant Supervision/Assistance  Functional Status Assessment Patient has had a recent decline in their functional status and demonstrates the ability to make significant improvements in function in a reasonable and predictable amount of time.  Frequency and Duration min 2x/week  2 weeks      SLP  Evaluation Cognition  Overall Cognitive Status: Impaired/Different from baseline Arousal/Alertness: Awake/alert Orientation Level: Oriented to person;Oriented to place;Disoriented to time;Disoriented to situation Comments: TBD as communication improves, does not appear to have awareness of language deficits however       Comprehension  Auditory Comprehension Overall Auditory Comprehension: Impaired Yes/No Questions: Impaired Basic Biographical Questions: 76-100% accurate Commands: Impaired One Step Basic Commands: 0-24% accurate Visual Recognition/Discrimination Discrimination: Exceptions to [facility] Common Objects: Unable to indentify Reading Comprehension Reading Status: Impaired Word level: Impaired    Expression Expression Primary Mode of Expression: Verbal Verbal Expression Overall Verbal Expression: Impaired Initiation: No impairment Automatic Speech: Social Response Level of Generative/Spontaneous Verbalization: Phrase;Sentence Repetition: Impaired Level of Impairment: Word level Naming: Impairment Responsive: 0-25% accurate Confrontation: Impaired Common Objects: Unable to indentify Convergent: 0-24% accurate Divergent: 0-24% accurate Verbal Errors: Not aware of errors Pragmatics: No impairment Written Expression Dominant Hand: Right   Oral / Motor  Oral Motor/Sensory Function Overall Oral Motor/Sensory Function: Within functional limits Motor Speech Overall Motor Speech: Appears within functional limits for tasks assessed           Rea Pass MA, CCC-SLP  Jarielys Girardot Meryl 07/24/2024, 1:38 PM

## 2024-07-25 ENCOUNTER — Inpatient Hospital Stay (HOSPITAL_COMMUNITY)

## 2024-07-25 DIAGNOSIS — I63412 Cerebral infarction due to embolism of left middle cerebral artery: Secondary | ICD-10-CM

## 2024-07-25 DIAGNOSIS — I82811 Embolism and thrombosis of superficial veins of right lower extremities: Secondary | ICD-10-CM | POA: Diagnosis not present

## 2024-07-25 DIAGNOSIS — R4701 Aphasia: Secondary | ICD-10-CM | POA: Diagnosis not present

## 2024-07-25 DIAGNOSIS — R29704 NIHSS score 4: Secondary | ICD-10-CM | POA: Diagnosis not present

## 2024-07-25 DIAGNOSIS — I739 Peripheral vascular disease, unspecified: Secondary | ICD-10-CM | POA: Diagnosis not present

## 2024-07-25 DIAGNOSIS — I69391 Dysphagia following cerebral infarction: Secondary | ICD-10-CM | POA: Diagnosis not present

## 2024-07-25 DIAGNOSIS — I6340 Cerebral infarction due to embolism of unspecified cerebral artery: Secondary | ICD-10-CM | POA: Diagnosis not present

## 2024-07-25 LAB — BASIC METABOLIC PANEL WITH GFR
Anion gap: 11 (ref 5–15)
Anion gap: 13 (ref 5–15)
BUN: 9 mg/dL (ref 8–23)
BUN: 13 mg/dL (ref 8–23)
CO2: 21 mmol/L — ABNORMAL LOW (ref 22–32)
CO2: 21 mmol/L — ABNORMAL LOW (ref 22–32)
Calcium: 8.6 mg/dL — ABNORMAL LOW (ref 8.9–10.3)
Calcium: 8.8 mg/dL — ABNORMAL LOW (ref 8.9–10.3)
Chloride: 108 mmol/L (ref 98–111)
Chloride: 106 mmol/L (ref 98–111)
Creatinine, Ser: 0.91 mg/dL (ref 0.44–1.00)
Creatinine, Ser: 0.87 mg/dL (ref 0.44–1.00)
GFR, Estimated: >60 mL/min (ref >60)
GFR, Estimated: >60 mL/min (ref >60)
Glucose, Bld: 100 mg/dL — ABNORMAL HIGH (ref 70–99)
Glucose, Bld: 110 mg/dL — ABNORMAL HIGH (ref 70–99)
Potassium: 3.6 mmol/L (ref 3.5–5.1)
Potassium: 3.7 mmol/L (ref 3.5–5.1)
Sodium: 140 mmol/L (ref 135–145)
Sodium: 140 mmol/L (ref 135–145)

## 2024-07-25 LAB — CBC WITH DIFFERENTIAL/PLATELET
Abs Immature Granulocytes: 0.21 K/uL — ABNORMAL HIGH (ref 0.00–0.07)
Basophils Absolute: 0 K/uL (ref 0.0–0.1)
Basophils Relative: 0 %
Eosinophils Absolute: 0.1 K/uL (ref 0.0–0.5)
Eosinophils Relative: 1 %
HCT: 28.6 % — ABNORMAL LOW (ref 36.0–46.0)
Hemoglobin: 9.2 g/dL — ABNORMAL LOW (ref 12.0–15.0)
Immature Granulocytes: 1 %
Lymphocytes Relative: 15 %
Lymphs Abs: 2.2 K/uL (ref 0.7–4.0)
MCH: 24.9 pg — ABNORMAL LOW (ref 26.0–34.0)
MCHC: 32.2 g/dL (ref 30.0–36.0)
MCV: 77.5 fL — ABNORMAL LOW (ref 80.0–100.0)
Monocytes Absolute: 1.8 K/uL — ABNORMAL HIGH (ref 0.1–1.0)
Monocytes Relative: 12 %
Neutro Abs: 10.2 K/uL — ABNORMAL HIGH (ref 1.7–7.7)
Neutrophils Relative %: 71 %
Platelets: 419 K/uL — ABNORMAL HIGH (ref 150–400)
RBC: 3.69 MIL/uL — ABNORMAL LOW (ref 3.87–5.11)
RDW: 17.3 % — ABNORMAL HIGH (ref 11.5–15.5)
WBC: 14.5 K/uL — ABNORMAL HIGH (ref 4.0–10.5)
nRBC: 0.2 % (ref 0.0–0.2)

## 2024-07-25 MED ORDER — METOPROLOL TARTRATE 12.5 MG HALF TABLET
12.5000 mg | ORAL_TABLET | Freq: Every day | ORAL | Status: DC
Start: 2024-07-25 — End: 2024-07-30
  Administered 2024-07-25 – 2024-07-29 (×10): 12.5 mg via ORAL
  Filled 2024-07-25 (×6): qty 1

## 2024-07-25 NOTE — Progress Notes (Signed)
 STROKE TEAM PROGRESS NOTE   INTERIM HISTORY/SUBJECTIVE Patient neurological exam is unchanged.  She continues to have mild aphasia.  No new complaints.  and has persistent right field cut.  Vital signs stable.  Neurological exam unchanged..  Echocardiogram is normal and lower extremity venous Dopplers are pending.  Hemoglobin stable at 9.2.   Eliquis .has been resumed.  OBJECTIVE  CBC    Component Value Date/Time   WBC 14.5 (H) 07/25/2024 0434   RBC 3.69 (L) 07/25/2024 0434   HGB 9.2 (L) 07/25/2024 0434   HCT 28.6 (L) 07/25/2024 0434   PLT 419 (H) 07/25/2024 0434   MCV 77.5 (L) 07/25/2024 0434   MCH 24.9 (L) 07/25/2024 0434   MCHC 32.2 07/25/2024 0434   RDW 17.3 (H) 07/25/2024 0434   LYMPHSABS 2.2 07/25/2024 0434   MONOABS 1.8 (H) 07/25/2024 0434   EOSABS 0.1 07/25/2024 0434   BASOSABS 0.0 07/25/2024 0434    BMET    Component Value Date/Time   NA 140 07/25/2024 0434   K 3.6 07/25/2024 0434   CL 108 07/25/2024 0434   CO2 21 (L) 07/25/2024 0434   GLUCOSE 100 (H) 07/25/2024 0434   BUN 9 07/25/2024 0434   CREATININE 0.91 07/25/2024 0434   CALCIUM  8.6 (L) 07/25/2024 0434   GFRNONAA >60 07/25/2024 0434    IMAGING past 24 hours ECHOCARDIOGRAM COMPLETE Result Date: 07/24/2024    ECHOCARDIOGRAM REPORT   Patient Name:   Tiffany [name] Date of Exam: 07/24/2024 Medical Rec #:  [id number]        Height:       60.0 in Accession #:    [id number]       Weight:       130.0 lb Date of Birth:  1940-12-31         BSA:          1.554 m Patient Age:    83 years         BP:           122/67 mmHg Patient Gender: F                HR:           96 bpm. Exam Location:  Inpatient Procedure: 2D Echo, Cardiac Doppler and Color Doppler (Both Spectral and Color            Flow Doppler were utilized during procedure). Indications:   Abnormal ECG R94.31  History:       Patient has no prior history of Echocardiogram examinations.                Stroke.  Sonographer:   Thea Norlander RCS Referring       [facility] SAMTANI Phys: IMPRESSIONS  1. Left ventricular ejection fraction, by estimation, is 60 to 65%. The left ventricle has normal function. The left ventricle has no regional wall motion abnormalities. Left ventricular diastolic parameters were normal.  2. Right ventricular systolic function is normal. The right ventricular size is normal.  3. The mitral valve is normal in structure. No evidence of mitral valve regurgitation. No evidence of mitral stenosis.  4. The aortic valve is abnormal. There is mild thickening of the aortic valve with a small subcentimeter echodensity at the LV side of the valve. There is mild aortic valve regurgitation. Consider TEE for further evaluation in setting of recent CVA.  5. The inferior vena cava is normal in size with greater than 50% respiratory variability, suggesting right atrial pressure of 3  mmHg. FINDINGS  Left Ventricle: Left ventricular ejection fraction, by estimation, is 60 to 65%. The left ventricle has normal function. The left ventricle has no regional wall motion abnormalities. The left ventricular internal cavity size was normal in size. There is  no left ventricular hypertrophy. Left ventricular diastolic parameters were normal. Right Ventricle: The right ventricular size is normal. No increase in right ventricular wall thickness. Right ventricular systolic function is normal. Left Atrium: Left atrial size was normal in size. Right Atrium: Right atrial size was normal in size. Pericardium: There is no evidence of pericardial effusion. Mitral Valve: The mitral valve is normal in structure. No evidence of mitral valve regurgitation. No evidence of mitral valve stenosis. Tricuspid Valve: The tricuspid valve is normal in structure. Tricuspid valve regurgitation is not demonstrated. No evidence of tricuspid stenosis. Aortic Valve: The aortic valve is abnormal. There is mild thickening of the aortic valve. Aortic valve regurgitation is mild. No aortic stenosis is  present. Aortic valve peak gradient measures 10.4 mmHg. Pulmonic Valve: The pulmonic valve was normal in structure. Pulmonic valve regurgitation is not visualized. No evidence of pulmonic stenosis. Aorta: The aortic root is normal in size and structure. Venous: The inferior vena cava is normal in size with greater than 50% respiratory variability, suggesting right atrial pressure of 3 mmHg. IAS/Shunts: No atrial level shunt detected by color flow Doppler.  LEFT VENTRICLE PLAX 2D LVIDd:         4.20 cm   Diastology LVIDs:         2.50 cm   LV e' medial:    9.46 cm/s LV PW:         0.90 cm   LV E/e' medial:  6.6 LV IVS:        0.90 cm   LV e' lateral:   14.70 cm/s LVOT diam:     2.00 cm   LV E/e' lateral: 4.2 LV SV:         51 LV SV Index:   33 LVOT Area:     3.14 cm  RIGHT VENTRICLE             IVC RV S prime:     13.80 cm/s  IVC diam: 1.40 cm TAPSE (M-mode): 1.9 cm LEFT ATRIUM             Index        RIGHT ATRIUM           Index LA diam:        4.30 cm 2.77 cm/m   RA Area:     10.50 cm LA Vol (A2C):   39.8 ml 25.61 ml/m  RA Volume:   18.63 ml  11.99 ml/m LA Vol (A4C):   26.8 ml 17.24 ml/m LA Biplane Vol: 30.1 ml 19.37 ml/m  AORTIC VALVE AV Area (Vmax): 1.92 cm AV Vmax:        161.00 cm/s AV Peak Grad:   10.4 mmHg LVOT Vmax:      98.50 cm/s LVOT Vmean:     65.200 cm/s LVOT VTI:       0.163 m  AORTA Ao Root diam: 3.00 cm Ao Asc diam:  3.60 cm MITRAL VALVE MV Area (PHT): 5.31 cm    SHUNTS MV Decel Time: 143 msec    Systemic VTI:  0.16 m MV E velocity: 62.40 cm/s  Systemic Diam: 2.00 cm MV A velocity: 96.40 cm/s MV E/A ratio:  0.65 Aditya Sabharwal Electronically signed by Ria Commander Signature Date/Time:  07/24/2024/3:45:09 PM    Final    MR BRAIN WO CONTRAST Result Date: 07/24/2024 CLINICAL DATA:  Provided history: Stroke, follow-up. EXAM: MRI HEAD WITHOUT CONTRAST TECHNIQUE: Multiplanar, multiecho pulse sequences of the brain and surrounding structures were obtained without intravenous contrast.  COMPARISON:  Non-contrast head CT and CT angiogram head/neck 07/23/2024. FINDINGS: Intermittently motion degraded examination. Most notably, the axial T1 sequence is moderate to severely motion degraded and the coronal T2 sequence is severely motion degraded. Within this limitation, findings are swallows. Brain: Generalized cerebral atrophy. Large acute left MCA territory infarct affecting the posterior insula, posterior frontal lobe, parietal lobe and occipital lobe. This infarct does not appear significantly changed in extent as compared to yesterday's head CT. No significant mass effect at this time. No evidence of hemorrhagic conversion. Tiny acute cortical infarct within the perirolandic right frontoparietal region. Tiny acute infarct within the right parietal white matter (series 2, image 36). Mild multifocal T2 FLAIR hyperintense signal abnormality elsewhere within the cerebral white matter and pons, nonspecific but compatible chronic small ischemic disease. Redemonstrated chronic microhemorrhage within the left occipital lobe. Tiny acute infarcts within the bilateral cerebellar hemispheres. probable small (2-3 mm) meningioma along the right petrous apex better appreciated on the prior brain MRI of 12/28/2021 (series 6, image 8 of the current exam). No extra-axial fluid collection. No midline shift. Vascular: Please see the CTA head/neck performed yesterday. Skull and upper cervical spine: No focal race marrow lesion. Sinuses/Orbits: No mass or acute finding within the imaged orbits. Prior bilateral ocular lens replacement. No significant paranasal sinus disease. IMPRESSION: 1. Intermittently motion degraded examination as described. Within this limitation, findings are as follows. 2. Large acute left MCA territory infarct, as described and not significantly changed in extent since yesterday's head CT. No significant mass effect at this time. No evidence of hemorrhagic conversion. 3. Additional tiny acute  infarcts within the perirolandic right frontoparietal cortex, right parietal white matter and bilateral cerebellar hemispheres. 4. Background parenchymal atrophy and chronic small vessel ischemic disease. 5. 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 Electronically Signed   By: Rockey Childs D.O.   On: 07/24/2024 13:13    Vitals:   07/24/24 0815 07/24/24 1938 07/24/24 2313 07/25/24 0316  BP: 139/77 130/72 (!) 146/85 138/78  Pulse: 98 (!) 108 (!) 103 97  Resp: 12 18 16 17   Temp: 98.6 F (37 C) 99.5 F (37.5 C) 99.2 F (37.3 C) 98 F (36.7 C)  TempSrc: Oral Oral Oral Oral  SpO2: 99% 98% 99% 98%  Weight:      Height:         PHYSICAL EXAM General:  Alert, well-nourished, well-developed patient in no acute distress Psych:  Mood and affect appropriate for situation CV: Regular rate and rhythm on monitor Respiratory:  Regular, unlabored respirations on room air GI: Abdomen soft and nontender   NEURO:  Mental Status: Awake and alert, able to state her name.  She does perseverate repeating the questions that were initially asked, and then will eventually say I do not know.  She is able to complete one-step commands but has difficulty with multistep commands. Cranial Nerves:  II: PERRL.  Neglect to the right visual field and does not blink to threat on the right. III, IV, VI: Has a left gaze preference but does cross midline V: Sensation is intact to light touch and symmetrical to face.  VII: Face is symmetrical resting and smiling VIII: hearing  intact to voice. IX, X: Palate elevates symmetrically. Phonation is normal.  KP:Dynloizm shrug 5/5. XII: tongue is midline without fasciculations. Motor: 5/5 strength to all muscle groups tested.  Tone: is normal and bulk is normal Sensation- Intact to light touch bilaterally. Extinction absent to light touch to DSS.   Coordination: FTN intact bilaterally, HKS: no ataxia in  BLE.No drift.  Gait- deferred  Most Recent NIH 4     ASSESSMENT/PLAN  [name] is a 83 y.o. female with history of hypertension, DVT recently started on Eliquis  admitted for confusion and garbled speech.    Acute Ischemic Infarct:  left occipitotemporal infarct  Etiology: Embolic Code Stroke CT head- Acute/early subacute nonhemorrhagic left occipitotemporal infarct. There sulcal effacement but no  significant mass effect. Atrophy and small-vessel disease.  Otherwise stable exam. Patchy sclerosis and thickening of the squamous portion of the left temporal bone. No change since the 04/28/2024 CT, and appears to have been present on a 12/28/2021 MRI brain although not as well depicted. This could represent pagetoid changes of the bone or changes due to fibrous dysplasia, but a bone metastasis is not strictly excluded. CTA head & neck left MCA M3-M4 branch occlusion MRI pending 9/9- Venous duplex - Positive for DVT in the popliteal and peroneal veins. Occlusive thrombus in the small saphenous vein. 2D Echo pending LDL 73 mg percent. HgbA1c 5.2 VTE prophylaxis - SCDs Eliquis  (apixaban ) daily prior to admission, resume eliquis  once hgb stabilizes  Therapy recommendations:  Pending Disposition:  Pending   DVT Started on Eliquis  on 9/9  Anemia Concern for GI bleeding GI consulted- recommend outpatient colonoscopy  No active bleeding at this time. Follow hgb tomorrow and resume eliquis  if stable Hgb 5.6 -> 6.1 -> 8.9 2u RBC  Hypertension Home meds:  Metoprolol   Stable Blood Pressure Goal: BP less than 220/110   Hyperlipidemia LDL Pending, goal < 70 Continue statin at discharge  Dysphagia Patient has post-stroke dysphagia, SLP consulted    Diet   Diet regular Room service appropriate? Yes; Fluid consistency: Thin   Advance diet as tolerated   Hospital day # 2      Patient neurological exam appears stable with persistent right hemianopsia and left gaze  preference confusion.  Echocardiogram shows normal ejection fraction and left atrial size.  Lower extremity venous Dopplers are pending.  Hemoglobin is stable hence continue Eliquis  given history of recent DVT.  Patient may also need outpatient prolonged cardiac monitoring for paroxysmal A-fib as an outpatient..  Discussed with patient and daughter and answered questions.  Follow-up in outpatient stroke clinic in 2 months with nurse practitioner.  Stroke team will sign off.  I personally spent a total of  35 minutes in the care of the patient today including getting/reviewing separately obtained history, performing a medically appropriate exam/evaluation, counseling and educating, placing orders, referring and communicating with other health care professionals, documenting clinical information in the EHR, independently interpreting results, and coordinating care.        Eather Popp, MD Medical Director [facility] Pager: [phone] 07/25/2024 12:02 PM      To contact Stroke Continuity provider, please refer to [email or web address]. After hours, contact General Neurology

## 2024-07-25 NOTE — Plan of Care (Signed)
  Problem: Education: Goal: Knowledge of disease or condition will improve Outcome: Progressing   Problem: Coping: Goal: Will verbalize positive feelings about self Outcome: Progressing   Problem: Self-Care: Goal: Ability to communicate needs accurately will improve Outcome: Progressing   Problem: Clinical Measurements: Goal: Ability to maintain clinical measurements within normal limits will improve Outcome: Progressing   Problem: Safety: Goal: Ability to remain free from injury will improve Outcome: Progressing

## 2024-07-25 NOTE — Progress Notes (Addendum)
 PROGRESS NOTE    Tiffany [name]  FMW:[id number] DOB: 12-01-1940 DOA: 07/22/2024 PCP: Benjamine Aland, MD    Brief Narrative:  Tiffany [name] is a 83 y.o. female with a PMHx of HTN, recently started on Eliquis  per report, presenting to the ED with garbled speech. Daughter brought her in for what was thought to be confusion and not acting normally. The patient was oriented only to her name on arrival. Labs revealed severe anemia with a Hgb of 6.1. CT head revealed a subacute left temporo-occipital ischemic infarct.  PT recommending CIR    Assessment and Plan: Stroke Per stroke order set which I have ordered-obtain echo, MRI brain pending Neurology: resume eliquis -- Patient may also need outpatient prolonged cardiac monitoring for paroxysmal A-fib as an outpatient..  -LDL: 73 -HgbA1c: 5.2 -PT/OT- CIR  Recent DVT-provoked by air travel  -repeat duplex- similar to prior  -if unable to tolerate NOAC may need filter  Anemia  -admitter reached out to GI: doesn't appear she is having any significant active GI bleeding (brown stool noted). She has probably anemic for a long time. With her being admitted for an acute stroke,  would not plan to do an endoscopy unless she is having a major GI bleed. Agree that she will need an endoscopic evaluation at some point.  Recommend consulting when the patient is a little further out from her stroke or if she starts having clinically significant bleeding.  -s/p PRBC -resume elquis -trend CBC  Leukocytosis without source -trend  Previous history SVT -monitor   DVT prophylaxis: SCDs Start: 07/23/24 0800 SCDs Start: 07/23/24 0800 apixaban  (ELIQUIS ) tablet 5 mg    Code Status: Full Code Family Communication: daughter at bedside  Disposition Plan:  Level of care: Telemetry Medical Status is: Inpatient  CIR?    Consultants:  Neurology GI (phone)     Subjective: Having trouble with speech  Objective: Vitals:   07/24/24 1938 07/24/24  2313 07/25/24 0316 07/25/24 1210  BP: 130/72 (!) 146/85 138/78 131/81  Pulse: (!) 108 (!) 103 97 (!) 102  Resp: 18 16 17 19   Temp: 99.5 F (37.5 C) 99.2 F (37.3 C) 98 F (36.7 C) 98.4 F (36.9 C)  TempSrc: Oral Oral Oral Oral  SpO2: 98% 99% 98% 96%  Weight:      Height:        Intake/Output Summary (Last 24 hours) at 07/25/2024 1253 Last data filed at 07/24/2024 1956 Gross per 24 hour  Intake 240 ml  Output --  Net 240 ml   Filed Weights   07/22/24 2205  Weight: 59 kg    Examination:   General: Appearance:     Overweight female in no acute distress     Lungs:     respirations unlabored  Heart:    Tachycardic.     MS:   All extremities are intact.    Neurologic:   Awake, alert       Data Reviewed: I have personally reviewed following labs and imaging studies  CBC: Recent Labs  Lab 07/22/24 2221 07/22/24 2303 07/23/24 1223 07/24/24 0217 07/25/24 0434  WBC 16.0*  --  15.0* 13.6* 14.5*  NEUTROABS  --   --  10.5* 8.9* 10.2*  HGB 5.6* 6.1* 8.9* 8.6* 9.2*  HCT 19.4* 18.0* 28.3* 26.5* 28.6*  MCV 76.1*  --  78.8* 76.1* 77.5*  PLT 597*  --  446* 410* 419*   Basic Metabolic Panel: Recent Labs  Lab 07/22/24 2221 07/22/24 2303 07/24/24 0217 07/25/24 0434  NA 138 138 138 140  K 3.6 3.6 3.8 3.6  CL 105 106 105 108  CO2 18*  --  20* 21*  GLUCOSE 98 95 93 100*  BUN 15 14 15 9   CREATININE 0.83 0.80 0.88 0.91  CALCIUM  8.7*  --  8.2* 8.6*   GFR: Estimated Creatinine Clearance: 38.3 mL/min (by C-G formula based on SCr of 0.91 mg/dL). Liver Function Tests: Recent Labs  Lab 07/22/24 2221 07/24/24 0217  AST 25 23  ALT 16 15  ALKPHOS 149* 141*  BILITOT 0.9 1.2  PROT 7.8 6.5  ALBUMIN 2.7* 2.3*   No results for input(s): LIPASE, AMYLASE in the last 168 hours. No results for input(s): AMMONIA in the last 168 hours. Coagulation Profile: Recent Labs  Lab 07/24/24 0217  INR 1.3*   Cardiac Enzymes: No results for input(s): CKTOTAL, CKMB,  CKMBINDEX, TROPONINI in the last 168 hours. BNP (last 3 results) No results for input(s): PROBNP in the last 8760 hours. HbA1C: Recent Labs    07/23/24 1223  HGBA1C 5.2   CBG: Recent Labs  Lab 07/23/24 0042  GLUCAP 77   Lipid Profile: Recent Labs    07/24/24 0217  CHOL 127  HDL 45  LDLCALC 73  TRIG 47  CHOLHDL 2.8   Thyroid Function Tests: No results for input(s): TSH, T4TOTAL, FREET4, T3FREE, THYROIDAB in the last 72 hours. Anemia Panel: Recent Labs    07/23/24 0138  VITAMINB12 1,321*  FOLATE >20.0  FERRITIN 17  TIBC 339  IRON <10*  RETICCTPCT 3.1   Sepsis Labs: No results for input(s): PROCALCITON, LATICACIDVEN in the last 168 hours.  No results found for this or any previous visit (from the past 240 hours).       Radiology Studies: VAS US  LOWER EXTREMITY VENOUS (DVT) Result Date: 07/25/2024  Lower Venous DVT Study Patient Name:  Tiffany [name]  Date of Exam:   07/25/2024 Medical Rec #: [id number]         Accession #:    [id number] Date of Birth: Mar 26, 1941          Patient Gender: F Patient Age:   87 years Exam Location:  [facility] Procedure:      VAS US  LOWER EXTREMITY VENOUS (DVT) Referring Phys: HARLENE BOWL --------------------------------------------------------------------------------  Indications: Superficial venous thrombosis (SVT) I82.819.  Risk Factors: None identified. Comparison Study: No prior studies. Performing Technologist: Cordella Collet RVT  Examination Guidelines: A complete evaluation includes B-mode imaging, spectral Doppler, color Doppler, and power Doppler as needed of all accessible portions of each vessel. Bilateral testing is considered an integral part of a complete examination. Limited examinations for reoccurring indications may be performed as noted. The reflux portion of the exam is performed with the patient in reverse Trendelenburg.   +---------+---------------+---------+-----------+----------+--------------+ RIGHT    CompressibilityPhasicitySpontaneityPropertiesThrombus Aging +---------+---------------+---------+-----------+----------+--------------+ CFV      Full           Yes      Yes                                 +---------+---------------+---------+-----------+----------+--------------+ SFJ      Full                                                        +---------+---------------+---------+-----------+----------+--------------+  FV Prox  Full                                                        +---------+---------------+---------+-----------+----------+--------------+ FV Mid   Full                                                        +---------+---------------+---------+-----------+----------+--------------+ FV DistalFull                                                        +---------+---------------+---------+-----------+----------+--------------+ PFV      Full                                                        +---------+---------------+---------+-----------+----------+--------------+ POP      None           No       No                   Acute          +---------+---------------+---------+-----------+----------+--------------+ PTV      Partial                                      Acute          +---------+---------------+---------+-----------+----------+--------------+ PERO     None                                         Acute          +---------+---------------+---------+-----------+----------+--------------+ Gastroc  Full                                                        +---------+---------------+---------+-----------+----------+--------------+   +----+---------------+---------+-----------+----------+--------------+ LEFTCompressibilityPhasicitySpontaneityPropertiesThrombus Aging  +----+---------------+---------+-----------+----------+--------------+ CFV Full           Yes      Yes                                 +----+---------------+---------+-----------+----------+--------------+    Summary: RIGHT: - Findings consistent with acute deep vein thrombosis involving the right popliteal vein, right posterior tibial veins, and right peroneal veins.  - No cystic structure found in the popliteal fossa.  LEFT: - No evidence of common femoral vein obstruction.   *See table(s) above for measurements and observations.    Preliminary    ECHOCARDIOGRAM COMPLETE Result Date: 07/24/2024    ECHOCARDIOGRAM REPORT  Patient Name:   Tiffany [name] Date of Exam: 07/24/2024 Medical Rec #:  [id number]        Height:       60.0 in Accession #:    [id number]       Weight:       130.0 lb Date of Birth:  [birth year]         BSA:          1.554 m Patient Age:    83 years         BP:           122/67 mmHg Patient Gender: F                HR:           96 bpm. Exam Location:  Inpatient Procedure: 2D Echo, Cardiac Doppler and Color Doppler (Both Spectral and Color            Flow Doppler were utilized during procedure). Indications:   Abnormal ECG R94.31  History:       Patient has no prior history of Echocardiogram examinations.                Stroke.  Sonographer:   Thea Norlander RCS Referring      [facility] SAMTANI Phys: IMPRESSIONS  1. Left ventricular ejection fraction, by estimation, is 60 to 65%. The left ventricle has normal function. The left ventricle has no regional wall motion abnormalities. Left ventricular diastolic parameters were normal.  2. Right ventricular systolic function is normal. The right ventricular size is normal.  3. The mitral valve is normal in structure. No evidence of mitral valve regurgitation. No evidence of mitral stenosis.  4. The aortic valve is abnormal. There is mild thickening of the aortic valve with a small subcentimeter echodensity at the LV side of the valve. There  is mild aortic valve regurgitation. Consider TEE for further evaluation in setting of recent CVA.  5. The inferior vena cava is normal in size with greater than 50% respiratory variability, suggesting right atrial pressure of 3 mmHg. FINDINGS  Left Ventricle: Left ventricular ejection fraction, by estimation, is 60 to 65%. The left ventricle has normal function. The left ventricle has no regional wall motion abnormalities. The left ventricular internal cavity size was normal in size. There is  no left ventricular hypertrophy. Left ventricular diastolic parameters were normal. Right Ventricle: The right ventricular size is normal. No increase in right ventricular wall thickness. Right ventricular systolic function is normal. Left Atrium: Left atrial size was normal in size. Right Atrium: Right atrial size was normal in size. Pericardium: There is no evidence of pericardial effusion. Mitral Valve: The mitral valve is normal in structure. No evidence of mitral valve regurgitation. No evidence of mitral valve stenosis. Tricuspid Valve: The tricuspid valve is normal in structure. Tricuspid valve regurgitation is not demonstrated. No evidence of tricuspid stenosis. Aortic Valve: The aortic valve is abnormal. There is mild thickening of the aortic valve. Aortic valve regurgitation is mild. No aortic stenosis is present. Aortic valve peak gradient measures 10.4 mmHg. Pulmonic Valve: The pulmonic valve was normal in structure. Pulmonic valve regurgitation is not visualized. No evidence of pulmonic stenosis. Aorta: The aortic root is normal in size and structure. Venous: The inferior vena cava is normal in size with greater than 50% respiratory variability, suggesting right atrial pressure of 3 mmHg. IAS/Shunts: No atrial level shunt detected by color flow Doppler.  LEFT VENTRICLE PLAX 2D LVIDd:  4.20 cm   Diastology LVIDs:         2.50 cm   LV e' medial:    9.46 cm/s LV PW:         0.90 cm   LV E/e' medial:  6.6 LV  IVS:        0.90 cm   LV e' lateral:   14.70 cm/s LVOT diam:     2.00 cm   LV E/e' lateral: 4.2 LV SV:         51 LV SV Index:   33 LVOT Area:     3.14 cm  RIGHT VENTRICLE             IVC RV S prime:     13.80 cm/s  IVC diam: 1.40 cm TAPSE (M-mode): 1.9 cm LEFT ATRIUM             Index        RIGHT ATRIUM           Index LA diam:        4.30 cm 2.77 cm/m   RA Area:     10.50 cm LA Vol (A2C):   39.8 ml 25.61 ml/m  RA Volume:   18.63 ml  11.99 ml/m LA Vol (A4C):   26.8 ml 17.24 ml/m LA Biplane Vol: 30.1 ml 19.37 ml/m  AORTIC VALVE AV Area (Vmax): 1.92 cm AV Vmax:        161.00 cm/s AV Peak Grad:   10.4 mmHg LVOT Vmax:      98.50 cm/s LVOT Vmean:     65.200 cm/s LVOT VTI:       0.163 m  AORTA Ao Root diam: 3.00 cm Ao Asc diam:  3.60 cm MITRAL VALVE MV Area (PHT): 5.31 cm    SHUNTS MV Decel Time: 143 msec    Systemic VTI:  0.16 m MV E velocity: 62.40 cm/s  Systemic Diam: 2.00 cm MV A velocity: 96.40 cm/s MV E/A ratio:  0.65 Aditya Sabharwal Electronically signed by Ria Commander Signature Date/Time: 07/24/2024/3:45:09 PM    Final    MR BRAIN WO CONTRAST Result Date: 07/24/2024 CLINICAL DATA:  Provided history: Stroke, follow-up. EXAM: MRI HEAD WITHOUT CONTRAST TECHNIQUE: Multiplanar, multiecho pulse sequences of the brain and surrounding structures were obtained without intravenous contrast. COMPARISON:  Non-contrast head CT and CT angiogram head/neck 07/23/2024. FINDINGS: Intermittently motion degraded examination. Most notably, the axial T1 sequence is moderate to severely motion degraded and the coronal T2 sequence is severely motion degraded. Within this limitation, findings are swallows. Brain: Generalized cerebral atrophy. Large acute left MCA territory infarct affecting the posterior insula, posterior frontal lobe, parietal lobe and occipital lobe. This infarct does not appear significantly changed in extent as compared to yesterday's head CT. No significant mass effect at this time. No evidence of  hemorrhagic conversion. Tiny acute cortical infarct within the perirolandic right frontoparietal region. Tiny acute infarct within the right parietal white matter (series 2, image 36). Mild multifocal T2 FLAIR hyperintense signal abnormality elsewhere within the cerebral white matter and pons, nonspecific but compatible chronic small ischemic disease. Redemonstrated chronic microhemorrhage within the left occipital lobe. Tiny acute infarcts within the bilateral cerebellar hemispheres. probable small (2-3 mm) meningioma along the right petrous apex better appreciated on the prior brain MRI of 12/28/2021 (series 6, image 8 of the current exam). No extra-axial fluid collection. No midline shift. Vascular: Please see the CTA head/neck performed yesterday. Skull and upper cervical spine: No focal race marrow lesion.  Sinuses/Orbits: No mass or acute finding within the imaged orbits. Prior bilateral ocular lens replacement. No significant paranasal sinus disease. IMPRESSION: 1. Intermittently motion degraded examination as described. Within this limitation, findings are as follows. 2. Large acute left MCA territory infarct, as described and not significantly changed in extent since yesterday's head CT. No significant mass effect at this time. No evidence of hemorrhagic conversion. 3. Additional tiny acute infarcts within the perirolandic right frontoparietal cortex, right parietal white matter and bilateral cerebellar hemispheres. 4. Background parenchymal atrophy and chronic small vessel ischemic disease. 5. 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 Electronically Signed   By: Rockey Childs D.O.   On: 07/24/2024 13:13        Scheduled Meds:  apixaban   5 mg Oral BID   dorzolamide   1 drop Both Eyes TID   latanoprost   1 drop Both Eyes QHS   mirtazapine   7.5 mg Oral QHS   pantoprazole  (PROTONIX ) IV  40 mg Intravenous Q12H   predniSONE   5 mg Oral  Daily   Continuous Infusions:   LOS: 2 days    Time spent: 45 minutes spent on chart review, discussion with nursing staff, consultants, updating family and interview/physical exam; more than 50% of that time was spent in counseling and/or coordination of care.    Harlene RAYMOND Bowl, DO [facility] Available via Epic secure chat 7am-7pm After these hours, please refer to coverage provider listed on [email or web address] 07/25/2024, 12:53 PM

## 2024-07-25 NOTE — Plan of Care (Signed)
  Problem: Ischemic Stroke/TIA Tissue Perfusion: Goal: Complications of ischemic stroke/TIA will be minimized Outcome: Progressing   Problem: Health Behavior/Discharge Planning: Goal: Ability to manage health-related needs will improve Outcome: Progressing Goal: Goals will be collaboratively established with patient/family Outcome: Progressing   Problem: Self-Care: Goal: Ability to participate in self-care as condition permits will improve Outcome: Progressing

## 2024-07-25 NOTE — TOC Initial Note (Signed)
 Transition of Care [facility]) - Initial/Assessment Note    Patient Details  Name: Tiffany [name] MRN: [id number] Date of Birth: 02-Oct-1941  [facility] CM/SW Contact:    [name], RN Phone Number: 07/25/2024, 1:42 PM  Clinical Narrative:                  83 year old female -originally from [place] hypertension, dyslipidemia, mild carotid artery stenosis, and mild mitral regurgitation.   Pt is from home with her daughter. Daughter works during the daytime.  Pt has been IADL. She does drive or uses Pharmacist, community.  She manages her own medications and daughter denies issues.   Current recommendations are for CIR. Awaiting work up  [employer] care management following.  Expected Discharge Plan: IP Rehab Facility Barriers to Discharge: Continued Medical Work up   Patient Goals and CMS Choice   CMS Medicare.gov Compare Post Acute Care list provided to:: Patient Choice offered to / list presented to : Patient, Adult Children      Expected Discharge Plan and Services   Discharge Planning Services: CM Consult Post Acute Care Choice: IP Rehab Living arrangements for the past 2 months: Single Family Home                                      Prior Living Arrangements/Services Living arrangements for the past 2 months: Single Family Home Lives with:: Adult Children Patient language and need for interpreter reviewed:: Yes (Creole) Do you feel safe going back to the place where you live?: Yes        Care giver support system in place?: Yes (comment)   Criminal Activity/Legal Involvement Pertinent to Current Situation/Hospitalization: No - Comment as needed  Activities of Daily Living   ADL Screening (condition at time of admission) Independently performs ADLs?: No Does the patient have a NEW difficulty with bathing/dressing/toileting/self-feeding that is expected to last >3 days?: Yes (Initiates electronic notice to provider for possible OT consult) Does the patient  have a NEW difficulty with getting in/out of bed, walking, or climbing stairs that is expected to last >3 days?: Yes (Initiates electronic notice to provider for possible PT consult) Does the patient have a NEW difficulty with communication that is expected to last >3 days?: Yes (Initiates electronic notice to provider for possible SLP consult) Is the patient deaf or have difficulty hearing?: No Does the patient have difficulty seeing, even when wearing glasses/contacts?: No Does the patient have difficulty concentrating, remembering, or making decisions?: Yes  Permission Sought/Granted                  Emotional Assessment Appearance:: Appears stated age   Affect (typically observed): Quiet Orientation: : Oriented to Self   Psych Involvement: No (comment)  Admission diagnosis:  Aphasia [R47.01] Stroke (cerebrum) (HCC) [I63.9] Cerebrovascular accident (CVA), unspecified mechanism (HCC) [I63.9] Anemia, unspecified type [D64.9] Patient Active Problem List   Diagnosis Date Noted   Aphasia 07/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

## 2024-07-25 NOTE — PMR Pre-admission (Signed)
 PMR Admission Coordinator Pre-Admission Assessment  Patient: Tiffany [name] is an 83 y.o., female MRN: [id number] DOB: [birth year] Height: 5' (152.4 cm) Weight: 59 kg              Insurance Information HMO:     PPO: yes     PCP:      IPA:      80/20:      OTHER:  PRIMARY: [employer] Choice      Policy#: [id number]      Subscriber: patient CM Name: Powell      Phone#: not provided     Fax#: [phone] Pre-Cert#: [id number] auth for CIR from [place] with [facility] Expedited Appeals dept for admit 9/29/ with next review date 10/6.  Updates due to fax listed above.        Employer:  Benefits:  Phone #: online at [email or web address]     Name:  Eff. Date: 05/03/23-still active     Deduct: $350 ($0 met)      Out of Pocket Max: $9,350 ($299.12 met)      Life Max: NA  CIR: $475/day co-pay with a max co-pay of $2,375/admission      SNF: 100% coverage for days 1-20, $214/day co-pay for days 21-100 Outpatient:     Co-Pay:  Home Health: 100% coverage      Co-Pay:  DME: 88% coverage     Co-Pay: 12% co-insurance Providers: in-network SECONDARY:       Policy#:       Phone#:   Artist:       Phone#:   The Data processing manager" for patients in Inpatient Rehabilitation Facilities with attached "Privacy Act Statement-Health Care Records" was provided and verbally reviewed with: Family  Emergency Contact Information Contact Information     Name Relation Home Work Mobile   [name],[name] Daughter   [phone]      Other Contacts   None on File    Current Medical History  Patient Admitting Diagnosis: CVA History of Present Illness: Pt is an 83 year old female with medical hx significant for: HTN, DVT. Pt presented to [facility] on 07/22/24 d/t garbled speech and confusion. Labs revealed Hgb of 6.1. Pt received 2 units PRBC. CT head showed subacute left temporo-occipital ischemic infarct. 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 Therapy  evaluations completed and CIR recommended d/t pt's deficits in functional mobility. Complete NIHSS TOTAL: 3 Glasgow Coma Scale Score: 14  Patient's medical record from [facility] has been reviewed by the rehabilitation admission coordinator and physician.  Past Medical History  Past Medical History:  Diagnosis Date   Hypertension     Has the patient had major surgery during 100 days prior to admission? No  Family History  family history is not on file.   Current Medications   Current Facility-Administered Medications:    apixaban  (ELIQUIS ) tablet 5 mg, 5 mg, Oral, BID, Vann, Jessica U, DO, 5 mg at 07/25/24 0830   dorzolamide  (TRUSOPT ) 2 % ophthalmic solution 1 drop, 1 drop, Both Eyes, TID, Vann, Jessica U, DO, 1 drop at 07/25/24 0830   latanoprost  (XALATAN ) 0.005 % ophthalmic solution 1 drop, 1 drop, Both Eyes, QHS, Vann, Jessica U, DO, 1 drop at 07/24/24 2112   metoprolol  tartrate (LOPRESSOR ) tablet 12.5 mg, 12.5 mg, Oral, Daily, Vann, Jessica U, DO   mirtazapine  (REMERON ) tablet 7.5 mg, 7.5 mg, Oral, QHS, Vann, Jessica U, DO, 7.5 mg at 07/24/24 2111   pantoprazole  (PROTONIX ) injection 40  mg, 40 mg, Intravenous, Q12H, Samtani, Jai-Gurmukh, MD, 40 mg at 07/25/24 0830   predniSONE  (DELTASONE ) tablet 5 mg, 5 mg, Oral, Daily, Vann, Jessica U, DO, 5 mg at 07/25/24 0830  Patients Current Diet:  Diet Order             Diet regular Room service appropriate? Yes; Fluid consistency: Thin  Diet effective now                   Precautions / Restrictions Precautions Precautions: Fall Precaution/Restrictions Comments: R visual field cut Restrictions Weight Bearing Restrictions Per Provider Order: No   Has the patient had 2 or more falls or a fall with injury in the past year?No  Prior Activity Level Limited Community (1-2x/wk): gets out of house ~2-3 days/week  Prior Functional Level Prior Function Prior Level of Function : Independent/Modified Independent,  Driving Mobility Comments: pt flew from [facility] by herself 2 weeks ago PTA ADLs Comments: Pt enjoys sewing; has a pillbox but does not use it  Self Care: Did the patient need help bathing, dressing, using the toilet or eating?  Independent  Indoor Mobility: Did the patient need assistance with walking from room to room (with or without device)? Independent  Stairs: Did the patient need assistance with internal or external stairs (with or without device)? Independent  Functional Cognition: Did the patient need help planning regular tasks such as shopping or remembering to take medications? Independent  Patient Information    Patient's Response To:     Home Assistive Devices / Equipment Home Equipment: None  Prior Device Use: Indicate devices/aids used by the patient prior to current illness, exacerbation or injury? None of the above  Current Functional Level Cognition  Arousal/Alertness: Awake/alert Overall Cognitive Status: Impaired/Different from baseline Orientation Level: Oriented to person, Disoriented to place, Disoriented to time, Disoriented to situation Comments: TBD as communication improves, does not appear to have awareness of language deficits however    Extremity Assessment (includes Sensation/Coordination)  Upper Extremity Assessment: Generalized weakness (proximal limitations in strength at B shoulders (with + pain reaction), daughter reporting no known hx of arthritis or shoulder issues PTA)  Lower Extremity Assessment: Difficult to assess due to impaired cognition (Able to activate quadriceps and extend knees with extensive cueing; grossly equal strength bilaterally, but unable to test functional strength with stnding)    ADLs  Overall ADL's : Needs assistance/impaired Grooming: Minimal assistance, Wash/dry hands, Wash/dry face, Oral care, Moderate assistance, Cueing for safety, Cueing for sequencing, Standing Grooming Details (indicate cue type and reason): pt  using grooming items improperly (used mouthwash to wipe face), needs cues for proper sequencing and identification of self-care items (especially items located to the R side) Toilet Transfer: Minimal assistance, Cueing for safety, Ambulation, Regular Toilet Toilet Transfer Details (indicate cue type and reason): cues for safe approach to toilet and to control descent/use of grab bars Toileting- Clothing Manipulation and Hygiene: Minimal assistance, Cueing for safety, Sit to/from stand Toileting - Clothing Manipulation Details (indicate cue type and reason): prompted to complete clothing mgmt down, performed anterior peri care seated Functional mobility during ADLs: Minimal assistance, Cueing for safety General ADL Comments: Pt bumped into doorframe on the R side upon return to room likely 2/2 visual impairment.    Mobility  Overal bed mobility: Needs Assistance Bed Mobility: Supine to Sit, Sit to Supine Supine to sit: Supervision Sit to supine: Supervision, HOB elevated General bed mobility comments: Cues to initiate return to supine at end of session  Transfers  Overall transfer level: Needs assistance Equipment used: None Transfers: Sit to/from Stand, Bed to chair/wheelchair/BSC Sit to Stand: Contact guard assist Bed to/from chair/wheelchair/BSC transfer type:: Step pivot Step pivot transfers: Min assist General transfer comment: Inc assist to stand from toilet 2/2 lower surface height.    Ambulation / Gait / Stairs / Wheelchair Mobility  Ambulation/Gait Ambulation/Gait assistance: Mod assist, Min assist Gait Distance (Feet): 100 Feet Assistive device: None Gait Pattern/deviations: Step-through pattern, Decreased stride length General Gait Details: MinA over level surface negotiation, up to modA with obstacles or in tight spaces. Right lateral lean and drift, running into right side of doorway without awareness. Gait velocity: decreased    Posture / Balance Dynamic Sitting  Balance Sitting balance - Comments: no overt LOB observed Balance Overall balance assessment: Needs assistance Sitting-balance support: No upper extremity supported, Feet supported Sitting balance-Leahy Scale: Good Sitting balance - Comments: no overt LOB observed Standing balance support: No upper extremity supported, During functional activity Standing balance-Leahy Scale: Poor Standing balance comment: pt often reaching for walls/furniture to steady self when walking to sink, cues for remaining upright and to prevent LOB    Special considerations/ Life events NA     Previous Home Environment (from acute therapy documentation) Living Arrangements: Children  Lives With: Daughter Available Help at Discharge: Family Type of Home: House Home Layout: Able to live on main level with bedroom/bathroom Home Access: Stairs to enter Entrance Stairs-Rails: None (wall on one side) Entrance Stairs-Number of Steps: 5 STE Bathroom Shower/Tub: Health visitor: Standard Home Care Services: No Additional Comments: Pt's daughter works Museum/gallery exhibitions officer; picks up weekday hours occasionally. Pt SIL is home Saturday and Sunday. Pt sister who lives in [place] is willing to come and help out.  Discharge Living Setting Plans for Discharge Living Setting: Patient's home Type of Home at Discharge: House Discharge Home Layout: Two level, Able to live on main level with bedroom/bathroom Discharge Home Access: Stairs to enter Entrance Stairs-Rails: None (wall on left side) Entrance Stairs-Number of Steps: 5 Discharge Bathroom Shower/Tub: Walk-in shower Discharge Bathroom Toilet: Standard Does the patient have any problems obtaining your medications?: No  Social/Family/Support Systems Anticipated Caregiver: Oneta Fireman (daughter) and other family Anticipated Caregiver's Contact Information: [phone] Caregiver Availability: 24/7 Discharge Plan Discussed with Primary Caregiver: Yes Is Caregiver In  Agreement with Plan?: Yes Does Caregiver/Family have Issues with Lodging/Transportation while Pt is in Rehab?: No   Goals Patient/Family Goal for Rehab: Supervision: PT/OT, Supervision-Min A:ST Expected length of stay: 7-9 days Pt/Family Agrees to Admission and willing to participate: Yes Program Orientation Provided & Reviewed with Pt/Caregiver Including Roles  & Responsibilities: Yes   Decrease burden of Care through IP rehab admission: NA   Possible need for SNF placement upon discharge:Not anticipated   Patient Condition: This patient's medical and functional status has changed since the consult dated: 9/23 in which the Rehabilitation Physician determined and documented that the patient's condition is appropriate for intensive rehabilitative care in an inpatient rehabilitation facility. See History of Present Illness (above) for medical update. Functional changes are: pt min to mod assist for mobility without AD. Patient's medical and functional status update has been discussed with the Rehabilitation physician and patient remains appropriate for inpatient rehabilitation. Will admit to inpatient rehab today.  Preadmission Screen Completed By:  Tinnie SHAUNNA Yvone Delayne, CCC-SLP, updates by Reche Lowers, PT, DPT 07/25/2024 2:43 PM ______________________________________________________________________   Discussed status with Dr. Babs on 07/31/24 at  11:04 AM  and received approval for  admission today.  Admission Coordinator:  Tinnie SHAUNNA Yvone Delayne, time 11:04 AM /Date09/29/25

## 2024-07-25 NOTE — Evaluation (Signed)
 Occupational Therapy Evaluation Patient Details Name: Tiffany [name] MRN: [id number] DOB: Jul 11, 1941 Today's Date: 07/25/2024   History of Present Illness   Tiffany [name] is a 83 y.o. female with a PMHx of HTN, recently started on Eliquis  per report, presenting to the ED with garbled speech. Daughter brought her in for what was thought to be confusion and not acting normally. The patient was oriented only to her name on arrival. Labs revealed severe anemia with a Hgb of 6.1. CT head revealed a subacute left temporo-occipital ischemic infarct; received transfusion in the ED; with a PMHx of HTN, recently started on Eliquis  per report     Clinical Impressions Pt admitted for the above, seen today for OT evaluation. Benefits from encouragement to participate, supportive daughter present throughout eval. PTA, pt was fully independent with ADLs, medication mgmt, and long-distance travel. Lives with her daughter. She presented today with cog/comm deficits which limited formal visual assessment. Globally aphasic; grossly intact BUE strength/ROM. She demonstrated R visual field and peripheral impairment, limiting functional performance. She required up to mod A for grooming at the sink, and min A for toileting. CGA-min A for functional mobility without AD due to balance & visual deficits.   Pt is currently functioning below baseline and would benefit from ongoing acute OT services to progress towards safe discharge and to facilitate return to prior level of function. Current recommendation is high-intensity post-acute rehab (>3 hours/day) upon discharge.      If plan is discharge home, recommend the following:   A little help with walking and/or transfers;A lot of help with bathing/dressing/bathroom;Assistance with cooking/housework;Assistance with feeding;Direct supervision/assist for medications management;Direct supervision/assist for financial management;Assist for transportation;Help with stairs  or ramp for entrance;Supervision due to cognitive status     Functional Status Assessment   Patient has had a recent decline in their functional status and demonstrates the ability to make significant improvements in function in a reasonable and predictable amount of time.     Equipment Recommendations   Other (comment) (defer to next level of care)     Recommendations for Other Services   Rehab consult     Precautions/Restrictions   Precautions Precautions: Fall Recall of Precautions/Restrictions: Impaired Precaution/Restrictions Comments: R visual field cut Restrictions Weight Bearing Restrictions Per Provider Order: No     Mobility Bed Mobility Overal bed mobility: Needs Assistance Bed Mobility: Supine to Sit, Sit to Supine     Supine to sit: Supervision Sit to supine: Supervision, HOB elevated   General bed mobility comments: Cues to initiate return to supine at end of session    Transfers Overall transfer level: Needs assistance Equipment used: None Transfers: Sit to/from Stand, Bed to chair/wheelchair/BSC Sit to Stand: Contact guard assist     Step pivot transfers: Min assist     General transfer comment: Inc assist to stand from toilet 2/2 lower surface height.      Balance Overall balance assessment: Needs assistance Sitting-balance support: No upper extremity supported, Feet supported Sitting balance-Leahy Scale: Good Sitting balance - Comments: no overt LOB observed   Standing balance support: No upper extremity supported, During functional activity Standing balance-Leahy Scale: Poor Standing balance comment: pt often reaching for walls/furniture to steady self when walking to sink, cues for remaining upright and to prevent LOB                           ADL either performed or assessed with clinical judgement   ADL Overall  ADL's : Needs assistance/impaired     Grooming: Minimal assistance;Wash/dry hands;Wash/dry face;Oral  care;Moderate assistance;Cueing for safety;Cueing for sequencing;Standing Grooming Details (indicate cue type and reason): pt using grooming items improperly (used mouthwash to wipe face), needs cues for proper sequencing and identification of self-care items (especially items located to the R side)                 Toilet Transfer: Minimal assistance;Cueing for safety;Ambulation;Regular Teacher, adult education Details (indicate cue type and reason): cues for safe approach to toilet and to control descent/use of grab bars Toileting- Clothing Manipulation and Hygiene: Minimal assistance;Cueing for safety;Sit to/from stand Toileting - Clothing Manipulation Details (indicate cue type and reason): prompted to complete clothing mgmt down, performed anterior peri care seated     Functional mobility during ADLs: Minimal assistance;Cueing for safety General ADL Comments: Pt bumped into doorframe on the R side upon return to room likely 2/2 visual impairment.     Vision Baseline Vision/History: 1 Wears glasses Patient Visual Report: Other (comment) (pt unable to verbalize 2/2 cog/comm deficits) Vision Assessment?: Vision impaired- to be further tested in functional context;Wears glasses for reading;Yes Eye Alignment: Within Functional Limits Ocular Range of Motion: Impaired-to be further tested in functional context Alignment/Gaze Preference: Within Defined Limits Tracking/Visual Pursuits: Right eye does not track laterally;Right eye does not track medially;Impaired - to be further tested in functional context (inconsistent) Visual Fields: Right visual field deficit;Impaired-to be further tested in functional context (inconclusive assessment 2/2 cog/comm deficits) Depth Perception: Overshoots;Undershoots Additional Comments: pt with difficulty tracking on command and following peripheral field testing, suspect 2/2 comm/cog deficits     Perception         Praxis         Pertinent  Vitals/Pain Pain Assessment Pain Assessment: Faces Faces Pain Scale: No hurt     Extremity/Trunk Assessment Upper Extremity Assessment Upper Extremity Assessment: Generalized weakness (proximal limitations in strength at B shoulders (with + pain reaction), daughter reporting no known hx of arthritis or shoulder issues PTA)       Cervical / Trunk Assessment Cervical / Trunk Assessment: Normal   Communication Communication Communication: Impaired Factors Affecting Communication: Difficulty expressing self;Reduced clarity of speech (receptive aphasia also suspected)   Cognition Arousal: Alert Behavior During Therapy: WFL for tasks assessed/performed Cognition: Cognition impaired   Orientation impairments: Place, Situation (unable to choose hospital from multiple choice cues, re-orientation provided) Awareness: Intellectual awareness impaired Memory impairment (select all impairments): Short-term memory, Working memory Attention impairment (select first level of impairment): Sustained attention Executive functioning impairment (select all impairments): Organization, Sequencing, Reasoning, Problem solving OT - Cognition Comments: pt using various self-care items inaccurately (ie using mouthwash to wash face), needs cues for task termination and identifying tools appropraitely (sequences rote tasks well once prompted but anticipate inc assist needed for unfamiliar task sequencing)                 Following commands: Impaired Following commands impaired: Follows one step commands inconsistently (with multimodal cueing)     Cueing  General Comments   Cueing Techniques: Verbal cues;Gestural cues;Tactile cues;Visual cues  daughter present and supportive during session   Exercises     Shoulder Instructions      Home Living Family/patient expects to be discharged to:: Private residence Living Arrangements: Children (daughter) Available Help at Discharge: Family Type of  Home: House Home Access: Stairs to enter Secretary/administrator of Steps: 5 STE Entrance Stairs-Rails: None (wall on one side) Home Layout: Able to  live on main level with bedroom/bathroom     Bathroom Shower/Tub: Producer, television/film/video: Standard     Home Equipment: None   Additional Comments: Pt's daughter works Museum/gallery exhibitions officer; picks up weekday hours occasionally. Pt SIL is home Saturday and Sunday. Pt sister who lives in [place] is willing to come and help out.      Prior Functioning/Environment Prior Level of Function : Independent/Modified Independent;Driving             Mobility Comments: pt flew from [facility] by herself 2 weeks ago PTA ADLs Comments: Pt enjoys sewing; has a pillbox but does not use it    OT Problem List: Impaired balance (sitting and/or standing);Impaired vision/perception;Decreased cognition;Decreased safety awareness   OT Treatment/Interventions: Self-care/ADL training;Neuromuscular education;Therapeutic activities;Cognitive remediation/compensation;Visual/perceptual remediation/compensation;Patient/family education;Balance training      OT Goals(Current goals can be found in the care plan section)   Acute Rehab OT Goals Patient Stated Goal: pt did not state a goal; daughter is amenable to post-acute rehab OT Goal Formulation: With patient/family Time For Goal Achievement: 08/08/24 Potential to Achieve Goals: Good   OT Frequency:  Min 2X/week    Co-evaluation PT/OT/SLP Co-Evaluation/Treatment: Yes Reason for Co-Treatment: Complexity of the patient's impairments (multi-system involvement);Necessary to address cognition/behavior during functional activity;To address functional/ADL transfers   OT goals addressed during session: ADL's and self-care      AM-PAC OT 6 Clicks Daily Activity     Outcome Measure Help from another person eating meals?: A Little Help from another person taking care of personal grooming?: A Lot Help from another person  toileting, which includes using toliet, bedpan, or urinal?: A Little Help from another person bathing (including washing, rinsing, drying)?: A Lot Help from another person to put on and taking off regular upper body clothing?: A Little Help from another person to put on and taking off regular lower body clothing?: A Little 6 Click Score: 16   End of Session Equipment Utilized During Treatment: Gait belt Nurse Communication: Mobility status  Activity Tolerance: Patient tolerated treatment well Patient left: in bed;with call bell/phone within reach;with bed alarm set;with family/visitor present  OT Visit Diagnosis: Unsteadiness on feet (R26.81);Other abnormalities of gait and mobility (R26.89);Other symptoms and signs involving the nervous system (R29.898);Cognitive communication deficit (R41.841) Symptoms and signs involving cognitive functions: Cerebral infarction                Time: 9096-9060 OT Time Calculation (min): 36 min Charges:  OT General Charges $OT Visit: 1 Visit OT Evaluation $OT Eval Moderate Complexity: 1 Mod OT Treatments $Self Care/Home Management : 8-22 mins  Zamiya Dillard D., MS, OTR/L Acute Rehabilitation Services [phone] Secure Chat Preferred  Rikki Milch 07/25/2024, 11:23 AM

## 2024-07-25 NOTE — Progress Notes (Addendum)
 Inpatient Rehab Admissions:  Inpatient Rehab Consult received.  I met with patient and her daughter Oneta at the bedside for rehabilitation assessment and to discuss goals and expectations of an inpatient rehab admission.  Pt was asleep so talked with pt's daughter. Discussed average length of stay, insurance authorization requirement and discharge home after completion of CIR. [name] acknowledged understanding and is interested in pt pursuing CIR. She confirmed that family will be able to provide support for pt after discharge. Will continue to follow.   Insurance authorization started.    Signed: Tinnie Yvone Cohens, MS, CCC-SLP Admissions Coordinator [phone]

## 2024-07-25 NOTE — Progress Notes (Signed)
 Physical Therapy Treatment Patient Details Name: Tiffany [name] MRN: [id number] DOB: October 18, 1941 Today's Date: 07/25/2024   History of Present Illness Tiffany [name] is a 83 y.o. female with a PMHx of HTN, recently started on Eliquis  per report, presenting to the ED with garbled speech. Daughter brought her in for what was thought to be confusion and not acting normally. The patient was oriented only to her name on arrival. Labs revealed severe anemia with a Hgb of 6.1. CT head revealed a subacute left temporo-occipital ischemic infarct; received transfusion in the ED; with a PMHx of HTN, recently started on Eliquis  per report    PT Comments  Pt daughter at bedside and pt agreeable to participate in physical therapy session. PTA, pt lives with her daughter and son in law and is completely independent; she took a flight 2 weeks ago to [place]  by herself. Pt with a gross change from her baseline and demonstrates visual deficits, apraxia, decreased standing balance, impaired communication and cognition. Pt requiring min-mod assist for functional mobility. Ambulating limited distance in hallway with no AD and consistent cueing for obstacle negotiation and to increase environmental awareness. Patient will benefit from intensive inpatient follow-up therapy, >3 hours/day for a multidisciplinary approach to address deficits and maximize functional mobility.   If plan is discharge home, recommend the following: A little help with walking and/or transfers;A little help with bathing/dressing/bathroom;Assistance with cooking/housework;Supervision due to cognitive status   Can travel by private vehicle        Equipment Recommendations  None recommended by PT    Recommendations for Other Services OT consult;Speech consult     Precautions / Restrictions Precautions Precautions: Fall Recall of Precautions/Restrictions: Impaired Precaution/Restrictions Comments: R visual field cut Restrictions Weight  Bearing Restrictions Per Provider Order: No     Mobility  Bed Mobility Overal bed mobility: Needs Assistance Bed Mobility: Supine to Sit     Supine to sit: Supervision     General bed mobility comments: Able to sit up on edge of bed with cueing, but without physical assist    Transfers Overall transfer level: Needs assistance Equipment used: None Transfers: Sit to/from Stand Sit to Stand: Contact guard assist           General transfer comment: CGA for safety    Ambulation/Gait Ambulation/Gait assistance: Mod assist, Min assist Gait Distance (Feet): 100 Feet Assistive device: None Gait Pattern/deviations: Step-through pattern, Decreased stride length Gait velocity: decreased     General Gait Details: MinA over level surface negotiation, up to modA with obstacles or in tight spaces. Right lateral lean and drift, running into right side of doorway without awareness.   Stairs             Wheelchair Mobility     Tilt Bed    Modified Rankin (Stroke Patients Only) Modified Rankin (Stroke Patients Only) Pre-Morbid Rankin Score: No symptoms Modified Rankin: Moderately severe disability     Balance                                            Communication Communication Communication: Impaired Factors Affecting Communication: Difficulty expressing self  Cognition Arousal: Alert Behavior During Therapy: WFL for tasks assessed/performed   PT - Cognitive impairments: Difficult to assess Difficult to assess due to: Impaired communication  PT - Cognition Comments: Pt with expressive and receptive language deficits; seems to be able to make basic needs known such as toileting and feeling cold. Intermittent verbal vs gestural command following Following commands: Impaired Following commands impaired: Follows one step commands inconsistently (and multimodal cueing)    Cueing Cueing Techniques: Verbal cues,  Gestural cues, Tactile cues, Visual cues  Exercises      General Comments        Pertinent Vitals/Pain Pain Assessment Pain Assessment: Faces Faces Pain Scale: No hurt    Home Living Family/patient expects to be discharged to:: (P) Private residence Living Arrangements: (P) Children Available Help at Discharge: (P) Family Type of Home: (P) House Home Access: (P) Stairs to enter Entrance Stairs-Rails: (P) None (wall on one side) Entrance Stairs-Number of Steps: (P) 5   Home Layout: (P) Able to live on main level with bedroom/bathroom Home Equipment: (P) None Additional Comments: (P) Pt daughter works Fri-Sun; picks up weekday hours occasionally. Pt SIL is home Saturday and Sunday. Pt sister who lives in [place] is willing to come and help out.    Prior Function            PT Goals (current goals can now be found in the care plan section) Acute Rehab PT Goals Patient Stated Goal: to take a nap PT Goal Formulation: Patient unable to participate in goal setting Time For Goal Achievement: 08/06/24 Potential to Achieve Goals: Fair Progress towards PT goals: Progressing toward goals    Frequency    Min 3X/week      PT Plan      Co-evaluation   Reason for Co-Treatment: Complexity of the patient's impairments (multi-system involvement);Necessary to address cognition/behavior during functional activity;To address functional/ADL transfers   OT goals addressed during session: ADL's and self-care      AM-PAC PT 6 Clicks Mobility   Outcome Measure  Help needed turning from your back to your side while in a flat bed without using bedrails?: None Help needed moving from lying on your back to sitting on the side of a flat bed without using bedrails?: A Little Help needed moving to and from a bed to a chair (including a wheelchair)?: A Little Help needed standing up from a chair using your arms (e.g., wheelchair or bedside chair)?: A Little Help needed to walk in hospital  room?: A Lot Help needed climbing 3-5 steps with a railing? : A Lot 6 Click Score: 17    End of Session Equipment Utilized During Treatment: Gait belt Activity Tolerance: Patient tolerated treatment well Patient left: in bed;with call bell/phone within reach;with bed alarm set Nurse Communication: Mobility status PT Visit Diagnosis: Other abnormalities of gait and mobility (R26.89);Other symptoms and signs involving the nervous system (R29.898)     Time: 9141-9076 PT Time Calculation (min) (ACUTE ONLY): 25 min  Charges:    $Therapeutic Activity: 8-22 mins PT General Charges $$ ACUTE PT VISIT: 1 Visit                     Aleck Daring, PT, DPT Acute Rehabilitation Services Office [phone]    Aleck ONEIDA Daring 07/25/2024, 10:21 AM

## 2024-07-25 NOTE — Consult Note (Signed)
 Physical Medicine and Rehabilitation Consult Reason for Consult:Impaired functional mobility, confusion Referring Physician: Juvenal   HPI: Tiffany [name] is a 83 y.o. female of [place] descent with a history of HTN, DVT (on eliquis ) who was amitted on 9/20 for garbled speech. 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 CTA demonstrated left MCA, M3-M4 branch occlusion. MRI completed and reveals no significant change in left MCA infarct as demonstrated on CT.  Hgb was down to 5.6 on admit. Pt transfused 2u PRBC and GI consulted and recommended outpt colonoscopy. No active bleeding found. Hgb stabilizing. Eliquis  was resumed 9/22. Bp control without scheduled meds currently. Pt on regular diet currently.  Pt was up with therapies and was contact guard assist for sit-std transfers and walked 100' min to mod assist. Basic ADL's performed at min-mod assist level. Pt was independent and driving prior to admit. She plans to discharge home whom she lives with. House has 5 steps to enter.     Home: Home Living Family/patient expects to be discharged to:: Private residence Living Arrangements: Children (daughter) Available Help at Discharge: Family Type of Home: House Home Access: Stairs to enter Secretary/administrator of Steps: 5 STE Entrance Stairs-Rails: None (wall on one side) Home Layout: Able to live on main level with bedroom/bathroom Bathroom Shower/Tub: Health visitor: Standard Home Equipment: None Additional Comments: Pt's daughter works Museum/gallery exhibitions officer; picks up weekday hours occasionally. Pt SIL is home Saturday and Sunday. Pt sister who lives in [place] is willing to come and help out.  Functional History: Prior Function Prior Level of Function : Independent/Modified Independent, Driving Mobility Comments: pt flew from [facility] by herself 2 weeks ago PTA ADLs Comments: Pt enjoys sewing; has a pillbox but does not use  it Functional Status:  Mobility: Bed Mobility Overal bed mobility: Needs Assistance Bed Mobility: Supine to Sit, Sit to Supine Supine to sit: Supervision Sit to supine: Supervision, HOB elevated General bed mobility comments: Cues to initiate return to supine at end of session Transfers Overall transfer level: Needs assistance Equipment used: None Transfers: Sit to/from Stand, Bed to chair/wheelchair/BSC Sit to Stand: Contact guard assist Bed to/from chair/wheelchair/BSC transfer type:: Step pivot Step pivot transfers: Min assist General transfer comment: Inc assist to stand from toilet 2/2 lower surface height. Ambulation/Gait Ambulation/Gait assistance: Mod assist, Min assist Gait Distance (Feet): 100 Feet Assistive device: None Gait Pattern/deviations: Step-through pattern, Decreased stride length General Gait Details: MinA over level surface negotiation, up to modA with obstacles or in tight spaces. Right lateral lean and drift, running into right side of doorway without awareness. Gait velocity: decreased    ADL: ADL Overall ADL's : Needs assistance/impaired Grooming: Minimal assistance, Wash/dry hands, Wash/dry face, Oral care, Moderate assistance, Cueing for safety, Cueing for sequencing, Standing Grooming Details (indicate cue type and reason): pt using grooming items improperly (used mouthwash to wipe face), needs cues for proper sequencing and identification of self-care items (especially items located to the R side) Toilet Transfer: Minimal assistance, Cueing for safety, Ambulation, Regular Toilet Toilet Transfer Details (indicate cue type and reason): cues for safe approach to toilet and to control descent/use of grab bars Toileting- Clothing Manipulation and Hygiene: Minimal assistance, Cueing for safety, Sit to/from stand Toileting - Clothing Manipulation Details (indicate cue type and reason): prompted to complete clothing mgmt down, performed anterior peri care  seated Functional mobility during ADLs: Minimal assistance, Cueing for safety General ADL Comments: Pt bumped into doorframe on the R side  upon return to room likely 2/2 visual impairment.  Cognition: Cognition Overall Cognitive Status: Impaired/Different from baseline Arousal/Alertness: Awake/alert Orientation Level: Oriented to person, Disoriented to place, Disoriented to time, Disoriented to situation Comments: TBD as communication improves, does not appear to have awareness of language deficits however Cognition Arousal: Alert Behavior During Therapy: WFL for tasks assessed/performed Overall Cognitive Status: Impaired/Different from baseline   Review of Systems  Unable to perform ROS: Mental acuity   Past Medical History:  Diagnosis Date   Hypertension    History reviewed. No pertinent surgical history. History reviewed. No pertinent family history. Social History:  reports that she has never smoked. She has never been exposed to tobacco smoke. She has never used smokeless tobacco. She reports current alcohol use. She reports that she does not use drugs. Allergies:  Allergies  Allergen Reactions   Shellfish Allergy    Medications Prior to Admission  Medication Sig Dispense Refill   apixaban  (ELIQUIS ) 5 MG TABS tablet Take 5 mg by mouth 2 (two) times daily.     dorzolamide  (TRUSOPT ) 2 % ophthalmic solution Place 1 drop into both eyes 3 (three) times daily.     LUMIGAN 0.01 % SOLN Place 1 drop into both eyes at bedtime.     metoprolol  tartrate (LOPRESSOR ) 50 MG tablet Take 50 mg by mouth daily.     mirtazapine  (REMERON ) 7.5 MG tablet Take 7.5 mg by mouth at bedtime.     PREBIOTIC PRODUCT PO Take 1 capsule by mouth daily.     predniSONE  (DELTASONE ) 5 MG tablet Take 5 mg by mouth daily.     Probiotic Product (PROBIOTIC PO) Take 1 capsule by mouth daily.       Blood pressure 138/78, pulse 97, temperature 98 F (36.7 C), temperature source Oral, resp. rate 17, height 5'  (1.524 m), weight 59 kg, SpO2 98%. Physical Exam Constitutional:      General: She is not in acute distress.    Appearance: Normal appearance.  HENT:     Head: Normocephalic.     Nose: Nose normal.     Mouth/Throat:     Mouth: Mucous membranes are moist.  Eyes:     Pupils: Pupils are equal, round, and reactive to light.  Cardiovascular:     Rate and Rhythm: Normal rate.  Pulmonary:     Effort: Pulmonary effort is normal.  Abdominal:     Palpations: Abdomen is soft.  Musculoskeletal:        General: No swelling or tenderness. Normal range of motion.     Cervical back: Normal range of motion.  Skin:    General: Skin is warm.  Neurological:     Comments: Pt alert, oriented to name, hospital. Mild right central VII. Speech is fairly clear but language is inconsistent, partly due to cultural factors but also appears to have receptive and expressive language deficits d/t stroke. Struggled with simple one and two-step commands. Typically able to complete a simple task with repetition and tactile cueing. MMT: RUE at least 4/5 prox to distal. LUE sl stronger with 4+/5. RLE 4- to 4/5 prox to distal. LLE 4/5 to 4+/5 prox to distal. Sensed pain and light touch in all 4 limbs. No abnl resting tone. DTR's tr to 1+.   Psychiatric:     Comments: Pt generally pleasant and tried to cooperate     Results for orders placed or performed during the hospital encounter of 07/22/24 (from the past 24 hours)  CBC with Differential/Platelet  Status: Abnormal   Collection Time: 07/25/24  4:34 AM  Result Value Ref Range   WBC 14.5 (H) 4.0 - 10.5 K/uL   RBC 3.69 (L) 3.87 - 5.11 MIL/uL   Hemoglobin 9.2 (L) 12.0 - 15.0 g/dL   HCT 71.3 (L) 63.9 - 53.9 %   MCV 77.5 (L) 80.0 - 100.0 fL   MCH 24.9 (L) 26.0 - 34.0 pg   MCHC 32.2 30.0 - 36.0 g/dL   RDW 82.6 (H) 88.4 - 84.4 %   Platelets 419 (H) 150 - 400 K/uL   nRBC 0.2 0.0 - 0.2 %   Neutrophils Relative % 71 %   Neutro Abs 10.2 (H) 1.7 - 7.7 K/uL    Lymphocytes Relative 15 %   Lymphs Abs 2.2 0.7 - 4.0 K/uL   Monocytes Relative 12 %   Monocytes Absolute 1.8 (H) 0.1 - 1.0 K/uL   Eosinophils Relative 1 %   Eosinophils Absolute 0.1 0.0 - 0.5 K/uL   Basophils Relative 0 %   Basophils Absolute 0.0 0.0 - 0.1 K/uL   Immature Granulocytes 1 %   Abs Immature Granulocytes 0.21 (H) 0.00 - 0.07 K/uL  Basic metabolic panel     Status: Abnormal   Collection Time: 07/25/24  4:34 AM  Result Value Ref Range   Sodium 140 135 - 145 mmol/L   Potassium 3.6 3.5 - 5.1 mmol/L   Chloride 108 98 - 111 mmol/L   CO2 21 (L) 22 - 32 mmol/L   Glucose, Bld 100 (H) 70 - 99 mg/dL   BUN 9 8 - 23 mg/dL   Creatinine, Ser 9.08 0.44 - 1.00 mg/dL   Calcium  8.6 (L) 8.9 - 10.3 mg/dL   GFR, Estimated >39 >39 mL/min   Anion gap 11 5 - 15   ECHOCARDIOGRAM COMPLETE Result Date: 07/24/2024    ECHOCARDIOGRAM REPORT   Patient Name:   Tiffany [name] Date of Exam: 07/24/2024 Medical Rec #:  [id number]        Height:       60.0 in Accession #:    [id number]       Weight:       130.0 lb Date of Birth:  10/17/1941         BSA:          1.554 m Patient Age:    83 years         BP:           122/67 mmHg Patient Gender: F                HR:           96 bpm. Exam Location:  Inpatient Procedure: 2D Echo, Cardiac Doppler and Color Doppler (Both Spectral and Color            Flow Doppler were utilized during procedure). Indications:   Abnormal ECG R94.31  History:       Patient has no prior history of Echocardiogram examinations.                Stroke.  Sonographer:   Thea Norlander RCS Referring      [facility] SAMTANI Phys: IMPRESSIONS  1. Left ventricular ejection fraction, by estimation, is 60 to 65%. The left ventricle has normal function. The left ventricle has no regional wall motion abnormalities. Left ventricular diastolic parameters were normal.  2. Right ventricular systolic function is normal. The right ventricular size is normal.  3. The mitral valve is normal in structure.  No  evidence of mitral valve regurgitation. No evidence of mitral stenosis.  4. The aortic valve is abnormal. There is mild thickening of the aortic valve with a small subcentimeter echodensity at the LV side of the valve. There is mild aortic valve regurgitation. Consider TEE for further evaluation in setting of recent CVA.  5. The inferior vena cava is normal in size with greater than 50% respiratory variability, suggesting right atrial pressure of 3 mmHg. FINDINGS  Left Ventricle: Left ventricular ejection fraction, by estimation, is 60 to 65%. The left ventricle has normal function. The left ventricle has no regional wall motion abnormalities. The left ventricular internal cavity size was normal in size. There is  no left ventricular hypertrophy. Left ventricular diastolic parameters were normal. Right Ventricle: The right ventricular size is normal. No increase in right ventricular wall thickness. Right ventricular systolic function is normal. Left Atrium: Left atrial size was normal in size. Right Atrium: Right atrial size was normal in size. Pericardium: There is no evidence of pericardial effusion. Mitral Valve: The mitral valve is normal in structure. No evidence of mitral valve regurgitation. No evidence of mitral valve stenosis. Tricuspid Valve: The tricuspid valve is normal in structure. Tricuspid valve regurgitation is not demonstrated. No evidence of tricuspid stenosis. Aortic Valve: The aortic valve is abnormal. There is mild thickening of the aortic valve. Aortic valve regurgitation is mild. No aortic stenosis is present. Aortic valve peak gradient measures 10.4 mmHg. Pulmonic Valve: The pulmonic valve was normal in structure. Pulmonic valve regurgitation is not visualized. No evidence of pulmonic stenosis. Aorta: The aortic root is normal in size and structure. Venous: The inferior vena cava is normal in size with greater than 50% respiratory variability, suggesting right atrial pressure of 3 mmHg.  IAS/Shunts: No atrial level shunt detected by color flow Doppler.  LEFT VENTRICLE PLAX 2D LVIDd:         4.20 cm   Diastology LVIDs:         2.50 cm   LV e' medial:    9.46 cm/s LV PW:         0.90 cm   LV E/e' medial:  6.6 LV IVS:        0.90 cm   LV e' lateral:   14.70 cm/s LVOT diam:     2.00 cm   LV E/e' lateral: 4.2 LV SV:         51 LV SV Index:   33 LVOT Area:     3.14 cm  RIGHT VENTRICLE             IVC RV S prime:     13.80 cm/s  IVC diam: 1.40 cm TAPSE (M-mode): 1.9 cm LEFT ATRIUM             Index        RIGHT ATRIUM           Index LA diam:        4.30 cm 2.77 cm/m   RA Area:     10.50 cm LA Vol (A2C):   39.8 ml 25.61 ml/m  RA Volume:   18.63 ml  11.99 ml/m LA Vol (A4C):   26.8 ml 17.24 ml/m LA Biplane Vol: 30.1 ml 19.37 ml/m  AORTIC VALVE AV Area (Vmax): 1.92 cm AV Vmax:        161.00 cm/s AV Peak Grad:   10.4 mmHg LVOT Vmax:      98.50 cm/s LVOT Vmean:     65.200  cm/s LVOT VTI:       0.163 m  AORTA Ao Root diam: 3.00 cm Ao Asc diam:  3.60 cm MITRAL VALVE MV Area (PHT): 5.31 cm    SHUNTS MV Decel Time: 143 msec    Systemic VTI:  0.16 m MV E velocity: 62.40 cm/s  Systemic Diam: 2.00 cm MV A velocity: 96.40 cm/s MV E/A ratio:  0.65 Aditya Sabharwal Electronically signed by Ria Commander Signature Date/Time: 07/24/2024/3:45:09 PM    Final    MR BRAIN WO CONTRAST Result Date: 07/24/2024 CLINICAL DATA:  Provided history: Stroke, follow-up. EXAM: MRI HEAD WITHOUT CONTRAST TECHNIQUE: Multiplanar, multiecho pulse sequences of the brain and surrounding structures were obtained without intravenous contrast. COMPARISON:  Non-contrast head CT and CT angiogram head/neck 07/23/2024. FINDINGS: Intermittently motion degraded examination. Most notably, the axial T1 sequence is moderate to severely motion degraded and the coronal T2 sequence is severely motion degraded. Within this limitation, findings are swallows. Brain: Generalized cerebral atrophy. Large acute left MCA territory infarct affecting the  posterior insula, posterior frontal lobe, parietal lobe and occipital lobe. This infarct does not appear significantly changed in extent as compared to yesterday's head CT. No significant mass effect at this time. No evidence of hemorrhagic conversion. Tiny acute cortical infarct within the perirolandic right frontoparietal region. Tiny acute infarct within the right parietal white matter (series 2, image 36). Mild multifocal T2 FLAIR hyperintense signal abnormality elsewhere within the cerebral white matter and pons, nonspecific but compatible chronic small ischemic disease. Redemonstrated chronic microhemorrhage within the left occipital lobe. Tiny acute infarcts within the bilateral cerebellar hemispheres. probable small (2-3 mm) meningioma along the right petrous apex better appreciated on the prior brain MRI of 12/28/2021 (series 6, image 8 of the current exam). No extra-axial fluid collection. No midline shift. Vascular: Please see the CTA head/neck performed yesterday. Skull and upper cervical spine: No focal race marrow lesion. Sinuses/Orbits: No mass or acute finding within the imaged orbits. Prior bilateral ocular lens replacement. No significant paranasal sinus disease. IMPRESSION: 1. Intermittently motion degraded examination as described. Within this limitation, findings are as follows. 2. Large acute left MCA territory infarct, as described and not significantly changed in extent since yesterday's head CT. No significant mass effect at this time. No evidence of hemorrhagic conversion. 3. Additional tiny acute infarcts within the perirolandic right frontoparietal cortex, right parietal white matter and bilateral cerebellar hemispheres. 4. Background parenchymal atrophy and chronic small vessel ischemic disease. 5. 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 Electronically Signed   By: Rockey Childs D.O.   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  Does the need for close, 24 hr/day medical supervision in concert with the patient's rehab needs make it unreasonable for this patient to be served in a less intensive setting? Yes Co-Morbidities requiring supervision/potential complications:  -right popliteal, tibial and peroneal vein DVT's -Anemia, GIB -HTN Due to bladder management, bowel management, safety, skin/wound care, disease management, medication administration, pain management, and patient education, does the patient require 24 hr/day rehab nursing? Yes Does the patient require coordinated care of a physician, rehab nurse, therapy disciplines of PT, OT, SLP to address physical and functional deficits in the context of the above medical diagnosis(es)? Yes Addressing deficits in the following areas: balance, endurance, locomotion, strength, transferring, bowel/bladder control, bathing, dressing, feeding, grooming, toileting,  cognition, language, and psychosocial support Can the patient actively participate in an intensive therapy program of at least 3 hrs of therapy per day at least 5 days per week? Yes The potential for patient to make measurable gains while on inpatient rehab is excellent Anticipated functional outcomes upon discharge from inpatient rehab are supervision  with PT, supervision with OT, supervision and min assist with SLP. Estimated rehab length of stay to reach the above functional goals is: 7-9 days Anticipated discharge destination: Home Overall Rehab/Functional Prognosis: excellent  POST ACUTE RECOMMENDATIONS: This patient's condition is appropriate for continued rehabilitative care in the following setting: CIR Patient has agreed to participate in recommended program. Will need to follow up with daughter Note that insurance prior authorization may be required for  reimbursement for recommended care.  Comment: Rehab Admissions Coordinator to follow up.    I have personally performed a face to face diagnostic evaluation of this patient. Additionally, I have examined the patient's medical record including any pertinent labs and radiographic images.    Thanks,  Arthea ONEIDA Gunther, MD 07/25/2024

## 2024-07-25 NOTE — Progress Notes (Signed)
 Right lower extremity venous duplex has been completed. Preliminary results can be found in CV Proc through chart review.  Results were given to the patient's nurse, Guerry.   07/25/24 10:46 AM Cathlyn Collet RVT

## 2024-07-26 DIAGNOSIS — R4701 Aphasia: Secondary | ICD-10-CM | POA: Diagnosis not present

## 2024-07-26 LAB — CBC WITH DIFFERENTIAL/PLATELET
Abs Immature Granulocytes: 0.17 K/uL — ABNORMAL HIGH (ref 0.00–0.07)
Basophils Absolute: 0.1 K/uL (ref 0.0–0.1)
Basophils Relative: 0 %
Eosinophils Absolute: 0.3 K/uL (ref 0.0–0.5)
Eosinophils Relative: 2 %
HCT: 27.5 % — ABNORMAL LOW (ref 36.0–46.0)
Hemoglobin: 8.4 g/dL — ABNORMAL LOW (ref 12.0–15.0)
Immature Granulocytes: 1 %
Lymphocytes Relative: 20 %
Lymphs Abs: 2.9 K/uL (ref 0.7–4.0)
MCH: 24.3 pg — ABNORMAL LOW (ref 26.0–34.0)
MCHC: 30.5 g/dL (ref 30.0–36.0)
MCV: 79.5 fL — ABNORMAL LOW (ref 80.0–100.0)
Monocytes Absolute: 1.9 K/uL — ABNORMAL HIGH (ref 0.1–1.0)
Monocytes Relative: 13 %
Neutro Abs: 9.3 K/uL — ABNORMAL HIGH (ref 1.7–7.7)
Neutrophils Relative %: 64 %
Platelets: 386 K/uL (ref 150–400)
RBC: 3.46 MIL/uL — ABNORMAL LOW (ref 3.87–5.11)
RDW: 17.9 % — ABNORMAL HIGH (ref 11.5–15.5)
WBC: 14.5 K/uL — ABNORMAL HIGH (ref 4.0–10.5)
nRBC: 0.1 % (ref 0.0–0.2)

## 2024-07-26 MED ORDER — ATORVASTATIN CALCIUM 10 MG PO TABS
20.0000 mg | ORAL_TABLET | Freq: Every day | ORAL | Status: DC
Start: 2024-07-26 — End: 2024-07-31
  Administered 2024-07-26 – 2024-07-31 (×11): 20 mg via ORAL
  Filled 2024-07-26 (×6): qty 2

## 2024-07-26 MED ORDER — ACETAMINOPHEN 325 MG PO TABS
650.0000 mg | ORAL_TABLET | Freq: Four times a day (QID) | ORAL | Status: DC | PRN
  Administered 2024-07-29 (×2): 650 mg via ORAL
  Filled 2024-07-26 (×2): qty 2

## 2024-07-26 NOTE — Progress Notes (Signed)
 PROGRESS NOTE    Tiffany [name]  FMW:[id number] DOB: May 21, 1941 DOA: 07/22/2024 PCP: Benjamine Aland, MD   Brief Narrative:  Tiffany [name] is a 83 y.o. female with a PMHx of HTN, recently started on Eliquis  per report, presenting to the ED with garbled speech. Daughter brought her in for what was thought to be confusion and not acting normally. The patient was oriented only to her name on arrival. Labs revealed severe anemia with a Hgb of 6.1. CT head revealed a subacute left temporo-occipital ischemic infarct.  Admitted under hospital service, seen by neurology however currently awaiting placement to CIR.  Details below.   Assessment & Plan:   Principal Problem:   Aphasia Active Problems:   Stroke (cerebrum) (HCC)  Acute Ischemic Infarct:  left occipitotemporal infarct  Etiology: Embolic Code Stroke CT head- Acute/early subacute nonhemorrhagic left occipitotemporal infarct. There sulcal effacement but no  significant mass effect. Atrophy and small-vessel disease.  Otherwise stable exam. Patchy sclerosis and thickening of the squamous portion of the left temporal bone. No change since the 04/28/2024 CT, and appears to have been present on a 12/28/2021 MRI brain although not as well depicted. This could represent pagetoid changes of the bone or changes due to fibrous dysplasia, but a bone metastasis is not strictly excluded. 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 Occlusive thrombus in the small saphenous vein. 2D Echo shows EF of 60 to 65%, no diastolic dysfunction and no PFO. LDL 73 but goal<70-not on any antilipid.  Will start on atorvastatin  20 mg. HgbA1c 5.2 VTE prophylaxis - SCDs Eliquis  (apixaban ) daily prior to admission, resumed eliquis . Therapy recommendations: CIR Disposition: CIR-pending  DVT Started on Eliquis  on 9/9   Anemia: Reportedly her anemia is considered to be chronic.  She present  with hemoglobin of 5.6.  FOBT x 1 was negative.  GI was consulted and previous hospitalist Dr. Royal discussed with Dr. Stacia via secure chat who reviewed the chart and felt that work-up is low-yield without active bleed and neg Hemoccult, and would hold on anesthesia and GI work-up at this time given recent CVA.  Patient received 2 units of PRBC, patient's hemoglobin has remained stable over 8 ever since.   Hypertension: Blood pressure stable, patient on Lopressor  12.5 mg p.o. twice daily.   Dysphagia Seen by SLP, now on regular diet and tolerating.  DVT prophylaxis: SCDs Start: 07/23/24 0800 SCDs Start: 07/23/24 0800   Code Status: Full Code  Family Communication:  None present at bedside.  Plan of care discussed with patient in length and he/she verbalized understanding and agreed with it.  Status is: Inpatient Remains inpatient appropriate because: Medically stable, pending placement to CIR.   Estimated body mass index is 25.39 kg/m as calculated from the following:   Height as of this encounter: 5' (1.524 m).   Weight as of this encounter: 59 kg.    Nutritional Assessment: Body mass index is 25.39 kg/m.[name] Seen by dietician.  I agree with the assessment and plan as outlined below: Nutrition Status:        . Skin Assessment: I have examined the patient's skin and I agree with the wound assessment as performed by the wound care RN as outlined below:    Consultants:  GI-curb sided Neurology-signed off  Procedures:  Above  Antimicrobials:  Anti-infectives (From admission, onward)    None         Subjective: Patient  seen and examined, no complaints.  Objective: Vitals:   07/26/24 0002 07/26/24 0350 07/26/24 0740 07/26/24 1145  BP: 132/77 118/77 121/71 125/76  Pulse: 88 89 92 87  Resp: 18 18 18 19   Temp: 98 F (36.7 C) 98.7 F (37.1 C) 98.7 F (37.1 C) 98.6 F (37 C)  TempSrc: Oral Oral Oral Oral  SpO2: 99% 98% 99% 97%  Weight:      Height:        No intake or output data in the 24 hours ending 07/26/24 1443 Filed Weights   07/22/24 2205  Weight: 59 kg    Examination:  General exam: Appears calm and comfortable  Respiratory system: Clear to auscultation. Respiratory effort normal. Cardiovascular system: S1 & S2 heard, RRR. No JVD, murmurs, rubs, gallops or clicks. No pedal edema. Gastrointestinal system: Abdomen is nondistended, soft and nontender. No organomegaly or masses felt. Normal bowel sounds heard. Central nervous system: Alert and oriented. No focal neurological deficits. Extremities: Symmetric 5 x 5 power. Skin: No rashes, lesions or ulcers  Data Reviewed: I have personally reviewed following labs and imaging studies  CBC: Recent Labs  Lab 07/22/24 2221 07/22/24 2303 07/23/24 1223 07/24/24 0217 07/25/24 0434 07/26/24 0212  WBC 16.0*  --  15.0* 13.6* 14.5* 14.5*  NEUTROABS  --   --  10.5* 8.9* 10.2* 9.3*  HGB 5.6* 6.1* 8.9* 8.6* 9.2* 8.4*  HCT 19.4* 18.0* 28.3* 26.5* 28.6* 27.5*  MCV 76.1*  --  78.8* 76.1* 77.5* 79.5*  PLT 597*  --  446* 410* 419* 386   Basic Metabolic Panel: Recent Labs  Lab 07/22/24 2221 07/22/24 2303 07/24/24 0217 07/25/24 0434 07/25/24 1432  NA 138 138 138 140 140  K 3.6 3.6 3.8 3.6 3.7  CL 105 106 105 108 106  CO2 18*  --  20* 21* 21*  GLUCOSE 98 95 93 100* 110*  BUN 15 14 15 9 13   CREATININE 0.83 0.80 0.88 0.91 0.87  CALCIUM  8.7*  --  8.2* 8.6* 8.8*   GFR: Estimated Creatinine Clearance: 40.1 mL/min (by C-G formula based on SCr of 0.87 mg/dL). Liver Function Tests: Recent Labs  Lab 07/22/24 2221 07/24/24 0217  AST 25 23  ALT 16 15  ALKPHOS 149* 141*  BILITOT 0.9 1.2  PROT 7.8 6.5  ALBUMIN 2.7* 2.3*   No results for input(s): LIPASE, AMYLASE in the last 168 hours. No results for input(s): AMMONIA in the last 168 hours. Coagulation Profile: Recent Labs  Lab 07/24/24 0217  INR 1.3*   Cardiac Enzymes: No results for input(s): CKTOTAL, CKMB,  CKMBINDEX, TROPONINI in the last 168 hours. BNP (last 3 results) No results for input(s): PROBNP in the last 8760 hours. HbA1C: No results for input(s): HGBA1C in the last 72 hours. CBG: Recent Labs  Lab 07/23/24 0042  GLUCAP 77   Lipid Profile: Recent Labs    07/24/24 0217  CHOL 127  HDL 45  LDLCALC 73  TRIG 47  CHOLHDL 2.8   Thyroid Function Tests: No results for input(s): TSH, T4TOTAL, FREET4, T3FREE, THYROIDAB in the last 72 hours. Anemia Panel: No results for input(s): VITAMINB12, FOLATE, FERRITIN, TIBC, IRON, RETICCTPCT in the last 72 hours. Sepsis Labs: No results for input(s): PROCALCITON, LATICACIDVEN in the last 168 hours.  No results found for this or any previous visit (from the past 240 hours).   Radiology Studies: VAS US  LOWER EXTREMITY VENOUS (DVT) Result Date: 07/25/2024  Lower Venous DVT Study Patient Name:  HAN LYSNE  Date  of Exam:   07/25/2024 Medical Rec #: [id number]         Accession #:    [id number] Date of Birth: Jul 12, 1941          Patient Gender: F Patient Age:   51 years Exam Location:  [facility] Procedure:      VAS US  LOWER EXTREMITY VENOUS (DVT) Referring Phys: HARLENE BOWL --------------------------------------------------------------------------------  Indications: Superficial venous thrombosis (SVT) I82.819.  Risk Factors: None identified. Comparison Study: No prior studies. Performing Technologist: Cordella Collet RVT  Examination Guidelines: A complete evaluation includes B-mode imaging, spectral Doppler, color Doppler, and power Doppler as needed of all accessible portions of each vessel. Bilateral testing is considered an integral part of a complete examination. Limited examinations for reoccurring indications may be performed as noted. The reflux portion of the exam is performed with the patient in reverse Trendelenburg.  +---------+---------------+---------+-----------+----------+--------------+  RIGHT    CompressibilityPhasicitySpontaneityPropertiesThrombus Aging +---------+---------------+---------+-----------+----------+--------------+ CFV      Full           Yes      Yes                                 +---------+---------------+---------+-----------+----------+--------------+ SFJ      Full                                                        +---------+---------------+---------+-----------+----------+--------------+ FV Prox  Full                                                        +---------+---------------+---------+-----------+----------+--------------+ FV Mid   Full                                                        +---------+---------------+---------+-----------+----------+--------------+ FV DistalFull                                                        +---------+---------------+---------+-----------+----------+--------------+ PFV      Full                                                        +---------+---------------+---------+-----------+----------+--------------+ POP      None           No       No                   Acute          +---------+---------------+---------+-----------+----------+--------------+ PTV      Partial  Acute          +---------+---------------+---------+-----------+----------+--------------+ PERO     None                                         Acute          +---------+---------------+---------+-----------+----------+--------------+ Gastroc  Full                                                        +---------+---------------+---------+-----------+----------+--------------+   +----+---------------+---------+-----------+----------+--------------+ LEFTCompressibilityPhasicitySpontaneityPropertiesThrombus Aging +----+---------------+---------+-----------+----------+--------------+ CFV Full           Yes      Yes                                  +----+---------------+---------+-----------+----------+--------------+    Summary: RIGHT: - Findings consistent with acute deep vein thrombosis involving the right popliteal vein, right posterior tibial veins, and right peroneal veins.  - No cystic structure found in the popliteal fossa.  LEFT: - No evidence of common femoral vein obstruction.   *See table(s) above for measurements and observations. Electronically signed by Debby Robertson on 07/25/2024 at 5:14:24 PM.    Final     Scheduled Meds:  apixaban   5 mg Oral BID   dorzolamide   1 drop Both Eyes TID   latanoprost   1 drop Both Eyes QHS   metoprolol  tartrate  12.5 mg Oral Daily   mirtazapine   7.5 mg Oral QHS   pantoprazole  (PROTONIX ) IV  40 mg Intravenous Q12H   predniSONE   5 mg Oral Daily   Continuous Infusions:   LOS: 3 days   Fredia Skeeter, MD [facility]  07/26/2024, 2:43 PM   *Please note that this is a verbal dictation therefore any spelling or grammatical errors are due to the Dragon Medical One system interpretation.  Please page via Amion and do not message via secure chat for urgent patient care matters. Secure chat can be used for non urgent patient care matters.  How to contact the [facility] Attending or Consulting provider 7A - 7P or covering provider during after hours 7P -7A, for this patient?  Check the care team in [facility] and look for a) attending/consulting [facility] provider listed and b) the [facility] team listed. Page or secure chat 7A-7P. Log into [email or web address] and use [facility] universal password to access. If you do not have the password, please contact the hospital operator. Locate the [facility] provider you are looking for under [facility] and page to a number that you can be directly reached. If you still have difficulty reaching the provider, please page the [facility] (Director on Call) for the Hospitalists listed on amion for assistance.

## 2024-07-26 NOTE — Progress Notes (Signed)
 Speech Language Pathology Treatment: Cognitive-Linguistic  Patient Details Name: Tiffany [name] MRN: [id number] DOB: 1940-12-11 Today's Date: 07/26/2024 Time: 8986-8967 SLP Time Calculation (min) (ACUTE ONLY): 19 min  Assessment / Plan / Recommendation Clinical Impression  Patient was alert, awake, and fully participated in session. SLP began session with naming activity using common objects from [facility] box. The patient experienced significant difficulty in object naming but showed ability to describe and gesturally communicate object function. SLP then worked with patient on object function description. SLP provided moderate to maximum instructional cueing for 90% accuracy. At the end of activity, patient expressed that she had a headache. SLP finished session and notified nurse of patient's complaint of headache. SLP will continue to follow.   HPI HPI: Tiffany [name] is a 83 y.o. female with a PMHx of HTN, recently started on Eliquis  per report, presenting to the ED with garbled speech. Daughter brought her in for what was thought to be confusion and not acting normally. The patient was oriented only to her name on arrival. Labs revealed severe anemia with a Hgb of 6.1. CT head revealed a subacute left temporo-occipital ischemic infarct; received transfusion in the ED; with a PMHx of HTN      SLP Plan  Continue with current plan of care          Recommendations                         Frequent or constant Supervision/Assistance Aphasia (R47.01)     Continue with current plan of care   Damien Hy  Graduate SLP Clinican

## 2024-07-26 NOTE — Care Management Important Message (Signed)
 Important Message  Patient Details  Name: Tiffany [name] MRN: [id number] Date of Birth: [birth year]   Important Message Given:  Yes - Medicare IM     Claretta Deed 07/26/2024, 1:57 PM

## 2024-07-26 NOTE — Plan of Care (Signed)
  Problem: Education: Goal: Knowledge of disease or condition will improve Outcome: Progressing   Problem: Ischemic Stroke/TIA Tissue Perfusion: Goal: Complications of ischemic stroke/TIA will be minimized Outcome: Progressing   Problem: Health Behavior/Discharge Planning: Goal: Goals will be collaboratively established with patient/family Outcome: Progressing   Problem: Self-Care: Goal: Ability to participate in self-care as condition permits will improve Outcome: Progressing

## 2024-07-26 NOTE — H&P (Signed)
 Physical Medicine and Rehabilitation Admission H&P    Chief Complaint  Patient presents with   Functional deficits due to stroke.     HPI: Tiffany [name] is an 83 year old female with history of HTN, syncope, pre-diabetes, anemia,  glaucoma, recent diagnosis of DVT- on eliquis ;  who was admitted on 07/23/24 with gabled speech and confusion. 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  CTA negative for LVO and showed posterior L-MCA M3 or M4 brach occlusion. MRI brain revealed large acute L-MCA territory infarct without changes,  patchy sclerosis and thickening squamous portion of left temporal bone and additional tiny acute infarcts in right perirolantic and frontotemporal cortex, right parietal and bilateral cerebellar hemispheres.  2 D echo showed EF 60-65% with no wall abnormality and mild AVR without stenosis. She was transfused with 2 units PRBC and anemia felt to be chronic and to monitor H/H w/ outpatient colonoscopy recommended per discussion with Dr. Stacia.   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 Eliquis  resumed on 09/22 with recommendations of 30 day event monitor after d/c to rule out A fib.  Repeat BLE dopplers done revealing acute DVT right poplitea, peroneal and posterior tib veins 09/23. She has had issues with fever 101.2 on 09/27 as well as ongoing issues with tachycardia which was treated with IVF and monitored off antibiotics. PT/OT/ST consulted and patient noted to have expressive deficits, requires min to mod assist with ADL tasks with difficulty sequencing and initiation--question apraxia as well as  CGA- min assist with mobility. CIR recommended due to functional decline.     Review of Systems  Unable to perform ROS: Language     Past Medical History:  Diagnosis Date   Hypertension     History reviewed. No pertinent surgical  history.   History reviewed. No pertinent family history.   Social History:  reports that she has never smoked. She has never been exposed to tobacco smoke. She has never used smokeless tobacco. She reports current alcohol use. She reports that she does not use drugs.    Allergies  Allergen Reactions   Shellfish Allergy     Medications Prior to Admission  Medication Sig Dispense Refill   apixaban  (ELIQUIS ) 5 MG TABS tablet Take 5 mg by mouth 2 (two) times daily.     dorzolamide  (TRUSOPT ) 2 % ophthalmic solution Place 1 drop into both eyes 3 (three) times daily.     LUMIGAN 0.01 % SOLN Place 1 drop into both eyes at bedtime.     metoprolol  tartrate (LOPRESSOR ) 50 MG tablet Take 50 mg by mouth daily.     mirtazapine  (REMERON ) 7.5 MG tablet Take 7.5 mg by mouth at bedtime.     PREBIOTIC PRODUCT PO Take 1 capsule by mouth daily.     predniSONE  (DELTASONE ) 5 MG tablet Take 5 mg by mouth daily.     Probiotic Product (PROBIOTIC PO) Take 1 capsule by mouth daily.       Home: Home Living Family/patient expects to be discharged to:: Private residence Living Arrangements: Children Available Help at Discharge: Family Type of Home: House Home Access: Stairs to enter Secretary/administrator of Steps: 5 STE Entrance Stairs-Rails: None (wall on one side) Home Layout: Able to live on main level with bedroom/bathroom Bathroom Shower/Tub: Health visitor: Standard Bathroom Accessibility: Yes Home Equipment: None Additional Comments: Pt's daughter works Museum/gallery exhibitions officer; picks  up weekday hours occasionally. Pt SIL is home Saturday and Sunday. Pt sister who lives in [place] is willing to come and help out.  Lives With: Daughter   Functional History: Prior Function Prior Level of Function : Independent/Modified Independent, Driving Mobility Comments: pt flew from [facility] by herself 2 weeks ago PTA ADLs Comments: Pt enjoys sewing; has a pillbox but does not use it  Functional Status:   Mobility: Bed Mobility Overal bed mobility: Needs Assistance Bed Mobility: Sit to Supine Supine to sit: Contact guard, HOB elevated, Used rails Sit to supine: Supervision, HOB elevated General bed mobility comments: increased effort to get LE into bed. Transfers Overall transfer level: Needs assistance Equipment used: None Transfers: Sit to/from Stand Sit to Stand: Contact guard assist Bed to/from chair/wheelchair/BSC transfer type:: Step pivot Step pivot transfers: Min assist General transfer comment: PT requires steadying once up in standing. Ambulation/Gait Ambulation/Gait assistance: Min assist, Mod assist Gait Distance (Feet): 300 Feet Assistive device: None Gait Pattern/deviations: Step-through pattern, Decreased stride length, Staggering left, Staggering right, Drifts right/left General Gait Details: MinA over level surface negotiation, up to modA 2x with obstacles or in tight spaces. Right lateral lean and drift, running into right side of the hall. Improved moderately with verbal cues for using tiles on the floor for maintaining a straight line. Gait velocity: decreased Gait velocity interpretation: <1.8 ft/sec, indicate of risk for recurrent falls Stairs: Yes Stairs assistance: Min assist Stair Management: One rail Right, One rail Left, Step to pattern Number of Stairs: 2 General stair comments: Min A for navigating stairs with rails.    ADL: ADL Overall ADL's : Needs assistance/impaired Grooming: Moderate assistance, Oral care, Wash/dry hands, Cueing for sequencing, Standing Grooming Details (indicate cue type and reason): heavy multimodal cues for verbally identifying and locating grooming items located to the R side, cued for proper sequencing and problem solving through task initiation/termination Upper Body Dressing : Minimal assistance, Cueing for sequencing, Sitting Upper Body Dressing Details (indicate cue type and reason): initially donned shirt in wrong arm  hole, cued to identify problem and formulate solution, cued for proper sequencing and item identification Toilet Transfer: Minimal assistance, Cueing for safety, Ambulation, Regular Toilet Toilet Transfer Details (indicate cue type and reason): cues for safe approach to toilet and to control descent/use of grab bars Toileting- Clothing Manipulation and Hygiene: Minimal assistance, Cueing for safety, Sit to/from stand Toileting - Clothing Manipulation Details (indicate cue type and reason): prompted to complete clothing mgmt down, performed anterior peri care seated Functional mobility during ADLs: Minimal assistance, Cueing for safety General ADL Comments: Pt bumped into doorframe on the R side upon return to room likely 2/2 visual impairment.  Cognition: Cognition Overall Cognitive Status: Impaired/Different from baseline Arousal/Alertness: Awake/alert Orientation Level: Disoriented to time Comments: TBD as communication improves, does not appear to have awareness of language deficits however Cognition Arousal: Alert Behavior During Therapy: [facility] for tasks assessed/performed Overall Cognitive Status: Impaired/Different from baseline   Blood pressure 111/67, pulse (!) 116, temperature 98.9 F (37.2 C), temperature source Oral, resp. rate 19, height 5' (1.524 m), weight 59 kg, SpO2 100%. Physical Exam Vitals and nursing note reviewed.  Constitutional:      General: She is not in acute distress.    Appearance: Normal appearance.  HENT:     Head: Normocephalic and atraumatic.     Right Ear: External ear normal.     Left Ear: External ear normal.     Nose: Nose normal.     Mouth/Throat:  Mouth: Mucous membranes are moist.  Eyes:     Conjunctiva/sclera: Conjunctivae normal.  Cardiovascular:     Rate and Rhythm: Tachycardia present.     Heart sounds: No murmur heard.    No gallop.  Pulmonary:     Effort: Pulmonary effort is normal. No respiratory distress.     Breath sounds: No  wheezing or rales.  Abdominal:     General: Bowel sounds are normal. There is no distension.     Tenderness: There is no abdominal tenderness.  Musculoskeletal:        General: Tenderness (right SCM, traps, levator scapulae) present. No swelling.     Cervical back: Normal range of motion.     Comments: Reported right shoulder pain but able to range independently without pain.   Skin:    General: Skin is warm and dry.  Neurological:     Mental Status: She is alert.     Comments: Alert, oriented to self and hospital intermittently. Expressive > receptive deficits with echolalia. Has poor awareness of deficits.  Able to follow simple motor commands with verbal and visual cues. MMT: RUE 4/5 and LUE 4-/5 to 4/5 prox to distal. BLE 3+ HF, KE and 4/5 ADF/PF. Sensed pain in all 4's. No abnl resting tone. DTR's 1+  Psychiatric:     Comments: Flat, distracted     Results for orders placed or performed during the hospital encounter of 07/22/24 (from the past 48 hours)  CBC with Differential/Platelet     Status: Abnormal   Collection Time: 07/30/24  4:54 AM  Result Value Ref Range   WBC 16.3 (H) 4.0 - 10.5 K/uL   RBC 3.54 (L) 3.87 - 5.11 MIL/uL   Hemoglobin 8.7 (L) 12.0 - 15.0 g/dL   HCT 72.3 (L) 63.9 - 53.9 %   MCV 78.0 (L) 80.0 - 100.0 fL   MCH 24.6 (L) 26.0 - 34.0 pg   MCHC 31.5 30.0 - 36.0 g/dL   RDW 81.8 (H) 88.4 - 84.4 %   Platelets 390 150 - 400 K/uL   nRBC 0.0 0.0 - 0.2 %   Neutrophils Relative % 67 %   Neutro Abs 10.8 (H) 1.7 - 7.7 K/uL   Lymphocytes Relative 16 %   Lymphs Abs 2.6 0.7 - 4.0 K/uL   Monocytes Relative 14 %   Monocytes Absolute 2.3 (H) 0.1 - 1.0 K/uL   Eosinophils Relative 2 %   Eosinophils Absolute 0.3 0.0 - 0.5 K/uL   Basophils Relative 0 %   Basophils Absolute 0.1 0.0 - 0.1 K/uL   Immature Granulocytes 1 %   Abs Immature Granulocytes 0.22 (H) 0.00 - 0.07 K/uL    Comment: Performed at [facility], [address] [place], [place] [address]  Basic  metabolic panel     Status: Abnormal   Collection Time: 07/30/24  4:54 AM  Result Value Ref Range   Sodium 138 135 - 145 mmol/L   Potassium 3.7 3.5 - 5.1 mmol/L   Chloride 106 98 - 111 mmol/L   CO2 19 (L) 22 - 32 mmol/L   Glucose, Bld 102 (H) 70 - 99 mg/dL    Comment: Glucose reference range applies only to samples taken after fasting for at least 8 hours.   BUN 23 8 - 23 mg/dL   Creatinine, Ser 9.09 0.44 - 1.00 mg/dL   Calcium  8.6 (L) 8.9 - 10.3 mg/dL   GFR, Estimated >39 >39 mL/min    Comment: (NOTE) Calculated using the  CKD-EPI Creatinine Equation (2021)    Anion gap 13 5 - 15    Comment: Performed at [facility], [address] [place], [place] [address]   No results found.     Blood pressure 111/67, pulse (!) 116, temperature 98.9 F (37.2 C), temperature source Oral, resp. rate 19, height 5' (1.524 m), weight 59 kg, SpO2 100%.  Medical Problem List and Plan: 1. Functional deficits secondary to left temporal-occipital infarct  -patient may  shower  -ELOS/Goals: 7-9 days, supervision goals with PT, OT and sup/min with SLP 2.  DVT/Antithrombotics: -DVT/anticoagulation:  Pharmaceutical: Eliquis   -antiplatelet therapy: N/A 3. Pain Management: tylenol  prn.  4. Mood/Behavior/Sleep: LCSW to follow for evaluation and support.  --melatonin prn insomnia.   -antipsychotic agents: N\/A 5. Neuropsych/cognition: This patient is not capable of making decisions on her own behalf. 6. Skin/Wound Care: Routine pressure relief measures.  7. Fluids/Electrolytes/Nutrition: Monitor I/O. Check CMET in am. 8.   Acute on chronic anemia: Monitor for signs of bleeding. H/H stable 6.1-->8.7. --add iron supplement as Iron level <10.  9.  Resting tachycardia: HR ranging in 110-120 at rest. CTA to rule out PE?  -pt is not in distress, breathing comfortably, no chest pain  --Afebrile X 24 hours.   --Monitor for symptoms with increase in activity.  10. Leukocytosis: WBC on upward  trend--16.0-->14.5-->16.3  --Sepsis work up prn temp elevation     Sharlet GORMAN Schmitz, PA-C 07/31/2024

## 2024-07-26 NOTE — Progress Notes (Signed)
 PT Cancellation Note  Patient Details Name: Tiffany [name] MRN: [id number] DOB: November 08, 1940   Cancelled Treatment:    Reason Eval/Treat Not Completed: (P) Fatigue/lethargy limiting ability to participate (Pt reports she is sleeping and requests therapist to come back. Will follow up as able.)   Darryle George 07/26/2024, 11:26 AM

## 2024-07-26 NOTE — Progress Notes (Signed)
 Inpatient Rehab Admissions Coordinator:   Awaiting insurance determination.  Will follow.   Reche Lowers, PT, DPT Admissions Coordinator [phone] 07/26/24  9:55 AM

## 2024-07-26 NOTE — Plan of Care (Signed)

## 2024-07-27 DIAGNOSIS — R4701 Aphasia: Secondary | ICD-10-CM | POA: Diagnosis not present

## 2024-07-27 LAB — CBC WITH DIFFERENTIAL/PLATELET
Abs Immature Granulocytes: 0.2 K/uL — ABNORMAL HIGH (ref 0.00–0.07)
Basophils Absolute: 0.1 K/uL (ref 0.0–0.1)
Basophils Relative: 0 %
Eosinophils Absolute: 0.3 K/uL (ref 0.0–0.5)
Eosinophils Relative: 2 %
HCT: 29.2 % — ABNORMAL LOW (ref 36.0–46.0)
Hemoglobin: 8.9 g/dL — ABNORMAL LOW (ref 12.0–15.0)
Immature Granulocytes: 1 %
Lymphocytes Relative: 17 %
Lymphs Abs: 2.5 K/uL (ref 0.7–4.0)
MCH: 24.4 pg — ABNORMAL LOW (ref 26.0–34.0)
MCHC: 30.5 g/dL (ref 30.0–36.0)
MCV: 80 fL (ref 80.0–100.0)
Monocytes Absolute: 1.8 K/uL — ABNORMAL HIGH (ref 0.1–1.0)
Monocytes Relative: 12 %
Neutro Abs: 10 K/uL — ABNORMAL HIGH (ref 1.7–7.7)
Neutrophils Relative %: 68 %
Platelets: 393 K/uL (ref 150–400)
RBC: 3.65 MIL/uL — ABNORMAL LOW (ref 3.87–5.11)
RDW: 18.1 % — ABNORMAL HIGH (ref 11.5–15.5)
WBC: 14.7 K/uL — ABNORMAL HIGH (ref 4.0–10.5)
nRBC: 0 % (ref 0.0–0.2)

## 2024-07-27 NOTE — Progress Notes (Signed)
 Inpatient Rehab Admissions Coordinator:   I received a denial from [facility] for CIR citing lack of medical necessity and lack of therapy complexity, despite pt with diagnosis of acute CVA and requiring up to mod assist for mobility, which is a decline from her PLOF of independent.  She clearly meets criteria for CIR and pt/daughter in agreement to pursue expedited appeal.  I will start this today.   Reche Lowers, PT, DPT Admissions Coordinator [phone] 07/27/24  3:51 PM

## 2024-07-27 NOTE — Progress Notes (Signed)
 Occupational Therapy Treatment Patient Details Name: Tiffany [name] MRN: [id number] DOB: 20-Mar-1941 Today's Date: 07/27/2024   History of present illness Tiffany [name] is a 83 y.o. female with a PMHx of HTN, recently started on Eliquis  per report, presenting to the ED with garbled speech. Daughter brought her in for what was thought to be confusion and not acting normally. The patient was oriented only to her name on arrival. Labs revealed severe anemia with a Hgb of 6.1. CT head revealed a subacute left temporo-occipital ischemic infarct; received transfusion in the ED; with a PMHx of HTN, recently started on Eliquis  per report   OT comments  Pt greeted in supine, agreeable for OT visit. Initially drowsy, but wakefulness improved with stimulation. Pt oriented to self, requiring frequent re-orientation. She completed oral hygiene standing at the sink with mod A for successful completion (requiring assist for stability and extensive cognitive and visual cueing). Required min A for proper sequencing of UE dressing as well. Pt is highly distractible and safety/falls risk is significantly increased when dividing attention and dual tasking, with several LOB, often scissoring feet, needing min A to correct. AMPAC 16/24 indicating impaired functional status. Pt is still recommended for high-intensity post-acute rehab (>3 hours/day); OT to continue to follow.      If plan is discharge home, recommend the following:      Equipment Recommendations       Recommendations for Other Services      Precautions / Restrictions Precautions Precautions: Fall Recall of Precautions/Restrictions: Impaired Precaution/Restrictions Comments: R visual field cut Restrictions Weight Bearing Restrictions Per Provider Order: No       Mobility Bed Mobility Overal bed mobility: Needs Assistance Bed Mobility: Supine to Sit     Supine to sit: Contact guard, HOB elevated, Used rails     General bed mobility  comments: Inc effort, exited to the L side    Transfers Overall transfer level: Needs assistance Equipment used: None Transfers: Sit to/from Stand, Bed to chair/wheelchair/BSC Sit to Stand: Contact guard assist           General transfer comment: Stood from bed with cues for sequencing, assist to prevent anterior LOB     Balance Overall balance assessment: Needs assistance Sitting-balance support: No upper extremity supported, Feet supported Sitting balance-Leahy Scale: Good   Postural control: Other (comment) (anterior lean intermittently during mobility) Standing balance support: Single extremity supported, During functional activity Standing balance-Leahy Scale: Poor Standing balance comment: HHA provided for functional mobility, pt often scissoring feet, with several lateral LOB, especially during dual tasking & dividing attention                           ADL either performed or assessed with clinical judgement   ADL Overall ADL's : Needs assistance/impaired     Grooming: Moderate assistance;Oral care;Wash/dry hands;Cueing for sequencing;Standing Grooming Details (indicate cue type and reason): heavy multimodal cues for verbally identifying and locating grooming items located to the R side, cued for proper sequencing and problem solving through task initiation/termination         Upper Body Dressing : Minimal assistance;Cueing for sequencing;Sitting Upper Body Dressing Details (indicate cue type and reason): initially donned shirt in wrong arm hole, cued to identify problem and formulate solution, cued for proper sequencing and item identification                        Extremity/Trunk Assessment Upper Extremity Assessment  Upper Extremity Assessment: Generalized weakness            Vision   Vision Assessment?: Vision impaired- to be further tested in functional context;Wears glasses for reading   Perception Perception Perception:  Impaired Preception Impairment Details: Inattention/Neglect Perception-Other Comments: R inattention suspected, cues for visual scanning environment to locate functional items located to the R side   Praxis     Communication Communication Communication: Impaired Factors Affecting Communication: Difficulty expressing self;Reduced clarity of speech   Cognition Arousal: Alert Behavior During Therapy: WFL for tasks assessed/performed Cognition: Cognition impaired   Orientation impairments: Place, Situation, Time (may be compromised by impaired comm) Awareness: Intellectual awareness impaired Memory impairment (select all impairments): Short-term memory, Working memory Attention impairment (select first level of impairment): Sustained attention Executive functioning impairment (select all impairments): Organization, Sequencing, Reasoning, Problem solving OT - Cognition Comments: initially drowsy/sleeping, but wakefulness improved with stim                 Following commands: Impaired Following commands impaired: Follows one step commands inconsistently (multimodal cueing & inc time for processing)      Cueing   Cueing Techniques: Verbal cues, Gestural cues, Tactile cues, Visual cues  Exercises      Shoulder Instructions       General Comments handoff to PT at end of OT session    Pertinent Vitals/ Pain       Pain Assessment Pain Assessment: Faces Faces Pain Scale: No hurt  Home Living                                          Prior Functioning/Environment              Frequency           Progress Toward Goals  OT Goals(current goals can now be found in the care plan section)  Progress towards OT goals: Progressing toward goals     Plan      Co-evaluation                 AM-PAC OT 6 Clicks Daily Activity     Outcome Measure                    End of Session Equipment Utilized During Treatment: Gait belt       Activity Tolerance Patient tolerated treatment well   Patient Left  (handoff to PT at end of session)   Nurse Communication          Time: 8887-8868 OT Time Calculation (min): 19 min  Charges: OT General Charges $OT Visit: 1 Visit OT Treatments $Self Care/Home Management : 8-22 mins  Deirdre Gryder D., MSOT, OTR/L Acute Rehabilitation Services [phone] Secure Chat Preferred  Rikki Milch 07/27/2024, 11:52 AM

## 2024-07-27 NOTE — Progress Notes (Signed)
 Speech Language Pathology Treatment: Cognitive-Linguistic  Patient Details Name: Tiffany [name] MRN: [id number] DOB: 1941/08/03 Today's Date: 07/27/2024 Time: 8853-8794 SLP Time Calculation (min) (ACUTE ONLY): 19 min  Assessment / Plan / Recommendation Clinical Impression  Patient seen by SLP for skilled treatment focused on aphasia goals. As compared to session previous date, patient is significantly improved with her alertness, responsiveness and receptive and expressive language. She responded appropriately to SLP's open-ended questions related to her general wants/needs and recent events. She was able to communicate at phrase level such as when talking about her daughter bringing her food, She brought me something yesterday, it was nice, I liked it. She continues with word finding difficulty and struggled with telling SLP specific food items she likes and even with choice cues, was not able to state what type of food her daughter brought for her yesterday. She required moderate amount of partial phrase cues and semantic cues to be able to describe OT session that just occurred prior to this SLP session. She expressed wanting to go home and asked about this. SLP educated her on plan for inpatient rehab. SLP will continue to follow.   HPI HPI: Tiffany [name] is a 83 y.o. female with a PMHx of HTN, recently started on Eliquis  per report, presenting to the ED with garbled speech. Daughter brought her in for what was thought to be confusion and not acting normally. The patient was oriented only to her name on arrival. Labs revealed severe anemia with a Hgb of 6.1. CT head revealed a subacute left temporo-occipital ischemic infarct; received transfusion in the ED; with a PMHx of HTN      SLP Plan  Continue with current plan of care         Recommendations    SLP at next venue of care                      Frequent or constant Supervision/Assistance Aphasia (R47.01)     Continue  with current plan of care    Tiffany IVAR Blase, MA, CCC-SLP Speech Therapy

## 2024-07-27 NOTE — Progress Notes (Signed)
 Physical Therapy Treatment Patient Details Name: Tiffany [name] MRN: [id number] DOB: 07/15/1941 Today's Date: 07/27/2024   History of Present Illness Tiffany [name] is a 83 y.o. female with a PMHx of HTN, recently started on Eliquis  per report, presenting to the ED with garbled speech. Daughter brought her in for what was thought to be confusion and not acting normally. The patient was oriented only to her name on arrival. Labs revealed severe anemia with a Hgb of 6.1. CT head revealed a subacute left temporo-occipital ischemic infarct; received transfusion in the ED; with a PMHx of HTN, recently started on Eliquis  per report    PT Comments  Pt is presenting at supervision for bed mobility, CGA for sit to stand and Min to Mod A for gait without an AD. Pt scored a 4/24 on the DGI placing her at a high risk for falls. Pt PLOF was ind with all functional activities and is currently requiring a high level of assist. Pt is progressing well and was able to improve moderately with visual cues for gait. Due to pt current functional status, home set up and available assistance at home recommending skilled physical therapy services > 3 hours/day in order to address strength, balance and functional mobility to decrease risk for falls, injury, immobility, skin break down and re-hospitalization.     If plan is discharge home, recommend the following: A little help with walking and/or transfers;A little help with bathing/dressing/bathroom;Assistance with cooking/housework;Supervision due to cognitive status     Equipment Recommendations  None recommended by PT       Precautions / Restrictions Precautions Precautions: Fall Recall of Precautions/Restrictions: Impaired Precaution/Restrictions Comments: R visual field cut Restrictions Weight Bearing Restrictions Per Provider Order: No     Mobility  Bed Mobility Overal bed mobility: Needs Assistance Bed Mobility: Sit to Supine       Sit to supine:  Supervision, HOB elevated   General bed mobility comments: increased effort to get LE into bed.    Transfers Overall transfer level: Needs assistance Equipment used: None Transfers: Sit to/from Stand Sit to Stand: Contact guard assist           General transfer comment: PT requires steadying once up in standing.    Ambulation/Gait Ambulation/Gait assistance: Min assist, Mod assist Gait Distance (Feet): 300 Feet Assistive device: None Gait Pattern/deviations: Step-through pattern, Decreased stride length, Staggering left, Staggering right, Drifts right/left Gait velocity: decreased Gait velocity interpretation: <1.8 ft/sec, indicate of risk for recurrent falls   General Gait Details: MinA over level surface negotiation, up to modA 2x with obstacles or in tight spaces. Right lateral lean and drift, running into right side of the hall. Improved moderately with verbal cues for using tiles on the floor for maintaining a straight line.   Stairs Stairs: Yes Stairs assistance: Min assist Stair Management: One rail Right, One rail Left, Step to pattern Number of Stairs: 2 General stair comments: Min A for navigating stairs with rails.   Modified Rankin (Stroke Patients Only) Modified Rankin (Stroke Patients Only) Pre-Morbid Rankin Score: No symptoms Modified Rankin: Moderately severe disability     Balance Overall balance assessment: Needs assistance Sitting-balance support: No upper extremity supported, Feet supported Sitting balance-Leahy Scale: Good Sitting balance - Comments: no overt LOB observed   Standing balance support: Single extremity supported, During functional activity, No upper extremity supported Standing balance-Leahy Scale: Poor     Standardized Balance Assessment Standardized Balance Assessment : Dynamic Gait Index   Dynamic Gait Index Level Surface: Moderate  Impairment Change in Gait Speed: Moderate Impairment Gait with Horizontal Head Turns:  Severe Impairment Gait with Vertical Head Turns: Severe Impairment Gait and Pivot Turn: Moderate Impairment Step Over Obstacle: Severe Impairment Step Around Obstacles: Severe Impairment Steps: Moderate Impairment Total Score: 4      Communication Communication Communication: Impaired Factors Affecting Communication: Difficulty expressing self;Reduced clarity of speech  Cognition Arousal: Alert Behavior During Therapy: WFL for tasks assessed/performed   PT - Cognitive impairments: Difficult to assess Difficult to assess due to: Impaired communication       PT - Cognition Comments: Pt with expressive and receptive language deficits; seems to be able to make basic needs known Following commands: Impaired Following commands impaired: Follows one step commands with increased time, Follows multi-step commands inconsistently    Cueing Cueing Techniques: Verbal cues, Gestural cues, Tactile cues, Visual cues     General Comments General comments (skin integrity, edema, etc.): handoff to PT at end of OT session      Pertinent Vitals/Pain Pain Assessment Pain Assessment: No/denies pain     PT Goals (current goals can now be found in the care plan section) Acute Rehab PT Goals Patient Stated Goal: to take a nap PT Goal Formulation: Patient unable to participate in goal setting Time For Goal Achievement: 08/06/24 Potential to Achieve Goals: Fair Progress towards PT goals: Progressing toward goals    Frequency    Min 3X/week      PT Plan  Continue with current POC        AM-PAC PT 6 Clicks Mobility   Outcome Measure  Help needed turning from your back to your side while in a flat bed without using bedrails?: None Help needed moving from lying on your back to sitting on the side of a flat bed without using bedrails?: A Little Help needed moving to and from a bed to a chair (including a wheelchair)?: A Little Help needed standing up from a chair using your arms  (e.g., wheelchair or bedside chair)?: A Little Help needed to walk in hospital room?: A Lot Help needed climbing 3-5 steps with a railing? : A Lot 6 Click Score: 17    End of Session Equipment Utilized During Treatment: Gait belt Activity Tolerance: Patient tolerated treatment well Patient left: in bed;with call bell/phone within reach;with bed alarm set Nurse Communication: Mobility status PT Visit Diagnosis: Other abnormalities of gait and mobility (R26.89);Other symptoms and signs involving the nervous system (R29.898)     Time: 1129-1140 PT Time Calculation (min) (ACUTE ONLY): 11 min  Charges:    $Gait Training: 8-22 mins PT General Charges $$ ACUTE PT VISIT: 1 Visit                     Dorothyann Maier, DPT, CLT  Acute Rehabilitation Services Office: [phone] (Secure chat preferred)    Dorothyann VEAR Maier 07/27/2024, 1:12 PM

## 2024-07-27 NOTE — Progress Notes (Signed)
 PROGRESS NOTE    Tiffany [name]  FMW:[id number] DOB: [birth year] DOA: 07/22/2024 PCP: Benjamine Aland, MD   Brief Narrative:  Tiffany [name] is a 83 y.o. female with a PMHx of HTN, recently started on Eliquis  per report, presenting to the ED with garbled speech. Daughter brought her in for what was thought to be confusion and not acting normally. The patient was oriented only to her name on arrival. Labs revealed severe anemia with a Hgb of 6.1. CT head revealed a subacute left temporo-occipital ischemic infarct.  Admitted under hospitalist service, seen by neurology however currently awaiting placement to CIR.  Details below.   Assessment & Plan:   Principal Problem:   Aphasia Active Problems:   Stroke (cerebrum) (HCC)  Acute Ischemic Infarct:  left occipitotemporal infarct  Etiology: Embolic Code Stroke CT head- Acute/early subacute nonhemorrhagic left occipitotemporal infarct. There sulcal effacement but no  significant mass effect. Atrophy and small-vessel disease.  Otherwise stable exam. Patchy sclerosis and thickening of the squamous portion of the left temporal bone. No change since the 04/28/2024 CT, and appears to have been present on a 12/28/2021 MRI brain although not as well depicted. This could represent pagetoid changes of the bone or changes due to fibrous dysplasia, but a bone metastasis is not strictly excluded. 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 Occlusive thrombus in the small saphenous vein. 2D Echo shows EF of 60 to 65%, no diastolic dysfunction and no PFO. LDL 73 but goal<70-not on any antilipid.  Will start on atorvastatin  20 mg. HgbA1c 5.2 VTE prophylaxis - SCDs Eliquis  (apixaban ) daily prior to admission, resumed eliquis . Therapy recommendations: CIR Disposition: CIR-pending  DVT Started on Eliquis  on 9/9   Anemia: Reportedly her anemia is considered to be chronic.  She  presented with hemoglobin of 5.6.  FOBT x 1 was negative.  GI was consulted and previous hospitalist Dr. Royal discussed with Dr. Stacia via secure chat who reviewed the chart and felt that work-up is low-yield without active bleed and neg Hemoccult, and would hold on anesthesia and GI work-up at this time given recent CVA.  Patient received 2 units of PRBC on 07/23/2024, patient's hemoglobin has remained stable over 8 ever since.   Hypertension: Blood pressure stable, patient on Lopressor  12.5 mg p.o. twice daily.   Dysphagia Seen by SLP, now on regular diet and tolerating.  DVT prophylaxis: SCDs Start: 07/23/24 0800 SCDs Start: 07/23/24 0800   Code Status: Full Code  Family Communication:  None present at bedside.  Plan of care discussed with patient in length and he/she verbalized understanding and agreed with it.  Status is: Inpatient Remains inpatient appropriate because: Medically stable, pending placement   Estimated body mass index is 25.39 kg/m as calculated from the following:   Height as of this encounter: 5' (1.524 m).   Weight as of this encounter: 59 kg.    Nutritional Assessment: Body mass index is 25.39 kg/m.[name] Seen by dietician.  I agree with the assessment and plan as outlined below: Nutrition Status:        . Skin Assessment: I have examined the patient's skin and I agree with the wound assessment as performed by the wound care RN as outlined below:    Consultants:  GI-curb sided Neurology-signed off  Procedures:  Above  Antimicrobials:  Anti-infectives (From admission, onward)    None         Subjective: Seen  and examined.  She has no complaints.  Objective: Vitals:   07/26/24 1938 07/27/24 0003 07/27/24 0354 07/27/24 0716  BP: 132/77 128/77 137/84 120/73  Pulse: (!) 103 100 (!) 101 93  Resp: 19 19 19 15   Temp: 97.7 F (36.5 C) 98.1 F (36.7 C) 98.2 F (36.8 C) 98.5 F (36.9 C)  TempSrc: Oral Oral Oral Oral  SpO2: 95% 99% 99%  99%  Weight:      Height:       No intake or output data in the 24 hours ending 07/27/24 1050 Filed Weights   07/22/24 2205  Weight: 59 kg    Examination:  General exam: Appears calm and comfortable  Respiratory system: Clear to auscultation. Respiratory effort normal. Cardiovascular system: S1 & S2 heard, RRR. No JVD, murmurs, rubs, gallops or clicks. No pedal edema. Gastrointestinal system: Abdomen is nondistended, soft and nontender. No organomegaly or masses felt. Normal bowel sounds heard. Central nervous system: Alert and oriented. No focal neurological deficits. Extremities: Symmetric 5 x 5 power. Skin: No rashes, lesions or ulcers  Data Reviewed: I have personally reviewed following labs and imaging studies  CBC: Recent Labs  Lab 07/23/24 1223 07/24/24 0217 07/25/24 0434 07/26/24 0212 07/27/24 0453  WBC 15.0* 13.6* 14.5* 14.5* 14.7*  NEUTROABS 10.5* 8.9* 10.2* 9.3* 10.0*  HGB 8.9* 8.6* 9.2* 8.4* 8.9*  HCT 28.3* 26.5* 28.6* 27.5* 29.2*  MCV 78.8* 76.1* 77.5* 79.5* 80.0  PLT 446* 410* 419* 386 393   Basic Metabolic Panel: Recent Labs  Lab 07/22/24 2221 07/22/24 2303 07/24/24 0217 07/25/24 0434 07/25/24 1432  NA 138 138 138 140 140  K 3.6 3.6 3.8 3.6 3.7  CL 105 106 105 108 106  CO2 18*  --  20* 21* 21*  GLUCOSE 98 95 93 100* 110*  BUN 15 14 15 9 13   CREATININE 0.83 0.80 0.88 0.91 0.87  CALCIUM  8.7*  --  8.2* 8.6* 8.8*   GFR: Estimated Creatinine Clearance: 40.1 mL/min (by C-G formula based on SCr of 0.87 mg/dL). Liver Function Tests: Recent Labs  Lab 07/22/24 2221 07/24/24 0217  AST 25 23  ALT 16 15  ALKPHOS 149* 141*  BILITOT 0.9 1.2  PROT 7.8 6.5  ALBUMIN 2.7* 2.3*   No results for input(s): LIPASE, AMYLASE in the last 168 hours. No results for input(s): AMMONIA in the last 168 hours. Coagulation Profile: Recent Labs  Lab 07/24/24 0217  INR 1.3*   Cardiac Enzymes: No results for input(s): CKTOTAL, CKMB, CKMBINDEX,  TROPONINI in the last 168 hours. BNP (last 3 results) No results for input(s): PROBNP in the last 8760 hours. HbA1C: No results for input(s): HGBA1C in the last 72 hours. CBG: Recent Labs  Lab 07/23/24 0042  GLUCAP 77   Lipid Profile: No results for input(s): CHOL, HDL, LDLCALC, TRIG, CHOLHDL, LDLDIRECT in the last 72 hours.  Thyroid Function Tests: No results for input(s): TSH, T4TOTAL, FREET4, T3FREE, THYROIDAB in the last 72 hours. Anemia Panel: No results for input(s): VITAMINB12, FOLATE, FERRITIN, TIBC, IRON, RETICCTPCT in the last 72 hours. Sepsis Labs: No results for input(s): PROCALCITON, LATICACIDVEN in the last 168 hours.  No results found for this or any previous visit (from the past 240 hours).   Radiology Studies: No results found.   Scheduled Meds:  apixaban   5 mg Oral BID   atorvastatin   20 mg Oral Daily   dorzolamide   1 drop Both Eyes TID   latanoprost   1 drop Both Eyes QHS   metoprolol   tartrate  12.5 mg Oral Daily   mirtazapine   7.5 mg Oral QHS   pantoprazole  (PROTONIX ) IV  40 mg Intravenous Q12H   predniSONE   5 mg Oral Daily   Continuous Infusions:   LOS: 4 days   Fredia Skeeter, MD [facility]  07/27/2024, 10:50 AM   *Please note that this is a verbal dictation therefore any spelling or grammatical errors are due to the Dragon Medical One system interpretation.  Please page via Amion and do not message via secure chat for urgent patient care matters. Secure chat can be used for non urgent patient care matters.  How to contact the [facility] Attending or Consulting provider 7A - 7P or covering provider during after hours 7P -7A, for this patient?  Check the care team in [facility] and look for a) attending/consulting [facility] provider listed and b) the [facility] team listed. Page or secure chat 7A-7P. Log into [email or web address] and use [facility] universal password to access. If you do not have the password, please contact  the hospital operator. Locate the [facility] provider you are looking for under [facility] and page to a number that you can be directly reached. If you still have difficulty reaching the provider, please page the [facility] (Director on Call) for the Hospitalists listed on amion for assistance.

## 2024-07-28 DIAGNOSIS — R4701 Aphasia: Secondary | ICD-10-CM | POA: Diagnosis not present

## 2024-07-28 LAB — CBC WITH DIFFERENTIAL/PLATELET
Abs Immature Granulocytes: 0.14 K/uL — ABNORMAL HIGH (ref 0.00–0.07)
Basophils Absolute: 0.1 K/uL (ref 0.0–0.1)
Basophils Relative: 0 %
Eosinophils Absolute: 0.3 K/uL (ref 0.0–0.5)
Eosinophils Relative: 2 %
HCT: 27.2 % — ABNORMAL LOW (ref 36.0–46.0)
Hemoglobin: 8.4 g/dL — ABNORMAL LOW (ref 12.0–15.0)
Immature Granulocytes: 1 %
Lymphocytes Relative: 18 %
Lymphs Abs: 2.8 K/uL (ref 0.7–4.0)
MCH: 24.6 pg — ABNORMAL LOW (ref 26.0–34.0)
MCHC: 30.9 g/dL (ref 30.0–36.0)
MCV: 79.5 fL — ABNORMAL LOW (ref 80.0–100.0)
Monocytes Absolute: 2 K/uL — ABNORMAL HIGH (ref 0.1–1.0)
Monocytes Relative: 14 %
Neutro Abs: 9.6 K/uL — ABNORMAL HIGH (ref 1.7–7.7)
Neutrophils Relative %: 65 %
Platelets: 349 K/uL (ref 150–400)
RBC: 3.42 MIL/uL — ABNORMAL LOW (ref 3.87–5.11)
RDW: 18 % — ABNORMAL HIGH (ref 11.5–15.5)
WBC: 14.9 K/uL — ABNORMAL HIGH (ref 4.0–10.5)
nRBC: 0 % (ref 0.0–0.2)

## 2024-07-28 NOTE — TOC Progression Note (Signed)
 Transition of Care [facility]) - Progression Note    Patient Details  Name: Tiffany [name] MRN: [id number] Date of Birth: 1941/07/14  [facility] CM/SW Contact  [name], RN Phone Number: 07/28/2024, 10:00 AM  Clinical Narrative:     Pt/family appealing denial for CIR. IP Care management following.  Expected Discharge Plan: IP Rehab Facility Barriers to Discharge: Continued Medical Work up               Expected Discharge Plan and Services   Discharge Planning Services: CM Consult Post Acute Care Choice: IP Rehab Living arrangements for the past 2 months: Single Family Home                                       Social Drivers of Health (SDOH) Interventions SDOH Screenings   Social Connections: Patient Unable To Answer (07/23/2024)  Tobacco Use: Low Risk  (07/22/2024)    Readmission Risk Interventions     No data to display

## 2024-07-28 NOTE — Plan of Care (Signed)
  Problem: Education: Goal: Knowledge of disease or condition will improve 07/28/2024 0645 by Gaetana Randall Mathew GORMAN, RN Outcome: Progressing 07/28/2024 0523 by Gaetana Randall Mathew GORMAN, RN Outcome: Progressing Goal: Knowledge of secondary prevention will improve (MUST DOCUMENT ALL) 07/28/2024 0645 by Gaetana Randall Mathew GORMAN, RN Outcome: Progressing 07/28/2024 0523 by Gaetana Randall Mathew GORMAN, RN Outcome: Progressing Goal: Knowledge of patient specific risk factors will improve (DELETE if not current risk factor) 07/28/2024 0645 by Gaetana Randall Mathew GORMAN, RN Outcome: Progressing 07/28/2024 0523 by Gaetana Randall Mathew GORMAN, RN Outcome: Progressing Problem: Ischemic Stroke/TIA Tissue Perfusion: Goal: Complications of ischemic stroke/TIA will be minimized 07/28/2024 0645 by Gaetana Randall Mathew GORMAN, RN Outcome: Progressing 07/28/2024 0523 by Gaetana Randall Mathew GORMAN, RN Outcome: Progressing   Problem: Health Behavior/Discharge Planning: Goal: Ability to manage health-related needs will improve 07/28/2024 0645 by Gaetana Randall Mathew GORMAN, RN Outcome: Progressing 07/28/2024 0523 by Gaetana Randall Mathew GORMAN, RN Outcome: Progressing Goal: Goals will be collaboratively established with patient/family 07/28/2024 0645 by Gaetana Randall Mathew GORMAN, RN Outcome: Progressing 07/28/2024 0523 by Gaetana Randall Mathew GORMAN, RN Outcome: Progressing

## 2024-07-28 NOTE — Plan of Care (Signed)
   Problem: Education: Goal: Knowledge of disease or condition will improve Outcome: Progressing Goal: Knowledge of secondary prevention will improve (MUST DOCUMENT ALL) Outcome: Progressing

## 2024-07-28 NOTE — Plan of Care (Signed)
  Problem: Health Behavior/Discharge Planning: Goal: Ability to manage health-related needs will improve Outcome: Progressing Goal: Goals will be collaboratively established with patient/family Outcome: Progressing   Problem: Nutrition: Goal: Risk of aspiration will decrease Outcome: Progressing Goal: Dietary intake will improve Outcome: Progressing   Problem: Clinical Measurements: Goal: Ability to maintain clinical measurements within normal limits will improve Outcome: Progressing Goal: Will remain free from infection Outcome: Progressing Goal: Diagnostic test results will improve Outcome: Progressing Goal: Respiratory complications will improve Outcome: Progressing Goal: Cardiovascular complication will be avoided Outcome: Progressing   Problem: Activity: Goal: Risk for activity intolerance will decrease Outcome: Progressing   Problem: Elimination: Goal: Will not experience complications related to bowel motility Outcome: Progressing Goal: Will not experience complications related to urinary retention Outcome: Progressing   Problem: Pain Managment: Goal: General experience of comfort will improve and/or be controlled Outcome: Progressing   Problem: Safety: Goal: Ability to remain free from injury will improve Outcome: Progressing   Problem: Skin Integrity: Goal: Risk for impaired skin integrity will decrease Outcome: Progressing

## 2024-07-28 NOTE — Progress Notes (Signed)
 PROGRESS NOTE    Tiffany [name]  FMW:[id number] DOB: 01-Apr-1941 DOA: 07/22/2024 PCP: Benjamine Aland, MD   Brief Narrative:  Tiffany [name] is a 83 y.o. female with a PMHx of HTN, recently started on Eliquis  per report, presenting to the ED with garbled speech. Daughter brought her in for what was thought to be confusion and not acting normally. The patient was oriented only to her name on arrival. Labs revealed severe anemia with a Hgb of 6.1. CT head revealed a subacute left temporo-occipital ischemic infarct.  Admitted under hospitalist service, seen by neurology however currently awaiting placement to CIR.  Details below.   Assessment & Plan:   Principal Problem:   Aphasia Active Problems:   Stroke (cerebrum) (HCC)  Acute Ischemic Infarct:  left occipitotemporal infarct  Etiology: Embolic Code Stroke CT head- Acute/early subacute nonhemorrhagic left occipitotemporal infarct. There sulcal effacement but no  significant mass effect. Atrophy and small-vessel disease.  Otherwise stable exam. Patchy sclerosis and thickening of the squamous portion of the left temporal bone. No change since the 04/28/2024 CT, and appears to have been present on a 12/28/2021 MRI brain although not as well depicted. This could represent pagetoid changes of the bone or changes due to fibrous dysplasia, but a bone metastasis is not strictly excluded. 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 Occlusive thrombus in the small saphenous vein. 2D Echo shows EF of 60 to 65%, no diastolic dysfunction and no PFO. LDL 73 but goal<70-not on any antilipid.  Continue atorvastatin  20 mg. HgbA1c 5.2 VTE prophylaxis - SCDs Eliquis  (apixaban ) daily prior to admission, resumed eliquis . Therapy recommendations: CIR Disposition: CIR-pending  DVT Started on Eliquis  on 9/9   Anemia: Reportedly her anemia is considered to be chronic.  She presented  with hemoglobin of 5.6.  FOBT x 1 was negative.  GI was consulted and previous hospitalist Dr. Royal discussed with Dr. Stacia via secure chat who reviewed the chart and felt that work-up is low-yield without active bleed and neg Hemoccult, and would hold on anesthesia and GI work-up at this time given recent CVA.  Patient received 2 units of PRBC on 07/23/2024, patient's hemoglobin has remained stable over 8 ever since.   Hypertension: Blood pressure stable, patient on Lopressor  12.5 mg p.o. twice daily.   Dysphagia Seen by SLP, now on regular diet and tolerating.  DVT prophylaxis: SCDs Start: 07/23/24 0800 SCDs Start: 07/23/24 0800   Code Status: Full Code  Family Communication:  None present at bedside.  Plan of care discussed with patient in length and he/she verbalized understanding and agreed with it.  Status is: Inpatient Remains inpatient appropriate because: Medically stable, pending placement, expedited appeal for denial in progress.   Estimated body mass index is 25.39 kg/m as calculated from the following:   Height as of this encounter: 5' (1.524 m).   Weight as of this encounter: 59 kg.    Nutritional Assessment: Body mass index is 25.39 kg/m.[name] Seen by dietician.  I agree with the assessment and plan as outlined below: Nutrition Status:        . Skin Assessment: I have examined the patient's skin and I agree with the wound assessment as performed by the wound care RN as outlined below:    Consultants:  GI-curb sided Neurology-signed off  Procedures:  Above  Antimicrobials:  Anti-infectives (From admission, onward)    None  Subjective: Patient seen and examined.  No complaints.  Objective: Vitals:   07/27/24 1620 07/27/24 2024 07/28/24 0005 07/28/24 0419  BP: (!) 116/59 118/80 119/68 119/72  Pulse: 92 95 90 95  Resp: 16 16 17 16   Temp: 98.2 F (36.8 C) 99.6 F (37.6 C) 99.2 F (37.3 C) 99.1 F (37.3 C)  TempSrc: Oral Oral Oral  Oral  SpO2: 97% 96% 98% 97%  Weight:      Height:       No intake or output data in the 24 hours ending 07/28/24 0732 Filed Weights   07/22/24 2205  Weight: 59 kg    Examination:  General exam: Appears calm and comfortable  Respiratory system: Clear to auscultation. Respiratory effort normal. Cardiovascular system: S1 & S2 heard, RRR. No JVD, murmurs, rubs, gallops or clicks. No pedal edema. Gastrointestinal system: Abdomen is nondistended, soft and nontender. No organomegaly or masses felt. Normal bowel sounds heard. Central nervous system: Alert and oriented. No focal neurological deficits. Extremities: Symmetric 5 x 5 power. Skin: No rashes, lesions or ulcers  Data Reviewed: I have personally reviewed following labs and imaging studies  CBC: Recent Labs  Lab 07/24/24 0217 07/25/24 0434 07/26/24 0212 07/27/24 0453 07/28/24 0217  WBC 13.6* 14.5* 14.5* 14.7* 14.9*  NEUTROABS 8.9* 10.2* 9.3* 10.0* 9.6*  HGB 8.6* 9.2* 8.4* 8.9* 8.4*  HCT 26.5* 28.6* 27.5* 29.2* 27.2*  MCV 76.1* 77.5* 79.5* 80.0 79.5*  PLT 410* 419* 386 393 349   Basic Metabolic Panel: Recent Labs  Lab 07/22/24 2221 07/22/24 2303 07/24/24 0217 07/25/24 0434 07/25/24 1432  NA 138 138 138 140 140  K 3.6 3.6 3.8 3.6 3.7  CL 105 106 105 108 106  CO2 18*  --  20* 21* 21*  GLUCOSE 98 95 93 100* 110*  BUN 15 14 15 9 13   CREATININE 0.83 0.80 0.88 0.91 0.87  CALCIUM  8.7*  --  8.2* 8.6* 8.8*   GFR: Estimated Creatinine Clearance: 40.1 mL/min (by C-G formula based on SCr of 0.87 mg/dL). Liver Function Tests: Recent Labs  Lab 07/22/24 2221 07/24/24 0217  AST 25 23  ALT 16 15  ALKPHOS 149* 141*  BILITOT 0.9 1.2  PROT 7.8 6.5  ALBUMIN 2.7* 2.3*   No results for input(s): LIPASE, AMYLASE in the last 168 hours. No results for input(s): AMMONIA in the last 168 hours. Coagulation Profile: Recent Labs  Lab 07/24/24 0217  INR 1.3*   Cardiac Enzymes: No results for input(s): CKTOTAL,  CKMB, CKMBINDEX, TROPONINI in the last 168 hours. BNP (last 3 results) No results for input(s): PROBNP in the last 8760 hours. HbA1C: No results for input(s): HGBA1C in the last 72 hours. CBG: Recent Labs  Lab 07/23/24 0042  GLUCAP 77   Lipid Profile: No results for input(s): CHOL, HDL, LDLCALC, TRIG, CHOLHDL, LDLDIRECT in the last 72 hours.  Thyroid Function Tests: No results for input(s): TSH, T4TOTAL, FREET4, T3FREE, THYROIDAB in the last 72 hours. Anemia Panel: No results for input(s): VITAMINB12, FOLATE, FERRITIN, TIBC, IRON, RETICCTPCT in the last 72 hours. Sepsis Labs: No results for input(s): PROCALCITON, LATICACIDVEN in the last 168 hours.  No results found for this or any previous visit (from the past 240 hours).   Radiology Studies: No results found.   Scheduled Meds:  apixaban   5 mg Oral BID   atorvastatin   20 mg Oral Daily   dorzolamide   1 drop Both Eyes TID   latanoprost   1 drop Both Eyes QHS   metoprolol   tartrate  12.5 mg Oral Daily   mirtazapine   7.5 mg Oral QHS   pantoprazole  (PROTONIX ) IV  40 mg Intravenous Q12H   predniSONE   5 mg Oral Daily   Continuous Infusions:   LOS: 5 days   Fredia Skeeter, MD [facility]  07/28/2024, 7:32 AM   *Please note that this is a verbal dictation therefore any spelling or grammatical errors are due to the Dragon Medical One system interpretation.  Please page via Amion and do not message via secure chat for urgent patient care matters. Secure chat can be used for non urgent patient care matters.  How to contact the TRH Attending or Consulting provider 7A - 7P or covering provider during after hours 7P -7A, for this patient?  Check the care team in [facility] and look for a) attending/consulting TRH provider listed and b) the TRH team listed. Page or secure chat 7A-7P. Log into [email or web address] and use [place]'s universal password to access. If you do not have the  password, please contact the hospital operator. Locate the TRH provider you are looking for under [facility] and page to a number that you can be directly reached. If you still have difficulty reaching the provider, please page the [facility] (Director on Call) for the Hospitalists listed on amion for assistance.

## 2024-07-28 NOTE — Progress Notes (Signed)
 Inpatient Rehab Admissions Coordinator:   Awaiting determination from [facility] regarding expedited appeal.  Will follow.   Reche Lowers, PT, DPT Admissions Coordinator [phone] 07/28/24  11:12 AM

## 2024-07-29 DIAGNOSIS — R4701 Aphasia: Secondary | ICD-10-CM | POA: Diagnosis not present

## 2024-07-29 LAB — CBC WITH DIFFERENTIAL/PLATELET
Abs Immature Granulocytes: 0.19 K/uL — ABNORMAL HIGH (ref 0.00–0.07)
Basophils Absolute: 0.1 K/uL (ref 0.0–0.1)
Basophils Relative: 1 %
Eosinophils Absolute: 0.2 K/uL (ref 0.0–0.5)
Eosinophils Relative: 1 %
HCT: 27.5 % — ABNORMAL LOW (ref 36.0–46.0)
Hemoglobin: 8.4 g/dL — ABNORMAL LOW (ref 12.0–15.0)
Immature Granulocytes: 1 %
Lymphocytes Relative: 18 %
Lymphs Abs: 2.7 K/uL (ref 0.7–4.0)
MCH: 24.2 pg — ABNORMAL LOW (ref 26.0–34.0)
MCHC: 30.5 g/dL (ref 30.0–36.0)
MCV: 79.3 fL — ABNORMAL LOW (ref 80.0–100.0)
Monocytes Absolute: 2.1 K/uL — ABNORMAL HIGH (ref 0.1–1.0)
Monocytes Relative: 14 %
Neutro Abs: 9.5 K/uL — ABNORMAL HIGH (ref 1.7–7.7)
Neutrophils Relative %: 65 %
Platelets: 367 K/uL (ref 150–400)
RBC: 3.47 MIL/uL — ABNORMAL LOW (ref 3.87–5.11)
RDW: 18 % — ABNORMAL HIGH (ref 11.5–15.5)
WBC: 14.7 K/uL — ABNORMAL HIGH (ref 4.0–10.5)
nRBC: 0 % (ref 0.0–0.2)

## 2024-07-29 NOTE — Plan of Care (Signed)
  Problem: Self-Care: Goal: Ability to participate in self-care as condition permits will improve Outcome: Progressing Goal: Verbalization of feelings and concerns over difficulty with self-care will improve Outcome: Progressing Goal: Ability to communicate needs accurately will improve Outcome: Progressing   Problem: Health Behavior/Discharge Planning: Goal: Ability to manage health-related needs will improve Outcome: Progressing   Problem: Clinical Measurements: Goal: Ability to maintain clinical measurements within normal limits will improve Outcome: Progressing Goal: Will remain free from infection Outcome: Progressing Goal: Diagnostic test results will improve Outcome: Progressing Goal: Respiratory complications will improve Outcome: Progressing Goal: Cardiovascular complication will be avoided Outcome: Progressing   Problem: Activity: Goal: Risk for activity intolerance will decrease Outcome: Progressing   Problem: Nutrition: Goal: Adequate nutrition will be maintained Outcome: Progressing   Problem: Elimination: Goal: Will not experience complications related to bowel motility Outcome: Progressing Goal: Will not experience complications related to urinary retention Outcome: Progressing   Problem: Pain Managment: Goal: General experience of comfort will improve and/or be controlled Outcome: Progressing   Problem: Safety: Goal: Ability to remain free from injury will improve Outcome: Progressing   Problem: Skin Integrity: Goal: Risk for impaired skin integrity will decrease Outcome: Progressing

## 2024-07-29 NOTE — Progress Notes (Signed)
 PROGRESS NOTE    Tiffany [name]  FMW:[id number] DOB: 03/14/1941 DOA: 07/22/2024 PCP: Benjamine Aland, MD   Brief Narrative:  Tiffany [name] is a 83 y.o. female with a PMHx of HTN, recently started on Eliquis  per report, presenting to the ED with garbled speech. Daughter brought her in for what was thought to be confusion and not acting normally. The patient was oriented only to her name on arrival. Labs revealed severe anemia with a Hgb of 6.1. CT head revealed a subacute left temporo-occipital ischemic infarct.  Admitted under hospitalist service, seen by neurology however currently awaiting placement to CIR.  Details below.   Assessment & Plan:   Principal Problem:   Aphasia Active Problems:   Stroke (cerebrum) (HCC)  Acute Ischemic Infarct:  left occipitotemporal infarct  Etiology: Embolic Code Stroke CT head- Acute/early subacute nonhemorrhagic left occipitotemporal infarct. There sulcal effacement but no  significant mass effect. Atrophy and small-vessel disease.  Otherwise stable exam. Patchy sclerosis and thickening of the squamous portion of the left temporal bone. No change since the 04/28/2024 CT, and appears to have been present on a 12/28/2021 MRI brain although not as well depicted. This could represent pagetoid changes of the bone or changes due to fibrous dysplasia, but a bone metastasis is not strictly excluded. 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 Occlusive thrombus in the small saphenous vein. 2D Echo shows EF of 60 to 65%, no diastolic dysfunction and no PFO. LDL 73 but goal<70-not on any antilipid.  Continue atorvastatin  20 mg. HgbA1c 5.2 VTE prophylaxis - SCDs Eliquis  (apixaban ) daily prior to admission, resumed eliquis . Therapy recommendations: CIR Disposition: CIR-pending-awaiting response for expedited appeal.  DVT Started on Eliquis  on 9/9   Anemia: Reportedly her anemia is  considered to be chronic.  She presented with hemoglobin of 5.6.  FOBT x 1 was negative.  GI was consulted and previous hospitalist Dr. Royal discussed with Dr. Stacia via secure chat who reviewed the chart and felt that work-up is low-yield without active bleed and neg Hemoccult, and would hold on anesthesia and GI work-up at this time given recent CVA.  Patient received 2 units of PRBC on 07/23/2024, patient's hemoglobin has remained stable over 8 ever since.   Hypertension: Blood pressure stable, patient on Lopressor  12.5 mg p.o. twice daily.   Dysphagia Seen by SLP, now on regular diet and tolerating.  Fever: Patient spiked fever of 101.2 today 07/29/2024 at 8 AM.  Patient has no complaints at all.  Denies urinary complaints or respiratory complaints and appears comfortable.  Unsure if this is true fever or an adder.  Will monitor off of antibiotics for now.  DVT prophylaxis: SCDs Start: 07/23/24 0800 SCDs Start: 07/23/24 0800   Code Status: Full Code  Family Communication:  None present at bedside.  Plan of care discussed with patient in length and he/she verbalized understanding and agreed with it.  Status is: Inpatient Remains inpatient appropriate because: Medically stable, pending placement, expedited appeal for denial in progress.   Estimated body mass index is 25.39 kg/m as calculated from the following:   Height as of this encounter: 5' (1.524 m).   Weight as of this encounter: 59 kg.    Nutritional Assessment: Body mass index is 25.39 kg/m.[name] Seen by dietician.  I agree with the assessment and plan as outlined below: Nutrition Status:        . Skin Assessment: I have  examined the patient's skin and I agree with the wound assessment as performed by the wound care RN as outlined below:    Consultants:  GI-curb sided Neurology-signed off  Procedures:  Above  Antimicrobials:  Anti-infectives (From admission, onward)    None          Subjective: Patient seen and examined.  She has no complaints at all.  Objective: Vitals:   07/28/24 1528 07/28/24 2058 07/28/24 2352 07/29/24 0410  BP: (!) 143/69 103/65 109/61 128/62  Pulse: 87 96 87 96  Resp: 16 17 19 17   Temp: 97.9 F (36.6 C) 99 F (37.2 C) 99.1 F (37.3 C) 99.9 F (37.7 C)  TempSrc: Oral Oral Oral Oral  SpO2: 98% 100% 98% 100%  Weight:      Height:       No intake or output data in the 24 hours ending 07/29/24 0808 Filed Weights   07/22/24 2205  Weight: 59 kg    Examination:  General exam: Appears calm and comfortable  Respiratory system: Clear to auscultation. Respiratory effort normal. Cardiovascular system: S1 & S2 heard, RRR. No JVD, murmurs, rubs, gallops or clicks. No pedal edema. Gastrointestinal system: Abdomen is nondistended, soft and nontender. No organomegaly or masses felt. Normal bowel sounds heard. Central nervous system: Alert and oriented x 2 today but very slow in response. No focal neurological deficits. Extremities: Symmetric 5 x 5 power. Skin: No rashes, lesions or ulcers  Data Reviewed: I have personally reviewed following labs and imaging studies  CBC: Recent Labs  Lab 07/25/24 0434 07/26/24 0212 07/27/24 0453 07/28/24 0217 07/29/24 0447  WBC 14.5* 14.5* 14.7* 14.9* 14.7*  NEUTROABS 10.2* 9.3* 10.0* 9.6* 9.5*  HGB 9.2* 8.4* 8.9* 8.4* 8.4*  HCT 28.6* 27.5* 29.2* 27.2* 27.5*  MCV 77.5* 79.5* 80.0 79.5* 79.3*  PLT 419* 386 393 349 367   Basic Metabolic Panel: Recent Labs  Lab 07/22/24 2221 07/22/24 2303 07/24/24 0217 07/25/24 0434 07/25/24 1432  NA 138 138 138 140 140  K 3.6 3.6 3.8 3.6 3.7  CL 105 106 105 108 106  CO2 18*  --  20* 21* 21*  GLUCOSE 98 95 93 100* 110*  BUN 15 14 15 9 13   CREATININE 0.83 0.80 0.88 0.91 0.87  CALCIUM  8.7*  --  8.2* 8.6* 8.8*   GFR: Estimated Creatinine Clearance: 40.1 mL/min (by C-G formula based on SCr of 0.87 mg/dL). Liver Function Tests: Recent Labs  Lab  07/22/24 2221 07/24/24 0217  AST 25 23  ALT 16 15  ALKPHOS 149* 141*  BILITOT 0.9 1.2  PROT 7.8 6.5  ALBUMIN 2.7* 2.3*   No results for input(s): LIPASE, AMYLASE in the last 168 hours. No results for input(s): AMMONIA in the last 168 hours. Coagulation Profile: Recent Labs  Lab 07/24/24 0217  INR 1.3*   Cardiac Enzymes: No results for input(s): CKTOTAL, CKMB, CKMBINDEX, TROPONINI in the last 168 hours. BNP (last 3 results) No results for input(s): PROBNP in the last 8760 hours. HbA1C: No results for input(s): HGBA1C in the last 72 hours. CBG: Recent Labs  Lab 07/23/24 0042  GLUCAP 77   Lipid Profile: No results for input(s): CHOL, HDL, LDLCALC, TRIG, CHOLHDL, LDLDIRECT in the last 72 hours.  Thyroid Function Tests: No results for input(s): TSH, T4TOTAL, FREET4, T3FREE, THYROIDAB in the last 72 hours. Anemia Panel: No results for input(s): VITAMINB12, FOLATE, FERRITIN, TIBC, IRON, RETICCTPCT in the last 72 hours. Sepsis Labs: No results for input(s): PROCALCITON, LATICACIDVEN in the  last 168 hours.  No results found for this or any previous visit (from the past 240 hours).   Radiology Studies: No results found.   Scheduled Meds:  apixaban   5 mg Oral BID   atorvastatin   20 mg Oral Daily   dorzolamide   1 drop Both Eyes TID   latanoprost   1 drop Both Eyes QHS   metoprolol  tartrate  12.5 mg Oral Daily   mirtazapine   7.5 mg Oral QHS   pantoprazole  (PROTONIX ) IV  40 mg Intravenous Q12H   predniSONE   5 mg Oral Daily   Continuous Infusions:   LOS: 6 days   Fredia Skeeter, MD [facility]  07/29/2024, 8:08 AM   *Please note that this is a verbal dictation therefore any spelling or grammatical errors are due to the Dragon Medical One system interpretation.  Please page via Amion and do not message via secure chat for urgent patient care matters. Secure chat can be used for non urgent patient care  matters.  How to contact the TRH Attending or Consulting provider 7A - 7P or covering provider during after hours 7P -7A, for this patient?  Check the care team in [facility] and look for a) attending/consulting TRH provider listed and b) the TRH team listed. Page or secure chat 7A-7P. Log into [email or web address] and use [place]'s universal password to access. If you do not have the password, please contact the hospital operator. Locate the TRH provider you are looking for under [facility] and page to a number that you can be directly reached. If you still have difficulty reaching the provider, please page the [facility] (Director on Call) for the Hospitalists listed on amion for assistance.

## 2024-07-30 DIAGNOSIS — R4701 Aphasia: Secondary | ICD-10-CM | POA: Diagnosis not present

## 2024-07-30 LAB — CBC WITH DIFFERENTIAL/PLATELET
Abs Immature Granulocytes: 0.22 K/uL — ABNORMAL HIGH (ref 0.00–0.07)
Basophils Absolute: 0.1 K/uL (ref 0.0–0.1)
Basophils Relative: 0 %
Eosinophils Absolute: 0.3 K/uL (ref 0.0–0.5)
Eosinophils Relative: 2 %
HCT: 27.6 % — ABNORMAL LOW (ref 36.0–46.0)
Hemoglobin: 8.7 g/dL — ABNORMAL LOW (ref 12.0–15.0)
Immature Granulocytes: 1 %
Lymphocytes Relative: 16 %
Lymphs Abs: 2.6 K/uL (ref 0.7–4.0)
MCH: 24.6 pg — ABNORMAL LOW (ref 26.0–34.0)
MCHC: 31.5 g/dL (ref 30.0–36.0)
MCV: 78 fL — ABNORMAL LOW (ref 80.0–100.0)
Monocytes Absolute: 2.3 K/uL — ABNORMAL HIGH (ref 0.1–1.0)
Monocytes Relative: 14 %
Neutro Abs: 10.8 K/uL — ABNORMAL HIGH (ref 1.7–7.7)
Neutrophils Relative %: 67 %
Platelets: 390 K/uL (ref 150–400)
RBC: 3.54 MIL/uL — ABNORMAL LOW (ref 3.87–5.11)
RDW: 18.1 % — ABNORMAL HIGH (ref 11.5–15.5)
WBC: 16.3 K/uL — ABNORMAL HIGH (ref 4.0–10.5)
nRBC: 0 % (ref 0.0–0.2)

## 2024-07-30 LAB — BASIC METABOLIC PANEL WITH GFR
Anion gap: 13 (ref 5–15)
BUN: 23 mg/dL (ref 8–23)
CO2: 19 mmol/L — ABNORMAL LOW (ref 22–32)
Calcium: 8.6 mg/dL — ABNORMAL LOW (ref 8.9–10.3)
Chloride: 106 mmol/L (ref 98–111)
Creatinine, Ser: 0.9 mg/dL (ref 0.44–1.00)
GFR, Estimated: >60 mL/min (ref >60)
Glucose, Bld: 102 mg/dL — ABNORMAL HIGH (ref 70–99)
Potassium: 3.7 mmol/L (ref 3.5–5.1)
Sodium: 138 mmol/L (ref 135–145)

## 2024-07-30 MED ORDER — BISACODYL 10 MG RE SUPP
10.0000 mg | Freq: Once | RECTAL | Status: AC
Start: 2024-07-30 — End: 2024-07-30
  Administered 2024-07-30 (×2): 10 mg via RECTAL
  Filled 2024-07-30: qty 1

## 2024-07-30 MED ORDER — METOPROLOL TARTRATE 25 MG PO TABS
25.0000 mg | ORAL_TABLET | Freq: Every day | ORAL | Status: DC
  Administered 2024-07-30 – 2024-07-31 (×3): 25 mg via ORAL
  Filled 2024-07-30 (×2): qty 1

## 2024-07-30 MED ORDER — SODIUM CHLORIDE 0.9 % IV BOLUS
500.0000 mL | Freq: Once | INTRAVENOUS | Status: AC
  Administered 2024-07-30 (×2): 500 mL via INTRAVENOUS

## 2024-07-30 MED ORDER — SODIUM CHLORIDE 0.9 % IV BOLUS
250.0000 mL | Freq: Once | INTRAVENOUS | Status: AC
  Administered 2024-07-30 (×2): 250 mL via INTRAVENOUS

## 2024-07-30 NOTE — Progress Notes (Signed)
   07/30/24 0402  Vitals  Temp 99.6 F (37.6 C)  Temp Source Oral  BP 117/64  MAP (mmHg) 79  BP Location Left Arm  BP Method Automatic  Patient Position (if appropriate) Lying  Pulse Rate (!) 126  Pulse Rate Source Dinamap  Resp 18  Level of Consciousness  Level of Consciousness Alert  MEWS COLOR  MEWS Score Color Yellow  Oxygen Therapy  SpO2 98 %  O2 Device Room Air  Pain Assessment  Pain Scale 0-10  Pain Score 0  MEWS Score  MEWS Temp 0  MEWS Systolic 0  MEWS Pulse 2  MEWS RR 0  MEWS LOC 0  MEWS Score 2  Provider Notification  Provider Name/Title Dr. Camila Ned, MD  Date Provider Notified 07/30/24  Time Provider Notified 0405  Method of Notification Page  Notification Reason Other (Comment) (HR= 126-127's, not in pain. BP = 117/64, temp=99.6, Saturation =98% on room air)  Provider response See new orders  Date of Provider Response 07/30/24  Time of Provider Response 0411   EKG done and informed Dr. CANDIE Ned, MD at [phone]

## 2024-07-30 NOTE — Progress Notes (Signed)
 Called for elevated hr HR= 126-127s asymptomatic.  Will check EKG H/h and lytes.  Trial of ns bolus x 1.  Await labs for further plan.  Primary attn to follow.

## 2024-07-30 NOTE — Plan of Care (Signed)
  Problem: Self-Care: Goal: Ability to participate in self-care as condition permits will improve Outcome: Progressing Goal: Verbalization of feelings and concerns over difficulty with self-care will improve Outcome: Progressing Goal: Ability to communicate needs accurately will improve Outcome: Progressing   Problem: Nutrition: Goal: Risk of aspiration will decrease Outcome: Progressing Goal: Dietary intake will improve Outcome: Progressing   Problem: Clinical Measurements: Goal: Ability to maintain clinical measurements within normal limits will improve Outcome: Progressing Goal: Will remain free from infection Outcome: Progressing Goal: Diagnostic test results will improve Outcome: Progressing Goal: Respiratory complications will improve Outcome: Progressing Goal: Cardiovascular complication will be avoided Outcome: Progressing   Problem: Activity: Goal: Risk for activity intolerance will decrease Outcome: Progressing   Problem: Nutrition: Goal: Adequate nutrition will be maintained Outcome: Progressing   Problem: Coping: Goal: Level of anxiety will decrease Outcome: Progressing   Problem: Pain Managment: Goal: General experience of comfort will improve and/or be controlled Outcome: Progressing   Problem: Elimination: Goal: Will not experience complications related to bowel motility Outcome: Progressing Goal: Will not experience complications related to urinary retention Outcome: Progressing   Problem: Safety: Goal: Ability to remain free from injury will improve Outcome: Progressing   Problem: Skin Integrity: Goal: Risk for impaired skin integrity will decrease Outcome: Progressing

## 2024-07-30 NOTE — Plan of Care (Signed)
  Problem: Ischemic Stroke/TIA Tissue Perfusion: Goal: Complications of ischemic stroke/TIA will be minimized 07/30/2024 0631 by Gaetana Randall Mathew GORMAN, RN Outcome: Progressing 07/30/2024 0530 by Gaetana Randall Mathew GORMAN, RN Outcome: Progressing   Problem: Education: Goal: Knowledge of disease or condition will improve 07/30/2024 0631 by Gaetana Randall Mathew GORMAN, RN Outcome: Progressing 07/30/2024 0530 by Gaetana Randall Mathew GORMAN, RN Outcome: Progressing Goal: Knowledge of secondary prevention will improve (MUST DOCUMENT ALL) 07/30/2024 0631 by Gaetana Randall Mathew GORMAN, RN Outcome: Progressing 07/30/2024 0530 by Gaetana Randall Mathew GORMAN, RN Outcome: Progressing Goal: Knowledge of patient specific risk factors will improve (DELETE if not current risk factor) 07/30/2024 0631 by Gaetana Randall Mathew GORMAN, RN Outcome: Progressing 07/30/2024 0530 by Gaetana Randall Mathew GORMAN, RN Outcome: Progressing   Problem: Health Behavior/Discharge Planning: Goal: Ability to manage health-related needs will improve 07/30/2024 0631 by Gaetana Randall Mathew GORMAN, RN Outcome: Progressing 07/30/2024 0530 by Gaetana Randall Mathew GORMAN, RN Outcome: Progressing Goal: Goals will be collaboratively established with patient/family 07/30/2024 0631 by Gaetana Randall Mathew GORMAN, RN Outcome: Progressing 07/30/2024 0530 by Gaetana Randall Mathew GORMAN, RN Outcome: Progressing   Problem: Coping: Goal: Will verbalize positive feelings about self 07/30/2024 0631 by Gaetana Randall Mathew GORMAN, RN Outcome: Progressing 07/30/2024 0530 by Gaetana Randall Mathew GORMAN, RN Outcome: Progressing Goal: Will identify appropriate support needs 07/30/2024 0631 by Gaetana Randall Mathew GORMAN, RN Outcome: Progressing 07/30/2024 0530 by Gaetana Randall Mathew GORMAN, RN Outcome: Progressing

## 2024-07-30 NOTE — Progress Notes (Signed)
 PROGRESS NOTE    Tiffany [name]  FMW:[id number] DOB: 08-01-1941 DOA: 07/22/2024 PCP: Benjamine Aland, MD   Brief Narrative:  Tiffany [name] is a 83 y.o. female with a PMHx of HTN, recently started on Eliquis  per report, presenting to the ED with garbled speech. Daughter brought her in for what was thought to be confusion and not acting normally. The patient was oriented only to her name on arrival. Labs revealed severe anemia with a Hgb of 6.1. CT head revealed a subacute left temporo-occipital ischemic infarct.  Admitted under hospitalist service, seen by neurology however currently awaiting placement to CIR.  Details below.   Assessment & Plan:   Principal Problem:   Aphasia Active Problems:   Stroke (cerebrum) (HCC)  Acute Ischemic Infarct:  left occipitotemporal infarct  Etiology: Embolic Code Stroke CT head- Acute/early subacute nonhemorrhagic left occipitotemporal infarct. There sulcal effacement but no  significant mass effect. Atrophy and small-vessel disease.  Otherwise stable exam. Patchy sclerosis and thickening of the squamous portion of the left temporal bone. No change since the 04/28/2024 CT, and appears to have been present on a 12/28/2021 MRI brain although not as well depicted. This could represent pagetoid changes of the bone or changes due to fibrous dysplasia, but a bone metastasis is not strictly excluded. 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 Occlusive thrombus in the small saphenous vein. 2D Echo shows EF of 60 to 65%, no diastolic dysfunction and no PFO. LDL 73 but goal<70-not on any antilipid.  Continue atorvastatin  20 mg. HgbA1c 5.2 VTE prophylaxis - SCDs Eliquis  (apixaban ) daily prior to admission, resumed eliquis . Therapy recommendations: CIR Disposition: CIR-pending-awaiting response for expedited appeal.  DVT Started on Eliquis  on 9/9   Anemia: Reportedly her anemia is  considered to be chronic.  She presented with hemoglobin of 5.6.  FOBT x 1 was negative.  GI was consulted and previous hospitalist Dr. Royal discussed with Dr. Stacia via secure chat who reviewed the chart and felt that work-up is low-yield without active bleed and neg Hemoccult, and would hold on anesthesia and GI work-up at this time given recent CVA.  Patient received 2 units of PRBC on 07/23/2024, patient's hemoglobin has remained stable over 8 ever since.   Hypertension/sinus tachycardia: Blood pressure stable however patient has intermittent sinus tachycardia, patient on Lopressor  12.5 mg p.o. twice daily, will increase that to 25 mg p.o. twice daily.   Dysphagia Seen by SLP, now on regular diet and tolerating.  Fever: Patient spiked fever of 101.2 07/29/2024 at 8 AM.  Patient had no complaints at all.  Denied urinary complaints or respiratory complaints and appears comfortable.  Unsure if this is true fever or an adder.  Patient is afebrile ever since.  Will monitor off of antibiotics for now.  DVT prophylaxis: SCDs Start: 07/23/24 0800 SCDs Start: 07/23/24 0800   Code Status: Full Code  Family Communication:  None present at bedside.  Plan of care discussed with patient in length and he/she verbalized understanding and agreed with it.  Status is: Inpatient Remains inpatient appropriate because: Medically stable, pending placement, expedited appeal for denial in progress.   Estimated body mass index is 25.39 kg/m as calculated from the following:   Height as of this encounter: 5' (1.524 m).   Weight as of this encounter: 59 kg.    Nutritional Assessment: Body mass index is 25.39 kg/m.[name] Seen by dietician.  I agree with  the assessment and plan as outlined below: Nutrition Status:        . Skin Assessment: I have examined the patient's skin and I agree with the wound assessment as performed by the wound care RN as outlined below:    Consultants:  GI-curb  sided Neurology-signed off  Procedures:  Above  Antimicrobials:  Anti-infectives (From admission, onward)    None         Subjective: Patient seen and examined.  Alert and oriented.  No complaints.  RN at the bedside.  Still awaiting placement.  Objective: Vitals:   07/30/24 0350 07/30/24 0402 07/30/24 0602 07/30/24 0738  BP: 135/64 117/64 (!) 111/54 112/61  Pulse:  (!) 126 (!) 121 (!) 112  Resp: 17 18  16   Temp: 99.6 F (37.6 C) 99.6 F (37.6 C) 99 F (37.2 C) 99.9 F (37.7 C)  TempSrc: Oral Oral Oral Oral  SpO2: 97% 98% 98% 99%  Weight:      Height:        Intake/Output Summary (Last 24 hours) at 07/30/2024 0745 Last data filed at 07/30/2024 0545 Gross per 24 hour  Intake 250 ml  Output --  Net 250 ml   Filed Weights   07/22/24 2205  Weight: 59 kg    Examination:  General exam: Appears calm and comfortable  Respiratory system: Clear to auscultation. Respiratory effort normal. Cardiovascular system: S1 & S2 heard, RRR. No JVD, murmurs, rubs, gallops or clicks. No pedal edema. Gastrointestinal system: Abdomen is nondistended, soft and nontender. No organomegaly or masses felt. Normal bowel sounds heard. Central nervous system: Alert and oriented x 2 today but very slow in response. No focal neurological deficits. Extremities: Symmetric 5 x 5 power. Skin: No rashes, lesions or ulcers  Data Reviewed: I have personally reviewed following labs and imaging studies  CBC: Recent Labs  Lab 07/26/24 0212 07/27/24 0453 07/28/24 0217 07/29/24 0447 07/30/24 0454  WBC 14.5* 14.7* 14.9* 14.7* 16.3*  NEUTROABS 9.3* 10.0* 9.6* 9.5* 10.8*  HGB 8.4* 8.9* 8.4* 8.4* 8.7*  HCT 27.5* 29.2* 27.2* 27.5* 27.6*  MCV 79.5* 80.0 79.5* 79.3* 78.0*  PLT 386 393 349 367 390   Basic Metabolic Panel: Recent Labs  Lab 07/24/24 0217 07/25/24 0434 07/25/24 1432 07/30/24 0454  NA 138 140 140 138  K 3.8 3.6 3.7 3.7  CL 105 108 106 106  CO2 20* 21* 21* 19*  GLUCOSE 93 100*  110* 102*  BUN 15 9 13 23   CREATININE 0.88 0.91 0.87 0.90  CALCIUM  8.2* 8.6* 8.8* 8.6*   GFR: Estimated Creatinine Clearance: 38.7 mL/min (by C-G formula based on SCr of 0.9 mg/dL). Liver Function Tests: Recent Labs  Lab 07/24/24 0217  AST 23  ALT 15  ALKPHOS 141*  BILITOT 1.2  PROT 6.5  ALBUMIN 2.3*   No results for input(s): LIPASE, AMYLASE in the last 168 hours. No results for input(s): AMMONIA in the last 168 hours. Coagulation Profile: Recent Labs  Lab 07/24/24 0217  INR 1.3*   Cardiac Enzymes: No results for input(s): CKTOTAL, CKMB, CKMBINDEX, TROPONINI in the last 168 hours. BNP (last 3 results) No results for input(s): PROBNP in the last 8760 hours. HbA1C: No results for input(s): HGBA1C in the last 72 hours. CBG: No results for input(s): GLUCAP in the last 168 hours.  Lipid Profile: No results for input(s): CHOL, HDL, LDLCALC, TRIG, CHOLHDL, LDLDIRECT in the last 72 hours.  Thyroid Function Tests: No results for input(s): TSH, T4TOTAL, FREET4, T3FREE, THYROIDAB in  the last 72 hours. Anemia Panel: No results for input(s): VITAMINB12, FOLATE, FERRITIN, TIBC, IRON, RETICCTPCT in the last 72 hours. Sepsis Labs: No results for input(s): PROCALCITON, LATICACIDVEN in the last 168 hours.  No results found for this or any previous visit (from the past 240 hours).   Radiology Studies: No results found.   Scheduled Meds:  apixaban   5 mg Oral BID   atorvastatin   20 mg Oral Daily   dorzolamide   1 drop Both Eyes TID   latanoprost   1 drop Both Eyes QHS   metoprolol  tartrate  25 mg Oral Daily   mirtazapine   7.5 mg Oral QHS   pantoprazole  (PROTONIX ) IV  40 mg Intravenous Q12H   predniSONE   5 mg Oral Daily   Continuous Infusions:   LOS: 7 days   Fredia Skeeter, MD [facility]  07/30/2024, 7:45 AM   *Please note that this is a verbal dictation therefore any spelling or grammatical errors are due  to the [employer] One system interpretation.  Please page via Amion and do not message via secure chat for urgent patient care matters. Secure chat can be used for non urgent patient care matters.  How to contact the [facility] Attending or Consulting provider 7A - 7P or covering provider during after hours 7P -7A, for this patient?  Check the care team in [facility] and look for a) attending/consulting [facility] provider listed and b) the [facility] team listed. Page or secure chat 7A-7P. Log into [email or web address] and use [place]'s universal password to access. If you do not have the password, please contact the hospital operator. Locate the [facility] provider you are looking for under [facility] and page to a number that you can be directly reached. If you still have difficulty reaching the provider, please page the [facility] (Director on Call) for the Hospitalists listed on amion for assistance.

## 2024-07-31 ENCOUNTER — Inpatient Hospital Stay (HOSPITAL_COMMUNITY)
Admission: AD | Admit: 2024-07-31 | Discharge: 2024-08-11 | DRG: 056 | Disposition: A | Discharge status: Home / Self Care | Source: Intra-hospital | Attending: Physical Medicine and Rehabilitation | Admitting: Physical Medicine and Rehabilitation

## 2024-07-31 ENCOUNTER — Other Ambulatory Visit: Payer: Self-pay

## 2024-07-31 ENCOUNTER — Encounter: Payer: Self-pay | Admitting: Physical Medicine and Rehabilitation

## 2024-07-31 ENCOUNTER — Other Ambulatory Visit (HOSPITAL_COMMUNITY): Payer: Self-pay

## 2024-07-31 ENCOUNTER — Encounter (HOSPITAL_COMMUNITY): Payer: Self-pay | Admitting: Physical Medicine and Rehabilitation

## 2024-07-31 DIAGNOSIS — Z91013 Allergy to seafood: Secondary | ICD-10-CM

## 2024-07-31 DIAGNOSIS — C787 Secondary malignant neoplasm of liver and intrahepatic bile duct: Secondary | ICD-10-CM | POA: Diagnosis present

## 2024-07-31 DIAGNOSIS — I2510 Atherosclerotic heart disease of native coronary artery without angina pectoris: Secondary | ICD-10-CM | POA: Diagnosis present

## 2024-07-31 DIAGNOSIS — I69320 Aphasia following cerebral infarction: Secondary | ICD-10-CM

## 2024-07-31 DIAGNOSIS — Z7983 Long term (current) use of bisphosphonates: Secondary | ICD-10-CM

## 2024-07-31 DIAGNOSIS — D75839 Thrombocytosis, unspecified: Secondary | ICD-10-CM | POA: Diagnosis not present

## 2024-07-31 DIAGNOSIS — I69398 Other sequelae of cerebral infarction: Principal | ICD-10-CM

## 2024-07-31 DIAGNOSIS — Z5320 Procedure and treatment not carried out because of patient's decision for unspecified reasons: Secondary | ICD-10-CM | POA: Diagnosis not present

## 2024-07-31 DIAGNOSIS — D649 Anemia, unspecified: Secondary | ICD-10-CM | POA: Diagnosis not present

## 2024-07-31 DIAGNOSIS — D630 Anemia in neoplastic disease: Secondary | ICD-10-CM | POA: Diagnosis present

## 2024-07-31 DIAGNOSIS — Z79899 Other long term (current) drug therapy: Secondary | ICD-10-CM

## 2024-07-31 DIAGNOSIS — I82451 Acute embolism and thrombosis of right peroneal vein: Secondary | ICD-10-CM | POA: Diagnosis present

## 2024-07-31 DIAGNOSIS — C169 Malignant neoplasm of stomach, unspecified: Secondary | ICD-10-CM | POA: Diagnosis present

## 2024-07-31 DIAGNOSIS — R933 Abnormal findings on diagnostic imaging of other parts of digestive tract: Secondary | ICD-10-CM | POA: Diagnosis not present

## 2024-07-31 DIAGNOSIS — D509 Iron deficiency anemia, unspecified: Secondary | ICD-10-CM | POA: Diagnosis present

## 2024-07-31 DIAGNOSIS — Z9189 Other specified personal risk factors, not elsewhere classified: Secondary | ICD-10-CM

## 2024-07-31 DIAGNOSIS — K5900 Constipation, unspecified: Secondary | ICD-10-CM | POA: Diagnosis not present

## 2024-07-31 DIAGNOSIS — Z7952 Long term (current) use of systemic steroids: Secondary | ICD-10-CM | POA: Diagnosis not present

## 2024-07-31 DIAGNOSIS — R188 Other ascites: Secondary | ICD-10-CM | POA: Diagnosis present

## 2024-07-31 DIAGNOSIS — R000 Tachycardia, unspecified: Secondary | ICD-10-CM | POA: Diagnosis present

## 2024-07-31 DIAGNOSIS — K3189 Other diseases of stomach and duodenum: Secondary | ICD-10-CM | POA: Diagnosis not present

## 2024-07-31 DIAGNOSIS — I82431 Acute embolism and thrombosis of right popliteal vein: Secondary | ICD-10-CM | POA: Diagnosis not present

## 2024-07-31 DIAGNOSIS — R599 Enlarged lymph nodes, unspecified: Secondary | ICD-10-CM | POA: Diagnosis present

## 2024-07-31 DIAGNOSIS — I119 Hypertensive heart disease without heart failure: Secondary | ICD-10-CM | POA: Diagnosis present

## 2024-07-31 DIAGNOSIS — K7689 Other specified diseases of liver: Secondary | ICD-10-CM | POA: Diagnosis not present

## 2024-07-31 DIAGNOSIS — I2693 Single subsegmental pulmonary embolism without acute cor pulmonale: Secondary | ICD-10-CM | POA: Diagnosis present

## 2024-07-31 DIAGNOSIS — I82401 Acute embolism and thrombosis of unspecified deep veins of right lower extremity: Secondary | ICD-10-CM | POA: Diagnosis not present

## 2024-07-31 DIAGNOSIS — F09 Unspecified mental disorder due to known physiological condition: Secondary | ICD-10-CM | POA: Diagnosis not present

## 2024-07-31 DIAGNOSIS — C786 Secondary malignant neoplasm of retroperitoneum and peritoneum: Secondary | ICD-10-CM | POA: Diagnosis present

## 2024-07-31 DIAGNOSIS — D72829 Elevated white blood cell count, unspecified: Secondary | ICD-10-CM | POA: Diagnosis present

## 2024-07-31 DIAGNOSIS — R0989 Other specified symptoms and signs involving the circulatory and respiratory systems: Secondary | ICD-10-CM | POA: Diagnosis not present

## 2024-07-31 DIAGNOSIS — Z86718 Personal history of other venous thrombosis and embolism: Secondary | ICD-10-CM

## 2024-07-31 DIAGNOSIS — Z803 Family history of malignant neoplasm of breast: Secondary | ICD-10-CM | POA: Diagnosis not present

## 2024-07-31 DIAGNOSIS — I2699 Other pulmonary embolism without acute cor pulmonale: Secondary | ICD-10-CM | POA: Diagnosis not present

## 2024-07-31 DIAGNOSIS — R7303 Prediabetes: Secondary | ICD-10-CM | POA: Diagnosis present

## 2024-07-31 DIAGNOSIS — D376 Neoplasm of uncertain behavior of liver, gallbladder and bile ducts: Secondary | ICD-10-CM | POA: Diagnosis not present

## 2024-07-31 DIAGNOSIS — Z85028 Personal history of other malignant neoplasm of stomach: Secondary | ICD-10-CM

## 2024-07-31 DIAGNOSIS — D62 Acute posthemorrhagic anemia: Secondary | ICD-10-CM

## 2024-07-31 DIAGNOSIS — H409 Unspecified glaucoma: Secondary | ICD-10-CM | POA: Diagnosis present

## 2024-07-31 DIAGNOSIS — D6859 Other primary thrombophilia: Secondary | ICD-10-CM | POA: Diagnosis present

## 2024-07-31 DIAGNOSIS — I63512 Cerebral infarction due to unspecified occlusion or stenosis of left middle cerebral artery: Principal | ICD-10-CM | POA: Diagnosis present

## 2024-07-31 DIAGNOSIS — Z7901 Long term (current) use of anticoagulants: Secondary | ICD-10-CM

## 2024-07-31 DIAGNOSIS — R4701 Aphasia: Secondary | ICD-10-CM | POA: Diagnosis not present

## 2024-07-31 DIAGNOSIS — K769 Liver disease, unspecified: Secondary | ICD-10-CM | POA: Diagnosis not present

## 2024-07-31 DIAGNOSIS — C801 Malignant (primary) neoplasm, unspecified: Secondary | ICD-10-CM | POA: Diagnosis not present

## 2024-07-31 DIAGNOSIS — I6782 Cerebral ischemia: Secondary | ICD-10-CM | POA: Diagnosis not present

## 2024-07-31 DIAGNOSIS — I700 Atherosclerosis of aorta: Secondary | ICD-10-CM | POA: Diagnosis present

## 2024-07-31 MED ORDER — FE FUM-VIT C-VIT B12-FA 460-60-0.01-1 MG PO CAPS
1.0000 | ORAL_CAPSULE | Freq: Every day | ORAL | Status: DC
  Administered 2024-07-31 – 2024-08-02 (×3): 1 via ORAL
  Filled 2024-07-31 (×2): qty 1

## 2024-07-31 MED ORDER — PANTOPRAZOLE SODIUM 40 MG PO TBEC
40.0000 mg | DELAYED_RELEASE_TABLET | Freq: Two times a day (BID) | ORAL | Status: DC
  Administered 2024-07-31: 40 mg via ORAL
  Filled 2024-07-31: qty 1

## 2024-07-31 MED ORDER — ATORVASTATIN CALCIUM 10 MG PO TABS
20.0000 mg | ORAL_TABLET | Freq: Every day | ORAL | Status: DC
  Administered 2024-08-01 – 2024-08-11 (×11): 20 mg via ORAL
  Filled 2024-07-31 (×12): qty 2

## 2024-07-31 MED ORDER — DORZOLAMIDE HCL 2 % OP SOLN
1.0000 [drp] | Freq: Three times a day (TID) | OPHTHALMIC | Status: DC
  Administered 2024-07-31 – 2024-08-11 (×31): 1 [drp] via OPHTHALMIC
  Filled 2024-07-31: qty 10

## 2024-07-31 MED ORDER — MIRTAZAPINE 15 MG PO TABS
7.5000 mg | ORAL_TABLET | Freq: Every day | ORAL | Status: DC
Start: 2024-07-31 — End: 2024-08-03
  Administered 2024-07-31 – 2024-08-02 (×3): 7.5 mg via ORAL
  Filled 2024-07-31 (×3): qty 1

## 2024-07-31 MED ORDER — PROCHLORPERAZINE 25 MG RE SUPP: 12.5000 mg | Freq: Four times a day (QID) | RECTAL | Status: DC | PRN

## 2024-07-31 MED ORDER — MELATONIN 5 MG PO TABS
5.0000 mg | ORAL_TABLET | Freq: Every evening | ORAL | Status: DC | PRN
  Administered 2024-08-03: 5 mg via ORAL
  Filled 2024-07-31: qty 1

## 2024-07-31 MED ORDER — APIXABAN 5 MG PO TABS
5.0000 mg | ORAL_TABLET | Freq: Two times a day (BID) | ORAL | Status: DC
Start: 2024-07-31 — End: 2024-08-01
  Administered 2024-07-31 – 2024-08-01 (×2): 5 mg via ORAL
  Filled 2024-07-31 (×2): qty 1

## 2024-07-31 MED ORDER — PANTOPRAZOLE SODIUM 40 MG PO TBEC
40.0000 mg | DELAYED_RELEASE_TABLET | Freq: Two times a day (BID) | ORAL | Status: DC
  Administered 2024-07-31 – 2024-08-11 (×22): 40 mg via ORAL
  Filled 2024-07-31 (×22): qty 1

## 2024-07-31 MED ORDER — DIPHENHYDRAMINE HCL 25 MG PO CAPS: 25.0000 mg | ORAL_CAPSULE | Freq: Four times a day (QID) | ORAL | Status: DC | PRN

## 2024-07-31 MED ORDER — PROCHLORPERAZINE EDISYLATE 10 MG/2ML IJ SOLN: 5.0000 mg | Freq: Four times a day (QID) | INTRAMUSCULAR | Status: DC | PRN

## 2024-07-31 MED ORDER — METOPROLOL TARTRATE 25 MG PO TABS
25.0000 mg | ORAL_TABLET | Freq: Every day | ORAL | Status: DC
Start: 2024-08-01 — End: 2024-08-11
  Administered 2024-08-01 – 2024-08-11 (×11): 25 mg via ORAL
  Filled 2024-07-31 (×11): qty 1

## 2024-07-31 MED ORDER — ALUM & MAG HYDROXIDE-SIMETH 200-200-20 MG/5ML PO SUSP: 30.0000 mL | ORAL | Status: DC | PRN

## 2024-07-31 MED ORDER — FLEET ENEMA RE ENEM: 1.0000 | ENEMA | Freq: Once | RECTAL | Status: DC | PRN

## 2024-07-31 MED ORDER — GUAIFENESIN-DM 100-10 MG/5ML PO SYRP: 5.0000 mL | ORAL_SOLUTION | Freq: Four times a day (QID) | ORAL | Status: DC | PRN

## 2024-07-31 MED ORDER — ACETAMINOPHEN 325 MG PO TABS: 325.0000 mg | ORAL_TABLET | ORAL | Status: DC | PRN

## 2024-07-31 MED ORDER — BISACODYL 10 MG RE SUPP: 10.0000 mg | Freq: Every day | RECTAL | Status: DC | PRN

## 2024-07-31 MED ORDER — LATANOPROST 0.005 % OP SOLN
1.0000 [drp] | Freq: Every day | OPHTHALMIC | Status: DC
  Administered 2024-07-31 – 2024-08-10 (×11): 1 [drp] via OPHTHALMIC
  Filled 2024-07-31: qty 2.5

## 2024-07-31 MED ORDER — ATORVASTATIN CALCIUM 20 MG PO TABS
20.0000 mg | ORAL_TABLET | Freq: Every day | ORAL | 0 refills | Status: DC
  Filled 2024-07-31: qty 30, 30d supply, fill #0

## 2024-07-31 MED ORDER — PREDNISONE 5 MG PO TABS
5.0000 mg | ORAL_TABLET | Freq: Every day | ORAL | Status: DC
  Administered 2024-08-01 – 2024-08-11 (×11): 5 mg via ORAL
  Filled 2024-07-31 (×12): qty 1

## 2024-07-31 MED ORDER — PROCHLORPERAZINE MALEATE 5 MG PO TABS: 5.0000 mg | ORAL_TABLET | Freq: Four times a day (QID) | ORAL | Status: DC | PRN

## 2024-07-31 NOTE — Progress Notes (Signed)
 Babs Arthea DASEN, MD  Physician Physical Medicine and Rehabilitation   Consult Note    Signed   Date of Service: 07/25/2024 12:11 PM  Related encounter: ED to Hosp-Admission (Discharged) from 07/22/2024 in [facility]   Signed     Expand All Collapse All           Physical Medicine and Rehabilitation Consult Reason for Consult:Impaired functional mobility, confusion Referring Physician: Juvenal     HPI: Tiffany [name] is a 83 y.o. female of [place] descent with a history of HTN, DVT (on eliquis ) who was amitted on 9/20 for garbled speech. 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 CTA demonstrated left MCA, M3-M4 branch occlusion. MRI completed and reveals no significant change in left MCA infarct as demonstrated on CT.  Hgb was down to 5.6 on admit. Pt transfused 2u PRBC and GI consulted and recommended outpt colonoscopy. No active bleeding found. Hgb stabilizing. Eliquis  was resumed 9/22. Bp control without scheduled meds currently. Pt on regular diet currently.  Pt was up with therapies and was contact guard assist for sit-std transfers and walked 100' min to mod assist. Basic ADL's performed at min-mod assist level. Pt was independent and driving prior to admit. She plans to discharge home whom she lives with. House has 5 steps to enter.        Home: Home Living Family/patient expects to be discharged to:: Private residence Living Arrangements: Children (daughter) Available Help at Discharge: Family Type of Home: House Home Access: Stairs to enter Secretary/administrator of Steps: 5 STE Entrance Stairs-Rails: None (wall on one side) Home Layout: Able to live on main level with bedroom/bathroom Bathroom Shower/Tub: Health visitor: Standard Home Equipment: None Additional Comments: Pt's daughter works Museum/gallery exhibitions officer; picks up weekday hours occasionally. Pt SIL is home Saturday and Sunday.  Pt sister who lives in [place] is willing to come and help out.  Functional History: Prior Function Prior Level of Function : Independent/Modified Independent, Driving Mobility Comments: pt flew from [facility] by herself 2 weeks ago PTA ADLs Comments: Pt enjoys sewing; has a pillbox but does not use it Functional Status:  Mobility: Bed Mobility Overal bed mobility: Needs Assistance Bed Mobility: Supine to Sit, Sit to Supine Supine to sit: Supervision Sit to supine: Supervision, HOB elevated General bed mobility comments: Cues to initiate return to supine at end of session Transfers Overall transfer level: Needs assistance Equipment used: None Transfers: Sit to/from Stand, Bed to chair/wheelchair/BSC Sit to Stand: Contact guard assist Bed to/from chair/wheelchair/BSC transfer type:: Step pivot Step pivot transfers: Min assist General transfer comment: Inc assist to stand from toilet 2/2 lower surface height. Ambulation/Gait Ambulation/Gait assistance: Mod assist, Min assist Gait Distance (Feet): 100 Feet Assistive device: None Gait Pattern/deviations: Step-through pattern, Decreased stride length General Gait Details: MinA over level surface negotiation, up to modA with obstacles or in tight spaces. Right lateral lean and drift, running into right side of doorway without awareness. Gait velocity: decreased   ADL: ADL Overall ADL's : Needs assistance/impaired Grooming: Minimal assistance, Wash/dry hands, Wash/dry face, Oral care, Moderate assistance, Cueing for safety, Cueing for sequencing, Standing Grooming Details (indicate cue type and reason): pt using grooming items improperly (used mouthwash to wipe face), needs cues for proper sequencing and identification of self-care items (especially items located to the R side) Toilet Transfer: Minimal assistance, Cueing for safety, Ambulation, Regular Toilet Toilet Transfer Details (indicate cue type and reason): cues for safe approach  to toilet  and to control descent/use of grab bars Toileting- Clothing Manipulation and Hygiene: Minimal assistance, Cueing for safety, Sit to/from stand Toileting - Clothing Manipulation Details (indicate cue type and reason): prompted to complete clothing mgmt down, performed anterior peri care seated Functional mobility during ADLs: Minimal assistance, Cueing for safety General ADL Comments: Pt bumped into doorframe on the R side upon return to room likely 2/2 visual impairment.   Cognition: Cognition Overall Cognitive Status: Impaired/Different from baseline Arousal/Alertness: Awake/alert Orientation Level: Oriented to person, Disoriented to place, Disoriented to time, Disoriented to situation Comments: TBD as communication improves, does not appear to have awareness of language deficits however Cognition Arousal: Alert Behavior During Therapy: WFL for tasks assessed/performed Overall Cognitive Status: Impaired/Different from baseline     Review of Systems  Unable to perform ROS: Mental acuity       Past Medical History:  Diagnosis Date   Hypertension          History reviewed. No pertinent surgical history.     History reviewed. No pertinent family history.     Social History:  reports that she has never smoked. She has never been exposed to tobacco smoke. She has never used smokeless tobacco. She reports current alcohol use. She reports that she does not use drugs. Allergies:  Allergies      Allergies  Allergen Reactions   Shellfish Allergy              Medications Prior to Admission  Medication Sig Dispense Refill   apixaban  (ELIQUIS ) 5 MG TABS tablet Take 5 mg by mouth 2 (two) times daily.       dorzolamide  (TRUSOPT ) 2 % ophthalmic solution Place 1 drop into both eyes 3 (three) times daily.       LUMIGAN 0.01 % SOLN Place 1 drop into both eyes at bedtime.       metoprolol  tartrate (LOPRESSOR ) 50 MG tablet Take 50 mg by mouth daily.       mirtazapine  (REMERON ) 7.5 MG  tablet Take 7.5 mg by mouth at bedtime.       PREBIOTIC PRODUCT PO Take 1 capsule by mouth daily.       predniSONE  (DELTASONE ) 5 MG tablet Take 5 mg by mouth daily.       Probiotic Product (PROBIOTIC PO) Take 1 capsule by mouth daily.                Blood pressure 138/78, pulse 97, temperature 98 F (36.7 C), temperature source Oral, resp. rate 17, height 5' (1.524 m), weight 59 kg, SpO2 98%. Physical Exam Constitutional:      General: She is not in acute distress.    Appearance: Normal appearance.  HENT:     Head: Normocephalic.     Nose: Nose normal.     Mouth/Throat:     Mouth: Mucous membranes are moist.  Eyes:     Pupils: Pupils are equal, round, and reactive to light.  Cardiovascular:     Rate and Rhythm: Normal rate.  Pulmonary:     Effort: Pulmonary effort is normal.  Abdominal:     Palpations: Abdomen is soft.  Musculoskeletal:        General: No swelling or tenderness. Normal range of motion.     Cervical back: Normal range of motion.  Skin:    General: Skin is warm.  Neurological:     Comments: Pt alert, oriented to name, hospital. Mild right central VII. Speech is fairly clear but language  is inconsistent, partly due to cultural factors but also appears to have receptive and expressive language deficits d/t stroke. Struggled with simple one and two-step commands. Typically able to complete a simple task with repetition and tactile cueing. MMT: RUE at least 4/5 prox to distal. LUE sl stronger with 4+/5. RLE 4- to 4/5 prox to distal. LLE 4/5 to 4+/5 prox to distal. Sensed pain and light touch in all 4 limbs. No abnl resting tone. DTR's tr to 1+.   Psychiatric:     Comments: Pt generally pleasant and tried to cooperate       Lab Results Last 24 Hours       Results for orders placed or performed during the hospital encounter of 07/22/24 (from the past 24 hours)  CBC with Differential/Platelet     Status: Abnormal    Collection Time: 07/25/24  4:34 AM  Result Value  Ref Range    WBC 14.5 (H) 4.0 - 10.5 K/uL    RBC 3.69 (L) 3.87 - 5.11 MIL/uL    Hemoglobin 9.2 (L) 12.0 - 15.0 g/dL    HCT 71.3 (L) 63.9 - 46.0 %    MCV 77.5 (L) 80.0 - 100.0 fL    MCH 24.9 (L) 26.0 - 34.0 pg    MCHC 32.2 30.0 - 36.0 g/dL    RDW 82.6 (H) 88.4 - 15.5 %    Platelets 419 (H) 150 - 400 K/uL    nRBC 0.2 0.0 - 0.2 %    Neutrophils Relative % 71 %    Neutro Abs 10.2 (H) 1.7 - 7.7 K/uL    Lymphocytes Relative 15 %    Lymphs Abs 2.2 0.7 - 4.0 K/uL    Monocytes Relative 12 %    Monocytes Absolute 1.8 (H) 0.1 - 1.0 K/uL    Eosinophils Relative 1 %    Eosinophils Absolute 0.1 0.0 - 0.5 K/uL    Basophils Relative 0 %    Basophils Absolute 0.0 0.0 - 0.1 K/uL    Immature Granulocytes 1 %    Abs Immature Granulocytes 0.21 (H) 0.00 - 0.07 K/uL  Basic metabolic panel     Status: Abnormal    Collection Time: 07/25/24  4:34 AM  Result Value Ref Range    Sodium 140 135 - 145 mmol/L    Potassium 3.6 3.5 - 5.1 mmol/L    Chloride 108 98 - 111 mmol/L    CO2 21 (L) 22 - 32 mmol/L    Glucose, Bld 100 (H) 70 - 99 mg/dL    BUN 9 8 - 23 mg/dL    Creatinine, Ser 9.08 0.44 - 1.00 mg/dL    Calcium  8.6 (L) 8.9 - 10.3 mg/dL    GFR, Estimated >39 >39 mL/min    Anion gap 11 5 - 15       Imaging Results (Last 48 hours)  ECHOCARDIOGRAM COMPLETE Result Date: 07/24/2024    ECHOCARDIOGRAM REPORT   Patient Name:   Jaquayla Hege Date of Exam: 07/24/2024 Medical Rec #:  [id number]        Height:       60.0 in Accession #:    [id number]       Weight:       130.0 lb Date of Birth:  [birth year]         BSA:          1.554 m Patient Age:    83 years         BP:  122/67 mmHg Patient Gender: F                HR:           96 bpm. Exam Location:  Inpatient Procedure: 2D Echo, Cardiac Doppler and Color Doppler (Both Spectral and Color            Flow Doppler were utilized during procedure). Indications:   Abnormal ECG R94.31  History:       Patient has no prior history of Echocardiogram examinations.                 Stroke.  Sonographer:   Thea Norlander RCS Referring      [facility] SAMTANI Phys: IMPRESSIONS  1. Left ventricular ejection fraction, by estimation, is 60 to 65%. The left ventricle has normal function. The left ventricle has no regional wall motion abnormalities. Left ventricular diastolic parameters were normal.  2. Right ventricular systolic function is normal. The right ventricular size is normal.  3. The mitral valve is normal in structure. No evidence of mitral valve regurgitation. No evidence of mitral stenosis.  4. The aortic valve is abnormal. There is mild thickening of the aortic valve with a small subcentimeter echodensity at the LV side of the valve. There is mild aortic valve regurgitation. Consider TEE for further evaluation in setting of recent CVA.  5. The inferior vena cava is normal in size with greater than 50% respiratory variability, suggesting right atrial pressure of 3 mmHg. FINDINGS  Left Ventricle: Left ventricular ejection fraction, by estimation, is 60 to 65%. The left ventricle has normal function. The left ventricle has no regional wall motion abnormalities. The left ventricular internal cavity size was normal in size. There is  no left ventricular hypertrophy. Left ventricular diastolic parameters were normal. Right Ventricle: The right ventricular size is normal. No increase in right ventricular wall thickness. Right ventricular systolic function is normal. Left Atrium: Left atrial size was normal in size. Right Atrium: Right atrial size was normal in size. Pericardium: There is no evidence of pericardial effusion. Mitral Valve: The mitral valve is normal in structure. No evidence of mitral valve regurgitation. No evidence of mitral valve stenosis. Tricuspid Valve: The tricuspid valve is normal in structure. Tricuspid valve regurgitation is not demonstrated. No evidence of tricuspid stenosis. Aortic Valve: The aortic valve is abnormal. There is mild thickening of the  aortic valve. Aortic valve regurgitation is mild. No aortic stenosis is present. Aortic valve peak gradient measures 10.4 mmHg. Pulmonic Valve: The pulmonic valve was normal in structure. Pulmonic valve regurgitation is not visualized. No evidence of pulmonic stenosis. Aorta: The aortic root is normal in size and structure. Venous: The inferior vena cava is normal in size with greater than 50% respiratory variability, suggesting right atrial pressure of 3 mmHg. IAS/Shunts: No atrial level shunt detected by color flow Doppler.  LEFT VENTRICLE PLAX 2D LVIDd:         4.20 cm   Diastology LVIDs:         2.50 cm   LV e' medial:    9.46 cm/s LV PW:         0.90 cm   LV E/e' medial:  6.6 LV IVS:        0.90 cm   LV e' lateral:   14.70 cm/s LVOT diam:     2.00 cm   LV E/e' lateral: 4.2 LV SV:         51 LV SV Index:   33  LVOT Area:     3.14 cm  RIGHT VENTRICLE             IVC RV S prime:     13.80 cm/s  IVC diam: 1.40 cm TAPSE (M-mode): 1.9 cm LEFT ATRIUM             Index        RIGHT ATRIUM           Index LA diam:        4.30 cm 2.77 cm/m   RA Area:     10.50 cm LA Vol (A2C):   39.8 ml 25.61 ml/m  RA Volume:   18.63 ml  11.99 ml/m LA Vol (A4C):   26.8 ml 17.24 ml/m LA Biplane Vol: 30.1 ml 19.37 ml/m  AORTIC VALVE AV Area (Vmax): 1.92 cm AV Vmax:        161.00 cm/s AV Peak Grad:   10.4 mmHg LVOT Vmax:      98.50 cm/s LVOT Vmean:     65.200 cm/s LVOT VTI:       0.163 m  AORTA Ao Root diam: 3.00 cm Ao Asc diam:  3.60 cm MITRAL VALVE MV Area (PHT): 5.31 cm    SHUNTS MV Decel Time: 143 msec    Systemic VTI:  0.16 m MV E velocity: 62.40 cm/s  Systemic Diam: 2.00 cm MV A velocity: 96.40 cm/s MV E/A ratio:  0.65 Aditya Sabharwal Electronically signed by Ria Commander Signature Date/Time: 07/24/2024/3:45:09 PM    Final     MR BRAIN WO CONTRAST Result Date: 07/24/2024 CLINICAL DATA:  Provided history: Stroke, follow-up. EXAM: MRI HEAD WITHOUT CONTRAST TECHNIQUE: Multiplanar, multiecho pulse sequences of the brain and  surrounding structures were obtained without intravenous contrast. COMPARISON:  Non-contrast head CT and CT angiogram head/neck 07/23/2024. FINDINGS: Intermittently motion degraded examination. Most notably, the axial T1 sequence is moderate to severely motion degraded and the coronal T2 sequence is severely motion degraded. Within this limitation, findings are swallows. Brain: Generalized cerebral atrophy. Large acute left MCA territory infarct affecting the posterior insula, posterior frontal lobe, parietal lobe and occipital lobe. This infarct does not appear significantly changed in extent as compared to yesterday's head CT. No significant mass effect at this time. No evidence of hemorrhagic conversion. Tiny acute cortical infarct within the perirolandic right frontoparietal region. Tiny acute infarct within the right parietal white matter (series 2, image 36). Mild multifocal T2 FLAIR hyperintense signal abnormality elsewhere within the cerebral white matter and pons, nonspecific but compatible chronic small ischemic disease. Redemonstrated chronic microhemorrhage within the left occipital lobe. Tiny acute infarcts within the bilateral cerebellar hemispheres. probable small (2-3 mm) meningioma along the right petrous apex better appreciated on the prior brain MRI of 12/28/2021 (series 6, image 8 of the current exam). No extra-axial fluid collection. No midline shift. Vascular: Please see the CTA head/neck performed yesterday. Skull and upper cervical spine: No focal race marrow lesion. Sinuses/Orbits: No mass or acute finding within the imaged orbits. Prior bilateral ocular lens replacement. No significant paranasal sinus disease. IMPRESSION: 1. Intermittently motion degraded examination as described. Within this limitation, findings are as follows. 2. Large acute left MCA territory infarct, as described and not significantly changed in extent since yesterday's head CT. No significant mass effect at this time.  No evidence of hemorrhagic conversion. 3. Additional tiny acute infarcts within the perirolandic right frontoparietal cortex, right parietal white matter and bilateral cerebellar hemispheres. 4. Background parenchymal atrophy and chronic small vessel ischemic disease.  5. 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 Electronically Signed   By: Rockey Childs D.O.   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  Does the need for close, 24 hr/day medical supervision in concert with the patient's rehab needs make it unreasonable for this patient to be served in a less intensive setting? Yes Co-Morbidities requiring supervision/potential complications:  -right popliteal, tibial and peroneal vein DVT's -Anemia, GIB -HTN Due to bladder management, bowel management, safety, skin/wound care, disease management, medication administration, pain management, and patient education, does the patient require 24 hr/day rehab nursing? Yes Does the patient require coordinated care of a physician, rehab nurse, therapy disciplines of PT, OT, SLP to address physical and functional deficits in the context of the above medical diagnosis(es)? Yes Addressing deficits in the following areas: balance, endurance, locomotion, strength, transferring, bowel/bladder control, bathing, dressing, feeding, grooming, toileting, cognition, language, and psychosocial support Can the patient actively participate in an intensive therapy program of at least 3 hrs of therapy per day at least 5 days per week? Yes The potential for patient to make measurable gains while on inpatient rehab is excellent Anticipated functional outcomes upon discharge from inpatient rehab are supervision  with PT, supervision with OT, supervision and min assist with  SLP. Estimated rehab length of stay to reach the above functional goals is: 7-9 days Anticipated discharge destination: Home Overall Rehab/Functional Prognosis: excellent   POST ACUTE RECOMMENDATIONS: This patient's condition is appropriate for continued rehabilitative care in the following setting: CIR Patient has agreed to participate in recommended program. Will need to follow up with daughter Note that insurance prior authorization may be required for reimbursement for recommended care.   Comment: Rehab Admissions Coordinator to follow up.       I have personally performed a face to face diagnostic evaluation of this patient. Additionally, I have examined the patient's medical record including any pertinent labs and radiographic images.     Thanks,   Arthea ONEIDA Gunther, MD 07/25/2024          Routing History

## 2024-07-31 NOTE — H&P (Signed)
 Physical Medicine and Rehabilitation Admission H&P        Chief Complaint  Patient presents with   Functional deficits due to stroke.       HPI: Tiffany [name] is an 83 year old female with history of HTN, syncope, pre-diabetes, anemia,  glaucoma, recent diagnosis of DVT- on eliquis ;  who was admitted on 07/23/24 with gabled speech and confusion. 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  CTA negative for LVO and showed posterior L-MCA M3 or M4 brach occlusion. MRI brain revealed large acute L-MCA territory infarct without changes,  patchy sclerosis and thickening squamous portion of left temporal bone and additional tiny acute infarcts in right perirolantic and frontotemporal cortex, right parietal and bilateral cerebellar hemispheres.  2 D echo showed EF 60-65% with no wall abnormality and mild AVR without stenosis. She was transfused with 2 units PRBC and anemia felt to be chronic and to monitor H/H w/ outpatient colonoscopy recommended per discussion with Dr. Stacia.   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 Eliquis  resumed on 09/22 with recommendations of 30 day event monitor after d/c to rule out A fib.   Repeat BLE dopplers done revealing acute DVT right poplitea, peroneal and posterior tib veins 09/23. She has had issues with fever 101.2 on 09/27 as well as ongoing issues with tachycardia which was treated with IVF and monitored off antibiotics. PT/OT/ST consulted and patient noted to have expressive deficits, requires min to mod assist with ADL tasks with difficulty sequencing and initiation--question apraxia as well as  CGA- min assist with mobility. CIR recommended due to functional decline.      Review of Systems  Unable to perform ROS: Language           Past Medical History:  Diagnosis Date   Hypertension            History reviewed. No pertinent  surgical history.         History reviewed. No pertinent family history.         Social History:  reports that she has never smoked. She has never been exposed to tobacco smoke. She has never used smokeless tobacco. She reports current alcohol use. She reports that she does not use drugs.       Allergies      Allergies  Allergen Reactions   Shellfish Allergy                Medications Prior to Admission  Medication Sig Dispense Refill   apixaban  (ELIQUIS ) 5 MG TABS tablet Take 5 mg by mouth 2 (two) times daily.       dorzolamide  (TRUSOPT ) 2 % ophthalmic solution Place 1 drop into both eyes 3 (three) times daily.       LUMIGAN 0.01 % SOLN Place 1 drop into both eyes at bedtime.       metoprolol  tartrate (LOPRESSOR ) 50 MG tablet Take 50 mg by mouth daily.       mirtazapine  (REMERON ) 7.5 MG tablet Take 7.5 mg by mouth at bedtime.       PREBIOTIC PRODUCT PO Take 1 capsule by mouth daily.       predniSONE  (DELTASONE ) 5 MG tablet Take 5 mg by mouth daily.       Probiotic Product (PROBIOTIC PO) Take 1 capsule by mouth daily.  Home: Home Living Family/patient expects to be discharged to:: Private residence Living Arrangements: Children Available Help at Discharge: Family Type of Home: House Home Access: Stairs to enter Secretary/administrator of Steps: 5 STE Entrance Stairs-Rails: None (wall on one side) Home Layout: Able to live on main level with bedroom/bathroom Bathroom Shower/Tub: Health visitor: Standard Bathroom Accessibility: Yes Home Equipment: None Additional Comments: Pt's daughter works Museum/gallery exhibitions officer; picks up weekday hours occasionally. Pt SIL is home Saturday and Sunday. Pt sister who lives in [place] is willing to come and help out.  Lives With: Daughter   Functional History: Prior Function Prior Level of Function : Independent/Modified Independent, Driving Mobility Comments: pt flew from [facility] by herself 2 weeks ago PTA ADLs Comments:  Pt enjoys sewing; has a pillbox but does not use it   Functional Status:  Mobility: Bed Mobility Overal bed mobility: Needs Assistance Bed Mobility: Sit to Supine Supine to sit: Contact guard, HOB elevated, Used rails Sit to supine: Supervision, HOB elevated General bed mobility comments: increased effort to get LE into bed. Transfers Overall transfer level: Needs assistance Equipment used: None Transfers: Sit to/from Stand Sit to Stand: Contact guard assist Bed to/from chair/wheelchair/BSC transfer type:: Step pivot Step pivot transfers: Min assist General transfer comment: PT requires steadying once up in standing. Ambulation/Gait Ambulation/Gait assistance: Min assist, Mod assist Gait Distance (Feet): 300 Feet Assistive device: None Gait Pattern/deviations: Step-through pattern, Decreased stride length, Staggering left, Staggering right, Drifts right/left General Gait Details: MinA over level surface negotiation, up to modA 2x with obstacles or in tight spaces. Right lateral lean and drift, running into right side of the hall. Improved moderately with verbal cues for using tiles on the floor for maintaining a straight line. Gait velocity: decreased Gait velocity interpretation: <1.8 ft/sec, indicate of risk for recurrent falls Stairs: Yes Stairs assistance: Min assist Stair Management: One rail Right, One rail Left, Step to pattern Number of Stairs: 2 General stair comments: Min A for navigating stairs with rails.   ADL: ADL Overall ADL's : Needs assistance/impaired Grooming: Moderate assistance, Oral care, Wash/dry hands, Cueing for sequencing, Standing Grooming Details (indicate cue type and reason): heavy multimodal cues for verbally identifying and locating grooming items located to the R side, cued for proper sequencing and problem solving through task initiation/termination Upper Body Dressing : Minimal assistance, Cueing for sequencing, Sitting Upper Body Dressing  Details (indicate cue type and reason): initially donned shirt in wrong arm hole, cued to identify problem and formulate solution, cued for proper sequencing and item identification Toilet Transfer: Minimal assistance, Cueing for safety, Ambulation, Regular Toilet Toilet Transfer Details (indicate cue type and reason): cues for safe approach to toilet and to control descent/use of grab bars Toileting- Clothing Manipulation and Hygiene: Minimal assistance, Cueing for safety, Sit to/from stand Toileting - Clothing Manipulation Details (indicate cue type and reason): prompted to complete clothing mgmt down, performed anterior peri care seated Functional mobility during ADLs: Minimal assistance, Cueing for safety General ADL Comments: Pt bumped into doorframe on the R side upon return to room likely 2/2 visual impairment.   Cognition: Cognition Overall Cognitive Status: Impaired/Different from baseline Arousal/Alertness: Awake/alert Orientation Level: Disoriented to time Comments: TBD as communication improves, does not appear to have awareness of language deficits however Cognition Arousal: Alert Behavior During Therapy: [facility] for tasks assessed/performed Overall Cognitive Status: Impaired/Different from baseline     Blood pressure 111/67, pulse (!) 116, temperature 98.9 F (37.2 C), temperature source Oral, resp. rate 19, height 5' (  1.524 m), weight 59 kg, SpO2 100%. Physical Exam Vitals and nursing note reviewed.  Constitutional:      General: She is not in acute distress.    Appearance: Normal appearance.  HENT:     Head: Normocephalic and atraumatic.     Right Ear: External ear normal.     Left Ear: External ear normal.     Nose: Nose normal.     Mouth/Throat:     Mouth: Mucous membranes are moist.  Eyes:     Conjunctiva/sclera: Conjunctivae normal.  Cardiovascular:     Rate and Rhythm: Tachycardia present.     Heart sounds: No murmur heard.    No gallop.  Pulmonary:      Effort: Pulmonary effort is normal. No respiratory distress.     Breath sounds: No wheezing or rales.  Abdominal:     General: Bowel sounds are normal. There is no distension.     Tenderness: There is no abdominal tenderness.  Musculoskeletal:        General: Tenderness (right SCM, traps, levator scapulae) present. No swelling.     Cervical back: Normal range of motion.     Comments: Reported right shoulder pain but able to range independently without pain.   Skin:    General: Skin is warm and dry.  Neurological:     Mental Status: She is alert.     Comments: Alert, oriented to self and hospital intermittently. Expressive > receptive deficits with echolalia. Has poor awareness of deficits.  Able to follow simple motor commands with verbal and visual cues. MMT: RUE 4/5 and LUE 4-/5 to 4/5 prox to distal. BLE 3+ HF, KE and 4/5 ADF/PF. Sensed pain in all 4's. No abnl resting tone. DTR's 1+  Psychiatric:     Comments: Flat, distracted       Lab Results Last 48 Hours        Results for orders placed or performed during the hospital encounter of 07/22/24 (from the past 48 hours)  CBC with Differential/Platelet     Status: Abnormal    Collection Time: 07/30/24  4:54 AM  Result Value Ref Range    WBC 16.3 (H) 4.0 - 10.5 K/uL    RBC 3.54 (L) 3.87 - 5.11 MIL/uL    Hemoglobin 8.7 (L) 12.0 - 15.0 g/dL    HCT 72.3 (L) 63.9 - 46.0 %    MCV 78.0 (L) 80.0 - 100.0 fL    MCH 24.6 (L) 26.0 - 34.0 pg    MCHC 31.5 30.0 - 36.0 g/dL    RDW 81.8 (H) 88.4 - 15.5 %    Platelets 390 150 - 400 K/uL    nRBC 0.0 0.0 - 0.2 %    Neutrophils Relative % 67 %    Neutro Abs 10.8 (H) 1.7 - 7.7 K/uL    Lymphocytes Relative 16 %    Lymphs Abs 2.6 0.7 - 4.0 K/uL    Monocytes Relative 14 %    Monocytes Absolute 2.3 (H) 0.1 - 1.0 K/uL    Eosinophils Relative 2 %    Eosinophils Absolute 0.3 0.0 - 0.5 K/uL    Basophils Relative 0 %    Basophils Absolute 0.1 0.0 - 0.1 K/uL    Immature Granulocytes 1 %    Abs  Immature Granulocytes 0.22 (H) 0.00 - 0.07 K/uL      Comment: Performed at [facility], [address], [place], [place] [address]  Basic metabolic panel     Status:  Abnormal    Collection Time: 07/30/24  4:54 AM  Result Value Ref Range    Sodium 138 135 - 145 mmol/L    Potassium 3.7 3.5 - 5.1 mmol/L    Chloride 106 98 - 111 mmol/L    CO2 19 (L) 22 - 32 mmol/L    Glucose, Bld 102 (H) 70 - 99 mg/dL      Comment: Glucose reference range applies only to samples taken after fasting for at least 8 hours.    BUN 23 8 - 23 mg/dL    Creatinine, Ser 9.09 0.44 - 1.00 mg/dL    Calcium  8.6 (L) 8.9 - 10.3 mg/dL    GFR, Estimated >39 >39 mL/min      Comment: (NOTE) Calculated using the CKD-EPI Creatinine Equation (2021)      Anion gap 13 5 - 15      Comment: Performed at [facility], [address], [place], [place] [address]      Imaging Results (Last 48 hours)  No results found.           Blood pressure 111/67, pulse (!) 116, temperature 98.9 F (37.2 C), temperature source Oral, resp. rate 19, height 5' (1.524 m), weight 59 kg, SpO2 100%.   Medical Problem List and Plan: 1. 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  DVT/Antithrombotics: -DVT/anticoagulation:  Pharmaceutical: Eliquis              -antiplatelet therapy: N/A 3. Pain Management: tylenol  prn.  4. Mood/Behavior/Sleep: LCSW to follow for evaluation and support.             --melatonin prn insomnia.              -antipsychotic agents: N\/A 5. Neuropsych/cognition: This patient is not capable of making decisions on her own behalf. 6. Skin/Wound Care: Routine pressure relief measures.  7. Fluids/Electrolytes/Nutrition: Monitor I/O. Check CMET in am. 8.   Acute on chronic anemia: Monitor for signs of bleeding. H/H stable 6.1-->8.7. --add iron supplement as Iron  level <10.  9.  Resting tachycardia: HR ranging in 110-120 at rest. CTA to rule out PE?             -pt is not in distress, breathing comfortably, no chest pain, on eliquis              --Afebrile X 24 hours.   --Monitor for symptoms with increase in activity.  10. Leukocytosis: WBC on upward trend--16.0-->14.5-->16.3   -however, pt is afebrile, urine/chest clear, on full a/c --Sepsis work up prn temp elevation         Tiffany GORMAN Schmitz, PA-C 07/31/2024  I have personally performed a face to face diagnostic evaluation of this patient and formulated the key components of the plan.  Additionally, I have personally reviewed laboratory data, imaging studies, as well as relevant notes and concur with the physician assistant's documentation above.  The patient's status has not changed from the original H&P.  Any changes in documentation from the acute care chart have been noted above.  Tiffany IVAR Gunther, MD, [facility]EELLEN

## 2024-07-31 NOTE — Progress Notes (Signed)
 Tiffany Arthea DASEN, MD  Physician Physical Medicine and Rehabilitation   PMR Pre-admission    Signed   Date of Service: 07/31/2024 11:04 AM  Related encounter: ED to Hosp-Admission (Discharged) from 07/22/2024 in [facility] [facility]   Signed     Expand All Collapse All  PMR Admission Coordinator Pre-Admission Assessment   Patient: Tiffany [name] is an 83 y.o., female MRN: [id number] DOB: [birth year] Height: 5' (152.4 cm) Weight: 59 kg                                                                                                                                                  Insurance Information HMO:     PPO: yes     PCP:      IPA:      80/20:      OTHER:  PRIMARY: [employer] Choice      Policy#: [id number]      Subscriber: patient CM Name: Powell      Phone#: not provided     Fax#: [phone] Pre-Cert#: [id number] auth for CIR from [place] with [facility] Expedited Appeals dept for admit 9/29/ with next review date 10/6.  Updates due to fax listed above.        Employer:  Benefits:  Phone #: online at [email or web address]     Name:  Eff. Date: 05/03/23-still active     Deduct: $350 ($0 met)      Out of Pocket Max: $9,350 ($299.12 met)      Life Max: NA  CIR: $475/day co-pay with a max co-pay of $2,375/admission      SNF: 100% coverage for days 1-20, $214/day co-pay for days 21-100 Outpatient:     Co-Pay:  Home Health: 100% coverage      Co-Pay:  DME: 88% coverage     Co-Pay: 12% co-insurance Providers: in-network SECONDARY:       Policy#:       Phone#:    Artist:       Phone#:    The Data processing manager" for patients in Inpatient Rehabilitation Facilities with attached "Privacy Act Statement-Health Care Records" was provided and verbally reviewed with: Family   Emergency Contact Information Contact Information       Name Relation Home Work Mobile    [name],[name] Daughter     [phone]         Other Contacts   None  on File      Current Medical History  Patient Admitting Diagnosis: CVA History of Present Illness: Pt is an 83 year old female with medical hx significant for: HTN, DVT. Pt [facility] to [facility] 07[facility]22[facility]24 d/t garbled speech and confusion. Labs revealed Hgb of 6.1. Pt received 2 units PRBC. 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 Therapy evaluations completed and CIR recommended d/t pt's deficits in functional mobility. Complete NIHSS TOTAL: 3 Glasgow Coma Scale Score: 14   Patient's medical record from [facility] has been reviewed by the rehabilitation admission coordinator and physician.   Past Medical History      Past Medical History:  Diagnosis Date   Hypertension            Has the patient had major surgery during 100 days prior to admission? No   Family History  family history is not on file.     Current Medications   Current Medications    Current Facility-Administered Medications:    apixaban  (ELIQUIS ) tablet 5 mg, 5 mg, Oral, BID, [name], Tiffany U, DO, 5 mg at 07/25/24 0830   dorzolamide  (TRUSOPT ) 2 % ophthalmic solution 1 drop, 1 drop, Both Eyes, TID, [name], Tiffany U, DO, 1 drop at 07/25/24 0830   latanoprost  (XALATAN ) 0.005 % ophthalmic solution 1 drop, 1 drop, Both Eyes, QHS, [name], Tiffany U, DO, 1 drop at 07/24/24 2112   metoprolol  tartrate (LOPRESSOR ) tablet 12.5 mg, 12.5 mg, Oral, Daily, [name], Tiffany U, DO   mirtazapine  (REMERON ) tablet 7.5 mg, 7.5 mg, Oral, QHS, [name], Tiffany U, DO, 7.5 mg at 07/24/24 2111   pantoprazole  (PROTONIX ) injection 40 mg, 40 mg, Intravenous, Q12H, [name], Jai-Gurmukh, MD, 40 mg at 07/25/24 0830   predniSONE  (DELTASONE ) tablet 5 mg, 5 mg, Oral, Daily, [name], Tiffany U, DO, 5 mg at 07/25/24 0830     Patients Current Diet:  Diet Order                  Diet regular Room service appropriate? Yes; Fluid consistency: Thin  Diet effective  now                         Precautions / Restrictions Precautions Precautions: Fall Precaution/Restrictions Comments: R visual field cut Restrictions Weight Bearing Restrictions Per Provider Order: No    Has the patient had 2 or more falls or a fall with injury in the past year?No   Prior Activity Level Limited Community (1-2x/wk): gets out of house ~2-3 days/week   Prior Functional Level Prior Function Prior Level of Function : Independent/Modified Independent, Driving Mobility Comments: pt flew from [facility] by herself 2 weeks ago PTA ADLs Comments: Pt enjoys sewing; has a pillbox but does not use it   Self Care: Did the patient need help bathing, dressing, using the toilet or eating?  Independent   Indoor Mobility: Did the patient need assistance with walking from room to room (with or without device)? Independent   Stairs: Did the patient need assistance with internal or external stairs (with or without device)? Independent   Functional Cognition: Did the patient need help planning regular tasks such as shopping or remembering to take medications? Independent   Patient Information Are you of Hispanic, Latino/a,or Spanish origin?: X. Patient unable to respond, A. No, not of Hispanic, Latino/a, or Spanish origin What is your race?: X. Patient unable to respond, B. Black or African American Do you need or want an interpreter to communicate with a doctor or health care staff?: 9. Unable to respond Patient information obtained via proxy : daughter answered questions (pt does not need an interpreter)  Patient's Response To:  Health Literacy and Transportation Is the patient able to respond to health literacy and transportation needs?: No Health Literacy - How often do you need to have someone help you  when you read instructions, pamphlets, or other written material from your doctor or pharmacy?: Patient unable to respond In the past 12 months, has lack of transportation kept  you from medical appointments or from getting medications?: No In the past 12 months, has lack of transportation kept you from meetings, work, or from getting things needed for daily living?: No Health Literacy and Transportation obtained via proxy: daughter answered the questions (pt able to read medical information and pt has has not missed appointements d/t transportation issues.)   Home Assistive Devices / Equipment Home Equipment: None   Prior Device Use: Indicate devices/aids used by the patient prior to current illness, exacerbation or injury? None of the above   Current Functional Level Cognition   Arousal/Alertness: Awake/alert Overall Cognitive Status: Impaired/Different from baseline Orientation Level: Oriented to person, Disoriented to place, Disoriented to time, Disoriented to situation Comments: TBD as communication improves, does not appear to have awareness of language deficits however    Extremity Assessment (includes Sensation/Coordination)   Upper Extremity Assessment: Generalized weakness (proximal limitations in strength at B shoulders (with + pain reaction), daughter reporting no known hx of arthritis or shoulder issues PTA)  Lower Extremity Assessment: Difficult to assess due to impaired cognition (Able to activate quadriceps and extend knees with extensive cueing; grossly equal strength bilaterally, but unable to test functional strength with stnding)     ADLs   Overall ADL's : Needs assistance/impaired Grooming: Minimal assistance, Wash/dry hands, Wash/dry face, Oral care, Moderate assistance, Cueing for safety, Cueing for sequencing, Standing Grooming Details (indicate cue type and reason): pt using grooming items improperly (used mouthwash to wipe face), needs cues for proper sequencing and identification of self-care items (especially items located to the R side) Toilet Transfer: Minimal assistance, Cueing for safety, Ambulation, Regular Toilet Toilet Transfer  Details (indicate cue type and reason): cues for safe approach to toilet and to control descent/use of grab bars Toileting- Clothing Manipulation and Hygiene: Minimal assistance, Cueing for safety, Sit to/from stand Toileting - Clothing Manipulation Details (indicate cue type and reason): prompted to complete clothing mgmt down, performed anterior peri care seated Functional mobility during ADLs: Minimal assistance, Cueing for safety General ADL Comments: Pt bumped into doorframe on the R side upon return to room likely 2/2 visual impairment.     Mobility   Overal bed mobility: Needs Assistance Bed Mobility: Supine to Sit, Sit to Supine Supine to sit: Supervision Sit to supine: Supervision, HOB elevated General bed mobility comments: Cues to initiate return to supine at end of session     Transfers   Overall transfer level: Needs assistance Equipment used: None Transfers: Sit to/from Stand, Bed to chair/wheelchair/BSC Sit to Stand: Contact guard assist Bed to/from chair/wheelchair/BSC transfer type:: Step pivot Step pivot transfers: Min assist General transfer comment: Inc assist to stand from toilet 2/2 lower surface height.     Ambulation / Gait / Stairs / Wheelchair Mobility   Ambulation/Gait Ambulation/Gait assistance: Mod assist, Min assist Gait Distance (Feet): 100 Feet Assistive device: None Gait Pattern/deviations: Step-through pattern, Decreased stride length General Gait Details: MinA over level surface negotiation, up to modA with obstacles or in tight spaces. Right lateral lean and drift, running into right side of doorway without awareness. Gait velocity: decreased     Posture / Balance Dynamic Sitting Balance Sitting balance - Comments: no overt LOB observed Balance Overall balance assessment: Needs assistance Sitting-balance support: No upper extremity supported, Feet supported Sitting balance-Leahy Scale: Good Sitting balance - Comments: no overt  LOB  observed Standing balance support: No upper extremity supported, During functional activity Standing balance-Leahy Scale: Poor Standing balance comment: pt often reaching for walls/furniture to steady self when walking to sink, cues for remaining upright and to prevent LOB     Special considerations/ Life events NA        Previous Home Environment (from acute therapy documentation) Living Arrangements: Children  Lives With: Daughter Available Help at Discharge: Family Type of Home: House Home Layout: Able to live on main level with bedroom/bathroom Home Access: Stairs to enter Entrance Stairs-Rails: None (wall on one side) Entrance Stairs-Number of Steps: 5 STE Bathroom Shower/Tub: Health visitor: Standard Home Care Services: No Additional Comments: Pt's daughter works Museum/gallery exhibitions officer; picks up weekday hours occasionally. Pt SIL is home Saturday and Sunday. Pt sister who lives in [place] is willing to come and help out.   Discharge Living Setting Plans for Discharge Living Setting: Patient's home Type of Home at Discharge: House Discharge Home Layout: Two level, Able to live on main level with bedroom/bathroom Discharge Home Access: Stairs to enter Entrance Stairs-Rails: None (wall on left side) Entrance Stairs-Number of Steps: 5 Discharge Bathroom Shower/Tub: Walk-in shower Discharge Bathroom Toilet: Standard Does the patient have any problems obtaining your medications?: No   Social/Family/Support Systems Anticipated Caregiver: Oneta Fireman (daughter) and other family Anticipated Caregiver's Contact Information: [phone] Caregiver Availability: 24/7 Discharge Plan Discussed with Primary Caregiver: Yes Is Caregiver In Agreement with Plan?: Yes Does Caregiver/Family have Issues with Lodging/Transportation while Pt is in Rehab?: No     Goals Patient/Family Goal for Rehab: Supervision: PT/OT, Supervision-Min A:ST Expected length of stay: 7-9 days Pt/Family Agrees to  Admission and willing to participate: Yes Program Orientation Provided & Reviewed with Pt/Caregiver Including Roles  & Responsibilities: Yes     Decrease burden of Care through IP rehab admission: NA     Possible need for SNF placement upon discharge:Not anticipated     Patient Condition: This patient's medical and functional status has changed since the consult dated: 9/23 in which the Rehabilitation Physician determined and documented that the patient's condition is appropriate for intensive rehabilitative care in an inpatient rehabilitation facility. See History of Present Illness (above) for medical update. Functional changes are: pt min to mod assist for mobility without AD. Patient's medical and functional status update has been discussed with the Rehabilitation physician and patient remains appropriate for inpatient rehabilitation. Will admit to inpatient rehab today.   Preadmission Screen Completed By:  Tinnie SHAUNNA Yvone Delayne, CCC-SLP, updates by Reche Lowers, PT, DPT 07/25/2024 2:43 PM ______________________________________________________________________   Discussed status with Dr. Babs on 07/31/24 at  11:04 AM  and received approval for admission today.   Admission Coordinator:  Tinnie SHAUNNA Yvone Delayne, time 11:04 AM /Date09/29/25              Revision History

## 2024-07-31 NOTE — Progress Notes (Signed)
 Inpatient Rehabilitation Admission Medication Review by a Pharmacist  A complete drug regimen review was completed for this patient to identify any potential clinically significant medication issues.  High Risk Drug Classes Is patient taking? 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), embolic CVA  Antibiotic No   Opioid No   Antiplatelet No   Hypoglycemics/insulin No   Vasoactive Medication Yes Metoprolol  - hypertension, sinus tachycardia  Chemotherapy No   Other Yes Atorvastatin  - hyperlipidemia Dorzolamide , Latanoprost  - glaucoma Mirtazapine  - appetite stimulation Pantoprazole  - GI prophylaxis Prednisone  - inflammatory condition Trigels-F Forte - supplement  PRNs: Acetaminophen  - mild pain Maalox - indigestion Diphenhydramine - itching Guaifenesin-dextromethorphan - cough Melatonin - sleep/ insomnia Bisacodyl PR, Fleets enema - constipation     Type of Medication Issue Identified Description of Issue Recommendation(s)  Drug Interaction(s) (clinically significant)     Duplicate Therapy     Allergy     No Medication Administration End Date     Incorrect Dose     Additional Drug Therapy Needed     Significant med changes from prior encounter (inform family/care partners about these prior to discharge). New Atorvastatin , Pantoprazole  and Trigels-F Forte.  Metoprolol  dose decreased from 50 to 25 mg daily.  Discharge note indicates plan to resume 50 mg daily.  Communicate changes with patient/family prior to discharge.  Monitor blood pressure and adjust dose if clinically warranted.  Other  Off weekly Fosamax and Vitamin D. Off prebiotic and probiotic.   MC therapeutic substitution of Latanoprost  from Bimatoprost. Resume at discharge if clinically warranted.  Back to Bimatoprost (Lumigan) at discharge.    Clinically significant  medication issues were identified that warrant physician communication and completion of prescribed/recommended actions by midnight of the next day:  No  Pharmacist comments:  - Spoke with daughter, Oneta regarding Prednisone  indication, who reports that she has been taking it chronically per Dr. Benjamine, since earlier this year. She plans to follow up after discharge regarding ongoing plan. - Daughter also reports that Mirtazapine  is prescribed for appetite stimulation.  Time spent performing this drug regimen review (minutes):  [address]   Genaro Zebedee Calin, [place] 07/31/2024 5:17 PM

## 2024-07-31 NOTE — Progress Notes (Signed)
 Arrived to CIR Today. Discussed unit protocols, alarms/call bell usage, therapy scheduling, education binder. All questions answered at bedside. Admission/Assessments complete.  Geni Chambers, LPN

## 2024-07-31 NOTE — Progress Notes (Signed)
 Patient discharge to rehab [phone].  Report called to RN Geni at rehab.  Patient is well and vital sign stable at time of transport

## 2024-07-31 NOTE — Discharge Summary (Signed)
 Physician Discharge Summary  Tiffany [name] FMW:[id number] DOB: 1941/06/28 DOA: 07/22/2024  PCP: Benjamine Aland, MD  Admit date: 07/22/2024 Discharge date: 07/31/2024 30 Day Unplanned Readmission Risk Score    Flowsheet Row ED to Hosp-Admission (Current) from 07/22/2024 in [facility]  30 Day Unplanned Readmission Risk Score (%) 14.32 Filed at 07/31/2024 1200    This score is the patient's risk of an unplanned readmission within 30 days of being discharged (0 -100%). The score is based on dignosis, age, lab data, medications, orders, and past utilization.   Low:  0-14.9   Medium: 15-21.9   High: 22-29.9   Extreme: 30 and above          Admitted From: Home Disposition: CIR  Recommendations for Outpatient Follow-up:  Follow up with PCP in 1-2 weeks Please obtain BMP/CBC in one week  follow-up with neurology in 4 weeks Please follow up with your PCP on the following pending results: Unresulted Labs (From admission, onward)     Start     Ordered   07/30/24 0413  CBC  ONCE - STAT,   STAT       Question:  Specimen collection method  Answer:  Lab=Lab collect   07/30/24 0412   Signed and Held  Comprehensive metabolic panel  Tomorrow morning,   R       Question:  Specimen collection method  Answer:  Lab=Lab collect   Signed and Held   Signed and Held  Basic metabolic panel  Every Mon,Thu,   R     Question:  Specimen collection method  Answer:  Lab=Lab collect   Signed and Held   Signed and Held  CBC with Differential/Platelet  Tomorrow morning,   R       Question:  Specimen collection method  Answer:  Lab=Lab collect   Signed and Held   Signed and Held  CBC  Every Mon,Thu,   R     Question:  Specimen collection method  Answer:  Lab=Lab collect   Signed and Held              Home Health: None Equipment/Devices: None  Discharge Condition: Stable CODE STATUS: Full code Diet recommendation:  Diet Order             Diet regular Room service appropriate?  Yes; Fluid consistency: Thin  Diet effective now                   Subjective: Seen and examined.  Some shoulder pain today which appears to be musculoskeletal.  No other complaint.  Remains alert and oriented.  Brief/Interim Summary: Tiffany [name] is a 83 y.o. female with a PMHx of HTN, recently started on Eliquis  per report, presenting to the ED with garbled speech. Daughter brought her in for what was thought to be confusion and not acting normally. The patient was oriented only to her name on arrival. Labs revealed severe anemia with a Hgb of 6.1. CT head revealed a subacute left temporo-occipital ischemic infarct.  Admitted under hospitalist service, seen by neurology however currently awaiting placement to CIR.  Details below.   Assessment & Plan:   Principal Problem:   Aphasia Active Problems:   Stroke (cerebrum) (HCC)   Acute Ischemic Infarct:  left occipitotemporal infarct  Etiology: Embolic Code Stroke CT head- Acute/early subacute nonhemorrhagic left occipitotemporal infarct. There sulcal effacement but no  significant mass effect. Atrophy and small-vessel disease.  Otherwise stable exam. Patchy sclerosis and thickening of the  squamous portion of the left temporal bone. No change since the 04/28/2024 CT, and appears to have been present on a 12/28/2021 MRI brain although not as well depicted. This could represent pagetoid changes of the bone or changes due to fibrous dysplasia, but a bone metastasis is not strictly excluded. 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 Occlusive thrombus in the small saphenous vein. 2D Echo shows EF of 60 to 65%, no diastolic dysfunction and no PFO. LDL 73 but goal<70-not on any antilipid.  Continue atorvastatin  20 mg. HgbA1c 5.2 VTE prophylaxis - SCDs Eliquis  (apixaban ) daily prior to admission, resumed eliquis . Therapy recommendations: CIR Disposition:  CIR-being discharged today.   DVT Started on Eliquis  on 9/9   Anemia: Reportedly her anemia is considered to be chronic.  She presented with hemoglobin of 5.6.  FOBT x 1 was negative.  GI was consulted and previous hospitalist Dr. Royal discussed with Dr. Stacia via secure chat who reviewed the chart and felt that work-up is low-yield without active bleed and neg Hemoccult, and would hold on anesthesia and GI work-up at this time given recent CVA.  Patient received 2 units of PRBC on 07/23/2024, patient's hemoglobin has remained stable over 8 ever since.   Hypertension/sinus tachycardia: Blood pressure stable however patient has intermittent sinus tachycardia, patient appears to be on 50 mg of Lopressor  PTA however here she is on 25 mg p.o. twice daily.  Discharging back on 50 mg PTA dose.   Dysphagia Seen by SLP, now on regular diet and tolerating.   Fever: Patient spiked fever of 101.2 07/29/2024 at 8 AM.  Patient had no complaints at all.  Denied urinary complaints or respiratory complaints and appears comfortable.  Unsure if this is true fever or an adder.  Patient is afebrile ever since.  We monitor her off of antibiotics.  Right shoulder pain: Complains of mild left shoulder pain.  Has tenderness at the tip of the shoulder as well.  No other skin lesion, no warmth.  No fever.  No signs of infection.  Could be positional/musculoskeletal.  Advised to exercise.  Hopefully this will get her with physical therapy.  Discharge Diagnoses:  Principal Problem:   Aphasia Active Problems:   Stroke (cerebrum) [facility])    Discharge Instructions   Allergies as of 07/31/2024       Reactions   Shellfish Allergy         Medication List     TAKE these medications    atorvastatin  20 MG tablet Commonly known as: LIPITOR Take 1 tablet (20 mg total) by mouth daily. Start taking on: August 01, 2024   dorzolamide  2 % ophthalmic solution Commonly known as: TRUSOPT  Place 1 drop into both  eyes 3 (three) times daily.   Eliquis  5 MG Tabs tablet Generic drug: apixaban  Take 5 mg by mouth 2 (two) times daily.   Lumigan 0.01 % Soln Generic drug: bimatoprost Place 1 drop into both eyes at bedtime.   metoprolol  tartrate 50 MG tablet Commonly known as: LOPRESSOR  Take 50 mg by mouth daily.   mirtazapine  7.5 MG tablet Commonly known as: REMERON  Take 7.5 mg by mouth at bedtime.   PREBIOTIC PRODUCT PO Take 1 capsule by mouth daily.   predniSONE  5 MG tablet Commonly known as: DELTASONE  Take 5 mg by mouth daily.   PROBIOTIC PO Take 1 capsule by mouth daily.        Follow-up  Information     Benjamine Aland, MD Follow up in 1 week(s).   Specialty: Family Medicine Contact information: [address] [place], [address] [place] [address] [phone]         [facility] Follow up in 1 month(s).   Contact information: [address]     [address] [place]   [address] [phone]               Allergies  Allergen Reactions   Shellfish Allergy     Consultations: Neurology   Procedures/Studies: VAS US  LOWER EXTREMITY VENOUS (DVT) Result Date: 07/25/2024  Lower Venous DVT Study Patient Name:  Tiffany [name]  Date of Exam:   07/25/2024 Medical Rec #: [id number]         Accession #:    [id number] Date of Birth: 04-Oct-1941          Patient Gender: F Patient Age:   66 years Exam Location:  [facility] Procedure:      VAS US  LOWER EXTREMITY VENOUS (DVT) Referring Phys: HARLENE BOWL --------------------------------------------------------------------------------  Indications: Superficial venous thrombosis (SVT) I82.819.  Risk Factors: None identified. Comparison Study: No prior studies. Performing Technologist: Cordella Collet RVT  Examination Guidelines: A complete evaluation includes B-mode imaging, spectral Doppler, color Doppler, and power Doppler as needed of all accessible portions of each vessel. Bilateral testing is considered  an integral part of a complete examination. Limited examinations for reoccurring indications may be performed as noted. The reflux portion of the exam is performed with the patient in reverse Trendelenburg.  +---------+---------------+---------+-----------+----------+--------------+ RIGHT    CompressibilityPhasicitySpontaneityPropertiesThrombus Aging +---------+---------------+---------+-----------+----------+--------------+ CFV      Full           Yes      Yes                                 +---------+---------------+---------+-----------+----------+--------------+ SFJ      Full                                                        +---------+---------------+---------+-----------+----------+--------------+ FV Prox  Full                                                        +---------+---------------+---------+-----------+----------+--------------+ FV Mid   Full                                                        +---------+---------------+---------+-----------+----------+--------------+ FV DistalFull                                                        +---------+---------------+---------+-----------+----------+--------------+ PFV      Full                                                        +---------+---------------+---------+-----------+----------+--------------+  POP      None           No       No                   Acute          +---------+---------------+---------+-----------+----------+--------------+ PTV      Partial                                      Acute          +---------+---------------+---------+-----------+----------+--------------+ PERO     None                                         Acute          +---------+---------------+---------+-----------+----------+--------------+ Gastroc  Full                                                         +---------+---------------+---------+-----------+----------+--------------+   +----+---------------+---------+-----------+----------+--------------+ LEFTCompressibilityPhasicitySpontaneityPropertiesThrombus Aging +----+---------------+---------+-----------+----------+--------------+ CFV Full           Yes      Yes                                 +----+---------------+---------+-----------+----------+--------------+    Summary: RIGHT: - Findings consistent with acute deep vein thrombosis involving the right popliteal vein, right posterior tibial veins, and right peroneal veins.  - No cystic structure found in the popliteal fossa.  LEFT: - No evidence of common femoral vein obstruction.   *See table(s) above for measurements and observations. Electronically signed by Debby Robertson on 07/25/2024 at 5:14:24 PM.    Final    ECHOCARDIOGRAM COMPLETE Result Date: 07/24/2024    ECHOCARDIOGRAM REPORT   Patient Name:   Tiffany [name] Date of Exam: 07/24/2024 Medical Rec #:  [id number]        Height:       60.0 in Accession #:    [id number]       Weight:       130.0 lb Date of Birth:  10-Jul-1941         BSA:          1.554 m Patient Age:    83 years         BP:           122/67 mmHg Patient Gender: F                HR:           96 bpm. Exam Location:  Inpatient Procedure: 2D Echo, Cardiac Doppler and Color Doppler (Both Spectral and Color            Flow Doppler were utilized during procedure). Indications:   Abnormal ECG R94.31  History:       Patient has no prior history of Echocardiogram examinations.                Stroke.  Sonographer:   Thea Norlander RCS Referring      [facility] SAMTANI Phys: IMPRESSIONS  1. Left ventricular ejection fraction, by estimation,  is 60 to 65%. The left ventricle has normal function. The left ventricle has no regional wall motion abnormalities. Left ventricular diastolic parameters were normal.  2. Right ventricular systolic function is normal. The right ventricular size is  normal.  3. The mitral valve is normal in structure. No evidence of mitral valve regurgitation. No evidence of mitral stenosis.  4. The aortic valve is abnormal. There is mild thickening of the aortic valve with a small subcentimeter echodensity at the LV side of the valve. There is mild aortic valve regurgitation. Consider TEE for further evaluation in setting of recent CVA.  5. The inferior vena cava is normal in size with greater than 50% respiratory variability, suggesting right atrial pressure of 3 mmHg. FINDINGS  Left Ventricle: Left ventricular ejection fraction, by estimation, is 60 to 65%. The left ventricle has normal function. The left ventricle has no regional wall motion abnormalities. The left ventricular internal cavity size was normal in size. There is  no left ventricular hypertrophy. Left ventricular diastolic parameters were normal. Right Ventricle: The right ventricular size is normal. No increase in right ventricular wall thickness. Right ventricular systolic function is normal. Left Atrium: Left atrial size was normal in size. Right Atrium: Right atrial size was normal in size. Pericardium: There is no evidence of pericardial effusion. Mitral Valve: The mitral valve is normal in structure. No evidence of mitral valve regurgitation. No evidence of mitral valve stenosis. Tricuspid Valve: The tricuspid valve is normal in structure. Tricuspid valve regurgitation is not demonstrated. No evidence of tricuspid stenosis. Aortic Valve: The aortic valve is abnormal. There is mild thickening of the aortic valve. Aortic valve regurgitation is mild. No aortic stenosis is present. Aortic valve peak gradient measures 10.4 mmHg. Pulmonic Valve: The pulmonic valve was normal in structure. Pulmonic valve regurgitation is not visualized. No evidence of pulmonic stenosis. Aorta: The aortic root is normal in size and structure. Venous: The inferior vena cava is normal in size with greater than 50% respiratory  variability, suggesting right atrial pressure of 3 mmHg. IAS/Shunts: No atrial level shunt detected by color flow Doppler.  LEFT VENTRICLE PLAX 2D LVIDd:         4.20 cm   Diastology LVIDs:         2.50 cm   LV e' medial:    9.46 cm/s LV PW:         0.90 cm   LV E/e' medial:  6.6 LV IVS:        0.90 cm   LV e' lateral:   14.70 cm/s LVOT diam:     2.00 cm   LV E/e' lateral: 4.2 LV SV:         51 LV SV Index:   33 LVOT Area:     3.14 cm  RIGHT VENTRICLE             IVC RV S prime:     13.80 cm/s  IVC diam: 1.40 cm TAPSE (M-mode): 1.9 cm LEFT ATRIUM             Index        RIGHT ATRIUM           Index LA diam:        4.30 cm 2.77 cm/m   RA Area:     10.50 cm LA Vol (A2C):   39.8 ml 25.61 ml/m  RA Volume:   18.63 ml  11.99 ml/m LA Vol (A4C):   26.8 ml 17.24 ml/m LA  Biplane Vol: 30.1 ml 19.37 ml/m  AORTIC VALVE AV Area (Vmax): 1.92 cm AV Vmax:        161.00 cm/s AV Peak Grad:   10.4 mmHg LVOT Vmax:      98.50 cm/s LVOT Vmean:     65.200 cm/s LVOT VTI:       0.163 m  AORTA Ao Root diam: 3.00 cm Ao Asc diam:  3.60 cm MITRAL VALVE MV Area (PHT): 5.31 cm    SHUNTS MV Decel Time: 143 msec    Systemic VTI:  0.16 m MV E velocity: 62.40 cm/s  Systemic Diam: 2.00 cm MV A velocity: 96.40 cm/s MV E/A ratio:  0.65 Aditya Sabharwal Electronically signed by Ria Commander Signature Date/Time: 07/24/2024/3:45:09 PM    Final    MR BRAIN WO CONTRAST Result Date: 07/24/2024 CLINICAL DATA:  Provided history: Stroke, follow-up. EXAM: MRI HEAD WITHOUT CONTRAST TECHNIQUE: Multiplanar, multiecho pulse sequences of the brain and surrounding structures were obtained without intravenous contrast. COMPARISON:  Non-contrast head CT and CT angiogram head/neck 07/23/2024. FINDINGS: Intermittently motion degraded examination. Most notably, the axial T1 sequence is moderate to severely motion degraded and the coronal T2 sequence is severely motion degraded. Within this limitation, findings are swallows. Brain: Generalized cerebral  atrophy. Large acute left MCA territory infarct affecting the posterior insula, posterior frontal lobe, parietal lobe and occipital lobe. This infarct does not appear significantly changed in extent as compared to yesterday's head CT. No significant mass effect at this time. No evidence of hemorrhagic conversion. Tiny acute cortical infarct within the perirolandic right frontoparietal region. Tiny acute infarct within the right parietal white matter (series 2, image 36). Mild multifocal T2 FLAIR hyperintense signal abnormality elsewhere within the cerebral white matter and pons, nonspecific but compatible chronic small ischemic disease. Redemonstrated chronic microhemorrhage within the left occipital lobe. Tiny acute infarcts within the bilateral cerebellar hemispheres. probable small (2-3 mm) meningioma along the right petrous apex better appreciated on the prior brain MRI of 12/28/2021 (series 6, image 8 of the current exam). No extra-axial fluid collection. No midline shift. Vascular: Please see the CTA head/neck performed yesterday. Skull and upper cervical spine: No focal race marrow lesion. Sinuses/Orbits: No mass or acute finding within the imaged orbits. Prior bilateral ocular lens replacement. No significant paranasal sinus disease. IMPRESSION: 1. Intermittently motion degraded examination as described. Within this limitation, findings are as follows. 2. Large acute left MCA territory infarct, as described and not significantly changed in extent since yesterday's head CT. No significant mass effect at this time. No evidence of hemorrhagic conversion. 3. Additional tiny acute infarcts within the perirolandic right frontoparietal cortex, right parietal white matter and bilateral cerebellar hemispheres. 4. Background parenchymal atrophy and chronic small vessel ischemic disease. 5. 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 Electronically  Signed   By: Rockey Childs D.O.   On: 07/24/2024 13:13   CT ANGIO HEAD NECK W WO CM Result Date: 07/23/2024 CLINICAL DATA:  83 year old female with confusion, abnormal speech. Recently started on Eliquis . Severe anemia. Cytotoxic edema in the posterior left hemisphere on head CT last night. EXAM: CT ANGIOGRAPHY HEAD AND NECK WITH AND WITHOUT CONTRAST TECHNIQUE: Multidetector CT imaging of the head and neck was performed using the standard protocol during bolus administration of intravenous contrast. Multiplanar CT image reconstructions and MIPs were obtained to evaluate the vascular anatomy. Carotid stenosis measurements (when applicable) are obtained utilizing NASCET criteria, using the distal internal carotid  diameter as the denominator. RADIATION DOSE REDUCTION: This exam was performed according to the departmental dose-optimization program which includes automated exposure control, adjustment of the mA and/or kV according to patient size and/or use of iterative reconstruction technique. CONTRAST:  OMNIPAQUE  IOHEXOL  350 MG/ML SOLN COMPARISON:  Head CT 2302 hours yesterday. FINDINGS: CT HEAD Brain: Confluent cytotoxic edema posterior left posterior MCA versus MCA/PCA watershed area appears stable and size and extent since last night. No associated mass effect. No hemorrhagic transformation. Stable gray-white matter differentiation throughout the brain. No intracranial mass effect or midline shift. Stable nonspecific ventriculomegaly. Basilar cisterns remain normal. Calvarium and skull base: Intact. No acute osseous abnormality identified. Paranasal sinuses: Visualized paranasal sinuses and mastoids are stable and well aerated. Orbits: No gaze deviation.  Stable orbit and scalp soft tissues. CTA NECK Skeleton: Levoconvex cervical scoliosis. No acute osseous abnormality identified. Incidental benign appearing right subscapularis intramuscular lipoma (series 5, image 303). Upper chest: Negative. Other neck:  Nonvascular neck soft tissue spaces appear negative. Aortic arch: Mild arch tortuosity and calcified plaque. Bovine arch configuration. Right carotid system: Tortuous brachiocephalic artery and right CCA origin without plaque or stenosis. Negative right carotid bifurcation. No stenosis. Left carotid system: Bovine left CCA origin with tortuosity. Mild calcified plaque at the posterior left ICA origin. Mildly tortuous left ICA without stenosis. Vertebral arteries: Tortuous proximal right subclavian artery without plaque or stenosis. Minimal calcified plaque at the right vertebral artery origin without stenosis. Tortuous right vertebral artery is patent to the neck with no other plaque or stenosis. Proximal left subclavian artery atherosclerosis and tortuosity, less than 50 % stenosis with respect to the distal vessel. Normal left vertebral artery origin. Tortuous left V1 segment. Codominant left vertebral artery is tortuous but patent without stenosis to the skull base. CTA HEAD Posterior circulation: Codominant distal vertebral arteries and vertebrobasilar junction are patent. Distal left V4 calcified plaque with only mild stenosis. Bilateral AICA appear dominant and patent. Patent basilar artery with mild mid basilar irregularity. No significant basilar stenosis. Patent SCA and fetal type bilateral PCA origins. Bilateral PCA branches are tortuous with mild irregularity. No significant PCA stenosis, mild bilateral P2 segment irregularity and stenosis (series 22, image 27 on the right). Anterior circulation: Both ICA siphons are patent. Left siphon mild ectasia and calcified plaque without stenosis. Normal left posterior communicating artery origin. Similar right siphon ectasia and mild calcified plaque without stenosis. Normal right posterior communicating artery origin. Patent carotid termini. Normal MCA and ACA origins. Diminutive anterior communicating artery. Bilateral ACA branches are within normal limits.  Right MCA M1 segment is tortuous, right MCA trifurcation is patent without stenosis. Right MCA branches are within normal limits. Left MCA M1 segment bifurcates early without stenosis. There is a posterior left MCA branch occlusion, appears to be M3 or M4 on series 22, image 37. Other left MCA branches are within normal limits. Venous sinuses: Patent. Anatomic variants: Fetal type PCA origins. Bovine arch configuration. Review of the MIP images confirms the above findings IMPRESSION: 1. Negative for large vessel occlusion. 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 2. 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 No hemorrhagic transformation or intracranial mass effect. 3. Generalized arterial tortuosity in the head and neck with mild for age atherosclerosis. No other significant stenosis. 4.  Aortic Atherosclerosis (ICD10-I70.0). Electronically Signed   By: VEAR Hurst M.D.   On: 07/23/2024 09:30   CT HEAD WO CONTRAST Result  Date: 07/22/2024 CLINICAL DATA:  Memory loss and altered mental status. EXAM: CT HEAD WITHOUT CONTRAST TECHNIQUE: Contiguous axial images were obtained from the base of the skull through the vertex without intravenous contrast. RADIATION DOSE REDUCTION: This exam was performed according to the departmental dose-optimization program which includes automated exposure control, adjustment of the mA and/or kV according to patient size and/or use of iterative reconstruction technique. COMPARISON:  Head CT 04/28/2024. FINDINGS: Brain: There is an acute/early subacute left occipitotemporal nonhemorrhagic infarct involving the posterior temporal lobe from the posterior sylvian cortex, extending contiguously posteriorly to the lateral occipital lobe over an area measuring 7.1 cm AP, 3.0 cm coronal, 3.3 cm in height. There is hyperdensity of the infarcting tissue, loss of sulci and loss of gray-white matter differentiation. The  infarct extends from the cortical surface to the ventricular interface, without hemorrhage or downward mass effect or notable effacement of the posterior horn of the left ventricle. There is no further evidence of infarcts. There is mild cerebral atrophy and small-vessel disease and mild-to-moderate atrophic ventriculomegaly which was seen previously. Cerebellum and brainstem are unremarkable. No midline shift. Basal cisterns are clear. Vascular: Despite the findings I do not see a hyperdense vessel. There are calcific plaques both siphons, distal left vertebral artery. Skull: Negative for fractures. There is patchy sclerosis and thickening of the squamous portion of the left temporal bone. In retrospect this was present on the prior CT and it seems to have been present on MRI from 12/28/2021. This could represent pagetoid changes of the bone or changes due to fibrous dysplasia, but a bone metastasis is not strictly excluded. Rest of the calvarium is unremarkable. Sinuses/Orbits: No acute finding. Old lens replacements. Clear sinuses and mastoids. Other: None. IMPRESSION: 1. 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 There sulcal effacement but no significant mass effect. 2. Atrophy and small-vessel disease.  Otherwise stable exam. 3. Patchy sclerosis and thickening of the squamous portion of the left temporal bone. No change since the 04/28/2024 CT, and appears to have been present on a 12/28/2021 MRI brain although not as well depicted. This could represent pagetoid changes of the bone or changes due to fibrous dysplasia, but a bone metastasis is not strictly excluded. 4. PRA is attempting to reach the ordering physician for stat notification at the time of signing. Electronically Signed   By: Francis Quam M.D.   On: 07/22/2024 23:32     Discharge Exam: Vitals:   07/31/24 0852 07/31/24 1125  BP: 111/67 103/68  Pulse: (!) 116 94  Resp: 19 19  Temp: 98.9 F (37.2 C) 98.7 F (37.1  C)  SpO2: 100% 100%   Vitals:   07/31/24 0007 07/31/24 0424 07/31/24 0852 07/31/24 1125  BP: 117/68 (!) 115/53 111/67 103/68  Pulse: 82 86 (!) 116 94  Resp: 19 19 19 19   Temp: 98.5 F (36.9 C) 98.6 F (37 C) 98.9 F (37.2 C) 98.7 F (37.1 C)  TempSrc: Oral Oral Oral   SpO2: 100% 100% 100% 100%  Weight:      Height:        General: Pt is alert, awake, not in acute distress Cardiovascular: RRR, S1/S2 +, no rubs, no gallops Respiratory: CTA bilaterally, no wheezing, no rhonchi Abdominal: Soft, NT, ND, bowel sounds + Extremities: no edema, no cyanosis    The results of significant diagnostics from this hospitalization (including imaging, microbiology, ancillary and laboratory) are listed below for reference.     Microbiology: No results found for this or any  previous visit (from the past 240 hours).   Labs: BNP (last 3 results) No results for input(s): BNP in the last 8760 hours. Basic Metabolic Panel: Recent Labs  Lab 07/25/24 0434 07/25/24 1432 07/30/24 0454  NA 140 140 138  K 3.6 3.7 3.7  CL 108 106 106  CO2 21* 21* 19*  GLUCOSE 100* 110* 102*  BUN 9 13 23   CREATININE 0.91 0.87 0.90  CALCIUM  8.6* 8.8* 8.6*   Liver Function Tests: No results for input(s): AST, ALT, ALKPHOS, BILITOT, PROT, ALBUMIN in the last 168 hours. No results for input(s): LIPASE, AMYLASE in the last 168 hours. No results for input(s): AMMONIA in the last 168 hours. CBC: Recent Labs  Lab 07/26/24 0212 07/27/24 0453 07/28/24 0217 07/29/24 0447 07/30/24 0454  WBC 14.5* 14.7* 14.9* 14.7* 16.3*  NEUTROABS 9.3* 10.0* 9.6* 9.5* 10.8*  HGB 8.4* 8.9* 8.4* 8.4* 8.7*  HCT 27.5* 29.2* 27.2* 27.5* 27.6*  MCV 79.5* 80.0 79.5* 79.3* 78.0*  PLT 386 393 349 367 390   Cardiac Enzymes: No results for input(s): CKTOTAL, CKMB, CKMBINDEX, TROPONINI in the last 168 hours. BNP: Invalid input(s): POCBNP CBG: No results for input(s): GLUCAP in the last 168  hours. D-Dimer No results for input(s): DDIMER in the last 72 hours. Hgb A1c No results for input(s): HGBA1C in the last 72 hours. Lipid Profile No results for input(s): CHOL, HDL, LDLCALC, TRIG, CHOLHDL, LDLDIRECT in the last 72 hours. Thyroid function studies No results for input(s): TSH, T4TOTAL, T3FREE, THYROIDAB in the last 72 hours.  Invalid input(s): FREET3 Anemia work up No results for input(s): VITAMINB12, FOLATE, FERRITIN, TIBC, IRON, RETICCTPCT in the last 72 hours. Urinalysis    Component Value Date/Time   COLORURINE YELLOW 07/22/2024 2230   APPEARANCEUR HAZY (A) 07/22/2024 2230   LABSPEC >1.030 (H) 07/22/2024 2230   PHURINE 6.0 07/22/2024 2230   GLUCOSEU NEGATIVE 07/22/2024 2230   HGBUR NEGATIVE 07/22/2024 2230   BILIRUBINUR SMALL (A) 07/22/2024 2230   KETONESUR 40 (A) 07/22/2024 2230   PROTEINUR 100 (A) 07/22/2024 2230   NITRITE NEGATIVE 07/22/2024 2230   LEUKOCYTESUR SMALL (A) 07/22/2024 2230   Sepsis Labs Recent Labs  Lab 07/27/24 0453 07/28/24 0217 07/29/24 0447 07/30/24 0454  WBC 14.7* 14.9* 14.7* 16.3*   Microbiology No results found for this or any previous visit (from the past 240 hours).  FURTHER DISCHARGE INSTRUCTIONS:   Get Medicines reviewed and adjusted: Please take all your medications with you for your next visit with your Primary MD   Laboratory/radiological data: Please request your Primary MD to go over all hospital tests and procedure/radiological results at the follow up, please ask your Primary MD to get all Hospital records sent to his/her office.   In some cases, they will be blood work, cultures and biopsy results pending at the time of your discharge. Please request that your primary care M.D. goes through all the records of your hospital data and follows up on these results.   Also Note the following: If you experience worsening of your admission symptoms, develop shortness of breath, life  threatening emergency, suicidal or homicidal thoughts you must seek medical attention immediately by calling 911 or calling your MD immediately  if symptoms less severe.   You must read complete instructions/literature along with all the possible adverse reactions/side effects for all the Medicines you take and that have been prescribed to you. Take any new Medicines after you have completely understood and accpet all the possible adverse reactions/side effects.  patient was instructed, not to drive, operate heavy machinery, perform activities at heights, swimming or participation in water activities or provide baby-sitting services while on Pain, Sleep and Anxiety Medications; until their outpatient Physician has advised to do so again. Also recommended to not to take more than prescribed Pain, Sleep and Anxiety Medications.  It is not advisable to combine anxiety, sleep and pain medications without talking with your primary care provider.     Wear Seat belts while driving.   Please note: You were cared for by a hospitalist during your hospital stay. Once you are discharged, your primary care physician will handle any further medical issues. Please note that NO REFILLS for any discharge medications will be authorized once you are discharged, as it is imperative that you return to your primary care physician (or establish a relationship with a primary care physician if you do not have one) for your post hospital discharge needs so that they can reassess your need for medications and monitor your lab values  Time coordinating discharge: Over 30 minutes  SIGNED:   Fredia Skeeter, MD  [facility] 07/31/2024, 12:08 PM *Please note that this is a verbal dictation therefore any spelling or grammatical errors are due to the Dragon Medical One system interpretation. If 7PM-7AM, please contact night-coverage [email or web address]

## 2024-07-31 NOTE — Progress Notes (Signed)
 Inpatient Rehab Admissions Coordinator:    I have insurance approval and a bed available for pt to admit to CIR today. Dr. Vernon in agreement and [facility] aware.  I've notified pt/family and they are in agreement to admit today.  I will make arrangements.    Reche Lowers, PT, DPT Admissions Coordinator [phone] 07/31/24  11:35 AM

## 2024-07-31 NOTE — TOC Transition Note (Signed)
 Transition of Care [facility]) - Discharge Note   Patient Details  Name: Tiffany [name] MRN: [id number] Date of Birth: May 10, 1941  Transition of Care [facility]) CM/SW Contact:  [name], RN Phone Number: 07/31/2024, 11:34 AM   Clinical Narrative:     Pt is discharging to CIR today. She won her appeal.  No further needs per IP Care Management.  Final next level of care: IP Rehab Facility Barriers to Discharge: No Barriers Identified   Patient Goals and CMS Choice   CMS Medicare.gov Compare Post Acute Care list provided to:: Patient Choice offered to / list presented to : Patient, Adult Children      Discharge Placement                       Discharge Plan and Services Additional resources added to the After Visit Summary for     Discharge Planning Services: CM Consult Post Acute Care Choice: IP Rehab                               Social Drivers of Health (SDOH) Interventions SDOH Screenings   Social Connections: Patient Unable To Answer (07/23/2024)  Tobacco Use: Low Risk  (07/22/2024)     Readmission Risk Interventions     No data to display

## 2024-08-01 ENCOUNTER — Inpatient Hospital Stay (HOSPITAL_COMMUNITY)

## 2024-08-01 DIAGNOSIS — I63512 Cerebral infarction due to unspecified occlusion or stenosis of left middle cerebral artery: Secondary | ICD-10-CM | POA: Diagnosis not present

## 2024-08-01 LAB — CBC WITH DIFFERENTIAL/PLATELET
Abs Immature Granulocytes: 0.24 K/uL — ABNORMAL HIGH (ref 0.00–0.07)
Basophils Absolute: 0.1 K/uL (ref 0.0–0.1)
Basophils Relative: 1 %
Eosinophils Absolute: 0.2 K/uL (ref 0.0–0.5)
Eosinophils Relative: 2 %
HCT: 23.8 % — ABNORMAL LOW (ref 36.0–46.0)
Hemoglobin: 7.3 g/dL — ABNORMAL LOW (ref 12.0–15.0)
Immature Granulocytes: 2 %
Lymphocytes Relative: 17 %
Lymphs Abs: 2.5 K/uL (ref 0.7–4.0)
MCH: 24.2 pg — ABNORMAL LOW (ref 26.0–34.0)
MCHC: 30.7 g/dL (ref 30.0–36.0)
MCV: 78.8 fL — ABNORMAL LOW (ref 80.0–100.0)
Monocytes Absolute: 1.9 K/uL — ABNORMAL HIGH (ref 0.1–1.0)
Monocytes Relative: 13 %
Neutro Abs: 9.6 K/uL — ABNORMAL HIGH (ref 1.7–7.7)
Neutrophils Relative %: 65 %
Platelets: 385 K/uL (ref 150–400)
RBC: 3.02 MIL/uL — ABNORMAL LOW (ref 3.87–5.11)
RDW: 17.9 % — ABNORMAL HIGH (ref 11.5–15.5)
WBC: 14.6 K/uL — ABNORMAL HIGH (ref 4.0–10.5)
nRBC: 0 % (ref 0.0–0.2)

## 2024-08-01 LAB — COMPREHENSIVE METABOLIC PANEL WITH GFR
ALT: 28 U/L (ref 0–44)
AST: 37 U/L (ref 15–41)
Albumin: 2.1 g/dL — ABNORMAL LOW (ref 3.5–5.0)
Alkaline Phosphatase: 171 U/L — ABNORMAL HIGH (ref 38–126)
Anion gap: 13 (ref 5–15)
BUN: 18 mg/dL (ref 8–23)
CO2: 21 mmol/L — ABNORMAL LOW (ref 22–32)
Calcium: 8.6 mg/dL — ABNORMAL LOW (ref 8.9–10.3)
Chloride: 105 mmol/L (ref 98–111)
Creatinine, Ser: 0.85 mg/dL (ref 0.44–1.00)
GFR, Estimated: >60 mL/min (ref >60)
Glucose, Bld: 91 mg/dL (ref 70–99)
Potassium: 3.7 mmol/L (ref 3.5–5.1)
Sodium: 139 mmol/L (ref 135–145)
Total Bilirubin: 0.5 mg/dL (ref 0.0–1.2)
Total Protein: 6.2 g/dL — ABNORMAL LOW (ref 6.5–8.1)

## 2024-08-01 MED ORDER — IOHEXOL 350 MG/ML SOLN
60.0000 mL | Freq: Once | INTRAVENOUS | Status: AC | PRN
  Administered 2024-08-01: 60 mL via INTRAVENOUS

## 2024-08-01 MED ORDER — APIXABAN 5 MG PO TABS
5.0000 mg | ORAL_TABLET | Freq: Two times a day (BID) | ORAL | Status: DC
  Administered 2024-08-01: 5 mg via ORAL
  Filled 2024-08-01: qty 1

## 2024-08-01 MED ORDER — FLUOXETINE HCL 20 MG PO CAPS
20.0000 mg | ORAL_CAPSULE | Freq: Every day | ORAL | Status: DC
  Administered 2024-08-02: 20 mg via ORAL
  Filled 2024-08-01: qty 1

## 2024-08-01 MED ORDER — HEPARIN (PORCINE) 25000 UT/250ML-% IV SOLN
750.0000 [IU]/h | INTRAVENOUS | Status: AC
Start: 2024-08-01 — End: 2024-08-03
  Administered 2024-08-01: 500 [IU]/h via INTRAVENOUS
  Filled 2024-08-01: qty 250

## 2024-08-01 NOTE — Evaluation (Signed)
 Physical Therapy Assessment and Plan  Patient Details  Name: Tiffany [name] MRN: [id number] Date of Birth: October 21, 1941  PT Diagnosis: Abnormality of gait, Coordination disorder, Difficulty walking, Impaired cognition, and Muscle weakness Rehab Potential: Good ELOS: 10 days   Today's Date: 08/01/2024 PT Individual Time: 1101-1156 PT Individual Time Calculation (min): 55 min    Hospital Problem: Principal Problem:   Acute ischemic left middle cerebral artery (MCA) stroke (HCC)   Past Medical History:  Past Medical History:  Diagnosis Date   Hypertension    Past Surgical History: History reviewed. No pertinent surgical history.  Assessment & Plan Clinical Impression: Patient is a 83 year old female with history of HTN, syncope, pre-diabetes, anemia,  glaucoma, recent diagnosis of DVT- on eliquis ;  who was admitted on 07/23/24 with gabled speech and confusion. 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  CTA negative for LVO and showed posterior L-MCA M3 or M4 brach occlusion. MRI brain revealed large acute L-MCA territory infarct without changes,  patchy sclerosis and thickening squamous portion of left temporal bone and additional tiny acute infarcts in right perirolantic and frontotemporal cortex, right parietal and bilateral cerebellar hemispheres.  2 D echo showed EF 60-65% with no wall abnormality and mild AVR without stenosis. She was transfused with 2 units PRBC and anemia felt to be chronic and to monitor H/H w/ outpatient colonoscopy recommended per discussion with Dr. Stacia.   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 Eliquis  resumed on 09/22 with recommendations of 30 day event monitor after d/c to rule out A fib.   Repeat BLE dopplers done revealing acute DVT right poplitea, peroneal and posterior tib veins 09/23. She has had issues with fever 101.2 on 09/27 as  well as ongoing issues with tachycardia which was treated with IVF and monitored off antibiotics. PT/OT/ST consulted and patient noted to have expressive deficits, requires min to mod assist with ADL tasks with difficulty sequencing and initiation--question apraxia as well as  CGA- min assist with mobility. CIR recommended due to functional decline.   Patient currently requires min with mobility secondary to muscle weakness, decreased cardiorespiratoy endurance, motor apraxia, decreased coordination, and decreased motor planning, decreased visual perceptual skills, decreased attention to right, decreased awareness, decreased safety awareness, and delayed processing, and decreased standing balance, decreased postural control, and decreased balance strategies.  Prior to hospitalization, patient was independent  with mobility and lived with Daughter in a House home.  Home access is 5 STEStairs to enter.  Patient will benefit from skilled PT intervention to maximize safe functional mobility, minimize fall risk, and decrease caregiver burden for planned discharge home with 24 hour supervision.  Anticipate patient will benefit from follow up OP at discharge.  PT - End of Session Activity Tolerance: Tolerates 30+ min activity with multiple rests Endurance Deficit: Yes Endurance Deficit Description: requires rest breaks with mobility tasks PT Assessment Rehab Potential (ACUTE/IP ONLY): Good PT Barriers to Discharge: Inaccessible home environment;Behavior PT Barriers to Discharge Comments: 5 STE, unsure assist level at home PT Patient demonstrates impairments in the following area(s): Balance;Behavior;Endurance;Motor;Perception;Safety;Sensory PT Transfers Functional Problem(s): Bed Mobility;Bed to Chair;Car PT Locomotion Functional Problem(s): Ambulation;Stairs PT Plan PT Intensity: Minimum of 1-2 x/day ,45 to 90 minutes PT Frequency: 5 out of 7 days PT Duration Estimated Length of Stay: 10 days PT  Treatment/Interventions: Ambulation/gait training;Disease management/prevention;Pain management;Stair training;Visual/perceptual remediation/compensation;Balance/vestibular training;DME/adaptive equipment instruction;Patient/family education;Therapeutic Activities;Cognitive remediation/compensation;Psychosocial support;Therapeutic  Exercise;Community reintegration;Functional mobility training;UE/LE Strength taining/ROM;Discharge planning;Neuromuscular re-education;Splinting/orthotics;UE/LE Coordination activities PT Transfers Anticipated Outcome(s): supervision PT Locomotion Anticipated Outcome(s): supervision PT Recommendation Recommendations for Other Services: None Follow Up Recommendations: Outpatient PT Patient destination: Home   PT Evaluation Precautions/Restrictions Precautions Precautions: Fall Recall of Precautions/Restrictions: Impaired Precaution/Restrictions Comments: Aphasia Restrictions Weight Bearing Restrictions Per Provider Order: No Pain Interference Pain Interference Pain Effect on Sleep: 1. Rarely or not at all Pain Interference with Therapy Activities: 1. Rarely or not at all Pain Interference with Day-to-Day Activities: 1. Rarely or not at all Home Living/Prior Functioning Home Living Available Help at Discharge: Family Type of Home: House Home Access: Stairs to enter [employer] of Steps: 5 STE Entrance Stairs-Rails: None Home Layout: Able to live on main level with bedroom/bathroom Bathroom Shower/Tub: Health visitor: Standard Bathroom Accessibility: Yes Additional Comments: Pt's daughter works Museum/gallery exhibitions officer; picks up weekday hours occasionally. Pt SIL is home Saturday and Sunday. Pt sister who lives in [place] is willing to come and help out. Information gathered from chart review due to patient's communciation deficits.  Lives With: Daughter Prior Function Level of Independence: Independent with basic ADLs;Independent with homemaking  with ambulation;Independent with gait;Independent with transfers  Able to Take Stairs?: Yes Driving: No Vocation: Retired Engineer, site: Impaired Preception Impairment Details: Inattention/Neglect Praxis Praxis: Impaired Praxis Impairment Details: Motor planning  Cognition Overall Cognitive Status: Impaired/Different from baseline Arousal/Alertness: Awake/alert Orientation Level: Oriented to person;Oriented to place;Disoriented to time Awareness: Impaired Executive Function: Chiropractor: Appears Intact Coordination Gross Motor Movements are Fluid and Coordinated: No Fine Motor Movements are Fluid and Coordinated: Yes Coordination and Movement Description: Deficits due to generalized debility and decreased activity tolerance as well as R inattention Motor  Motor Motor: Motor apraxia;Ataxia Motor - Skilled Clinical Observations: decreased coordination, possible apraxia however difficult to differentiate between apraxia and aphasia, and R inattention  Trunk/Postural Assessment  Cervical Assessment Cervical Assessment: Exceptions to [facility] (forward head, tendency toward L cervical rotation secondary to R inattention) Thoracic Assessment Thoracic Assessment: Exceptions to [facility] (rounded shoulders) Lumbar Assessment Lumbar Assessment: Within Functional Limits Postural Control Postural Control: Deficits on evaluation Righting Reactions: delayed and inadequate Protective Responses: decreased Postural Limitations: decreased  Balance Balance Balance Assessed: (P) Yes Standardized Balance Assessment Standardized Balance Assessment: Timed Up and Go Test Timed Up and Go Test TUG: Normal TUG Normal TUG (seconds): 40.98 Static Sitting Balance Static Sitting - Balance Support: Feet supported Static Sitting - Level of Assistance: 5: Stand by assistance Dynamic Sitting Balance Dynamic Sitting - Balance Support: Feet  supported;During functional activity Dynamic Sitting - Level of Assistance: 5: Stand by assistance Static Standing Balance Static Standing - Balance Support: During functional activity;No upper extremity supported Static Standing - Level of Assistance: 4: Min assist Dynamic Standing Balance Dynamic Standing - Balance Support: During functional activity;No upper extremity supported Dynamic Standing - Level of Assistance: 4: Min assist Extremity Assessment  RLE Assessment RLE Assessment: Exceptions to [facility] General Strength Comments: difficulty assessing formally due to aphasis and apraxia, grossly 3+/5 assessed functionally LLE Assessment LLE Assessment: Exceptions to [facility] General Strength Comments: difficulty assessing formally due to aphasis and apraxia, grossly 3+/5 assessed functionally  Care Tool Care Tool Bed Mobility Roll left and right activity   Roll left and right assist level: Supervision/Verbal cueing    Sit to lying activity   Sit to lying assist level: Supervision/Verbal cueing    Lying to sitting on side of bed activity   Lying to sitting on  side of bed assist level: the ability to move from lying on the back to sitting on the side of the bed with no back support.: Supervision/Verbal cueing     Care Tool Transfers Sit to stand transfer   Sit to stand assist level: Minimal Assistance - Patient > 75%    Chair/bed transfer   Chair/bed transfer assist level: Minimal Assistance - Patient > 75%    Car transfer   Car transfer assist level: Minimal Assistance - Patient > 75%      Care Tool Locomotion Ambulation   Assist level: Minimal Assistance - Patient > 75% Assistive device: No Device Max distance: 180'  Walk 10 feet activity   Assist level: Minimal Assistance - Patient > 75% Assistive device: No Device   Walk 50 feet with 2 turns activity   Assist level: Minimal Assistance - Patient > 75% Assistive device: No Device  Walk 150 feet activity   Assist level:  Minimal Assistance - Patient > 75% Assistive device: No Device  Walk 10 feet on uneven surfaces activity   Assist level: Minimal Assistance - Patient > 75% Assistive device: Other (comment) (No device, but holding to RHR)  Stairs   Assist level: Minimal Assistance - Patient > 75% Stairs assistive device: 2 hand rails Max number of stairs: 12  Walk up/down 1 step activity   Walk up/down 1 step (curb) assist level: Minimal Assistance - Patient > 75% Walk up/down 1 step or curb assistive device: 2 hand rails  Walk up/down 4 steps activity   Walk up/down 4 steps assist level: Minimal Assistance - Patient > 75% Walk up/down 4 steps assistive device: 2 hand rails  Walk up/down 12 steps activity   Walk up/down 12 steps assist level: Minimal Assistance - Patient > 75% Walk up/down 12 steps assistive device: 2 hand rails  Pick up small objects from floor Pick up small object from the floor (from standing position) activity did not occur: Safety/medical concerns      Wheelchair Is the patient using a wheelchair?: Yes Type of Wheelchair: Manual   Wheelchair assist level: Dependent - Patient 0%    Wheel 50 feet with 2 turns activity   Assist Level: Dependent - Patient 0%  Wheel 150 feet activity   Assist Level: Dependent - Patient 0%    Refer to Care Plan for Long Term Goals  SHORT TERM GOAL WEEK 1 PT Short Term Goal 1 (Week 1): STG = LTG due to ELOS  Recommendations for other services: None   Skilled Therapeutic Intervention Evaluation completed (see details above and below) with education on PT POC and goals and individual treatment initiated with focus on therapeutic activities for tolerance to upright, transfer training, and gait training for stair negotiation, gait over uneven ground. Pt does not report pain however pt demonstrating difficulty communicating pain secondary to aphasia. Pt in no acute distress during session but notable signs of fatigue with pt resting head on lap  between gait trials and at rest. Pt completes transfers with CGA/min assist, completes bed mobility with supervision. Pt completes gait 180' without device with min assist, demonstrating occasional scissoring and possible right sided inattention however therapist positioned on right side to increase awareness. Pt completes up/down 12 steps with BHRs with min assist, cues for foot placement on step. Pt requires increased time for processing verbal cues, difficulty differentiating between apraxia and aphasia. Pt completes car transfer with min assist and ambulates up/down ramp with RHR with min assist. Pt requires increased  time to complete mobility secondary to difficulty processing verbal cues, as well as frequent rest breaks secondary to fatigue. Pt completes TUG in 40.98 seconds, indicating increased risk of falls, pt requires cues for attention to task. Pt returns to room and completes transfer back to bed where she remains with all needs within reach, cal light in place and bed alarm activated at end of session.    Mobility Transfers Transfers: Sit to Stand;Stand to Sit;Stand Pivot Transfers Sit to Stand: Contact Guard/Touching assist;Minimal Assistance - Patient > 75% Stand to Sit: Contact Guard/Touching assist;Minimal Assistance - Patient > 75% Stand Pivot Transfers: Contact Guard/Touching assist;Minimal Assistance - Patient > 75% Transfer (Assistive device): None Locomotion  Gait Ambulation: Yes Gait Assistance: Minimal Assistance - Patient > 75% Gait Distance (Feet): 180 Feet Assistive device: None Gait Assistance Details: Verbal cues for sequencing;Verbal cues for technique;Verbal cues for precautions/safety;Verbal cues for gait pattern Gait Gait: Yes Gait Pattern: Impaired Gait Pattern: Scissoring;Trunk flexed;Poor foot clearance - right Gait velocity: decreased Stairs / Additional Locomotion Stairs: Yes Stairs Assistance: Minimal Assistance - Patient > 75% Stair Management  Technique: Two rails Number of Stairs: 12 Height of Stairs: 6 Wheelchair Mobility Wheelchair Mobility: No   Discharge Criteria: Patient will be discharged from PT if patient refuses treatment 3 consecutive times without medical reason, if treatment goals not met, if there is a change in medical status, if patient makes no progress towards goals or if patient is discharged from hospital.  The above assessment, treatment plan, treatment alternatives and goals were discussed and mutually agreed upon: by patient  Reche Ohara PT, DPT 08/01/2024, 12:08 PM

## 2024-08-01 NOTE — Progress Notes (Incomplete)
 Patient ID: Tiffany [name], female   DOB: [birth year], 83 y.o.   MRN: [id number]

## 2024-08-01 NOTE — Progress Notes (Signed)
 PHARMACY - ANTICOAGULATION CONSULT NOTE  Pharmacy Consult for Heparin Indication: pulmonary embolus and DVT  Allergies  Allergen Reactions   Elemental Sulfur Hives   Shellfish Allergy Anaphylaxis   Shellfish Allergy    Sulfa Antibiotics Hives    Patient Measurements: Height: 5' 2 (157.5 cm) Weight: 55.4 kg (122 lb 2.2 oz) IBW/kg (Calculated) : 50.1 HEPARIN DW (KG): 55.4  Vital Signs: Temp: 99.1 F (37.3 C) (09/30 2131) Temp Source: Oral (09/30 2131) BP: 119/66 (09/30 2131) Pulse Rate: 92 (09/30 2131)  Labs: Recent Labs    07/30/24 0454 08/01/24 0527  HGB 8.7* 7.3*  HCT 27.6* 23.8*  PLT 390 385  CREATININE 0.90 0.85    Estimated Creatinine Clearance: 40.4 mL/min (by C-G formula based on SCr of 0.85 mg/dL).   Medical History: Past Medical History:  Diagnosis Date   Hypertension     Medications:  Medications Prior to Admission  Medication Sig Dispense Refill Last Dose/Taking   alendronate (FOSAMAX) 70 MG tablet Take 70 mg by mouth once a week.      apixaban  (ELIQUIS ) 5 MG TABS tablet Take 5 mg by mouth 2 (two) times daily.      atorvastatin  (LIPITOR) 20 MG tablet Take 1 tablet (20 mg total) by mouth daily. 30 tablet 0    bimatoprost (LUMIGAN) 0.03 % ophthalmic solution Place 1 drop into both eyes at bedtime.      D3-50 1.25 MG (50000 UT) capsule Take 50,000 Units by mouth once a week. On mondays      dorzolamide  (TRUSOPT ) 2 % ophthalmic solution Place 1 drop into both eyes 3 (three) times daily.      LUMIGAN 0.01 % SOLN Place 1 drop into both eyes at bedtime.      metoprolol  tartrate (LOPRESSOR ) 50 MG tablet Take 50 mg by mouth daily.      mirtazapine  (REMERON ) 7.5 MG tablet Take 7.5 mg by mouth at bedtime.      PREBIOTIC PRODUCT PO Take 1 capsule by mouth daily.      predniSONE  (DELTASONE ) 5 MG tablet Take 5 mg by mouth daily.      Probiotic Product (PROBIOTIC PO) Take 1 capsule by mouth daily.      Infusions:   Assessment: 83 YOF. Recent DVT started on  eliquis  on 9/9. Concern for embolic stroke on 9/21 and CTA PE on 9/30 with emboli of unknown onset. Concern for eliquis  failure so pharmacy consulted to change to heparin infusion with no bolus. Last eliquis  dose 9/30 2029 so will start heparin infusion low and will aim for heparin level goal of 0.3-0.5 and aPTT of 66-85 given recent stroke. With recent eliquis  dosing will track both heparin levels and aPTT, but dose off of aPTT until correlating.  Goal of Therapy:  Heparin level 0.3-0.5 units/ml aPTT 66-85 seconds Monitor platelets by anticoagulation protocol: Yes   Plan:  Start heparin infusion at 500 units/hr Check anti-Xa/apTT level in 8 hours and daily while on heparin Continue to monitor H&H and platelets  Larraine Brazier, PharmD Clinical Pharmacist 08/01/2024  10:01 PM **Pharmacist phone directory can now be found on [email or web address] ([email or web address]).  Listed under [facility].

## 2024-08-01 NOTE — Evaluation (Signed)
 Occupational Therapy Assessment and Plan  Patient Details  Name: Tiffany [name] MRN: [id number] Date of Birth: 04-Apr-1941  OT Diagnosis: acute pain, cognitive deficits, muscle weakness (generalized), and decreased activity tolerance, decreased balance strategies Rehab Potential: Rehab Potential (ACUTE ONLY): Good ELOS: 10-12 days   Today's Date: 08/01/2024 OT Individual Time: 0920-1030 OT Individual Time Calculation (min): 70 min     Hospital Problem: Principal Problem:   Acute ischemic left middle cerebral artery (MCA) stroke (HCC)   Past Medical History:  Past Medical History:  Diagnosis Date   Hypertension    Past Surgical History: History reviewed. No pertinent surgical history.  Assessment & Plan Clinical Impression: Patient is an 83 year old female with history of HTN, syncope, pre-diabetes, anemia,  glaucoma, recent diagnosis of DVT- on eliquis ;  who was admitted on 07/23/24 with gabled speech and confusion. 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  CTA negative for LVO and showed posterior L-MCA M3 or M4 brach occlusion. MRI brain revealed large acute L-MCA territory infarct without changes,  patchy sclerosis and thickening squamous portion of left temporal bone and additional tiny acute infarcts in right perirolantic and frontotemporal cortex, right parietal and bilateral cerebellar hemispheres.  2 D echo showed EF 60-65% with no wall abnormality and mild AVR without stenosis. She was transfused with 2 units PRBC and anemia felt to be chronic and to monitor H/H w/ outpatient colonoscopy recommended per discussion with Dr. Stacia.   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 Eliquis  resumed on 09/22 with recommendations of 30 day event monitor after d/c to rule out A fib.   Repeat BLE dopplers done revealing acute DVT right poplitea, peroneal and posterior tib  veins 09/23. She has had issues with fever 101.2 on 09/27 as well as ongoing issues with tachycardia which was treated with IVF and monitored off antibiotics. Patient transferred to CIR on 07/31/2024 .    Patient currently requires CGA-Min A with basic self-care skills secondary to muscle weakness, decreased cardiorespiratoy endurance, decreased visual acuity and decreased visual perceptual skills, decreased awareness, decreased safety awareness, and decreased memory, and decreased standing balance and decreased balance strategies.  Prior to hospitalization, patient could complete BADLs with independently.  Patient will benefit from skilled intervention to increase independence with basic self-care skills prior to discharge home with care partner.  Anticipate patient will require 24 hour supervision and follow up outpatient.  OT - End of Session Activity Tolerance: Tolerates 10 - 20 min activity with multiple rests Endurance Deficit: Yes Endurance Deficit Description: requires rest breaks with mobility tasks OT Assessment Rehab Potential (ACUTE ONLY): Good OT Barriers to Discharge: Lack of/limited family support OT Patient demonstrates impairments in the following area(s): Balance;Cognition;Endurance;Motor;Safety;Vision OT Basic ADL's Functional Problem(s): Bathing;Dressing;Toileting OT Transfers Functional Problem(s): Toilet;Tub/Shower OT Plan OT Intensity: Minimum of 1-2 x/day, 45 to 90 minutes OT Frequency: 5 out of 7 days OT Duration/Estimated Length of Stay: 10-12 days OT Treatment/Interventions: Balance/vestibular training;Cognitive remediation/compensation;Community reintegration;Discharge planning;Disease mangement/prevention;DME/adaptive equipment instruction;Functional mobility training;Neuromuscular re-education;Pain management;Patient/family education;Psychosocial support;Self Care/advanced ADL retraining;Skin care/wound managment;Therapeutic Activities;Therapeutic Exercise;UE/LE  Strength taining/ROM;UE/LE Coordination activities;Visual/perceptual remediation/compensation OT Basic Self-Care Anticipated Outcome(s): Supervision OT Toileting Anticipated Outcome(s): Supervision OT Bathroom Transfers Anticipated Outcome(s): Supervision OT Recommendation Recommendations for Other Services: None Patient destination: Home Follow Up Recommendations: Outpatient OT Equipment Recommended: To be determined   OT Evaluation Precautions/Restrictions  Precautions Precautions: Fall Recall of Precautions/Restrictions: Impaired Precaution/Restrictions Comments:  Aphasia Restrictions Weight Bearing Restrictions Per Provider Order: No General Chart Reviewed: Yes Family/Caregiver Present: No Vital Signs Therapy Vitals Pulse Rate: (!) 110 (Post-activity) BP: 123/66 Patient Position (if appropriate): Sitting Oxygen Therapy SpO2: 100 % O2 Device: Room Air Pain Pain Assessment Pain Scale: Faces Faces Pain Scale: Hurts a little bit Pain Type: Acute pain Pain Location: Arm Pain Orientation: Right Pain Intervention(s): Rest Home Living/Prior Functioning Home Living Family/patient expects to be discharged to:: Private residence Living Arrangements: Children Available Help at Discharge: Family Type of Home: House Home Access: Stairs to enter Secretary/administrator of Steps: 5 STE Home Layout: Able to live on main level with bedroom/bathroom Bathroom Shower/Tub: Health visitor: Standard Bathroom Accessibility: Yes Additional Comments: Pt's daughter works Museum/gallery exhibitions officer; picks up weekday hours occasionally. Pt SIL is home Saturday and Sunday. Pt sister who lives in [place] is willing to come and help out. Information gathered from chart review due to patient's communciation deficits.  Lives With: Daughter Prior Function Level of Independence: Independent with basic ADLs, Independent with homemaking with ambulation, Independent with gait, Independent with  transfers Vision Baseline Vision/History: 1 Wears glasses Patient Visual Report: Other (comment) (Unable to formally assess due to language impairements.) Vision Assessment?: Vision impaired- to be further tested in functional context Perception  Perception: Impaired Praxis Praxis: Impaired Praxis Impairment Details: Motor planning Cognition Cognition Overall Cognitive Status: Impaired/Different from baseline Arousal/Alertness: Awake/alert Awareness: Impaired Executive Function: Organizing Brief Interview for Mental Status (BIMS) Repetition of Three Words (First Attempt): 1 Temporal Orientation: Year: Nonsensical Temporal Orientation: Month: Nonsensical Temporal Orientation: Day: Nonsensical Recall: Sock: No answer Recall: Blue: No answer Recall: Bed: No answer BIMS Summary Score: 99 Sensation Sensation Light Touch: Appears Intact Coordination Gross Motor Movements are Fluid and Coordinated: No Fine Motor Movements are Fluid and Coordinated: Yes Coordination and Movement Description: Deficits due to generalized debility and decreased activity tolerance. Motor  Motor Motor: Motor apraxia;Ataxia Motor - Skilled Clinical Observations: Decreased coordination, possible apraxia however difficult to differentiate between apraxia and aphasia, and R inattention.  Trunk/Postural Assessment  Cervical Assessment Cervical Assessment: Exceptions to [facility] (forward head, tendency toward L cervical rotation secondary to R inattention) Thoracic Assessment Thoracic Assessment: Exceptions to [facility] (rounded shoulders) Lumbar Assessment Lumbar Assessment: Within Functional Limits Postural Control Postural Control: Deficits on evaluation Righting Reactions: delayed and inadequate Protective Responses: decreased Postural Limitations: decreased  Balance Balance Balance Assessed: Yes Static Sitting Balance Static Sitting - Balance Support: Feet supported Static Sitting - Level of  Assistance: 5: Stand by assistance (Supervision) Dynamic Sitting Balance Dynamic Sitting - Balance Support: Feet supported;During functional activity Dynamic Sitting - Level of Assistance: 5: Stand by assistance (Supervision) Static Standing Balance Static Standing - Balance Support: During functional activity;No upper extremity supported Static Standing - Level of Assistance: 4: Min assist Dynamic Standing Balance Dynamic Standing - Balance Support: During functional activity;No upper extremity supported Dynamic Standing - Level of Assistance: 4: Min assist Extremity/Trunk Assessment RUE Assessment RUE Assessment: Within Functional Limits LUE Assessment LUE Assessment: Within Functional Limits  Care Tool Care Tool Self Care Eating   Eating Assist Level: Set up assist    Oral Care    Oral Care Assist Level: Set up assist    Bathing   Body parts bathed by patient: Right arm;Left arm;Chest;Abdomen;Front perineal area;Buttocks;Right upper leg;Left upper leg;Right lower leg;Left lower leg;Face     Assist Level: Contact Guard/Touching assist    Upper Body Dressing(including orthotics)   What is the patient wearing?: Pull over shirt  Assist Level: Set up assist    Lower Body Dressing (excluding footwear)   What is the patient wearing?: Underwear/pull up;Pants Assist for lower body dressing: Contact Guard/Touching assist    Putting on/Taking off footwear   What is the patient wearing?: Non-skid slipper socks Assist for footwear: Set up assist       Care Tool Toileting Toileting activity   Assist for toileting: Contact Guard/Touching assist     Care Tool Bed Mobility Roll left and right activity        Sit to lying activity        Lying to sitting on side of bed activity         Care Tool Transfers Sit to stand transfer        Chair/bed transfer         Toilet transfer   Assist Level: Minimal Assistance - Patient > 75%     Care Tool  Cognition  Expression of Ideas and Wants Expression of Ideas and Wants: 1. Rarely/Never expressess or very difficult - rarely/never expresses self or speech is very difficult to understand  Understanding Verbal and Non-Verbal Content Understanding Verbal and Non-Verbal Content: 3. Usually understands - understands most conversations, but misses some part/intent of message. Requires cues at times to understand   Memory/Recall Ability Memory/Recall Ability : None of the above were recalled   Refer to Care Plan for Long Term Goals  SHORT TERM GOAL WEEK 1 OT Short Term Goal 1 (Week 1): STGs=LTGs due to patient's estimated length of stay.  Recommendations for other services: None  Skilled Therapeutic Intervention  Session began with introduction to OT role, OT POC, and general orientation to rehab unit/schedule. Pt completes full-body sponge-bathing and dressing with levels of assistance noted below. Pt completes unstandarized 9-hole peg test with increased time.   ADL ADL Eating: Set up Where Assessed-Eating: Wheelchair Grooming: Setup;Supervision/safety Where Assessed-Grooming: Sitting at sink Upper Body Bathing: Supervision/safety;Setup Where Assessed-Upper Body Bathing: Sitting at sink Lower Body Bathing: Contact guard Where Assessed-Lower Body Bathing: Sitting at sink;Standing at sink Upper Body Dressing: Supervision/safety;Setup Where Assessed-Upper Body Dressing: Sitting at sink Lower Body Dressing: Contact guard;Minimal assistance Where Assessed-Lower Body Dressing: Standing at sink Toileting: Contact guard Where Assessed-Toileting: Teacher, adult education: Scientific laboratory technician Method: Proofreader: Engineer, technical sales: Not assessed Film/video editor: Not assessed Mobility  Transfers Sit to Stand: Contact Guard/Touching assist;Minimal Assistance - Patient > 75% Stand to Sit: Contact Guard/Touching  assist;Minimal Assistance - Patient > 75%   Discharge Criteria: Patient will be discharged from OT if patient refuses treatment 3 consecutive times without medical reason, if treatment goals not met, if there is a change in medical status, if patient makes no progress towards goals or if patient is discharged from hospital.  The above assessment, treatment plan, treatment alternatives and goals were discussed and mutually agreed upon: by patient  Nereida Habermann, OTR/L, MSOT  08/01/2024, 10:56 AM

## 2024-08-01 NOTE — Progress Notes (Signed)
 Inpatient Rehabilitation  Patient information reviewed and entered into eRehab system by [facility]. Karen Kays., CCC/SLP, PPS Coordinator.  Information including medical coding, functional ability and quality indicators will be reviewed and updated through discharge.

## 2024-08-01 NOTE — Progress Notes (Signed)
 Met with patient to review current situation, team conference and plan of care. Patient aphasic, disoriented to situation, time. Will come back when family is here. Continue to follow along to provide educational needs to facilitate preparation for discharge.

## 2024-08-01 NOTE — Progress Notes (Addendum)
 PROGRESS NOTE   Subjective/Complaints:  No events overnight.  Complains of feeling generally bad this a.m.  On review of systems, complains of pain throughout her entire chest and abdomen, tender to palpation.  Denies any shortness of breath, chest pressure, nausea, or other associated symptoms.  Vitals stable     08/01/2024    4:30 AM 07/31/2024    7:55 PM 07/31/2024    6:51 PM  Vitals with BMI  Systolic 136 120 880  Diastolic 71 68 63  Pulse 91 92 95     P.o. intakes appropriate , 40-50% Continent of bladder  Last BM 9/28- continent of B/B  WBC downtrending, HgB 7.3, hold eliquis   ROS: Denies fevers, chills, N/V, abdominal pain, constipation, diarrhea, SOB, cough, chest pain, new weakness or paraesthesias.   + Diffuse pain  Objective:   No results found. Recent Labs    07/30/24 0454 08/01/24 0527  WBC 16.3* 14.6*  HGB 8.7* 7.3*  HCT 27.6* 23.8*  PLT 390 385   Recent Labs    07/30/24 0454 08/01/24 0527  NA 138 139  K 3.7 3.7  CL 106 105  CO2 19* 21*  GLUCOSE 102* 91  BUN 23 18  CREATININE 0.90 0.85  CALCIUM  8.6* 8.6*    Intake/Output Summary (Last 24 hours) at 08/01/2024 0926 Last data filed at 08/01/2024 0800 Gross per 24 hour  Intake 180 ml  Output --  Net 180 ml        Physical Exam: Vital Signs Blood pressure 136/71, pulse 91, temperature 98.9 F (37.2 C), temperature source Oral, resp. rate 18, height 5' 2 (1.575 m), weight 55.4 kg, SpO2 99%. Constitutional: No apparent distress. Appropriate appearance for age.  HENT: No JVD. Neck Supple. Trachea midline. Atraumatic, normocephalic. Eyes: PERRLA. EOMI. Visual fields grossly intact.  Cardiovascular: RRR, systolic murmur . No Edema. Peripheral pulses 2+.  Diffusely tender on palpation of the right anterior chest Respiratory: CTAB. No rales, rhonchi, or wheezing. On RA.  Abdomen: + bowel sounds, normoactive. No distention; diffusely tender  on palpation Skin: C/D/I. No apparent lesions. MSK:      No apparent deformity.      Strength:                RUE: 5/5 SA, 5/5 EF, 5/5 EE, 5/5 WE, 5/5 FF, 5/5 FA                 LUE: 5/5 SA, 5/5 EF, 5/5 EE, 5/5 WE, 5/5 FF, 5/5 FA                 RLE: 5/5 HF, 5/5 KE, 5/5 DF, 5/5 EHL, 5/5 PF                 LLE:  5/5 HF, 5/5 KE, 5/5 DF, 5/5 EHL, 5/5 PF   Neurologic exam:  Cognition: AAO to person, place, time  Language: Fluent, with some difficulty word finding, both expressive and receptive aphasia. Memory: Significant deficits Insight: Poor insight into current condition.  Mood: Subdued, flat Sensation: Equal and intact in BL UE and Les.  Reflexes: 2+ in BL UE and LEs. Negative Hoffman's and babinski signs bilaterally.  CN: 2-12 grossly intact.  Coordination: No apparent tremors. No ataxia on FTN, HTS bilaterally.  Spasticity: MAS 0 in all extremities.         Assessment/Plan: 1. Functional deficits which require 3+ hours per day of interdisciplinary therapy in a comprehensive inpatient rehab setting. Physiatrist is providing close team supervision and 24 hour management of active medical problems listed below. Physiatrist and rehab team continue to assess barriers to discharge/monitor patient progress toward functional and medical goals  Care Tool:  Bathing              Bathing assist       Upper Body Dressing/Undressing Upper body dressing        Upper body assist      Lower Body Dressing/Undressing Lower body dressing            Lower body assist       Toileting Toileting    Toileting assist       Transfers Chair/bed transfer  Transfers assist           Locomotion Ambulation   Ambulation assist              Walk 10 feet activity   Assist           Walk 50 feet activity   Assist           Walk 150 feet activity   Assist           Walk 10 feet on uneven surface  activity   Assist            Wheelchair     Assist               Wheelchair 50 feet with 2 turns activity    Assist            Wheelchair 150 feet activity     Assist          Blood pressure 136/71, pulse 91, temperature 98.9 F (37.2 C), temperature source Oral, resp. rate 18, height 5' 2 (1.575 m), weight 55.4 kg, SpO2 99%.  Medical Problem List and Plan: 1. Functional deficits secondary to embolic left temporal-occipital infarct             -patient may  shower             -ELOS/Goals: 7-9 days, supervision goals with PT, OT and sup/min with SLP--tentative discharge 10-10  - Stable to continue inpatient rehab  2.  DVT/Antithrombotics: History of right popliteal DVT 9-9, started on Eliquis , repeat BLE dopplers done revealing acute DVT right popliteal, peroneal and posterior tib veins 09/23  -DVT/anticoagulation:  Pharmaceutical: Eliquis              -antiplatelet therapy: N/A   - 9-30: Given recent embolic stroke, continue Eliquis  for now.  May need to reduce dose to 2.5 mg if hemoglobin continues to downtrend.  Will get CTA to evaluate for PE given ongoing tachycardia, leukocytosis, and low-grade fevers with generalized chest/abdominal pain and history of DVT 9-9 and 9-23--would need this evaluated prior to adjustments of anticoagulation  3. Pain Management: tylenol  prn.   - Would not schedule Tylenol  due to potential to mask fevers.  4. Mood/Behavior/Sleep: LCSW to follow for evaluation and support.             --melatonin prn insomnia.              -antipsychotic agents: N\/A   - 9-30: Withdrawn, poor  attention.  Start fluoxetine 20 mg daily for motor recovery and behavior.  Start sleep log.  5. Neuropsych/cognition: This patient is not capable of making decisions on her own behalf.  6. Skin/Wound Care: Routine pressure relief measures.  7. Fluids/Electrolytes/Nutrition: Monitor I/O. Check CMET in am.    -9-30: A.m. labs stable  8.   Acute on chronic anemia: Monitor for  signs of bleeding. H/H stable 6.1-->8.7. --add iron supplement as Iron level <10.  -  9-30: Hemoglobin downtrending, 7.3.  No obvious source of bleeding.  Added FOBT today.  Repeat H&H in AM; may need to transfuse if any lower.  Continue Eliquis  at current dose but if signs of bleeding may need to discuss adjusting to 2.5 mg daily.  9.  Resting tachycardia: HR ranging in 110-120 at rest. CTA to rule out PE?             -pt is not in distress, breathing comfortably, no chest pain, on eliquis              --Afebrile X 24 hours.   --Monitor for symptoms with increase in activity.  9-30: Remained in the 90s overnight and throughout therapies this a.m.; coming up in the 110s this afternoon  10. Leukocytosis: WBC on upward trend--16.0-->14.5-->16.3              -however, pt is afebrile, urine/chest clear, on full a/c --Sepsis work up prn temp elevation 9-30: WBCs downtrending 14.6--have been stable at approximately this level since 9-24.  No fevers, chills, obvious lines of infection.  Is complaining of some chest pain and temperature 100 this afternoon; CTA ordered as above   11.  Elevated ALP, other LFTs are normal.  Trend.  LOS: 1 days A FACE TO FACE EVALUATION WAS PERFORMED  Tiffany [name] 08/01/2024, 9:26 AM

## 2024-08-01 NOTE — Patient Care Conference (Signed)
 Inpatient RehabilitationTeam Conference and Plan of Care Update Date: 08/01/2024   Time: 1044 am    Patient Name: Tiffany [name]      Medical Record Number: [id number]  Date of Birth: 1941-07-31 Sex: Female         Room/Bed: 4W07C/4W07C-01 Payor Info: Payor: [employer] MEDICARE / Plan: [employer] MEDICARE CHOICE PPO / Product Type: *No Product type* /    Admit Date/Time:  07/31/2024  3:32 PM  Primary Diagnosis:  Acute ischemic left middle cerebral artery (MCA) stroke [facility])  Hospital Problems: Principal Problem:   Acute ischemic left middle cerebral artery (MCA) stroke [facility])    Expected Discharge Date: Expected Discharge Date: 08/11/24  Team Members Present: Physician leading conference: Dr. Joesph Likes Social Worker Present: Graeme Jude, LCSW Nurse Present: Eulalio Falls, RN PT Present: Elam Ohara, PT OT Present: Nereida Habermann, OT SLP Present: Recardo Mole, SLP PPS Coordinator present : Eleanor Colon, SLP     Current Status/Progress Goal Weekly Team Focus  Bowel/Bladder   Pt continent of bowel/bladder.   Remain continent of bowel/bladder during stay.   Toilet pt q 4 hours while awake and PRN.    Swallow/Nutrition/ Hydration   eval pending           ADL's   CGA-Min A overall for ADLs and ambulatory transfers. Barriers: Aphasia, decreased activity tolerance, decreased awareness into deficits.   Supervision   Functional transfers, general conditioning, activity tolerance    Mobility   eval pending           Communication   eval pending            Safety/Cognition/ Behavioral Observations  eval pending            Pain   No c/o pain reported at this time.   Pain score to remain less than or equal to 3 on pain scale.   Assess for pain q shift and PRN.    Skin   Skin intact.   Skin to remain free from sign/symptoms of infection and breakdown.  Assess skin q shift and PRN for breakdown.      Discharge Planning:  TBA    Team  Discussion: Patient was admitted post stroke embolic left temporal-occipital infarct. Patient with generalized pain chest and abdomen, tachycardia, history of DVT, hemoglobin and WBC's down trending , poor attention : medication / treatment adjusted by MD. Patient's progress limited by aphasia, decreased activity tolerance , decreased awareness into deficit, poor po intake and poor endurance.  Patient on target to meet rehab goals: Evals pending  *See Care Plan and progress notes for long and short-term goals.   Revisions to Treatment Plan:  CTA to evaluate PE  Sleep chart Possible blood transfusion  Teaching Needs: Safety, medications, transfers, toileting, encourage po intake, etc   Current Barriers to Discharge: Decreased caregiver support and Home enviroment access/layout  Possible Resolutions to Barriers: Family Education     Medical Summary Current Status: medically complicated by embolic streoke, aphasia, anemia, leukocytosis, tachycardia, malnutiriton, and constipation  Barriers to Discharge: Behavior/Mood;Inadequate Nutritional Intake;Medical stability;Self-care education;Symptomatic Anemia   Possible Resolutions to [employer] Focus: workup for worsening anemia, monitorring labs for leukocytosis and s/s infection, encourage PO intakes, titratew bowel medications   Continued Need for Acute Rehabilitation Level of Care: The patient requires daily medical management by a physician with specialized training in physical medicine and rehabilitation for the following reasons: Direction of a multidisciplinary physical rehabilitation program to maximize functional independence : Yes Medical management of patient  stability for increased activity during participation in an intensive rehabilitation regime.: Yes Analysis of laboratory values and/or radiology reports with any subsequent need for medication adjustment and/or medical intervention. : Yes   I attest that I was  present, lead the team conference, and concur with the assessment and plan of the team.   Wilmore Holsomback Gayo 08/01/2024, 1044 am

## 2024-08-01 NOTE — Evaluation (Signed)
 Speech Language Pathology Assessment and Plan  Patient Details  Name: Tiffany [name] MRN: [id number] Date of Birth: 1941-03-01  SLP Diagnosis: Aphasia;Speech and Language deficits  Rehab Potential: Good ELOS: 10/10    Today's Date: 08/01/2024 SLP Individual Time: 1300-1400 SLP Individual Time Calculation (min): 60 min   Hospital Problem: Principal Problem:   Acute ischemic left middle cerebral artery (MCA) stroke (HCC)  Past Medical History:  Past Medical History:  Diagnosis Date   Hypertension    Past Surgical History: History reviewed. No pertinent surgical history.  Assessment / Plan / Recommendation Clinical Impression Pt is an 83 year old female with history of HTN, syncope, pre-diabetes, anemia, glaucoma, recent diagnosis of DVT- on eliquis ; who was admitted on 07/23/24 with gabled speech and confusion. 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 CTA negative for LVO and showed posterior L-MCA M3 or M4 brach occlusion. MRI brain revealed large acute L-MCA territory infarct without changes, patchy sclerosis and thickening squamous portion of left temporal bone and additional tiny acute infarcts in right perirolantic and frontotemporal cortex, right parietal and bilateral cerebellar hemispheres. 2 D echo showed EF 60-65% with no wall abnormality and mild AVR without stenosis. She was transfused with 2 units PRBC and anemia felt to be chronic and to monitor H/H w/ outpatient colonoscopy recommended per discussion with Dr. Stacia. 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 Eliquis  resumed on 09/22 with recommendations of 30 day event monitor after d/c to rule out A fib   Cognitive-linguistic: Pt presents w/ severe aphasia (receptive > expressive). Pt was 50% accurate w/ Y/N questions and able to follow only very functional commands x2 hand me ___. She was unable to  repeat any words during structured tasks, but was able to repeat one word during confrontational naming subtest. DEP for naming. She demonstrated no awareness into verbal deficits and often responded w/ automatic response, oh girl throughout eval tasks. She did demonstrate increased error awareness during written subtest copying her name/single words. She was able to copy 3 letter words @ minA and her name @ modA level. Severe deficits also noted during reading comprehension task at word level. She would benefit from skilled ST services to target severe fluent aphasia, maximize pt communication, and reduce caregiver burden.   Bedside Swallow: PO trials completed w/ regular textures and thin liquids via straw. No overt s/s of airway invasion were evident across trials. Additionally, adequate oral clearance was noted. She demonstrates adequate oropharyngeal swallow function to continue tolerating regular textures/thin liquids at this time. Meds whole w/ liquid. Dysphagia intervention not warranted.     Skilled Therapeutic Interventions          SLP facilitated a cognitive-linguistic evaluation and brief bedside swallow eval to assess pt's cognitive-communication skills and determine need for additional skilled ST services. See above for more information.    SLP Assessment  Patient will need skilled Speech Lanaguage Pathology Services during CIR admission    Recommendations  SLP Diet Recommendations: Age appropriate regular solids;Thin Liquid Administration via: Straw Medication Administration: Whole meds with liquid Supervision: Patient able to self feed Compensations: Minimize environmental distractions;Slow rate;Small sips/bites Postural Changes and/or Swallow Maneuvers: Upright 30-60 min after meal;Seated upright 90 degrees Oral Care Recommendations: Oral care BID Recommendations for Other Services: Neuropsych consult;Therapeutic Recreation consult Therapeutic Recreation Interventions: Pet  therapy Patient destination: Home Follow up Recommendations: 24 hour supervision/assistance Equipment Recommended: None recommended  by SLP    SLP Frequency 3 to 5 out of 7 days   SLP Duration  SLP Intensity  SLP Treatment/Interventions 10/10  Minumum of 1-2 x/day, 30 to 90 minutes  Functional tasks;Cueing hierarchy;Internal/external aids;Patient/family education;Speech/Language facilitation;Therapeutic Activities;Multimodal communication approach;Environmental controls    Pain  None reported  Prior Functioning Cognitive/Linguistic Baseline: Information not available Type of Home: House  Lives With: Daughter Available Help at Discharge: Family Vocation: Retired  SLP Evaluation Cognition Overall Cognitive Status: Difficult to assess Arousal/Alertness: Awake/alert Orientation Level: Oriented to person;Oriented to place;Disoriented to time Safety/Judgment: Impaired Comments: does not appear to present w/ awareness of deficits. Questionable safety awareness.  Comprehension Auditory Comprehension Overall Auditory Comprehension: Impaired Yes/No Questions: Impaired Basic Biographical Questions: 26-50% accurate Basic Immediate Environment Questions: 25-49% accurate Commands: Impaired One Step Basic Commands: 0-24% accurate Conversation: Simple Other Conversation Comments: severe defiicts noted w/ functional conversation as well Interfering Components: Hearing EffectiveTechniques: Repetition;Visual/Gestural cues;Stressing words Visual Recognition/Discrimination Common Objects: Unable to indentify Reading Comprehension Reading Status: Impaired Word level: Impaired Expression Expression Primary Mode of Expression: Verbal Verbal Expression Overall Verbal Expression: Impaired Initiation: No impairment Automatic Speech: Social Response Level of Generative/Spontaneous Verbalization: Phrase;Sentence Repetition: Impaired Level of Impairment: Word level Naming:  Impairment Responsive: 0-25% accurate Confrontation: Impaired Common Objects: Unable to indentify Divergent: 0-24% accurate Other Naming Comments: profound naming deifcits Verbal Errors: Not aware of errors Pragmatics: No impairment Other Verbal Expression Comments: profound expressive deficits Written Expression Dominant Hand: Right Written Expression: Exceptions to [facility] Copy Ability: Word Oral Motor Oral Motor/Sensory Function Overall Oral Motor/Sensory Function: Within functional limits Motor Speech Overall Motor Speech: Appears within functional limits for tasks assessed  Care Tool Care Tool Cognition Ability to hear (with hearing aid or hearing appliances if normally used Ability to hear (with hearing aid or hearing appliances if normally used): 1. Minimal difficulty - difficulty in some environments (e.g. when person speaks softly or setting is noisy)   Expression of Ideas and Wants Expression of Ideas and Wants: 1. Rarely/Never expressess or very difficult - rarely/never expresses self or speech is very difficult to understand   Understanding Verbal and Non-Verbal Content Understanding Verbal and Non-Verbal Content: 2. Sometimes understands - understands only basic conversations or simple, direct phrases. Frequently requires cues to understand  Memory/Recall Ability Memory/Recall Ability : None of the above were recalled   Motor Speech Assessment  Intelligible  Bedside Swallowing Assessment Thin Liquid Thin Liquid: Within functional limits Presentation: Straw Solid Solid: Within functional limits Presentation: Self Fed   Short Term Goals: Week 1: SLP Short Term Goal 1 (Week 1): STGs = LTGs d/t ELOS  Refer to Care Plan for Long Term Goals  Recommendations for other services: Neuropsych and Therapeutic Recreation  Pet therapy  Discharge Criteria: Patient will be discharged from SLP if patient refuses treatment 3 consecutive times without medical reason, if treatment  goals not met, if there is a change in medical status, if patient makes no progress towards goals or if patient is discharged from hospital.  The above assessment, treatment plan, treatment alternatives and goals were discussed and mutually agreed upon: No family available/patient unable  [name] 08/01/2024, 2:32 PM

## 2024-08-01 NOTE — Progress Notes (Signed)
 Received a call from Radiologist regarding CT Chest Results: @20 :50 IMPRESSION: 1. Small volume right upper lobe and right lower lobe segmental and subsegmental pulmonary emboli. 2. Incompletely visualized vague hypodensity in the central liver, raising concern for possible liver lesion/mass. In addition, there is incompletely visualized nodularity within the left upper quadrant as could be seen with peritoneal metastatic disease. Recommend dedicated abdominopelvic contrast-enhanced CT. 3. Critical Value/emergent results were called by telephone at the time of interpretation on 08/01/2024 at 8:52 pm to provider NP Debby , who verbally acknowledged these results.   Aortic Atherosclerosis (ICD10-I70.0). Tiffany [name] was currently on Eliquis , results was discussed with Dr Emeline via secure chat, Eliquis  will be discontinued and Pharmacy will convert her to heparin gtt. This provider spoke with pharmacist, regarding the above.   CT abdomen and pelvis was ordered by PA. Alray RN is aware of the above,  Vitals 99.2 Apical pulse: 92, BP 119/66 O2 sat's 99% We will continue to monitor.

## 2024-08-01 NOTE — Plan of Care (Signed)
  Problem: RH Comprehension Communication Goal: LTG Patient will comprehend basic/complex auditory (SLP) Description: LTG: Patient will comprehend basic/complex auditory information with cues (SLP). Flowsheets (Taken 08/01/2024 1641) LTG: Patient will comprehend: Basic auditory information LTG: Patient will comprehend auditory information with cueing (SLP): Moderate Assistance - Patient 50 - 74%   Problem: RH Expression Communication Goal: LTG Patient will express needs/wants via multi-modal(SLP) Description: LTG:  Patient will express needs/wants via multi-modal communication (gestures/written, etc) with cues (SLP) Flowsheets (Taken 08/01/2024 1641) LTG: Patient will express needs/wants via multimodal communication (gestures/written, etc) with cueing (SLP): Moderate Assistance - Patient 50 - 74% Goal: LTG Patient will increase word finding of common (SLP) Description: LTG:  Patient will increase word finding of common objects/daily info/abstract thoughts with cues using compensatory strategies (SLP). Flowsheets (Taken 08/01/2024 1641) LTG: Patient will increase word finding of common (SLP): Moderate Assistance - Patient 50 - 74% Patient will use compensatory strategies to increase word finding of: Common objects   Problem: RH Pre-functional/Other (Specify) Goal: RH LTG SLP (Specify) 1 Description: RH LTG SLP (Specify) 1 Flowsheets (Taken 08/01/2024 1641) LTG: Other SLP (Specify) 1: Pt will copy familiar words w/ modA

## 2024-08-01 NOTE — Plan of Care (Signed)
  Problem: Consults Goal: RH STROKE PATIENT EDUCATION Description: See Patient Education module for education specifics  Outcome: Progressing   Problem: RH SAFETY Goal: RH STG ADHERE TO SAFETY PRECAUTIONS W/ASSISTANCE/DEVICE Description: STG Adhere to Safety Precautions With cues Assistance/Device. Outcome: Progressing   Problem: RH KNOWLEDGE DEFICIT Goal: RH STG INCREASE KNOWLEDGE OF STROKE PROPHYLAXIS Description: Patient and family will be able to manage care/secondary risks at discharge using educational resources for medications and dietary modification independently Outcome: Progressing   Problem: RH KNOWLEDGE DEFICIT Goal: RH STG INCREASE KNOWLEDGE OF HYPERTENSION Description: Patient and family will be able to manage HTN at discharge using educational resources for medications and dietary modification independently Outcome: Progressing Goal: RH STG INCREASE KNOWLEGDE OF HYPERLIPIDEMIA Description: Patient and family will be able to manage HLD at discharge using educational resources for medications and dietary modification independently Outcome: Progressing

## 2024-08-01 NOTE — Plan of Care (Signed)
  Problem: RH Balance Goal: LTG Patient will maintain dynamic standing with ADLs (OT) Description: LTG:  Patient will maintain dynamic standing balance with assist during activities of daily living (OT)  Flowsheets (Taken 08/01/2024 1557) LTG: Pt will maintain dynamic standing balance during ADLs with: Supervision/Verbal cueing   Problem: RH Bathing Goal: LTG Patient will bathe all body parts with assist levels (OT) Description: LTG: Patient will bathe all body parts with assist levels (OT) Flowsheets (Taken 08/01/2024 1557) LTG: Pt will perform bathing with assistance level/cueing: Supervision/Verbal cueing   Problem: RH Dressing Goal: LTG Patient will perform lower body dressing w/assist (OT) Description: LTG: Patient will perform lower body dressing with assist, with/without cues in positioning using equipment (OT) Flowsheets (Taken 08/01/2024 1557) LTG: Pt will perform lower body dressing with assistance level of: Supervision/Verbal cueing   Problem: RH Toileting Goal: LTG Patient will perform toileting task (3/3 steps) with assistance level (OT) Description: LTG: Patient will perform toileting task (3/3 steps) with assistance level (OT)  Flowsheets (Taken 08/01/2024 1557) LTG: Pt will perform toileting task (3/3 steps) with assistance level: Supervision/Verbal cueing   Problem: RH Toilet Transfers Goal: LTG Patient will perform toilet transfers w/assist (OT) Description: LTG: Patient will perform toilet transfers with assist, with/without cues using equipment (OT) Flowsheets (Taken 08/01/2024 1557) LTG: Pt will perform toilet transfers with assistance level of: Supervision/Verbal cueing   Problem: RH Tub/Shower Transfers Goal: LTG Patient will perform tub/shower transfers w/assist (OT) Description: LTG: Patient will perform tub/shower transfers with assist, with/without cues using equipment (OT) Flowsheets (Taken 08/01/2024 1557) LTG: Pt will perform tub/shower stall transfers with  assistance level of: Supervision/Verbal cueing

## 2024-08-02 ENCOUNTER — Encounter (HOSPITAL_COMMUNITY): Payer: Self-pay | Admitting: Physical Medicine and Rehabilitation

## 2024-08-02 DIAGNOSIS — D376 Neoplasm of uncertain behavior of liver, gallbladder and bile ducts: Secondary | ICD-10-CM

## 2024-08-02 DIAGNOSIS — D649 Anemia, unspecified: Secondary | ICD-10-CM | POA: Diagnosis not present

## 2024-08-02 DIAGNOSIS — C786 Secondary malignant neoplasm of retroperitoneum and peritoneum: Secondary | ICD-10-CM

## 2024-08-02 DIAGNOSIS — F09 Unspecified mental disorder due to known physiological condition: Secondary | ICD-10-CM

## 2024-08-02 DIAGNOSIS — R188 Other ascites: Secondary | ICD-10-CM

## 2024-08-02 DIAGNOSIS — K3189 Other diseases of stomach and duodenum: Secondary | ICD-10-CM | POA: Diagnosis not present

## 2024-08-02 DIAGNOSIS — R933 Abnormal findings on diagnostic imaging of other parts of digestive tract: Secondary | ICD-10-CM | POA: Diagnosis not present

## 2024-08-02 DIAGNOSIS — I63512 Cerebral infarction due to unspecified occlusion or stenosis of left middle cerebral artery: Secondary | ICD-10-CM | POA: Diagnosis not present

## 2024-08-02 DIAGNOSIS — I2699 Other pulmonary embolism without acute cor pulmonale: Secondary | ICD-10-CM | POA: Diagnosis not present

## 2024-08-02 DIAGNOSIS — I82401 Acute embolism and thrombosis of unspecified deep veins of right lower extremity: Secondary | ICD-10-CM

## 2024-08-02 DIAGNOSIS — D75839 Thrombocytosis, unspecified: Secondary | ICD-10-CM

## 2024-08-02 LAB — APTT
aPTT: 36 s (ref 24–36)
aPTT: 40 s — ABNORMAL HIGH (ref 24–36)

## 2024-08-02 LAB — CBC
HCT: 22.8 % — ABNORMAL LOW (ref 36.0–46.0)
Hemoglobin: 7 g/dL — ABNORMAL LOW (ref 12.0–15.0)
MCH: 24.1 pg — ABNORMAL LOW (ref 26.0–34.0)
MCHC: 30.7 g/dL (ref 30.0–36.0)
MCV: 78.6 fL — ABNORMAL LOW (ref 80.0–100.0)
Platelets: 473 K/uL — ABNORMAL HIGH (ref 150–400)
RBC: 2.9 MIL/uL — ABNORMAL LOW (ref 3.87–5.11)
RDW: 18 % — ABNORMAL HIGH (ref 11.5–15.5)
WBC: 14.1 K/uL — ABNORMAL HIGH (ref 4.0–10.5)
nRBC: 0 % (ref 0.0–0.2)

## 2024-08-02 LAB — HEMOGLOBIN AND HEMATOCRIT, BLOOD
HCT: 22.5 % — ABNORMAL LOW (ref 36.0–46.0)
Hemoglobin: 7.1 g/dL — ABNORMAL LOW (ref 12.0–15.0)

## 2024-08-02 LAB — PREPARE RBC (CROSSMATCH)

## 2024-08-02 LAB — HEPARIN LEVEL (UNFRACTIONATED): Heparin Unfractionated: >1.1 [IU]/mL — ABNORMAL HIGH (ref 0.30–0.70)

## 2024-08-02 LAB — ABO/RH: ABO/RH(D): O POS

## 2024-08-02 MED ORDER — ACETAMINOPHEN 500 MG PO TABS
1000.0000 mg | ORAL_TABLET | Freq: Three times a day (TID) | ORAL | Status: DC
  Administered 2024-08-02 – 2024-08-11 (×26): 1000 mg via ORAL
  Filled 2024-08-02 (×25): qty 2

## 2024-08-02 MED ORDER — SODIUM CHLORIDE 0.9% IV SOLUTION
Freq: Once | INTRAVENOUS | Status: AC
Start: 2024-08-02 — End: 2024-08-02

## 2024-08-02 MED ORDER — SODIUM CHLORIDE 0.9 % IV SOLN: INTRAVENOUS | Status: AC

## 2024-08-02 NOTE — Plan of Care (Signed)
  Problem: Consults Goal: RH STROKE PATIENT EDUCATION Description: See Patient Education module for education specifics  Outcome: Progressing   Problem: RH SAFETY Goal: RH STG ADHERE TO SAFETY PRECAUTIONS W/ASSISTANCE/DEVICE Description: STG Adhere to Safety Precautions With cues Assistance/Device. Outcome: Progressing   Problem: RH KNOWLEDGE DEFICIT Goal: RH STG INCREASE KNOWLEDGE OF STROKE PROPHYLAXIS Description: Patient and family will be able to manage care/secondary risks at discharge using educational resources for medications and dietary modification independently Outcome: Progressing   Problem: RH KNOWLEDGE DEFICIT Goal: RH STG INCREASE KNOWLEDGE OF HYPERTENSION Description: Patient and family will be able to manage HTN at discharge using educational resources for medications and dietary modification independently Outcome: Progressing Goal: RH STG INCREASE KNOWLEGDE OF HYPERLIPIDEMIA Description: Patient and family will be able to manage HLD at discharge using educational resources for medications and dietary modification independently Outcome: Progressing

## 2024-08-02 NOTE — Care Management (Signed)
 Inpatient Rehabilitation Center Individual Statement of Services  Patient Name:  Tiffany [name]  Date:  08/02/2024  Welcome to the Inpatient Rehabilitation Center.  Our goal is to provide you with an individualized program based on your diagnosis and situation, designed to meet your specific needs.  With this comprehensive rehabilitation program, you will be expected to participate in at least 3 hours of rehabilitation therapies Monday-Friday, with modified therapy programming on the weekends.  Your rehabilitation program will include the following services:  Physical Therapy (PT), Occupational Therapy (OT), Speech Therapy (ST), 24 hour per day rehabilitation nursing, Therapeutic Recreaction (TR), Psychology, Neuropsychology, Care Coordinator, Rehabilitation Medicine, Nutrition Services, Pharmacy Services, and Other  Weekly team conferences will be held on Tuesday to discuss your progress.  Your Inpatient Rehabilitation Care Coordinator will talk with you frequently to get your input and to update you on team discussions.  Team conferences with you and your family in attendance may also be held.  Expected length of stay: 10-12 days    Overall anticipated outcome: Supervision  Depending on your progress and recovery, your program may change. Your Inpatient Rehabilitation Care Coordinator will coordinate services and will keep you informed of any changes. Your Inpatient Rehabilitation Care Coordinator's name and contact numbers are listed  below.  The following services may also be recommended but are not provided by the Inpatient Rehabilitation Center:  Driving Evaluations Home Health Rehabiltiation Services Outpatient Rehabilitation Services Vocational Rehabilitation   Arrangements will be made to provide these services after discharge if needed.  Arrangements include referral to agencies that provide these services.  Your insurance has been verified to be:  [employer]  Your primary  doctor is:  Kennieth Leech  Pertinent information will be shared with your doctor and your insurance company.  Inpatient Rehabilitation Care Coordinator:  Graeme Jude, KEN [phone] or [phone]  Information discussed with and copy given to patient by: Graeme DELENA Jude, 08/02/2024, 11:54 AM

## 2024-08-02 NOTE — Progress Notes (Addendum)
 Discussed patient with Dr. Gatha. He recommends GI consult for work up/scope. 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 To keep patient on heparin for now and full consult to follow. GI consult for input.

## 2024-08-02 NOTE — Progress Notes (Signed)
 CT results relayed to Sharlet Schmitz, PA   Geni Armor, LPN

## 2024-08-02 NOTE — Progress Notes (Signed)
 Physical Therapy Session Note  Patient Details  Name: Tiffany [name] MRN: [id number] Date of Birth: Feb 23, 1941  Today's Date: 08/02/2024 PT Individual Time: 0804-0858 PT Individual Time Calculation (min): 54 min   Short Term Goals: Week 1:  PT Short Term Goal 1 (Week 1): STG = LTG due to ELOS  Skilled Therapeutic Interventions/Progress Updates:     Pt received supine in bed, agreeable to therapy w/ minimal encouragement. Pt w/ no c/o pain at this time. Pt required increased time to understand the nature of our visit and became fixated on her bed sheets. Pt performed bed mobility w/ SBA, given CGA for gait and to manage IV pole. Pt ambulated to day room and required multiple verbal and visual cues to locate mat table to sit. Able to perform stand sit transfer w/ SBA.   In sitting, pt performed LAQs and marching w/ BLE, requiring visual demonstration of exercise before performing and constant cueing to keep going.   Exercises performed to improve pt functional activity tolerance, gait mechanics, and decrease fall risk.   Pt seated in short sitting on edge of mat. Pt completes sit to stand with minA, with visual cues and demonstration of desired movement. Pt slowly comes to standing, with hands on knees and appearing to grimace, though does not verbalize pain. PT provides tactile cueing at hips and trunk to promote extension and upright posture. Pt able to come briefly into upright standing without AD, but tends to flex forward at hips and trunk, leaning on trunk of PT for support.   Pt stands and ambulates without BUE support on IV pole. Pt leans forward on pole with head down and periodic moaning, but unable to verbalize what she is feeling, other than fatigue. Pt able to ambulate x175' prior to sitting, but does take several standing rest breaks in which she leans on pole or desk at nursing station. PT provides cues to increase step height and stride length, as well as attempting to maintain  upright gaze and posture to improve balance and decrease risk for falls.   Therapy Documentation Precautions:  Precautions Precautions: Fall Recall of Precautions/Restrictions: Impaired Precaution/Restrictions Comments: Aphasia Restrictions Weight Bearing Restrictions Per Provider Order: No   Therapy/Group: Individual Therapy  Elsie JAYSON Dawn, PT, DPT 08/02/2024, 4:08 PM

## 2024-08-02 NOTE — Progress Notes (Signed)
 Daughter returned call for update. Discussed results of tests, consults ordered, concerns of bleeding/clotting risks, code status and briefly discussed palliative care and their role in situations like this.   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 Daughter would like to have cancer workup with input from specialists on options available.  She reported that  the patient has expressed that she did not want to be intubated but all other interventions desired--CPR, meds, BIPAP.

## 2024-08-02 NOTE — Progress Notes (Signed)
 AC discussed with Dr. Jerri. He feels patient at too high risk for bleed with ongoing anemia and downward trend in hgb. He agrees with IVC filter and agrees with Dr. Gatha regarding no anticoagulation. Will continue IV heparin and he recommends clearance from GI regarding safety w/ASA.

## 2024-08-02 NOTE — Progress Notes (Signed)
 PROGRESS NOTE   Subjective/Complaints:  Overnight, CTA showing segmental and  subsegmental PE, patient transition to heparin drip and tolerating well.  CTA also with some concerning signs for liver nodularity/mass, CT abdomen and pelvis performed overnight with multiple hepatic and peritoneal masses concerning for metastatic cancer.  Patient had Cologuard testing 02-2023 which was negative.  FOBT negative -9-21.  Patient seen in bed today; continues to complain of diffuse, generalized pain, especially in her head and abdomen. Limited d/t aphasia.  Vitals remained stable overnight with still some clinical temperatures, 99 to 100 F.  Heart rate appears stable Hemoglobin 7.1 this a.m.   Unable to obtain ROS due to patient cognitive status.   + Headache + Abdominal pain  Objective:   CT ABDOMEN PELVIS W CONTRAST Result Date: 08/02/2024 CLINICAL DATA:  The patient had a CTA chest earlier today for pulmonary embolism. A mass was noted in the central liver as well as evidence of peritoneal metastases in the left upper abdomen. EXAM: CT ABDOMEN AND PELVIS WITH CONTRAST TECHNIQUE: Multidetector CT imaging of the abdomen and pelvis was performed using the standard protocol following bolus administration of intravenous contrast. RADIATION DOSE REDUCTION: This exam was performed according to the departmental dose-optimization program which includes automated exposure control, adjustment of the mA and/or kV according to patient size and/or use of iterative reconstruction technique. CONTRAST:  60mL OMNIPAQUE  IOHEXOL  350 MG/ML SOLN COMPARISON:  Ultrasound complete abdomen 01/12/2019 was negative except for a 5 cm left renal cyst. The only other cross-sectional comparison is today's CTA chest. FINDINGS: Lower chest: There is mild cardiomegaly, minimal pericardial effusion. Scattered three-vessel coronary calcifications. Lung bases are clear apart from  posterior atelectatic change in the lower lobes. Hepatobiliary: There are multiple hepatic masses consistent with metastases. The most likely primary is gastric given the observation of a circumferentially thickened mid to distal stomach and gastrohepatic ligament adenopathy. The largest mass appears to be a coalescence of at least 3 masses involving portions of segments 5 and 8. This measures 7.7 x 6.6 cm on 3:18. There is a segment 4 mass measuring 2.8 x 3.4 cm on 3:25, and a segment 1 mass significantly narrowing the intrahepatic IVC measuring 4.3 x 4.1 cm on 3:17. Out laterally in segment 2 there is another mass measuring 5.4 x 3.3 cm on 3:17. There are few additional scattered small masses in both lobes. The gallbladder and bile ducts are unremarkable. Pancreas: No abnormality Spleen: No abnormality. Adrenals/Urinary Tract: There is no adrenal mass. In the left upper pole there is a 4.1 cm homogeneous Bosniak 2 cyst, Hounsfield density is 22.2. A second Bosniak 2 cyst just below this measures 1.2 cm and 23 Hounsfield units, and on the right there is a third parapelvic Bosniak 2 cyst in the inferior pole measuring 1.9 cm on 23 Hounsfield units. No follow-up imaging is recommended. There is no discrete renal mass enhancement. There is contrast in the collecting systems and ureters which would obscure stones but there is no hydronephrosis. The bladder wall and lumen are unremarkable. There is dense contrast opacifying the bladder. Stomach/Bowel: Circumferentially thickened wall in the mid distal stomach particularly posteriorly and inferiorly, suspicious for infiltrating disease such  as carcinoma. The small bowel is normal caliber. The appendix is normal. There is moderate retained stool. No overt colonic wall thickening. Scattered uncomplicated diverticulosis. Vascular/Lymphatic: Aortic atherosclerosis. No AAA or acute vascular findings. There is mass effect on the intrahepatic IVC due to a segment 1 liver  metastasis described above. No thrombus is seen within it. The portal vein and branches are widely patent. There is an enlarged necrotic gastrohepatic ligament lymph measuring 2.4 x 2.2 cm on 3:21. There are pathologic lymph nodes inferior to the stomach as well, ranging from 9 mm short axis up to the largest right of the midline measuring 2.4 x 1.6 cm on 3:38. Additional evidence of peritoneal metastatic disease with left upper quadrant nodular peritoneal metastases measuring up to 1.6 x 1.5 cm on 3:26. No adenopathy is seen in the pelvis. Reproductive: Status post hysterectomy. No adnexal masses. Other: Small amount of pelvic ascites. No drainable pocket. No free air or free hemorrhage. Small umbilical fat hernia. Musculoskeletal: No regional bone metastasis is seen. Mild degenerative change thoracic and lumbar spine. IMPRESSION: 1. Multiple hepatic masses consistent with metastases. The most likely primary is gastric given the circumferentially thickened mid to distal stomach, and hypogastric and gastrohepatic ligament adenopathy. 2. 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 3. Small amount of pelvic ascites. 4. Aortic and coronary artery atherosclerosis. 5. Cardiomegaly with minimal pericardial effusion. 6. Umbilical fat hernia. 7. These results will be called to the ordering clinician or representative by the Radiologist Assistant, and communication documented in the PACS or [employer]. Aortic Atherosclerosis (ICD10-I70.0). Electronically Signed   By: Francis Quam M.D.   On: 08/02/2024 00:35   CT Angio Chest Pulmonary Embolism (PE) W or WO Contrast Result Date: 08/01/2024 CLINICAL DATA:  Fever tachycardia EXAM: CT ANGIOGRAPHY CHEST WITH CONTRAST TECHNIQUE: Multidetector CT imaging of the chest was performed using the standard protocol during bolus administration of intravenous contrast. Multiplanar CT image reconstructions and MIPs were obtained to evaluate the vascular anatomy. RADIATION  DOSE REDUCTION: This exam was performed according to the departmental dose-optimization program which includes automated exposure control, adjustment of the mA and/or kV according to patient size and/or use of iterative reconstruction technique. CONTRAST:  60mL OMNIPAQUE  IOHEXOL  350 MG/ML SOLN COMPARISON:  Chest x-ray 10/17/2023 FINDINGS: Cardiovascular: Satisfactory opacification of the pulmonary arteries to the segmental level. Small emboli within right upper lobe subsegmental branches. Small volume right lower lobe segmental and subsegmental PE. Mild aortic atherosclerosis. No aneurysm. Mild coronary vascular calcification. Borderline cardiomegaly. No pericardial effusion Mediastinum/Nodes: Patent trachea. No thyroid mass. No suspicious lymph nodes. Esophagus within normal limits except for small hiatal hernia Lungs/Pleura: Mild dependent atelectasis. No consolidation or pleural effusion. Punctate 2 mm lingular pulmonary nodule, no specific imaging follow-up is recommended. Upper Abdomen: Incompletely visualized is vague hypodensity in the central liver, series 5 image 114. Soft tissue nodularity within the left upper quadrant incompletely assessed, series 5 image 91 through 114. Musculoskeletal: No acute osseous abnormality. 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 IMPRESSION: 1. Small volume right upper lobe and right lower lobe segmental and subsegmental pulmonary emboli. 2. Incompletely visualized vague hypodensity in the central liver, raising concern for possible liver lesion/mass. In addition, there is incompletely visualized nodularity within the left upper quadrant as could be seen with peritoneal metastatic disease. Recommend dedicated abdominopelvic contrast-enhanced CT. 3. Critical Value/emergent results were called by telephone at the time  of interpretation on 08/01/2024 at 8:52  pm to provider NP Debby , who verbally acknowledged these results. Aortic Atherosclerosis (ICD10-I70.0). Electronically Signed   By: Luke Bun M.D.   On: 08/01/2024 20:53   Recent Labs    08/01/24 0527 08/02/24 0552  WBC 14.6*  --   HGB 7.3* 7.1*  HCT 23.8* 22.5*  PLT 385  --    Recent Labs    08/01/24 0527  NA 139  K 3.7  CL 105  CO2 21*  GLUCOSE 91  BUN 18  CREATININE 0.85  CALCIUM  8.6*    Intake/Output Summary (Last 24 hours) at 08/02/2024 0759 Last data filed at 08/01/2024 1814 Gross per 24 hour  Intake 60 ml  Output --  Net 60 ml        Physical Exam: Vital Signs Blood pressure 120/65, pulse 89, temperature 99.5 F (37.5 C), temperature source Oral, resp. rate 16, height 5' 2 (1.575 m), weight 55.4 kg, SpO2 100%.  Constitutional: No apparent distress. Appropriate appearance for age. Laying in bed, HENT: No JVD. Neck Supple. Trachea midline. Atraumatic, normocephalic. Eyes: PERRLA. EOMI. Visual fields grossly intact.  Cardiovascular: RRR, systolic murmur . No Edema. Peripheral pulses 2+.  Diffusely tender on palpation of the right anterior chest Respiratory: CTAB. No rales, rhonchi, or wheezing. On RA.  Abdomen: + bowel sounds, normoactive. No distention; diffusely tender on palpation Skin: C/D/I. No apparent lesions. MSK:      No apparent deformity.      Strength: Moving all 4 extremities in bed, equal and antigravity throughout.   Neurologic exam:  Cognition: Assessment limited by both expressive and receptive aphasia; cannot state name with clear wordfinding frustration. Follows 3/3 simple commands.  Insight: Poor insight into current condition.  Mood: Subdued, flat Sensation: Equal and intact in BL UE and Les.  Reflexes:  Negative Hoffman's and babinski signs bilaterally.  CN: Mild R facial droop Coordination: No apparent tremors. No ataxia   Spasticity: MAS 0 in all extremities.         Assessment/Plan: 1. Functional deficits which require  3+ hours per day of interdisciplinary therapy in a comprehensive inpatient rehab setting. Physiatrist is providing close team supervision and 24 hour management of active medical problems listed below. Physiatrist and rehab team continue to assess barriers to discharge/monitor patient progress toward functional and medical goals  Care Tool:  Bathing    Body parts bathed by patient: Right arm, Left arm, Chest, Abdomen, Front perineal area, Buttocks, Right upper leg, Left upper leg, Right lower leg, Left lower leg, Face         Bathing assist Assist Level: Contact Guard/Touching assist     Upper Body Dressing/Undressing Upper body dressing   What is the patient wearing?: Pull over shirt    Upper body assist Assist Level: Set up assist    Lower Body Dressing/Undressing Lower body dressing      What is the patient wearing?: Underwear/pull up, Pants     Lower body assist Assist for lower body dressing: Contact Guard/Touching assist     Toileting Toileting    Toileting assist Assist for toileting: Contact Guard/Touching assist     Transfers Chair/bed transfer  Transfers assist     Chair/bed transfer assist level: Minimal Assistance - Patient > 75%     Locomotion Ambulation   Ambulation assist      Assist level: Minimal Assistance - Patient > 75% Assistive device: No Device Max distance: 180'   Walk 10 feet activity  Assist     Assist level: Minimal Assistance - Patient > 75% Assistive device: No Device   Walk 50 feet activity   Assist    Assist level: Minimal Assistance - Patient > 75% Assistive device: No Device    Walk 150 feet activity   Assist    Assist level: Minimal Assistance - Patient > 75% Assistive device: No Device    Walk 10 feet on uneven surface  activity   Assist     Assist level: Minimal Assistance - Patient > 75% Assistive device: Other (comment) (No device, but holding to RHR)   Wheelchair     Assist Is  the patient using a wheelchair?: Yes Type of Wheelchair: Manual    Wheelchair assist level: Dependent - Patient 0%      Wheelchair 50 feet with 2 turns activity    Assist        Assist Level: Dependent - Patient 0%   Wheelchair 150 feet activity     Assist      Assist Level: Dependent - Patient 0%   Blood pressure 120/65, pulse 89, temperature 99.5 F (37.5 C), temperature source Oral, resp. rate 16, height 5' 2 (1.575 m), weight 55.4 kg, SpO2 100%.  Medical Problem List and Plan: 1. 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 Onc, GI consulted. Called patient's daughter and left VM to discuss; did discuss workup with patient this AM as well. Close monitorring for neurologic changes given heparin ggt and risk for hemorrhagic conversion; planning to place IVC filter and DC AC   2.  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  May need to reduce dose to 2.5 mg if hemoglobin continues to downtrend.  Will get CTA to evaluate for PE given ongoing tachycardia, leukocytosis, and low-grade fevers with generalized chest/abdominal pain and history of DVT 9-9 and 9-23--would need this evaluated prior to adjustments of anticoagulation   - 10-1: CTA overnight with PE right upper and lower lobe, started on heparin ggt without bolus overnight - planning for IVC placement with  heparin hold 4 hours prior    3. Pain Management: tylenol  prn.   - Would not schedule Tylenol  due to potential to mask fevers.   - 10/1: Tx as above; can now schedule tylenol  1000 mg TID   4. Mood/Behavior/Sleep: LCSW to follow for evaluation and support.             --melatonin prn insomnia.              -antipsychotic agents: N\/A   - 9-30: Withdrawn, poor attention.  Start fluoxetine 20 mg daily for motor recovery and behavior.  Start sleep log.   - sleep log appropriate  5. Neuropsych/cognition: This patient is not capable of making decisions on her own behalf.  6. Skin/Wound Care: Routine pressure relief measures.  7. Fluids/Electrolytes/Nutrition: Monitor I/O. Check CMET in am.    -9-30: A.m. labs stable  8.   Acute on chronic anemia: Monitor for signs of bleeding. H/H stable 6.1-->8.7.  FOBT -9-23. --add iron supplement as Iron level <10.  -  9-30: Hemoglobin downtrending, 7.3.  No obvious source of bleeding.  Added FOBT today.  Repeat H&H in AM; may need to transfuse if any lower.  Continue Eliquis  at current dose but if signs of bleeding may need to discuss adjusting to 2.5 mg daily. 10-1: Hemoglobin 7.1, on heparin drip. Monitor daily. Transfusion 1 U PRBC ordered; repeat h/h this evening. Holley Schmitz discussed with patient's daughter over the phone.   9.  Resting tachycardia: HR ranging in 110-120 at rest. CTA to rule out PE?             -pt is not in distress, breathing comfortably, no chest pain, on eliquis              --Afebrile X 24 hours.   --Monitor for symptoms with increase in activity.  9-30: Remained in the 90s overnight and throughout therapies this a.m.; coming up in the 110s this afternoon--stable 10/1  10. Leukocytosis: WBC on upward trend--16.0-->14.5-->16.3              -however, pt is afebrile, urine/chest clear, on full a/c --Sepsis work up prn temp elevation 9-30: WBCs downtrending 14.6--have been stable at approximately this level since 9-24.  No fevers,  chills, obvious lines of infection.  Is complaining of some chest pain and temperature 100 this afternoon; CTA ordered as above -10/1: Right upper lower lobe PE, along with multiple intra-abdominal masses concerning for metastatic cancer; most likely source. Tx PE as above and monitor.     11.  Elevated ALP, other LFTs are normal.  Trend.  12.  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  CT A/p 9/30 with: IMPRESSION: 1. Multiple hepatic masses consistent with metastases. The most likely primary is gastric given the circumferentially thickened mid to distal stomach, and hypogastric and gastrohepatic ligament adenopathy. 2. 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 3. Small amount of pelvic ascites.  - Called patient's daughter as above, left voicemail to review results. - Oncology consult placed for today; GI consult also placed for biopsy.    LOS: 2 days A FACE TO FACE EVALUATION WAS PERFORMED  Joesph JAYSON Likes 08/02/2024, 7:59 AM

## 2024-08-02 NOTE — Progress Notes (Addendum)
 Patient ID: Tiffany [name], female   DOB: [birth year], 83 y.o.   MRN: [id number]   1206- SW spoke with [name] to introduce self, explain role, discuss discharge process, and inform on ELOS. SW indicated she has spoken with PA about cancer work up and shared this is in process, She reports her mother is having an endoscopy tomorrow. SW discussed support at home. She reports between herself and her daughters during the week they will be available, and she works on the weekend and her husband will be home. SW discussed while there are other medical concerns, currently planning for d/c in the event her date does not change. Fam edu scheduled for next Tuesday 1p-4p with her and one of her daughters. SW shared will provide updates as available.   *medical team indicated HCPOA not appropriate due to aphasia.   Graeme Jude, MSW, LCSW Office: [phone] Cell: [phone] Fax: [phone]

## 2024-08-02 NOTE — Progress Notes (Signed)
 On call provider Fidela Ned notified of CT abdomen results. New orders to consult Hematology and Oncology received and placed. No acute distress noted and pt denies any pain at his time. Continue with plan of care.

## 2024-08-02 NOTE — Plan of Care (Signed)
 Occupational Therapy Note  Patient Details  Name: Tiffany [name] MRN: [id number] Date of Birth: 1941-02-27   Pt's plan of care adjusted to 15/7 after speaking with care team and discussed with MD in team conference as pt currently unable to tolerate current therapy schedule with OT, PT, and SLP.     Nereida Habermann, OTR/L, MSOT  08/02/2024, 4:02 PM

## 2024-08-02 NOTE — Consult Note (Addendum)
 Consultation  Referring Provider:  Dr. Emeline    Primary Care Physician:  Benjamine Aland, MD Primary Gastroenterologist: [place]        Reason for Consultation: Gastric Mass             HPI:   Tiffany [name] is a 83 y.o. female originally from [place], with a past medical history of hypertension, syncope, prediabetes, anemia, glaucoma, recent diagnosis of DVT on Eliquis , who was admitted to the hospital on 07/23/2024 with garbled speech and confusion.  Noted to have severe anemia with a hemoglobin of 6.1 and a CT of the head revealed acute/subacute left occipitotemporal infarct.  CTA negative for LVO and showed posterior L-MCA M3 or M4 branch occlusion.  MRI of the brain revealed large acute L-MCA territory infarct without changes, patchy sclerosis and thickening squamous portion of the left temporal bone additional tiny acute infarcts in the right perirolandic and frontotemporal cortex, right parietal and bilateral cerebellar hemispheres.  2D echo noted an EF of 60-65% mild AVR without stenosis.  She was transfused 2 units PRBCs anemia felt to be chronic.  Recommended outpatient colonoscopy for further eval.  Eliquis  resumed 9/22 with recommendations for 30-day event monitor after DC to rule out A-fib.  On 9/23 patient found Acute DVT in the right popliteal, peroneal and posterior tibial veins.  Ongoing issues with fever up to 101.2 on 9/27 with tachycardia treated with IVF and monitored off antibiotics.  Remains with expressive deficits requires minimal to moderate assistance with ADL tasks with difficulty sequencing initiation.    07/23/2024 patient initially admitted to the hospital for confusion.  Apparently daughter discussed that she was quite functional at baseline with a recent trip to [place]  by herself commenting lower extremity swelling and started on Eliquis  around 07/11/2024.  CT of the head with stroke.    We are consulted in regards to ongoing anemia and a finding of gastric mass on  CT.     08/01/24 patient found to have hemoglobin downtrending to 7.3 with no obvious source of bleeding, continued on Eliquis .  08/01/2024 CT angio for PE showed small volume right upper lobe and right lower lobe segmental and subsegmental pulmonary emboli, incompletely visualized vague hypodensity in the central liver raising concern for possible liver lesion/mass, additionally incompletely visualized nodularity within the left upper quadrant as could be seen with peritoneal metastatic disease, recommended abdominopelvic contrast-enhanced CT  08/01/2024 CTAP with contrast with multiple hepatic masses consistent with mets, most likely primary is gastric given the circumferentially thickened mid to distal stomach and hypogastric and gastrohepatic ligament adenopathy, peritoneal metastasis in the left upper quadrant, small amount of pelvic ascites, aortic and coronary artery atherosclerosis, cardiomegaly with minimal pericardial effusion, umbilical fat hernia  08/02/2024 hemoglobin 7.0 (8.96 days ago and has slowly trended down)  08/02/2024 patient currently on Heparin, recommendations for an IVC filter given 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     Today, patient noted to be aphasic, unable to answer my questions other than yes no.  Answers no when I ask her if she is aware of findings from CT.  I discussed them with her.  Per nursing staff I was told to discuss further with her daughter.  Does nod yes to having abdominal discomfort in her epigastric area.  No nausea or vomiting per nursing staff.    Discussed patient with her daughter over the phone.  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  She would like us  to proceed with  EGD for further biopsies.     GI History: 02/22/23 Cologuard negative  Past Medical History:  Diagnosis Date   Hypertension     History reviewed. No pertinent surgical history.  Family History  Problem Relation Age of Onset   Breast cancer Sister     Social  History   Tobacco Use   Smoking status: Never    Passive exposure: Never   Smokeless tobacco: Never  Vaping Use   Vaping status: Never Used  Substance Use Topics   Alcohol use: Yes   Drug use: Never    Prior to Admission medications   Medication Sig Start Date End Date Taking? Authorizing Provider  alendronate (FOSAMAX) 70 MG tablet Take 70 mg by mouth once a week. 09/09/23   [provider]  apixaban  (ELIQUIS ) 5 MG TABS tablet Take 5 mg by mouth 2 (two) times daily.    [provider]  atorvastatin  (LIPITOR) 20 MG tablet Take 1 tablet (20 mg total) by mouth daily. 08/01/24 08/31/24  Pahwani, Ravi, MD  bimatoprost (LUMIGAN) 0.03 % ophthalmic solution Place 1 drop into both eyes at bedtime.    [provider]  D3-50 1.25 MG (50000 UT) capsule Take 50,000 Units by mouth once a week. On mondays 09/09/23   [provider]  dorzolamide  (TRUSOPT ) 2 % ophthalmic solution Place 1 drop into both eyes 3 (three) times daily. 06/24/24   [provider]  LUMIGAN 0.01 % SOLN Place 1 drop into both eyes at bedtime. 07/14/24   [provider]  metoprolol  tartrate (LOPRESSOR ) 50 MG tablet Take 50 mg by mouth daily. 09/09/23   [provider]  mirtazapine  (REMERON ) 7.5 MG tablet Take 7.5 mg by mouth at bedtime.    [provider]  PREBIOTIC PRODUCT PO Take 1 capsule by mouth daily.    [provider]  predniSONE  (DELTASONE ) 5 MG tablet Take 5 mg by mouth daily. 04/25/24   [provider]  Probiotic Product (PROBIOTIC PO) Take 1 capsule by mouth daily.    [provider]    Current Facility-Administered Medications  Medication Dose Route Frequency Provider Last Rate Last Admin   0.9 %  sodium chloride  infusion (Manually program via Guardrails IV Fluids)   Intravenous Once Emeline Joesph BROCKS, DO       acetaminophen  (TYLENOL ) tablet 325-650 mg  325-650 mg Oral Q4H PRN Love, Pamela S, PA-C       alum & mag  hydroxide-simeth (MAALOX/MYLANTA) 200-200-20 MG/5ML suspension 30 mL  30 mL Oral Q4H PRN Love, Pamela S, PA-C       atorvastatin  (LIPITOR) tablet 20 mg  20 mg Oral Daily Love, Pamela S, PA-C   20 mg at 08/02/24 0918   bisacodyl (DULCOLAX) suppository 10 mg  10 mg Rectal Daily PRN Love, Pamela S, PA-C       diphenhydrAMINE (BENADRYL) capsule 25 mg  25 mg Oral Q6H PRN Love, Pamela S, PA-C       dorzolamide  (TRUSOPT ) 2 % ophthalmic solution 1 drop  1 drop Both Eyes TID Love, Pamela S, PA-C   1 drop at 08/02/24 0918   Fe Fum-Vit C-Vit B12-FA (TRIGELS-F FORTE) capsule 1 capsule  1 capsule Oral Q supper Love, Pamela S, PA-C   1 capsule at 08/01/24 1730   FLUoxetine (PROZAC) capsule 20 mg  20 mg Oral Daily Engler, Morgan C, DO   20 mg at 08/02/24 0918   guaiFENesin-dextromethorphan (ROBITUSSIN DM) 100-10 MG/5ML syrup 5-10 mL  5-10 mL Oral  Q6H PRN Maurice Sharlet RAMAN, PA-C       heparin ADULT infusion 100 units/mL (25000 units/250mL)  500 Units/hr Intravenous Continuous Marten Larraine HERO, RPH 5 mL/hr at 08/01/24 2258 500 Units/hr at 08/01/24 2258   latanoprost  (XALATAN ) 0.005 % ophthalmic solution 1 drop  1 drop Both Eyes QHS Maurice Sharlet RAMAN, PA-C   1 drop at 08/01/24 2040   melatonin tablet 5 mg  5 mg Oral QHS PRN Maurice Sharlet RAMAN, PA-C       metoprolol  tartrate (LOPRESSOR ) tablet 25 mg  25 mg Oral Daily Love, Pamela S, PA-C   25 mg at 08/02/24 9081   mirtazapine  (REMERON ) tablet 7.5 mg  7.5 mg Oral QHS Love, Pamela S, PA-C   7.5 mg at 08/01/24 2039   pantoprazole  (PROTONIX ) EC tablet 40 mg  40 mg Oral BID Love, Pamela S, PA-C   40 mg at 08/02/24 9081   predniSONE  (DELTASONE ) tablet 5 mg  5 mg Oral Daily Maurice Sharlet RAMAN, PA-C   5 mg at 08/02/24 9077   prochlorperazine  (COMPAZINE ) tablet 5-10 mg  5-10 mg Oral Q6H PRN Maurice Sharlet S, PA-C       Or   prochlorperazine  (COMPAZINE ) suppository 12.5 mg  12.5 mg Rectal Q6H PRN Love, Pamela S, PA-C       Or   prochlorperazine  (COMPAZINE ) injection 5-10 mg  5-10 mg  Intravenous Q6H PRN Love, Pamela S, PA-C       sodium phosphate (FLEET) enema 1 enema  1 enema Rectal Once PRN Maurice Sharlet RAMAN, PA-C        Allergies as of 07/31/2024 - Review Complete 07/31/2024  Allergen Reaction Noted   Elemental sulfur Hives 10/17/2023   Shellfish allergy Anaphylaxis 10/17/2023   Shellfish allergy  07/22/2024   Sulfa antibiotics Hives 10/17/2023     Review of Systems(minimal and taken from previous physicians notes due to aphasia):    Constitutional: No fever or chills Skin: No rash  Cardiovascular: No chest pain Respiratory: No SOB  Gastrointestinal: See HPI and otherwise negative Genitourinary: No dysuria  Neurological: No headache, dizziness or syncope Musculoskeletal: No new muscle or joint pain Hematologic: No bleeding  Psychiatric: No history of depression or anxiety    Physical Exam:  Vital signs in last 24 hours: Temp:  [98.4 F (36.9 C)-100 F (37.8 C)] 99.5 F (37.5 C) (10/01 0527) Pulse Rate:  [89-94] 89 (10/01 0527) Resp:  [16-19] 16 (10/01 0527) BP: (110-120)/(60-66) 120/65 (10/01 0527) SpO2:  [98 %-100 %] 100 % (10/01 0527) Last BM Date : 07/30/24 General:  AA female appears to be in NAD, Well developed, Well nourished, alert and cooperative Head:  Normocephalic and atraumatic. Eyes:   PEERL, EOMI. No icterus. Conjunctiva pink. Ears:  Normal auditory acuity. Neck:  Supple Throat: Oral cavity and pharynx without inflammation, swelling or lesion.  Lungs: Respirations even and unlabored. Lungs clear to auscultation bilaterally.   No wheezes, crackles, or rhonchi. +tender over RIght anterior chest to palpation Heart: Normal S1, S2. No MRG. Regular rate and rhythm. No peripheral edema, cyanosis or pallor.  Abdomen:  Soft, moderate generalized ttp, some worse in the epigastrum, nondistended No rebound or guarding. Normal bowel sounds.  Rectal:  Not performed.  Msk:  Symmetrical without gross deformities. Peripheral pulses intact.   Extremities:  Without edema, no deformity or joint abnormality.  Neurologic:  Alert and  oriented; +expressive and receptive Aphasia Skin:   Dry and intact without significant lesions or rashes. Psychiatric: +memory deficitis, Demonstrates  good judgement and reason. +mood flat  LAB RESULTS: Recent Labs    08/01/24 0527 08/02/24 0552 08/02/24 0942  WBC 14.6*  --  14.1*  HGB 7.3* 7.1* 7.0*  HCT 23.8* 22.5* 22.8*  PLT 385  --  473*   BMET Recent Labs    08/01/24 0527  NA 139  K 3.7  CL 105  CO2 21*  GLUCOSE 91  BUN 18  CREATININE 0.85  CALCIUM  8.6*      Latest Ref Rng & Units 08/01/2024    5:27 AM 07/24/2024    2:17 AM 07/22/2024   10:21 PM  Hepatic Function  Total Protein 6.5 - 8.1 g/dL 6.2  6.5  7.8   Albumin 3.5 - 5.0 g/dL 2.1  2.3  2.7   AST 15 - 41 U/L 37  23  25   ALT 0 - 44 U/L 28  15  16    Alk Phosphatase 38 - 126 U/L 171  141  149   Total Bilirubin 0.0 - 1.2 mg/dL 0.5  1.2  0.9      STUDIES: CT ABDOMEN PELVIS W CONTRAST Result Date: 08/02/2024 CLINICAL DATA:  The patient had a CTA chest earlier today for pulmonary embolism. A mass was noted in the central liver as well as evidence of peritoneal metastases in the left upper abdomen. EXAM: CT ABDOMEN AND PELVIS WITH CONTRAST TECHNIQUE: Multidetector CT imaging of the abdomen and pelvis was performed using the standard protocol following bolus administration of intravenous contrast. RADIATION DOSE REDUCTION: This exam was performed according to the departmental dose-optimization program which includes automated exposure control, adjustment of the mA and/or kV according to patient size and/or use of iterative reconstruction technique. CONTRAST:  60mL OMNIPAQUE  IOHEXOL  350 MG/ML SOLN COMPARISON:  Ultrasound complete abdomen 01/12/2019 was negative except for a 5 cm left renal cyst. The only other cross-sectional comparison is today's CTA chest. FINDINGS: Lower chest: There is mild cardiomegaly, minimal pericardial  effusion. Scattered three-vessel coronary calcifications. Lung bases are clear apart from posterior atelectatic change in the lower lobes. Hepatobiliary: There are multiple hepatic masses consistent with metastases. The most likely primary is gastric given the observation of a circumferentially thickened mid to distal stomach and gastrohepatic ligament adenopathy. The largest mass appears to be a coalescence of at least 3 masses involving portions of segments 5 and 8. This measures 7.7 x 6.6 cm on 3:18. There is a segment 4 mass measuring 2.8 x 3.4 cm on 3:25, and a segment 1 mass significantly narrowing the intrahepatic IVC measuring 4.3 x 4.1 cm on 3:17. Out laterally in segment 2 there is another mass measuring 5.4 x 3.3 cm on 3:17. There are few additional scattered small masses in both lobes. The gallbladder and bile ducts are unremarkable. Pancreas: No abnormality Spleen: No abnormality. Adrenals/Urinary Tract: There is no adrenal mass. In the left upper pole there is a 4.1 cm homogeneous Bosniak 2 cyst, Hounsfield density is 22.2. A second Bosniak 2 cyst just below this measures 1.2 cm and 23 Hounsfield units, and on the right there is a third parapelvic Bosniak 2 cyst in the inferior pole measuring 1.9 cm on 23 Hounsfield units. No follow-up imaging is recommended. There is no discrete renal mass enhancement. There is contrast in the collecting systems and ureters which would obscure stones but there is no hydronephrosis. The bladder wall and lumen are unremarkable. There is dense contrast opacifying the bladder. Stomach/Bowel: Circumferentially thickened wall in the mid distal stomach particularly posteriorly and inferiorly,  suspicious for infiltrating disease such as carcinoma. The small bowel is normal caliber. The appendix is normal. There is moderate retained stool. No overt colonic wall thickening. Scattered uncomplicated diverticulosis. Vascular/Lymphatic: Aortic atherosclerosis. No AAA or acute  vascular findings. There is mass effect on the intrahepatic IVC due to a segment 1 liver metastasis described above. No thrombus is seen within it. The portal vein and branches are widely patent. There is an enlarged necrotic gastrohepatic ligament lymph measuring 2.4 x 2.2 cm on 3:21. There are pathologic lymph nodes inferior to the stomach as well, ranging from 9 mm short axis up to the largest right of the midline measuring 2.4 x 1.6 cm on 3:38. Additional evidence of peritoneal metastatic disease with left upper quadrant nodular peritoneal metastases measuring up to 1.6 x 1.5 cm on 3:26. No adenopathy is seen in the pelvis. Reproductive: Status post hysterectomy. No adnexal masses. Other: Small amount of pelvic ascites. No drainable pocket. No free air or free hemorrhage. Small umbilical fat hernia. Musculoskeletal: No regional bone metastasis is seen. Mild degenerative change thoracic and lumbar spine. IMPRESSION: 1. Multiple hepatic masses consistent with metastases. The most likely primary is gastric given the circumferentially thickened mid to distal stomach, and hypogastric and gastrohepatic ligament adenopathy. 2. 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 3. Small amount of pelvic ascites. 4. Aortic and coronary artery atherosclerosis. 5. Cardiomegaly with minimal pericardial effusion. 6. Umbilical fat hernia. 7. These results will be called to the ordering clinician or representative by the Radiologist Assistant, and communication documented in the PACS or [employer]. Aortic Atherosclerosis (ICD10-I70.0). Electronically Signed   By: Francis Quam M.D.   On: 08/02/2024 00:35   CT Angio Chest Pulmonary Embolism (PE) W or WO Contrast Result Date: 08/01/2024 CLINICAL DATA:  Fever tachycardia EXAM: CT ANGIOGRAPHY CHEST WITH CONTRAST TECHNIQUE: Multidetector CT imaging of the chest was performed using the standard protocol during bolus administration of intravenous contrast. Multiplanar CT  image reconstructions and MIPs were obtained to evaluate the vascular anatomy. RADIATION DOSE REDUCTION: This exam was performed according to the departmental dose-optimization program which includes automated exposure control, adjustment of the mA and/or kV according to patient size and/or use of iterative reconstruction technique. CONTRAST:  60mL OMNIPAQUE  IOHEXOL  350 MG/ML SOLN COMPARISON:  Chest x-ray 10/17/2023 FINDINGS: Cardiovascular: Satisfactory opacification of the pulmonary arteries to the segmental level. Small emboli within right upper lobe subsegmental branches. Small volume right lower lobe segmental and subsegmental PE. Mild aortic atherosclerosis. No aneurysm. Mild coronary vascular calcification. Borderline cardiomegaly. No pericardial effusion Mediastinum/Nodes: Patent trachea. No thyroid mass. No suspicious lymph nodes. Esophagus within normal limits except for small hiatal hernia Lungs/Pleura: Mild dependent atelectasis. No consolidation or pleural effusion. Punctate 2 mm lingular pulmonary nodule, no specific imaging follow-up is recommended. Upper Abdomen: Incompletely visualized is vague hypodensity in the central liver, series 5 image 114. Soft tissue nodularity within the left upper quadrant incompletely assessed, series 5 image 91 through 114. Musculoskeletal: No acute osseous abnormality. 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 IMPRESSION: 1. Small volume right upper lobe and right lower lobe segmental and subsegmental pulmonary emboli. 2. Incompletely visualized vague hypodensity in the central liver, raising concern for possible liver lesion/mass. In addition, there is incompletely visualized nodularity within the left upper quadrant as could be seen with peritoneal metastatic disease. Recommend dedicated abdominopelvic contrast-enhanced CT. 3. Critical  Value/emergent results were  called by telephone at the time of interpretation on 08/01/2024 at 8:52 pm to provider NP Debby , who verbally acknowledged these results. Aortic Atherosclerosis (ICD10-I70.0). Electronically Signed   By: Luke Bun M.D.   On: 08/01/2024 20:53     Impression / Plan:   Tiffany [name] is a 83 y.o. female originally from [place], with a past medical history of hypertension, syncope, prediabetes, anemia, glaucoma, recent diagnosis of DVT on Eliquis , who was admitted to the hospital on 07/23/2024 with garbled speech and confusion.  Noted to have severe anemia with a hemoglobin of 6.1 and a CT of the head revealed acute/subacute left occipitotemporal infarct.  Since her admission she has been discharged to physical medicine and rehab.  Noted to have continued anemia and found to have gastric mass as well as likely mets to the liver on recent imaging.  Impression: 1.  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  Stroke with functional deficits 4.  DVT: History of right popliteal DVT 9-9 started on Eliquis , repeat bilateral extremity Dopplers done revealing acute DVT in the right popliteal, peroneal and posterior tibial veins 9/23 5.  Leukocytosis: Downtrending, stable since 07/22/2024  Plan: 1.  Discussed plan for EGD with the patient's daughter.  She would like us  to proceed.  Did review risks, benefits, limitations and alternatives with patient and daughter who agrees to proceed.  This will be scheduled for tomorrow Dr. San. 2.  Patient can be on her current diet today and then will be n.p.o. at midnight 3.   Continue to monitor hemoglobin transfusion as needed less than 7 4.  Continue Pantoprazole  40 mg PO twice daily 5. Recheck cbc and cmp tomorrow morning 6.  Will stop heparin 4 hours prior to procedure.  Thank you for your kind consultation, we will continue to follow.  Delon Gibson [facility]  08/02/2024, 10:51 AM   Attending physician's note   I have taken a history, reviewed the chart, and examined the patient. I performed a substantive portion of this encounter, including complete performance of at least one of the key components, in conjunction with the APP. I agree with the APP's note, impression, and recommendations with my edits.   83 year old female with a history of DVT (recently started on Eliquis ) and other medical history as above, admitted 07/23/2024 with acute CVA.  Noted to have severe anemia with hemoglobin 6.1 on admission and transfused 2 units.  Eliquis  on 9/22 and ultrasound on 9/23 with acute DVT.  Downtrending hemoglobin on 9/30 but CT angio on 9/30 + for PE and was transition to heparin gtt.  CT on 9/30 with multiple hepatic masses, circumferential thickening in the distal stomach, peritoneal lesions, pelvic ascites, and gastrohepatic ligament adenopathy, all concerning for metastatic disease.  Baseline H/H in 10/2023 about 9/28.  Negative Cologuard in 02/2023 and 02/2020.  Patient is aphasic and questions limited to yes/no responses.  1) Acute on chronic anemia 2) CVA 3) DVT/PE 4) Adenopathy on imaging 5) Aphasia Oncology service consulted and has evaluated patient as well.  Imaging certainly concerning for gastric CA with metastases with hypercoagulability. - Tumor markers drawn and  pending - After conversation with patient's daughter, plan to proceed with EGD for diagnostic intent - Serial CBC checks with blood products as needed per protocol - Hold heparin 4 hours prior to EGD - N.p.o. at midnight   Hernando Reali, DO, FACG [phone] office   A total of  75 minutes of time was spent on this encounter, including in depth chart review, independent review of results as outlined above, communicating results with the patient directly, face-to-face time with the patient, coordinating care, ordering studies and medications as appropriate, and documentation.

## 2024-08-02 NOTE — Consult Note (Signed)
 Neuropsychological Consultation Comprehensive Inpatient Rehab   Patient:   Tiffany [name]   DOB:   December 17, 1940  MR Number:  [id number]  Location:  [facility] [address] CENTER A [address] [place] [address] Dept: [phone] Loc: [phone]           Date of Service:   08/02/2024  Start Time:   3 PM End Time:   4 PM  Provider/Observer:  Norleen Asa, Psy.D.       Clinical Neuropsychologist       Billing Code/Service: [phone]  Reason for Service:    Tiffany [name] is an 83 year old female referred for neuropsychological consultation during her current admission to the comprehensive inpatient rehabilitation unit following a cerebral vascular accident (CVA). Referral is to address cognitive changes and difficulty with coping.  Admitted on 07/23/2024 with garbled speech and confusion. Imaging revealed an acute/subacute left occipital-temporal infarct. CT was negative for large vessel occlusion. MRI showed a large acute left MCA territory infarct, with additional tiny acute infarcts in the right periolentic and frontotemporal cortex, right parietal, and bilateral cerebellar hemispheres. Other findings include pachygyria, sclerosis, and thickening of the squamous portion of the left temporal bone. Clinical course complicated by a deep vein thrombosis (DVT), fever, and tachycardia. Therapy consultations identified expressive deficits, need for minimal to moderate assistance with ADLs, sequencing difficulties, apraxia, and cognitive limitations, leading to a recommendation for comprehensive inpatient rehabilitation.  Past medical history includes hypertension, syncopal episodes, pre-diabetes, anemia, glaucoma, and recent deep vein thrombosis (DVT).  MENTAL STATUS EXAMINATION & BEHAVIORAL OBSERVATIONS:     Presents with significant cognitive deficits.  Patient has poor insight into her medical condition and the reason for  hospitalization, stating she does not know what happened.  This is even when accounting for her expressive language issues.  When informed she had a stroke, she acknowledged hearing it but demonstrated limited understanding. Disoriented to year, month, and location. Correctly identified she is in a hospital but could not name it or the city. Mood was congruent with the situation. Speech was not fluent with garbled speech and attempting to mumble through answers and apparent attempt to avoid making paraphasic errors.  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 Current presentation is characterized by profound anosognosia and disorientation. Expressive deficits, apraxia, and functional declines are consistent with the location and extent of the cerebrovascular event. Prognosis for cognitive recovery is guarded but improvement is expected with continued rehabilitation.  Even adjusting for the expressive language capacity limitations the patient displayed significant cognitive deficits in executive functioning, attention, problem-solving etc.   Disposition/Plan:    1.  Continue with the current comprehensive inpatient rehabilitation program, including physical therapy, occupational therapy, and speech therapy. 2.  Provide reality orientation and frequent, simple explanations regarding her condition and plan of care. 3.  Family education regarding the nature of her cognitive deficits, particularly the anosognosia, will be  important for discharge planning. 4.  Will remain available to follow for  serial assessment of cognitive and functional status throughout her rehabilitation stay. 5.  Reassured the individual that she has a good team and is in good hands to facilitate recovery. Explained my role as the neuropsychologist and will check in later in the week.  Diagnosis:    Cognitive and neurobehavioral dysfunction/disorder         Electronically Signed   _______________________ Norleen Asa, Psy.D. Clinical Neuropsychologist

## 2024-08-02 NOTE — Progress Notes (Signed)
 Inpatient Rehabilitation Care Coordinator Assessment and Plan Patient Details  Name: Tiffany [name] MRN: [id number] Date of Birth: 1941/07/26  Today's Date: 08/02/2024  Hospital Problems: Principal Problem:   Acute ischemic left middle cerebral artery (MCA) stroke [facility])  Past Medical History:  Past Medical History:  Diagnosis Date   Hypertension    Past Surgical History: History reviewed. No pertinent surgical history. Social History:  reports that she has never smoked. She has never been exposed to tobacco smoke. She has never used smokeless tobacco. She reports current alcohol use. She reports that she does not use drugs.  Family / Support Systems Marital Status: Divorced Patient Roles: Parent Children: Dtr [name] Other Supports: grandchildren Anticipated Caregiver: dtr [name] Ability/Limitations of Caregiver: Pt will d/c tohome with her dtr and SIL who will provide 24/7 care. Pt dtr and granddaughters to help during the week, pt dtr works on weekend and her husband will assist. Caregiver Availability: 24/7 Family Dynamics: Pt lives with her dtr, and her family  Social History Preferred language: English Religion: Unknown Cultural Background: From [place] [facility] Literacy - How often do you need to have someone help you when you read instructions, pamphlets, or other written material from your doctor or pharmacy?: Patient unable to respond Writes: Yes Employment Status: Retired   Abuse/Neglect Abuse/Neglect Assessment Can Be Completed: Unable to assess, patient is non-responsive or altered mental status  Patient response to: Social Isolation - How often do you feel lonely or isolated from those around you?: Patient unable to respond  Emotional Status    Patient / Family Perceptions, Expectations & Goals Pt/Family understanding of illness & functional limitations: Pt family has a general understanding of care needs Premorbid pt/family roles/activities:  Independent Anticipated changes in roles/activities/participation: Assistance with ADLs/IADLs  Recruitment consultant Agencies: None Premorbid Home Care/DME Agencies: None Transportation available at discharge: Dtr [name] Is the patient able to respond to transportation needs?: Yes In the past 12 months, has lack of transportation kept you from medical appointments or from getting medications?: No In the past 12 months, has lack of transportation kept you from meetings, work, or from getting things needed for daily living?: No Resource referrals recommended: Neuropsychology  Discharge Planning Living Arrangements: Children, Other relatives Support Systems: Children, Other relatives Type of Residence: Private residence Insurance Resources: Media planner (specify) ([employer] Medicare) Financial Resources: Restaurant manager, fast food Screen Referred: No Living Expenses: Lives with family Money Management: Family Does the patient have any problems obtaining your medications?: No Care Coordinator Barriers to Discharge: Decreased caregiver support, Lack of/limited family support, Insurance for SNF coverage Care Coordinator Anticipated Follow Up Needs: HH/OP Expected length of stay: D/c 10/10  Clinical Impression Chart audit completed and information from collateral reports to complete assessment.   Geffrey Michaelsen A Sorah Falkenstein 08/02/2024, 3:14 PM

## 2024-08-02 NOTE — PMR Pre-admission (Shared)
 PMR Admission Coordinator Pre-Admission Assessment  Patient: Tiffany [name] is an 83 y.o., female MRN: [id number] DOB: 1941/05/02 Height: 5' (152.4 cm) Weight: 59 kg              Insurance Information HMO: ***    PPO: ***     PCP: ***     IPA: ***     80/20: ***     OTHER: *** PRIMARY: ***      Policy#: ***      Subscriber: *** CM Name: ***      Phone#: ***     Fax#: *** Pre-Cert#: ***      Employer: *** Benefits:  Phone #: ***     Name: *** Eff. Date: ***     Deduct: ***      Out of Pocket Max: ***      Life Max: ***  CIR: ***      SNF: *** Outpatient: ***     Co-Pay: *** Home Health: ***      Co-Pay: *** DME: ***     Co-Pay: *** Providers: *** SECONDARY: ***      Policy#: ***      Phone#: ***  Financial Counselor: ***      Phone#: ***  The "Data Collection Information Summary" for patients in Inpatient Rehabilitation Facilities with attached "Privacy Act Statement-Health Care Records" was provided and verbally reviewed with: {CHL IP Patient Family WJ:[id number]}  Emergency Contact Information Contact Information     Name Relation Home Work Mobile   [name],[name] Daughter   [phone]   Melvenia Oneta CROME Daughter   [phone]      Other Contacts   None on File    Current Medical History  Patient Admitting Diagnosis: *** History of Present Illness: *** Complete NIHSS TOTAL: 1 Glasgow Coma Scale Score: 14  Patient's medical record from *** has been reviewed by the rehabilitation admission coordinator and physician.  Past Medical History  Past Medical History:  Diagnosis Date   Hypertension     Has the patient had major surgery during 100 days prior to admission? {Yes/No/Unknown:304600602}  Family History  family history includes Breast cancer in her sister.   Current Medications  No current facility-administered medications for this encounter. No current outpatient medications on file.  Facility-Administered Medications Ordered in Other Encounters:    0.9  %  sodium chloride  infusion (Manually program via Guardrails IV Fluids), , Intravenous, Once, Emeline Search C, DO   acetaminophen  (TYLENOL ) tablet 325-650 mg, 325-650 mg, Oral, Q4H PRN, Love, Pamela S, PA-C   alum & mag hydroxide-simeth (MAALOX/MYLANTA) 200-200-20 MG/5ML suspension 30 mL, 30 mL, Oral, Q4H PRN, Love, Pamela S, PA-C   atorvastatin  (LIPITOR) tablet 20 mg, 20 mg, Oral, Daily, Love, Pamela S, PA-C, 20 mg at 08/02/24 9081   bisacodyl (DULCOLAX) suppository 10 mg, 10 mg, Rectal, Daily PRN, Love, Pamela S, PA-C   diphenhydrAMINE (BENADRYL) capsule 25 mg, 25 mg, Oral, Q6H PRN, Love, Pamela S, PA-C   dorzolamide  (TRUSOPT ) 2 % ophthalmic solution 1 drop, 1 drop, Both Eyes, TID, Love, Pamela S, PA-C, 1 drop at 08/02/24 0918   Fe Fum-Vit C-Vit B12-FA (TRIGELS-F FORTE) capsule 1 capsule, 1 capsule, Oral, Q supper, Love, Pamela S, PA-C, 1 capsule at 08/01/24 1730   FLUoxetine (PROZAC) capsule 20 mg, 20 mg, Oral, Daily, Engler, Morgan C, DO, 20 mg at 08/02/24 0918   guaiFENesin-dextromethorphan (ROBITUSSIN DM) 100-10 MG/5ML syrup 5-10 mL, 5-10 mL, Oral, Q6H PRN, Love, Pamela S, PA-C   heparin  ADULT infusion 100 units/mL (25000 units/250mL), 500 Units/hr, Intravenous, Continuous, Marten Larraine HERO, RPH, Last Rate: 5 mL/hr at 08/01/24 2258, 500 Units/hr at 08/01/24 2258   latanoprost  (XALATAN ) 0.005 % ophthalmic solution 1 drop, 1 drop, Both Eyes, QHS, Love, Pamela S, PA-C, 1 drop at 08/01/24 2040   melatonin tablet 5 mg, 5 mg, Oral, QHS PRN, Love, Pamela S, PA-C   metoprolol  tartrate (LOPRESSOR ) tablet 25 mg, 25 mg, Oral, Daily, Love, Pamela S, PA-C, 25 mg at 08/02/24 9081   mirtazapine  (REMERON ) tablet 7.5 mg, 7.5 mg, Oral, QHS, Love, Pamela S, PA-C, 7.5 mg at 08/01/24 2039   pantoprazole  (PROTONIX ) EC tablet 40 mg, 40 mg, Oral, BID, Love, Pamela S, PA-C, 40 mg at 08/02/24 9081   predniSONE  (DELTASONE ) tablet 5 mg, 5 mg, Oral, Daily, Love, Pamela S, PA-C, 5 mg at 08/02/24 9077   prochlorperazine   (COMPAZINE ) tablet 5-10 mg, 5-10 mg, Oral, Q6H PRN **OR** prochlorperazine  (COMPAZINE ) suppository 12.5 mg, 12.5 mg, Rectal, Q6H PRN **OR** prochlorperazine  (COMPAZINE ) injection 5-10 mg, 5-10 mg, Intravenous, Q6H PRN, Love, Pamela S, PA-C   sodium phosphate (FLEET) enema 1 enema, 1 enema, Rectal, Once PRN, Love, Pamela S, PA-C  Patients Current Diet:  Diet Order     None       Precautions / Restrictions Precautions Precautions: Fall Precaution/Restrictions Comments: R visual field cut Restrictions Weight Bearing Restrictions Per Provider Order: No   Has the patient had 2 or more falls or a fall with injury in the past year?{Yes/No/Unknown:304600602}  Prior Activity Level Limited Community (1-2x/wk): gets out of house ~2-3 days/week  Prior Functional Level Prior Function Prior Level of Function : Independent/Modified Independent, Driving Mobility Comments: pt flew from [facility] by herself 2 weeks ago PTA ADLs Comments: Pt enjoys sewing; has a pillbox but does not use it  Self Care: Did the patient need help bathing, dressing, using the toilet or eating?  {Prior Functional Ozczo:695399399}  Indoor Mobility: Did the patient need assistance with walking from room to room (with or without device)? {Prior Functional Ozczo:695399399}  Stairs: Did the patient need assistance with internal or external stairs (with or without device)? {Prior Functional Ozczo:695399399}  Functional Cognition: Did the patient need help planning regular tasks such as shopping or remembering to take medications? {Prior Functional Ozczo:695399399}  Patient Information Are you of Hispanic, Latino/a,or Spanish origin?: X. Patient unable to respond, A. No, not of Hispanic, Latino/a, or Spanish origin What is your race?: X. Patient unable to respond, B. Black or African American Do you need or want an interpreter to communicate with a doctor or health care staff?: 9. Unable to respond Patient information obtained  via proxy : daughter answered questions (pt does not need an interpreter)  Patient's Response To:  Health Literacy and Transportation Is the patient able to respond to health literacy and transportation needs?: No Health Literacy - How often do you need to have someone help you when you read instructions, pamphlets, or other written material from your doctor or pharmacy?: Patient unable to respond In the past 12 months, has lack of transportation kept you from medical appointments or from getting medications?: No In the past 12 months, has lack of transportation kept you from meetings, work, or from getting things needed for daily living?: No Health Literacy and Transportation obtained via proxy: daughter answered the questions (pt able to read medical information and pt has has not missed appointements d/t transportation issues.)  Journalist, newspaper / Equipment Home Equipment: None  Prior  Device Use: Indicate devices/aids used by the patient prior to current illness, exacerbation or injury? {Prior Device Ldzi:695399398}  Current Functional Level Cognition  Arousal/Alertness: Awake/alert Overall Cognitive Status: Impaired/Different from baseline Orientation Level: Oriented to person, Oriented to place, Oriented to situation, Disoriented to time Comments: TBD as communication improves, does not appear to have awareness of language deficits however    Extremity Assessment (includes Sensation/Coordination)  Upper Extremity Assessment: Generalized weakness  Lower Extremity Assessment: Difficult to assess due to impaired cognition (Able to activate quadriceps and extend knees with extensive cueing; grossly equal strength bilaterally, but unable to test functional strength with stnding)    ADLs  Overall ADL's : Needs assistance/impaired Grooming: Moderate assistance, Oral care, Wash/dry hands, Cueing for sequencing, Standing Grooming Details (indicate cue type and reason): heavy  multimodal cues for verbally identifying and locating grooming items located to the R side, cued for proper sequencing and problem solving through task initiation/termination Upper Body Dressing : Minimal assistance, Cueing for sequencing, Sitting Upper Body Dressing Details (indicate cue type and reason): initially donned shirt in wrong arm hole, cued to identify problem and formulate solution, cued for proper sequencing and item identification Toilet Transfer: Minimal assistance, Cueing for safety, Ambulation, Regular Toilet Toilet Transfer Details (indicate cue type and reason): cues for safe approach to toilet and to control descent/use of grab bars Toileting- Clothing Manipulation and Hygiene: Minimal assistance, Cueing for safety, Sit to/from stand Toileting - Clothing Manipulation Details (indicate cue type and reason): prompted to complete clothing mgmt down, performed anterior peri care seated Functional mobility during ADLs: Minimal assistance, Cueing for safety General ADL Comments: Pt bumped into doorframe on the R side upon return to room likely 2/2 visual impairment.    Mobility  Overal bed mobility: Needs Assistance Bed Mobility: Sit to Supine Supine to sit: Contact guard, HOB elevated, Used rails Sit to supine: Supervision, HOB elevated General bed mobility comments: increased effort to get LE into bed.    Transfers  Overall transfer level: Needs assistance Equipment used: None Transfers: Sit to/from Stand Sit to Stand: Contact guard assist Bed to/from chair/wheelchair/BSC transfer type:: Step pivot Step pivot transfers: Min assist General transfer comment: PT requires steadying once up in standing.    Ambulation / Gait / Stairs / Wheelchair Mobility  Ambulation/Gait Ambulation/Gait assistance: Min assist, Mod assist Gait Distance (Feet): 300 Feet Assistive device: None Gait Pattern/deviations: Step-through pattern, Decreased stride length, Staggering left, Staggering  right, Drifts right/left General Gait Details: MinA over level surface negotiation, up to modA 2x with obstacles or in tight spaces. Right lateral lean and drift, running into right side of the hall. Improved moderately with verbal cues for using tiles on the floor for maintaining a straight line. Gait velocity: decreased Gait velocity interpretation: <1.8 ft/sec, indicate of risk for recurrent falls Stairs: Yes Stairs assistance: Min assist Stair Management: One rail Right, One rail Left, Step to pattern Number of Stairs: 2 General stair comments: Min A for navigating stairs with rails.    Posture / Balance Dynamic Sitting Balance Sitting balance - Comments: no overt LOB observed Balance Overall balance assessment: Needs assistance Sitting-balance support: No upper extremity supported, Feet supported Sitting balance-Leahy Scale: Good Sitting balance - Comments: no overt LOB observed Postural control: Other (comment) (anterior lean intermittently during mobility) Standing balance support: Single extremity supported, During functional activity, No upper extremity supported Standing balance-Leahy Scale: Poor Standing balance comment: HHA provided for functional mobility, pt often scissoring feet, with several lateral LOB, especially during dual  tasking & dividing attention Standardized Balance Assessment Standardized Balance Assessment : Dynamic Gait Index Dynamic Gait Index Level Surface: Moderate Impairment Change in Gait Speed: Moderate Impairment Gait with Horizontal Head Turns: Severe Impairment Gait with Vertical Head Turns: Severe Impairment Gait and Pivot Turn: Moderate Impairment Step Over Obstacle: Severe Impairment Step Around Obstacles: Severe Impairment Steps: Moderate Impairment Total Score: 4    Special considerations/ Life events {Special Care Needs/Care Considerations:304600603}     Previous Home Environment (from acute therapy documentation) Living  Arrangements: Children  Lives With: Daughter Available Help at Discharge: Family Type of Home: House Home Layout: Able to live on main level with bedroom/bathroom Home Access: Stairs to enter Entrance Stairs-Rails: None (wall on one side) Entrance Stairs-Number of Steps: 5 STE Bathroom Shower/Tub: Health visitor: Standard Bathroom Accessibility: Yes How Accessible: Accessible via walker Home Care Services: No Additional Comments: Pt's daughter works Museum/gallery exhibitions officer; picks up weekday hours occasionally. Pt SIL is home Saturday and Sunday. Pt sister who lives in [place] is willing to come and help out.  Discharge Living Setting Plans for Discharge Living Setting: Patient's home Type of Home at Discharge: House Discharge Home Layout: Two level, Able to live on main level with bedroom/bathroom Discharge Home Access: Stairs to enter Entrance Stairs-Rails: None (wall on left side) Entrance Stairs-Number of Steps: 5 Discharge Bathroom Shower/Tub: Walk-in shower Discharge Bathroom Toilet: Standard Discharge Bathroom Accessibility: Yes How Accessible: Accessible via walker Does the patient have any problems obtaining your medications?: No  Social/Family/Support Systems Anticipated Caregiver: Oneta Fireman (daughter) and other family Anticipated Caregiver's Contact Information: [phone] Caregiver Availability: 24/7 Discharge Plan Discussed with Primary Caregiver: Yes Is Caregiver In Agreement with Plan?: Yes Does Caregiver/Family have Issues with Lodging/Transportation while Pt is in Rehab?: No   Goals Patient/Family Goal for Rehab: Supervision: PT/OT, Supervision-Min A:ST Expected length of stay: 7-9 days Pt/Family Agrees to Admission and willing to participate: Yes Program Orientation Provided & Reviewed with Pt/Caregiver Including Roles  & Responsibilities: Yes   Decrease burden of Care through IP rehab admission: {Inpatient Rehab Care:20780}   Possible need for SNF  placement upon discharge:***   Patient Condition: {PATIENT'S CONDITION:22832}  Preadmission Screen Completed By:  Reche FORBES Lowers, PT, 08/02/2024 10:41 AM ______________________________________________________________________   Discussed status with Dr. PIERRETTEon***at *** and received approval for admission today.  Admission Coordinator:  Shermar Friedland E Leeana Creer, time***/Date***

## 2024-08-02 NOTE — Progress Notes (Signed)
 Physical Therapy Session Note  Patient Details  Name: Tiffany [name] MRN: [id number] Date of Birth: 1941/07/21  Today's Date: 08/02/2024 PT Individual Time: 1531-1609 PT Individual Time Calculation (min): 38 min   Short Term Goals: Week 1:  PT Short Term Goal 1 (Week 1): STG = LTG due to ELOS  Skilled Therapeutic Interventions/Progress Updates:    Pt presents in room in bed, agreeable to PT session with therapist encouragement. Pt does not report pain, but increased time required for pt to express confusion over reason for being in hospital as well as reason for IV lines. Session focused on therapeutic activities for transfer training, education on current functional status and orientation to situation as well as NMR for dynamic standing balance, multidirectional stability, and attention to R side. Pt completes bed mobility with supervision, completes stand step transfer with CGA. Pt transported to day room. Pt completes gait 28' without device with CGA/min assist, pt demonstrating NBOS and occasional scissoring L path deviation and decreased attention to R requiring multiple cues for attendance to R side. Pt then completes NMR ambulating around x4 cones two trials, requires visual demonstration prior to each trial however pt able to complete with minimal cues, demonstrating scissoring and requires min assist for task, improved sequencing. Pt completes forward/backward gait with CGA/min assist, continues to demo NBOS for task. Pt requesting to return to room to use restroom, completes ambulatory transfer to toilet with CGA/min assist. Pt demonstrating smear of BM in underwear, otherwise continent of bladder, charted. Pt requires mod assist for changing underwear and pants while seated for threading pants with sequencing. Pt completes hand hygiene with CGA then completes ambulatory transfer back to bed CGA no device and returns to supine. Pt remains semi reclined with all needs within reach, call light  in place, bed alarm activated at end of session.   Therapy Documentation Precautions:  Precautions Precautions: Fall Recall of Precautions/Restrictions: Impaired Precaution/Restrictions Comments: Aphasia Restrictions Weight Bearing Restrictions Per Provider Order: No   Therapy/Group: Individual Therapy  Reche Ohara PT, DPT 08/02/2024, 4:47 PM

## 2024-08-02 NOTE — Plan of Care (Signed)
  Problem: RH SAFETY Goal: RH STG ADHERE TO SAFETY PRECAUTIONS W/ASSISTANCE/DEVICE Description: STG Adhere to Safety Precautions With cues Assistance/Device. 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

## 2024-08-02 NOTE — Progress Notes (Signed)
 Occupational Therapy Session Note  Patient Details  Name: Kynzi Levay MRN: [id number] Date of Birth: [birth year]  Today's Date: 08/02/2024 OT Missed Time: 45 Minutes Missed Time Reason: Patient unwilling/refused to participate without medical reason  Today's Date: 08/02/2024 OT Missed Time: 45 Minutes Missed Time Reason: Nursing care (Blood)  Short Term Goals: Week 1:  OT Short Term Goal 1 (Week 1): STGs=LTGs due to patient's estimated length of stay.  Skilled Therapeutic Interventions/Progress Updates:   Session 1: Pt greeted resting in bed, increased encouragement provided for attempt at participation in session. Pt able to verbalize I dont feel good and I'm not doing anything myself as prompted by OT to proceed with OOB activity. Pt missing ~45 mins of skilled intervention to be made up as appropriate.   Session 2: Pt greeted resting in bed, receiving blood. Pt missing 45 mins of skilled intervention to be made up as appropriate.   Therapy Documentation Precautions:  Precautions Precautions: Fall Recall of Precautions/Restrictions: Impaired Precaution/Restrictions Comments: Aphasia Restrictions Weight Bearing Restrictions Per Provider Order: No   Therapy/Group: Individual Therapy  Nereida Habermann, OTR/L, MSOT  08/02/2024, 7:55 AM

## 2024-08-02 NOTE — Progress Notes (Signed)
 [facility] RN called regarding CT results: She reports patient is resting and no distress noted. Dr Emeline stated yesterday she will be calling patient family in The morning to discuss results. We will continue to monitor and assess. She verbalizes understanding. Heme/ Onc will be consulted, spoke with charge nurse

## 2024-08-02 NOTE — Consult Note (Addendum)
 [facility] CONSULT NOTE  Patient Care Team: Benjamine Aland, MD as PCP - General (Family Medicine) Arvid Collar, FNP (Family Medicine)  CHIEF COMPLAINTS/PURPOSE OF CONSULTATION:  Suspicion for mets gastric ca [facility] recommendations in setting of DVT/PE/recent stroke  REFERRING PHYSICIAN: Dr. Emeline  HISTORY OF PRESENTING ILLNESS:  Tiffany [name] 83 y.o. female who was admitted on 07/23/24 with complaints of confusion.  Apparently patient's daughter came home to find daughter confused and not following commands.  She was brought to the hospital for further evaluation. She was diagnosed with acute stroke. Subsequent imaging has shown multiple hepatic masses concerning for gastric cancer.  She has also been diagnosed with PE and DVT.  Therefore Oncology evaluation is being requested. Patient seen, assessed and examined today.  She is awake and alert laying in bed.  IV heparin infusing well.  Nods her heads to questions and responds with yes or no.  Very minimal verbal responses. Denies abdominal pain or headaches.  Medical history includes hypertension.  Family history noncontributory. Social history is noncontributory.  Denies tobacco use.  Denies alcohol use.  Denies illicit or recreational drug use.  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  Patient nodded her head yes when asked that question.  Also she used to live in [place], moved to [place] during the pandemic.     I have reviewed her chart and materials related to her cancer extensively and collaborated history with the patient. Summary of oncologic history is as follows: Oncology History   No history exists.    ASSESSMENT & PLAN:  Hepatic masses, peritoneal mets. Suspicion for Gastric Ca --Imaging done 08/01/2024 shows multiple hepatic masses consistent with mets.  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  Peritoneal mets in LUQ noted with small pelvic ascites. -- Tumor markers; CEA and  CA19-9 pending, will follow results.  --Patient will need pathology/cytology to confirm malignancy and recommend further oncologic therapy. -- Medical Oncology/Dr. Lanny will make further evaluation and treatment recommendations.  PE, RUL and RLL Acute DVT, RLE -- RLE DVT seen on duplex 9/9. She was started on Eliquis , last given 08/01/24 -- Now on IV heparin, continue per protocol. Pharmacy team monitoring. -- Consideration for IVC filter -- Monitor closely for bleeding  Embolic stroke Aphasia - Newly diagnosed.   -- Recent large left infarct and multiple smaller infarcts.  - Neuro, Neuropsych, PT/OT Teams following  Generalized pain - Patient previously complained of pain in her head and abdomen. Today during exam, patient denied pain.  - Likely secondary to malignancy - Continue supportive care  Anemia -- Hemoglobin low 7.0. Agree with PRBC transfusion. - Likely due to malignancy. -- Recommend PRBC transfusion for HGB <7.0 -- Seen by GI.  EGD planned.  -- Continue to  monitor CBC with differential  Thrombocytosis -- Likely reactive -- Platelets elevated 473K -- Continue to monitor CBC with differential    MEDICAL HISTORY:  Past Medical History:  Diagnosis Date   Hypertension     SURGICAL HISTORY: History reviewed. No pertinent surgical history.  SOCIAL HISTORY: Social History   Socioeconomic History   Marital status: Divorced    Spouse name: Not on file   Number of children: Not on file   Years of education: Not on file   Highest education level: Not on file  Occupational History   Not on file  Tobacco Use   Smoking status: Never    Passive exposure: Never   Smokeless tobacco: Never  Vaping Use  Vaping status: Never Used  Substance and Sexual Activity   Alcohol use: Yes   Drug use: Never   Sexual activity: Not on file  Other Topics Concern   Not on file  Social History Narrative   ** Merged History Encounter **       Social Drivers of Health    Financial Resource Strain: Not on file  Food Insecurity: Unknown (10/18/2023)   Hunger Vital Sign    Worried About Running Out of Food in the Last Year: Never true    Ran Out of Food in the Last Year: Patient declined  Transportation Needs: Patient Declined (10/18/2023)   PRAPARE - Administrator, Civil Service (Medical): Patient declined    Lack of Transportation (Non-Medical): Patient declined  Physical Activity: Not on file  Stress: Not on file  Social Connections: Patient Unable To Answer (07/23/2024)   Social Connection and Isolation Panel    Frequency of Communication with Friends and Family: Patient unable to answer    Frequency of Social Gatherings with Friends and Family: Patient unable to answer    Attends Religious Services: Patient unable to answer    Active Member of Clubs or Organizations: Patient unable to answer    Attends Banker Meetings: Patient unable to answer    Marital Status: Patient unable to answer  Intimate Partner Violence: Patient Declined (10/18/2023)   Humiliation, Afraid, Rape, and Kick questionnaire    Fear of Current or Ex-Partner: Patient declined    Emotionally Abused: Patient declined    Physically Abused: Patient declined    Sexually Abused: Patient declined    FAMILY HISTORY: Family History  Problem Relation Age of Onset   Breast cancer Sister      PHYSICAL EXAMINATION: ECOG PERFORMANCE STATUS: 3 - Symptomatic, >50% confined to bed  Vitals:   08/02/24 1209 08/02/24 1224  BP: 107/61 (!) 101/59  Pulse: 89 81  Resp: 18 16  Temp: 98.9 F (37.2 C) 99 F (37.2 C)  SpO2: 98%    Filed Weights   07/31/24 1530  Weight: 122 lb 2.2 oz (55.4 kg)    GENERAL: alert, no distress and comfortable SKIN: skin color, texture, turgor are normal, no rashes or significant lesions EYES: normal, conjunctiva are pink and non-injected, sclera clear OROPHARYNX: no exudate, no erythema and lips, buccal mucosa, and tongue  normal  NECK: supple, thyroid normal size, non-tender, without nodularity LYMPH: no palpable lymphadenopathy in the cervical, axillary or inguinal LUNGS: clear to auscultation and percussion with normal breathing effort HEART: regular rate & rhythm and no murmurs and no lower extremity edema ABDOMEN: abdomen soft, non-tender and normal bowel sounds MUSCULOSKELETAL: no cyanosis of digits and no clubbing  PSYCH: alert +altered mentation NEURO: no focal motor/sensory deficits   ALLERGIES:  is allergic to elemental sulfur, shellfish allergy, shellfish allergy, and sulfa antibiotics.  MEDICATIONS:  Current Facility-Administered Medications  Medication Dose Route Frequency Provider Last Rate Last Admin   0.9 %  sodium chloride  infusion   Intravenous Continuous Beather Delon Gibson, [place] 10 mL/hr at 08/02/24 1209 New Bag at 08/02/24 1209   acetaminophen  (TYLENOL ) tablet 1,000 mg  1,000 mg Oral TID Engler, Morgan C, DO   1,000 mg at 08/02/24 1347   alum & mag hydroxide-simeth (MAALOX/MYLANTA) 200-200-20 MG/5ML suspension 30 mL  30 mL Oral Q4H PRN Love, Pamela S, PA-C       atorvastatin  (LIPITOR) tablet 20 mg  20 mg Oral Daily Love, Pamela S, PA-C   20  mg at 08/02/24 9081   bisacodyl (DULCOLAX) suppository 10 mg  10 mg Rectal Daily PRN Maurice Sharlet RAMAN, PA-C       diphenhydrAMINE (BENADRYL) capsule 25 mg  25 mg Oral Q6H PRN Love, Pamela S, PA-C       dorzolamide  (TRUSOPT ) 2 % ophthalmic solution 1 drop  1 drop Both Eyes TID Love, Pamela S, PA-C   1 drop at 08/02/24 1348   Fe Fum-Vit C-Vit B12-FA (TRIGELS-F FORTE) capsule 1 capsule  1 capsule Oral Q supper Love, Pamela S, PA-C   1 capsule at 08/01/24 1730   FLUoxetine (PROZAC) capsule 20 mg  20 mg Oral Daily Emeline Search C, DO   20 mg at 08/02/24 9081   guaiFENesin-dextromethorphan (ROBITUSSIN DM) 100-10 MG/5ML syrup 5-10 mL  5-10 mL Oral Q6H PRN Maurice Sharlet RAMAN, PA-C       heparin ADULT infusion 100 units/mL (25000 units/250mL)  600 Units/hr  Intravenous Continuous Emeline Search C, DO 6 mL/hr at 08/02/24 1217 600 Units/hr at 08/02/24 1217   latanoprost  (XALATAN ) 0.005 % ophthalmic solution 1 drop  1 drop Both Eyes QHS Love, Pamela S, PA-C   1 drop at 08/01/24 2040   melatonin tablet 5 mg  5 mg Oral QHS PRN Maurice Sharlet RAMAN, PA-C       metoprolol  tartrate (LOPRESSOR ) tablet 25 mg  25 mg Oral Daily Maurice Sharlet RAMAN, PA-C   25 mg at 08/02/24 9081   mirtazapine  (REMERON ) tablet 7.5 mg  7.5 mg Oral QHS Love, Pamela S, PA-C   7.5 mg at 08/01/24 2039   pantoprazole  (PROTONIX ) EC tablet 40 mg  40 mg Oral BID Maurice Sharlet RAMAN, PA-C   40 mg at 08/02/24 9081   predniSONE  (DELTASONE ) tablet 5 mg  5 mg Oral Daily Maurice Sharlet RAMAN, PA-C   5 mg at 08/02/24 9077   prochlorperazine  (COMPAZINE ) tablet 5-10 mg  5-10 mg Oral Q6H PRN Maurice Sharlet S, PA-C       Or   prochlorperazine  (COMPAZINE ) suppository 12.5 mg  12.5 mg Rectal Q6H PRN Love, Pamela S, PA-C       Or   prochlorperazine  (COMPAZINE ) injection 5-10 mg  5-10 mg Intravenous Q6H PRN Love, Pamela S, PA-C       sodium phosphate (FLEET) enema 1 enema  1 enema Rectal Once PRN Maurice Sharlet RAMAN, PA-C         LABORATORY DATA:  I have reviewed the data as listed Lab Results  Component Value Date   WBC 14.1 (H) 08/02/2024   HGB 7.0 (L) 08/02/2024   HCT 22.8 (L) 08/02/2024   MCV 78.6 (L) 08/02/2024   PLT 473 (H) 08/02/2024   Recent Labs    07/22/24 2221 07/22/24 2303 07/24/24 0217 07/25/24 0434 07/25/24 1432 07/30/24 0454 08/01/24 0527  NA 138   < > 138   < > 140 138 139  K 3.6   < > 3.8   < > 3.7 3.7 3.7  CL 105   < > 105   < > 106 106 105  CO2 18*  --  20*   < > 21* 19* 21*  GLUCOSE 98   < > 93   < > 110* 102* 91  BUN 15   < > 15   < > 13 23 18   CREATININE 0.83   < > 0.88   < > 0.87 0.90 0.85  CALCIUM  8.7*  --  8.2*   < > 8.8* 8.6* 8.6*  GFRNONAA >  60  --  >60   < > >60 >60 >60  PROT 7.8  --  6.5  --   --   --  6.2*  ALBUMIN 2.7*  --  2.3*  --   --   --  2.1*  AST 25  --  23  --   --    --  37  ALT 16  --  15  --   --   --  28  ALKPHOS 149*  --  141*  --   --   --  171*  BILITOT 0.9  --  1.2  --   --   --  0.5   < > = values in this interval not displayed.    RADIOGRAPHIC STUDIES: I have personally reviewed the radiological images as listed and agreed with the findings in the report. CT ABDOMEN PELVIS W CONTRAST Result Date: 08/02/2024 CLINICAL DATA:  The patient had a CTA chest earlier today for pulmonary embolism. A mass was noted in the central liver as well as evidence of peritoneal metastases in the left upper abdomen. EXAM: CT ABDOMEN AND PELVIS WITH CONTRAST TECHNIQUE: Multidetector CT imaging of the abdomen and pelvis was performed using the standard protocol following bolus administration of intravenous contrast. RADIATION DOSE REDUCTION: This exam was performed according to the departmental dose-optimization program which includes automated exposure control, adjustment of the mA and/or kV according to patient size and/or use of iterative reconstruction technique. CONTRAST:  60mL OMNIPAQUE  IOHEXOL  350 MG/ML SOLN COMPARISON:  Ultrasound complete abdomen 01/12/2019 was negative except for a 5 cm left renal cyst. The only other cross-sectional comparison is today's CTA chest. FINDINGS: Lower chest: There is mild cardiomegaly, minimal pericardial effusion. Scattered three-vessel coronary calcifications. Lung bases are clear apart from posterior atelectatic change in the lower lobes. Hepatobiliary: There are multiple hepatic masses consistent with metastases. The most likely primary is gastric given the observation of a circumferentially thickened mid to distal stomach and gastrohepatic ligament adenopathy. The largest mass appears to be a coalescence of at least 3 masses involving portions of segments 5 and 8. This measures 7.7 x 6.6 cm on 3:18. There is a segment 4 mass measuring 2.8 x 3.4 cm on 3:25, and a segment 1 mass significantly narrowing the intrahepatic IVC measuring 4.3 x  4.1 cm on 3:17. Out laterally in segment 2 there is another mass measuring 5.4 x 3.3 cm on 3:17. There are few additional scattered small masses in both lobes. The gallbladder and bile ducts are unremarkable. Pancreas: No abnormality Spleen: No abnormality. Adrenals/Urinary Tract: There is no adrenal mass. In the left upper pole there is a 4.1 cm homogeneous Bosniak 2 cyst, Hounsfield density is 22.2. A second Bosniak 2 cyst just below this measures 1.2 cm and 23 Hounsfield units, and on the right there is a third parapelvic Bosniak 2 cyst in the inferior pole measuring 1.9 cm on 23 Hounsfield units. No follow-up imaging is recommended. There is no discrete renal mass enhancement. There is contrast in the collecting systems and ureters which would obscure stones but there is no hydronephrosis. The bladder wall and lumen are unremarkable. There is dense contrast opacifying the bladder. Stomach/Bowel: Circumferentially thickened wall in the mid distal stomach particularly posteriorly and inferiorly, suspicious for infiltrating disease such as carcinoma. The small bowel is normal caliber. The appendix is normal. There is moderate retained stool. No overt colonic wall thickening. Scattered uncomplicated diverticulosis. Vascular/Lymphatic: Aortic atherosclerosis. No AAA or acute vascular findings. There  is mass effect on the intrahepatic IVC due to a segment 1 liver metastasis described above. No thrombus is seen within it. The portal vein and branches are widely patent. There is an enlarged necrotic gastrohepatic ligament lymph measuring 2.4 x 2.2 cm on 3:21. There are pathologic lymph nodes inferior to the stomach as well, ranging from 9 mm short axis up to the largest right of the midline measuring 2.4 x 1.6 cm on 3:38. Additional evidence of peritoneal metastatic disease with left upper quadrant nodular peritoneal metastases measuring up to 1.6 x 1.5 cm on 3:26. No adenopathy is seen in the pelvis. Reproductive:  Status post hysterectomy. No adnexal masses. Other: Small amount of pelvic ascites. No drainable pocket. No free air or free hemorrhage. Small umbilical fat hernia. Musculoskeletal: No regional bone metastasis is seen. Mild degenerative change thoracic and lumbar spine. IMPRESSION: 1. Multiple hepatic masses consistent with metastases. The most likely primary is gastric given the circumferentially thickened mid to distal stomach, and hypogastric and gastrohepatic ligament adenopathy. 2. 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 3. Small amount of pelvic ascites. 4. Aortic and coronary artery atherosclerosis. 5. Cardiomegaly with minimal pericardial effusion. 6. Umbilical fat hernia. 7. These results will be called to the ordering clinician or representative by the Radiologist Assistant, and communication documented in the PACS or [employer]. Aortic Atherosclerosis (ICD10-I70.0). Electronically Signed   By: Francis Quam M.D.   On: 08/02/2024 00:35   CT Angio Chest Pulmonary Embolism (PE) W or WO Contrast Result Date: 08/01/2024 CLINICAL DATA:  Fever tachycardia EXAM: CT ANGIOGRAPHY CHEST WITH CONTRAST TECHNIQUE: Multidetector CT imaging of the chest was performed using the standard protocol during bolus administration of intravenous contrast. Multiplanar CT image reconstructions and MIPs were obtained to evaluate the vascular anatomy. RADIATION DOSE REDUCTION: This exam was performed according to the departmental dose-optimization program which includes automated exposure control, adjustment of the mA and/or kV according to patient size and/or use of iterative reconstruction technique. CONTRAST:  60mL OMNIPAQUE  IOHEXOL  350 MG/ML SOLN COMPARISON:  Chest x-ray 10/17/2023 FINDINGS: Cardiovascular: Satisfactory opacification of the pulmonary arteries to the segmental level. Small emboli within right upper lobe subsegmental branches. Small volume right lower lobe segmental and subsegmental PE. Mild  aortic atherosclerosis. No aneurysm. Mild coronary vascular calcification. Borderline cardiomegaly. No pericardial effusion Mediastinum/Nodes: Patent trachea. No thyroid mass. No suspicious lymph nodes. Esophagus within normal limits except for small hiatal hernia Lungs/Pleura: Mild dependent atelectasis. No consolidation or pleural effusion. Punctate 2 mm lingular pulmonary nodule, no specific imaging follow-up is recommended. Upper Abdomen: Incompletely visualized is vague hypodensity in the central liver, series 5 image 114. Soft tissue nodularity within the left upper quadrant incompletely assessed, series 5 image 91 through 114. Musculoskeletal: No acute osseous abnormality. 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 IMPRESSION: 1. Small volume right upper lobe and right lower lobe segmental and subsegmental pulmonary emboli. 2. Incompletely visualized vague hypodensity in the central liver, raising concern for possible liver lesion/mass. In addition, there is incompletely visualized nodularity within the left upper quadrant as could be seen with peritoneal metastatic disease. Recommend dedicated abdominopelvic contrast-enhanced CT. 3. Critical Value/emergent results were called by telephone at the time of interpretation on 08/01/2024 at 8:52 pm to provider NP Debby , who verbally acknowledged these results. Aortic Atherosclerosis (ICD10-I70.0). Electronically Signed   By: Luke Bun M.D.   On: 08/01/2024 20:53  VAS US  LOWER EXTREMITY VENOUS (DVT) Result Date: 07/25/2024  Lower Venous DVT Study Patient Name:  GILBERTA PEETERS  Date of Exam:   07/25/2024 Medical Rec #: [id number]         Accession #:    [id number] Date of Birth: [birth year]          Patient Gender: F Patient Age:   19 years Exam Location:  [facility] Procedure:      VAS US  LOWER EXTREMITY VENOUS (DVT) Referring  Phys: HARLENE BOWL --------------------------------------------------------------------------------  Indications: Superficial venous thrombosis (SVT) I82.819.  Risk Factors: None identified. Comparison Study: No prior studies. Performing Technologist: Cordella Collet RVT  Examination Guidelines: A complete evaluation includes B-mode imaging, spectral Doppler, color Doppler, and power Doppler as needed of all accessible portions of each vessel. Bilateral testing is considered an integral part of a complete examination. Limited examinations for reoccurring indications may be performed as noted. The reflux portion of the exam is performed with the patient in reverse Trendelenburg.  +---------+---------------+---------+-----------+----------+--------------+ RIGHT    CompressibilityPhasicitySpontaneityPropertiesThrombus Aging +---------+---------------+---------+-----------+----------+--------------+ CFV      Full           Yes      Yes                                 +---------+---------------+---------+-----------+----------+--------------+ SFJ      Full                                                        +---------+---------------+---------+-----------+----------+--------------+ FV Prox  Full                                                        +---------+---------------+---------+-----------+----------+--------------+ FV Mid   Full                                                        +---------+---------------+---------+-----------+----------+--------------+ FV DistalFull                                                        +---------+---------------+---------+-----------+----------+--------------+ PFV      Full                                                        +---------+---------------+---------+-----------+----------+--------------+ POP      None           No       No                   Acute           +---------+---------------+---------+-----------+----------+--------------+ PTV  Partial                                      Acute          +---------+---------------+---------+-----------+----------+--------------+ PERO     None                                         Acute          +---------+---------------+---------+-----------+----------+--------------+ Gastroc  Full                                                        +---------+---------------+---------+-----------+----------+--------------+   +----+---------------+---------+-----------+----------+--------------+ LEFTCompressibilityPhasicitySpontaneityPropertiesThrombus Aging +----+---------------+---------+-----------+----------+--------------+ CFV Full           Yes      Yes                                 +----+---------------+---------+-----------+----------+--------------+    Summary: RIGHT: - Findings consistent with acute deep vein thrombosis involving the right popliteal vein, right posterior tibial veins, and right peroneal veins.  - No cystic structure found in the popliteal fossa.  LEFT: - No evidence of common femoral vein obstruction.   *See table(s) above for measurements and observations. Electronically signed by Debby Robertson on 07/25/2024 at 5:14:24 PM.    Final    ECHOCARDIOGRAM COMPLETE Result Date: 07/24/2024    ECHOCARDIOGRAM REPORT   Patient Name:   Abbigaile Rockman Date of Exam: 07/24/2024 Medical Rec #:  [id number]        Height:       60.0 in Accession #:    [id number]       Weight:       130.0 lb Date of Birth:  [birth year]         BSA:          1.554 m Patient Age:    83 years         BP:           122/67 mmHg Patient Gender: F                HR:           96 bpm. Exam Location:  Inpatient Procedure: 2D Echo, Cardiac Doppler and Color Doppler (Both Spectral and Color            Flow Doppler were utilized during procedure). Indications:   Abnormal ECG R94.31  History:       Patient has no prior  history of Echocardiogram examinations.                Stroke.  Sonographer:   Thea Norlander RCS Referring      [facility] SAMTANI Phys: IMPRESSIONS  1. Left ventricular ejection fraction, by estimation, is 60 to 65%. The left ventricle has normal function. The left ventricle has no regional wall motion abnormalities. Left ventricular diastolic parameters were normal.  2. Right ventricular systolic function is normal. The right ventricular size is normal.  3. The mitral valve is normal in structure. No evidence of mitral valve regurgitation. No evidence of mitral stenosis.  4. The aortic valve is abnormal. There is mild thickening of the aortic valve with a small subcentimeter echodensity at the LV side of the valve. There is mild aortic valve regurgitation. Consider TEE for further evaluation in setting of recent CVA.  5. The inferior vena cava is normal in size with greater than 50% respiratory variability, suggesting right atrial pressure of 3 mmHg. FINDINGS  Left Ventricle: Left ventricular ejection fraction, by estimation, is 60 to 65%. The left ventricle has normal function. The left ventricle has no regional wall motion abnormalities. The left ventricular internal cavity size was normal in size. There is  no left ventricular hypertrophy. Left ventricular diastolic parameters were normal. Right Ventricle: The right ventricular size is normal. No increase in right ventricular wall thickness. Right ventricular systolic function is normal. Left Atrium: Left atrial size was normal in size. Right Atrium: Right atrial size was normal in size. Pericardium: There is no evidence of pericardial effusion. Mitral Valve: The mitral valve is normal in structure. No evidence of mitral valve regurgitation. No evidence of mitral valve stenosis. Tricuspid Valve: The tricuspid valve is normal in structure. Tricuspid valve regurgitation is not demonstrated. No evidence of tricuspid stenosis. Aortic Valve: The aortic valve is  abnormal. There is mild thickening of the aortic valve. Aortic valve regurgitation is mild. No aortic stenosis is present. Aortic valve peak gradient measures 10.4 mmHg. Pulmonic Valve: The pulmonic valve was normal in structure. Pulmonic valve regurgitation is not visualized. No evidence of pulmonic stenosis. Aorta: The aortic root is normal in size and structure. Venous: The inferior vena cava is normal in size with greater than 50% respiratory variability, suggesting right atrial pressure of 3 mmHg. IAS/Shunts: No atrial level shunt detected by color flow Doppler.  LEFT VENTRICLE PLAX 2D LVIDd:         4.20 cm   Diastology LVIDs:         2.50 cm   LV e' medial:    9.46 cm/s LV PW:         0.90 cm   LV E/e' medial:  6.6 LV IVS:        0.90 cm   LV e' lateral:   14.70 cm/s LVOT diam:     2.00 cm   LV E/e' lateral: 4.2 LV SV:         51 LV SV Index:   33 LVOT Area:     3.14 cm  RIGHT VENTRICLE             IVC RV S prime:     13.80 cm/s  IVC diam: 1.40 cm TAPSE (M-mode): 1.9 cm LEFT ATRIUM             Index        RIGHT ATRIUM           Index LA diam:        4.30 cm 2.77 cm/m   RA Area:     10.50 cm LA Vol (A2C):   39.8 ml 25.61 ml/m  RA Volume:   18.63 ml  11.99 ml/m LA Vol (A4C):   26.8 ml 17.24 ml/m LA Biplane Vol: 30.1 ml 19.37 ml/m  AORTIC VALVE AV Area (Vmax): 1.92 cm AV Vmax:        161.00 cm/s AV Peak Grad:   10.4 mmHg LVOT Vmax:      98.50 cm/s LVOT Vmean:     65.200 cm/s LVOT VTI:       0.163 m  AORTA  Ao Root diam: 3.00 cm Ao Asc diam:  3.60 cm MITRAL VALVE MV Area (PHT): 5.31 cm    SHUNTS MV Decel Time: 143 msec    Systemic VTI:  0.16 m MV E velocity: 62.40 cm/s  Systemic Diam: 2.00 cm MV A velocity: 96.40 cm/s MV E/A ratio:  0.65 Aditya Sabharwal Electronically signed by Ria Commander Signature Date/Time: 07/24/2024/3:45:09 PM    Final    MR BRAIN WO CONTRAST Result Date: 07/24/2024 CLINICAL DATA:  Provided history: Stroke, follow-up. EXAM: MRI HEAD WITHOUT CONTRAST TECHNIQUE: Multiplanar,  multiecho pulse sequences of the brain and surrounding structures were obtained without intravenous contrast. COMPARISON:  Non-contrast head CT and CT angiogram head/neck 07/23/2024. FINDINGS: Intermittently motion degraded examination. Most notably, the axial T1 sequence is moderate to severely motion degraded and the coronal T2 sequence is severely motion degraded. Within this limitation, findings are swallows. Brain: Generalized cerebral atrophy. Large acute left MCA territory infarct affecting the posterior insula, posterior frontal lobe, parietal lobe and occipital lobe. This infarct does not appear significantly changed in extent as compared to yesterday's head CT. No significant mass effect at this time. No evidence of hemorrhagic conversion. Tiny acute cortical infarct within the perirolandic right frontoparietal region. Tiny acute infarct within the right parietal white matter (series 2, image 36). Mild multifocal T2 FLAIR hyperintense signal abnormality elsewhere within the cerebral white matter and pons, nonspecific but compatible chronic small ischemic disease. Redemonstrated chronic microhemorrhage within the left occipital lobe. Tiny acute infarcts within the bilateral cerebellar hemispheres. probable small (2-3 mm) meningioma along the right petrous apex better appreciated on the prior brain MRI of 12/28/2021 (series 6, image 8 of the current exam). No extra-axial fluid collection. No midline shift. Vascular: Please see the CTA head/neck performed yesterday. Skull and upper cervical spine: No focal race marrow lesion. Sinuses/Orbits: No mass or acute finding within the imaged orbits. Prior bilateral ocular lens replacement. No significant paranasal sinus disease. IMPRESSION: 1. Intermittently motion degraded examination as described. Within this limitation, findings are as follows. 2. Large acute left MCA territory infarct, as described and not significantly changed in extent since yesterday's head  CT. No significant mass effect at this time. No evidence of hemorrhagic conversion. 3. Additional tiny acute infarcts within the perirolandic right frontoparietal cortex, right parietal white matter and bilateral cerebellar hemispheres. 4. Background parenchymal atrophy and chronic small vessel ischemic disease. 5. 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 Electronically Signed   By: Rockey Childs D.O.   On: 07/24/2024 13:13   CT ANGIO HEAD NECK W WO CM Result Date: 07/23/2024 CLINICAL DATA:  83 year old female with confusion, abnormal speech. Recently started on Eliquis . Severe anemia. Cytotoxic edema in the posterior left hemisphere on head CT last night. EXAM: CT ANGIOGRAPHY HEAD AND NECK WITH AND WITHOUT CONTRAST TECHNIQUE: Multidetector CT imaging of the head and neck was performed using the standard protocol during bolus administration of intravenous contrast. Multiplanar CT image reconstructions and MIPs were obtained to evaluate the vascular anatomy. Carotid stenosis measurements (when applicable) are obtained utilizing NASCET criteria, using the distal internal carotid diameter as the denominator. RADIATION DOSE REDUCTION: This exam was performed according to the departmental dose-optimization program which includes automated exposure control, adjustment of the mA and/or kV according to patient size and/or use of iterative reconstruction technique. CONTRAST:  OMNIPAQUE  IOHEXOL  350 MG/ML SOLN COMPARISON:  Head CT 2302 hours yesterday. FINDINGS: CT HEAD Brain: Confluent cytotoxic edema posterior  left posterior MCA versus MCA/PCA watershed area appears stable and size and extent since last night. No associated mass effect. No hemorrhagic transformation. Stable gray-white matter differentiation throughout the brain. No intracranial mass effect or midline shift. Stable nonspecific ventriculomegaly. Basilar cisterns remain normal.  Calvarium and skull base: Intact. No acute osseous abnormality identified. Paranasal sinuses: Visualized paranasal sinuses and mastoids are stable and well aerated. Orbits: No gaze deviation.  Stable orbit and scalp soft tissues. CTA NECK Skeleton: Levoconvex cervical scoliosis. No acute osseous abnormality identified. Incidental benign appearing right subscapularis intramuscular lipoma (series 5, image 303). Upper chest: Negative. Other neck: Nonvascular neck soft tissue spaces appear negative. Aortic arch: Mild arch tortuosity and calcified plaque. Bovine arch configuration. Right carotid system: Tortuous brachiocephalic artery and right CCA origin without plaque or stenosis. Negative right carotid bifurcation. No stenosis. Left carotid system: Bovine left CCA origin with tortuosity. Mild calcified plaque at the posterior left ICA origin. Mildly tortuous left ICA without stenosis. Vertebral arteries: Tortuous proximal right subclavian artery without plaque or stenosis. Minimal calcified plaque at the right vertebral artery origin without stenosis. Tortuous right vertebral artery is patent to the neck with no other plaque or stenosis. Proximal left subclavian artery atherosclerosis and tortuosity, less than 50 % stenosis with respect to the distal vessel. Normal left vertebral artery origin. Tortuous left V1 segment. Codominant left vertebral artery is tortuous but patent without stenosis to the skull base. CTA HEAD Posterior circulation: Codominant distal vertebral arteries and vertebrobasilar junction are patent. Distal left V4 calcified plaque with only mild stenosis. Bilateral AICA appear dominant and patent. Patent basilar artery with mild mid basilar irregularity. No significant basilar stenosis. Patent SCA and fetal type bilateral PCA origins. Bilateral PCA branches are tortuous with mild irregularity. No significant PCA stenosis, mild bilateral P2 segment irregularity and stenosis (series 22, image 27 on  the right). Anterior circulation: Both ICA siphons are patent. Left siphon mild ectasia and calcified plaque without stenosis. Normal left posterior communicating artery origin. Similar right siphon ectasia and mild calcified plaque without stenosis. Normal right posterior communicating artery origin. Patent carotid termini. Normal MCA and ACA origins. Diminutive anterior communicating artery. Bilateral ACA branches are within normal limits. Right MCA M1 segment is tortuous, right MCA trifurcation is patent without stenosis. Right MCA branches are within normal limits. Left MCA M1 segment bifurcates early without stenosis. There is a posterior left MCA branch occlusion, appears to be M3 or M4 on series 22, image 37. Other left MCA branches are within normal limits. Venous sinuses: Patent. Anatomic variants: Fetal type PCA origins. Bovine arch configuration. Review of the MIP images confirms the above findings IMPRESSION: 1. Negative for large vessel occlusion. 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 2. 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 No hemorrhagic transformation or intracranial mass effect. 3. Generalized arterial tortuosity in the head and neck with mild for age atherosclerosis. No other significant stenosis. 4.  Aortic Atherosclerosis (ICD10-I70.0). Electronically Signed   By: VEAR Hurst M.D.   On: 07/23/2024 09:30   CT HEAD WO CONTRAST Result Date: 07/22/2024 CLINICAL DATA:  Memory loss and altered mental status. EXAM: CT HEAD WITHOUT CONTRAST TECHNIQUE: Contiguous axial images were obtained from the base of the skull through the vertex without intravenous contrast. RADIATION DOSE REDUCTION: This exam was performed according to the departmental dose-optimization program which includes automated exposure control, adjustment of the mA and/or kV according to patient  size and/or use of iterative reconstruction technique.  COMPARISON:  Head CT 04/28/2024. FINDINGS: Brain: There is an acute/early subacute left occipitotemporal nonhemorrhagic infarct involving the posterior temporal lobe from the posterior sylvian cortex, extending contiguously posteriorly to the lateral occipital lobe over an area measuring 7.1 cm AP, 3.0 cm coronal, 3.3 cm in height. There is hyperdensity of the infarcting tissue, loss of sulci and loss of gray-white matter differentiation. The infarct extends from the cortical surface to the ventricular interface, without hemorrhage or downward mass effect or notable effacement of the posterior horn of the left ventricle. There is no further evidence of infarcts. There is mild cerebral atrophy and small-vessel disease and mild-to-moderate atrophic ventriculomegaly which was seen previously. Cerebellum and brainstem are unremarkable. No midline shift. Basal cisterns are clear. Vascular: Despite the findings I do not see a hyperdense vessel. There are calcific plaques both siphons, distal left vertebral artery. Skull: Negative for fractures. There is patchy sclerosis and thickening of the squamous portion of the left temporal bone. In retrospect this was present on the prior CT and it seems to have been present on MRI from 12/28/2021. This could represent pagetoid changes of the bone or changes due to fibrous dysplasia, but a bone metastasis is not strictly excluded. Rest of the calvarium is unremarkable. Sinuses/Orbits: No acute finding. Old lens replacements. Clear sinuses and mastoids. Other: None. IMPRESSION: 1. 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 There sulcal effacement but no significant mass effect. 2. Atrophy and small-vessel disease.  Otherwise stable exam. 3. Patchy sclerosis and thickening of the squamous portion of the left temporal bone. No change since the 04/28/2024 CT, and appears to have been present on a 12/28/2021 MRI brain although not as well depicted. This could  represent pagetoid changes of the bone or changes due to fibrous dysplasia, but a bone metastasis is not strictly excluded. 4. PRA is attempting to reach the ordering physician for stat notification at the time of signing. Electronically Signed   By: Francis Quam M.D.   On: 07/22/2024 23:32   US  Venous Img Lower Unilateral Right (DVT) Result Date: 07/11/2024 CLINICAL DATA:  Right lower extremity redness, pain and swelling. EXAM: Right LOWER EXTREMITY VENOUS DOPPLER ULTRASOUND TECHNIQUE: Gray-scale sonography with graded compression, as well as color Doppler and duplex ultrasound were performed to evaluate the lower extremity deep venous systems from the level of the common femoral vein and including the common femoral, femoral, profunda femoral, popliteal and calf veins including the posterior tibial, peroneal and gastrocnemius veins when visible. The superficial great saphenous vein was also interrogated. Spectral Doppler was utilized to evaluate flow at rest and with distal augmentation maneuvers in the common femoral, femoral and popliteal veins. COMPARISON:  None Available. FINDINGS: Contralateral Common Femoral Vein: Respiratory phasicity is normal and symmetric with the symptomatic side. No evidence of thrombus. Normal compressibility. Common Femoral Vein: No evidence of thrombus. Normal compressibility, respiratory phasicity and response to augmentation. Saphenofemoral Junction: No evidence of thrombus. Normal compressibility and flow on color Doppler imaging. Profunda Femoral Vein: No evidence of thrombus. Normal compressibility and flow on color Doppler imaging. Femoral Vein: No evidence of thrombus. Normal compressibility, respiratory phasicity and response to augmentation. Popliteal Vein: There is occlusive thrombus in the right popliteal vein with noncompressibility of the vessel. Calf Veins: There is occlusive thrombus in one of the paired peroneal veins. Superficial Great Saphenous Vein: No  evidence of thrombus. Normal compressibility. Venous Reflux:  None. Other Findings: There is occlusive thrombus in the small  saphenous vein. IMPRESSION: 1. Positive for DVT in the popliteal and peroneal veins. 2. Occlusive thrombus in the small saphenous vein. These results will be called to the ordering clinician or representative by the Radiologist Assistant, and communication documented in the PACS or [employer]. Electronically Signed   By: Vanetta Chou M.D.   On: 07/11/2024 16:53     The total time spent in the appointment was 55 minutes encounter with patients including review of chart and various tests results, discussions about plan of care and coordination of care plan   All questions were answered. The patient knows to call the clinic with any problems, questions or concerns. No barriers to learning was detected.  Olam PARAS Rouson, NP 10/1/20252:51 PM    Addendum I have seen the patient, examined her. I agree with the assessment and and plan and have edited the notes.   Patient is a 83 year old female currently in rehab after her recent stroke.  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  She was independent for the stroke.  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  I discussed tissue biopsy to make diagnosis, I recommend a liver biopsy, which is less invasive than EGD and a biopsy.  Unfortunately, she is not a candidate for chemotherapy due to her stroke and advanced age.  If she does have tissue, we can do molecular testing to see if she is a candidate for immunotherapy or targeted therapy.  We can certainly wait and see how to recover goes from stroke, if she is able to recover well, I will see her back in the office and we can do a biopsy at that time.  It is also very reasonable to consider home hospice after rehab, giving the overall very limited option of treatment  for her cancer.  Her daughter voiced a good understanding, and wants to talk to the rest of family, to make a decision about biopsy.  If no biopsy is pursued, okay to change her heparin drip to oral anticoagulation, such as Eliquis .  I will see her as needed, please give me a call when she is ready to be discharged from rehab, to see if she needs a follow-up appointment with me in the office.  I spent a total of 60 minutes for her visit today.  Onita Mattock MD 08/02/2024

## 2024-08-02 NOTE — Progress Notes (Signed)
 Physical Therapy Note  Patient Details  Name: Jozlynn Plaia MRN: [id number] Date of Birth: 06-06-1941 Today's Date: 08/02/2024   Today's Date: 08/02/2024 PT Missed Time: 60 Minutes Missed Time Reason: Patient fatigue;Patient unwilling to participate (Pt refuses indicating blood transfusion as reason unable to participate and increased fatigue)    Pt refuses participation with scheduled therapy session due to fatigue and indicating blood transfusion as reason unable to participate. Therapist educates on importance of participation and provides comfort measures however pt continues to refuse. Therapist will reattempt as schedule allows. Pt missing 60 minutes of skilled therapy.   Reche Ohara PT, DPT 08/02/2024, 2:32 PM

## 2024-08-02 NOTE — Progress Notes (Signed)
 PHARMACY - ANTICOAGULATION CONSULT NOTE  Pharmacy Consult for Heparin Indication: pulmonary embolus and DVT  Allergies  Allergen Reactions   Elemental Sulfur Hives   Shellfish Allergy Anaphylaxis   Shellfish Allergy    Sulfa Antibiotics Hives    Patient Measurements: Height: 5' 2 (157.5 cm) Weight: 55.4 kg (122 lb 2.2 oz) IBW/kg (Calculated) : 50.1 HEPARIN DW (KG): 55.4  Vital Signs: Temp: 98.3 F (36.8 C) (10/01 2027) Temp Source: Oral (10/01 2027) BP: 112/62 (10/01 2027) Pulse Rate: 73 (10/01 2027)  Labs: Recent Labs    08/01/24 0527 08/02/24 0552 08/02/24 0942 08/02/24 2004  HGB 7.3* 7.1* 7.0*  --   HCT 23.8* 22.5* 22.8*  --   PLT 385  --  473*  --   APTT  --   --  36 40*  HEPARINUNFRC  --   --  >1.10*  --   CREATININE 0.85  --   --   --     Estimated Creatinine Clearance: 40.4 mL/min (by C-G formula based on SCr of 0.85 mg/dL).  Assessment: 40 YOF with recent RLE DVT per duplex 07/11/24 and started on Eliquis . Concern for embolic stroke on 9/21. and CTA on 9/30 with RUL and RLL pulmonary emboli of unknown onset. Concern for Eliquis  failure so pharmacy consulted to change to heparin infusion with no bolus. Last Eliquis  dose 9/30 at 2029. Expect heparin levels to be falsely elevated with recent Eliquis  dosing, so will use aPTTs for monitoring until correlating.  Dicussed with P. Love, PA-C, who d/w Heme/Onc and Neuro and to continue heparin for now. 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 GI consulted for EGD and biopsies, now scheduled for 10/2.  aPTT sub-therapeutic at 40 sec.  No issue with heparin infusion and no overt bleeding per RN.  Goal of Therapy:  Heparin level 0.3-0.5 units/ml aPTT 66-82 seconds Monitor platelets by anticoagulation protocol: Yes   Plan:  Increase heparin drip to 750 units/hr D/C daily heparin level and aPTT since heparin will be off at 0430 on 10/2 for EGD F/U post procedure  Reba Hulett D. Lendell, PharmD,  BCPS, BCCCP 08/02/2024, 9:48 PM

## 2024-08-02 NOTE — Progress Notes (Signed)
 PHARMACY - ANTICOAGULATION CONSULT NOTE  Pharmacy Consult for Heparin Indication: pulmonary embolus and DVT  Allergies  Allergen Reactions   Elemental Sulfur Hives   Shellfish Allergy Anaphylaxis   Shellfish Allergy    Sulfa Antibiotics Hives    Patient Measurements: Height: 5' 2 (157.5 cm) Weight: 55.4 kg (122 lb 2.2 oz) IBW/kg (Calculated) : 50.1 HEPARIN DW (KG): 55.4  Vital Signs: Temp: 100.4 F (38 C) (10/01 1101) Temp Source: Oral (10/01 0527) BP: 104/61 (10/01 1101) Pulse Rate: 84 (10/01 1101)  Labs: Recent Labs    08/01/24 0527 08/02/24 0552 08/02/24 0942  HGB 7.3* 7.1* 7.0*  HCT 23.8* 22.5* 22.8*  PLT 385  --  473*  APTT  --   --  36  HEPARINUNFRC  --   --  >1.10*  CREATININE 0.85  --   --     Estimated Creatinine Clearance: 40.4 mL/min (by C-G formula based on SCr of 0.85 mg/dL).  Assessment: 51 YOF with recent RLE DVT per duplex 07/11/24 and started on Eliquis . Concern for embolic stroke on 9/21. and CTA on 9/30 with RUL and RLL pulmonary emboli of unknown onset. Concern for Eliquis  failure so pharmacy consulted to change to heparin infusion with no bolus. Last Eliquis  dose 9/30 at 2029 so started heparin infusion conservatively with low therapeutic goals, heparin level goal of 0.3-0.5 and aPTT of 66-85 given recent stroke. Expect heparin levels to be falsely elevated with recent Eliquis  dosing, so will use aPTTs for monitoring until correlating.  Initial aPTT is subtherapeutic (36 seconds) and heparin level is falsely elevated (>1.10) on 500 units/hr.  Hemoglobin low 7.3 prior to heparin start and down to 7.1 and 7.0 today. For 1 unit PRBCs.  No infusion issues per RN and no overt bleeding.  Dicussed with P. Love, PA-C, who d/w Heme/Onc and Neuro and to continue heparin for now. 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 GI consulted for EGD and biopsies, now scheduled for 10/2.  Goal of Therapy:  Heparin level 0.3-0.5 units/ml aPTT  66-82 seconds Monitor platelets by anticoagulation protocol: Yes   Plan:  Increase heparin drip to 600 units/hr. aPTT ~8 hrs after rate change. Daily aPTT and heparin level until correlating; daily CBC. Heparin drip to stop 4 hours prior to EGD at 0430 on 10/2. Stop time added. EGD scheduled for 0830 on 10/2. Will follow up post-procedure.  Genaro Zebedee Calin, RPh 08/02/2024,11:42 AM

## 2024-08-03 ENCOUNTER — Encounter (HOSPITAL_COMMUNITY)
Admission: AD | Disposition: A | Discharge status: Home / Self Care | Payer: Self-pay | Source: Intra-hospital | Attending: Physical Medicine and Rehabilitation

## 2024-08-03 ENCOUNTER — Inpatient Hospital Stay (HOSPITAL_COMMUNITY): Admitting: Anesthesiology

## 2024-08-03 ENCOUNTER — Encounter (HOSPITAL_COMMUNITY): Payer: Self-pay | Admitting: Physical Medicine and Rehabilitation

## 2024-08-03 DIAGNOSIS — I63512 Cerebral infarction due to unspecified occlusion or stenosis of left middle cerebral artery: Secondary | ICD-10-CM | POA: Diagnosis not present

## 2024-08-03 LAB — CBC
HCT: 26.7 % — ABNORMAL LOW (ref 36.0–46.0)
Hemoglobin: 8.2 g/dL — ABNORMAL LOW (ref 12.0–15.0)
MCH: 24.8 pg — ABNORMAL LOW (ref 26.0–34.0)
MCHC: 30.7 g/dL (ref 30.0–36.0)
MCV: 80.9 fL (ref 80.0–100.0)
Platelets: 470 K/uL — ABNORMAL HIGH (ref 150–400)
RBC: 3.3 MIL/uL — ABNORMAL LOW (ref 3.87–5.11)
RDW: 18.3 % — ABNORMAL HIGH (ref 11.5–15.5)
WBC: 16.7 K/uL — ABNORMAL HIGH (ref 4.0–10.5)
nRBC: 0 % (ref 0.0–0.2)

## 2024-08-03 LAB — BASIC METABOLIC PANEL WITH GFR
Anion gap: 9 (ref 5–15)
BUN: 15 mg/dL (ref 8–23)
CO2: 21 mmol/L — ABNORMAL LOW (ref 22–32)
Calcium: 8.2 mg/dL — ABNORMAL LOW (ref 8.9–10.3)
Chloride: 108 mmol/L (ref 98–111)
Creatinine, Ser: 0.84 mg/dL (ref 0.44–1.00)
GFR, Estimated: >60 mL/min (ref >60)
Glucose, Bld: 85 mg/dL (ref 70–99)
Potassium: 3.9 mmol/L (ref 3.5–5.1)
Sodium: 138 mmol/L (ref 135–145)

## 2024-08-03 LAB — TYPE AND SCREEN
ABO/RH(D): O POS
Antibody Screen: NEGATIVE
Unit division: 0

## 2024-08-03 LAB — BPAM RBC
Blood Product Expiration Date: 202510302359
ISSUE DATE / TIME: 202510011143
Unit Type and Rh: 5100

## 2024-08-03 SURGERY — CANCELLED PROCEDURE
Anesthesia: Monitor Anesthesia Care

## 2024-08-03 MED ORDER — MIRTAZAPINE 15 MG PO TABS
15.0000 mg | ORAL_TABLET | Freq: Every day | ORAL | Status: DC
  Administered 2024-08-03 – 2024-08-10 (×8): 15 mg via ORAL
  Filled 2024-08-03 (×8): qty 1

## 2024-08-03 MED ORDER — HEPARIN (PORCINE) 25000 UT/250ML-% IV SOLN
750.0000 [IU]/h | INTRAVENOUS | Status: DC
  Administered 2024-08-03: 750 [IU]/h via INTRAVENOUS
  Filled 2024-08-03: qty 250

## 2024-08-03 NOTE — Progress Notes (Signed)
 Patient admitted to endoscopy.  After Dr. San spoke with patient and daughter, daughter decided not to go forward with procedure and patient was cancelled.

## 2024-08-03 NOTE — Consult Note (Signed)
 Chief Complaint: Liver lesions, DVT and PE, anemia requiring transfusion - IR consulted for image guided liver mass biopsy and IVC filter placement  Referring Provider(s): Emeline Joesph BROCKS, DO   Supervising Physician: Jenna Hacker  Patient Status: [facility] - In-pt  History of Present Illness: Tiffany [name] is a 83 y.o. 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 She was also found to be significantly anemic and was transfused 2 units PRBC - believed to be due to chronic disease. Before admission, she was diagnosed with DVT on 07/11/24 and the subsequent stroke is believed to be embolic. Repeat LE US  on 07/25/24 showed acute DVT of right popliteal, peroneal, and posterior tibial veins. Pt's eliquis  was resumed but stopped again after persistently dropping hgb, pt is now on heparin. While in the hospital, pt had tachycardia and CTA chest was obtained 08/01/24 showing small volume right PE, as well as hypodensity in the liver. 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 Persistent anemia and hypercoagulability believed to be due to underlying malignancy. Pt initially was going for EGD for gastric biopsy, but ended up cancelling last minute as family wants to proceed with liver lesion biopsy instead. IR has now been consulted for image guided liver biopsy as well as IVC filter placement given known DVT and limited ability to anticoagulate with anemia.  Case reviewed with IR attending Dr. Jenna with approval to proceed.   Patient is Full Code  Past Medical History:  Diagnosis Date   Hypertension     History reviewed. No pertinent surgical history.  Allergies: Elemental sulfur, Shellfish  allergy, Shellfish allergy, and Sulfa antibiotics  Medications: Prior to Admission medications   Medication Sig Start Date End Date Taking? Authorizing Provider  alendronate (FOSAMAX) 70 MG tablet Take 70 mg by mouth once a week. 09/09/23   [provider]  apixaban  (ELIQUIS ) 5 MG TABS tablet Take 5 mg by mouth 2 (two) times daily.    [provider]  atorvastatin  (LIPITOR) 20 MG tablet Take 1 tablet (20 mg total) by mouth daily. 08/01/24 08/31/24  Pahwani, Ravi, MD  bimatoprost (LUMIGAN) 0.03 % ophthalmic solution Place 1 drop into both eyes at bedtime.    [provider]  D3-50 1.25 MG (50000 UT) capsule Take 50,000 Units by mouth once a week. On mondays 09/09/23   [provider]  dorzolamide  (TRUSOPT ) 2 % ophthalmic solution Place 1 drop into both eyes 3 (three) times daily. 06/24/24   [provider]  LUMIGAN 0.01 % SOLN Place 1 drop into both eyes at bedtime. 07/14/24   [provider]  metoprolol  tartrate (LOPRESSOR ) 50 MG tablet Take 50 mg by mouth daily. 09/09/23   [provider]  mirtazapine  (REMERON ) 7.5 MG tablet Take 7.5 mg by mouth at bedtime.    [provider]  PREBIOTIC PRODUCT PO Take 1 capsule by mouth daily.    [provider]  predniSONE  (DELTASONE ) 5 MG tablet Take 5 mg by mouth daily. 04/25/24   [provider]  Probiotic Product (PROBIOTIC PO) Take 1 capsule by mouth daily.    [provider]     Family History  Problem Relation Age of Onset   Breast cancer Sister     Social History  Socioeconomic History   Marital status: Divorced    Spouse name: Not on file   Number of children: Not on file   Years of education: Not on file   Highest education level: Not on file  Occupational History   Not on file  Tobacco Use   Smoking status: Never    Passive exposure: Never   Smokeless tobacco: Never  Vaping Use   Vaping status: Never Used  Substance and Sexual Activity    Alcohol use: Yes   Drug use: Never   Sexual activity: Not on file  Other Topics Concern   Not on file  Social History Narrative   ** Merged History Encounter **       Social Drivers of Health   Financial Resource Strain: Not on file  Food Insecurity: Unknown (10/18/2023)   Hunger Vital Sign    Worried About Running Out of Food in the Last Year: Never true    Ran Out of Food in the Last Year: Patient declined  Transportation Needs: Patient Declined (10/18/2023)   PRAPARE - Administrator, Civil Service (Medical): Patient declined    Lack of Transportation (Non-Medical): Patient declined  Physical Activity: Not on file  Stress: Not on file  Social Connections: Patient Unable To Answer (07/23/2024)   Social Connection and Isolation Panel    Frequency of Communication with Friends and Family: Patient unable to answer    Frequency of Social Gatherings with Friends and Family: Patient unable to answer    Attends Religious Services: Patient unable to answer    Active Member of Clubs or Organizations: Patient unable to answer    Attends Banker Meetings: Patient unable to answer    Marital Status: Patient unable to answer    ROS: She denies pain in her abdomen or any other location, fever, chills, N/V, sore throat, SOB. ROS somewhat limited by mental status.   Vital Signs: BP (!) 110/55 (BP Location: Right Arm)   Pulse 74   Temp 99.1 F (37.3 C) (Oral)   Resp 16   Ht 5' 2 (1.575 m)   Wt 122 lb 2.2 oz (55.4 kg)   SpO2 99%   BMI 22.34 kg/m   Advance Care Plan: No documents on file  Physical Exam Constitutional:      Appearance: She is normal weight.  HENT:     Mouth/Throat:     Mouth: Mucous membranes are moist.     Pharynx: Oropharynx is clear.  Cardiovascular:     Rate and Rhythm: Normal rate and regular rhythm.     Pulses: Normal pulses.     Heart sounds: Normal heart sounds.  Pulmonary:     Effort: Pulmonary effort is normal.     Breath  sounds: Normal breath sounds.  Abdominal:     General: There is no distension.     Palpations: Abdomen is soft.     Comments: Mild LUQ tenderness  Musculoskeletal:     Right lower leg: No edema.     Left lower leg: No edema.     Comments: No rash or lesion to neck or RUQ  Skin:    General: Skin is warm and dry.  Neurological:     Mental Status: She is alert. She is disoriented.  Psychiatric:        Mood and Affect: Mood normal.        Behavior: Behavior normal.     Imaging: CT ABDOMEN PELVIS W CONTRAST Result Date: 08/02/2024  CLINICAL DATA:  The patient had a CTA chest earlier today for pulmonary embolism. A mass was noted in the central liver as well as evidence of peritoneal metastases in the left upper abdomen. EXAM: CT ABDOMEN AND PELVIS WITH CONTRAST TECHNIQUE: Multidetector CT imaging of the abdomen and pelvis was performed using the standard protocol following bolus administration of intravenous contrast. RADIATION DOSE REDUCTION: This exam was performed according to the departmental dose-optimization program which includes automated exposure control, adjustment of the mA and/or kV according to patient size and/or use of iterative reconstruction technique. CONTRAST:  60mL OMNIPAQUE  IOHEXOL  350 MG/ML SOLN COMPARISON:  Ultrasound complete abdomen 01/12/2019 was negative except for a 5 cm left renal cyst. The only other cross-sectional comparison is today's CTA chest. FINDINGS: Lower chest: There is mild cardiomegaly, minimal pericardial effusion. Scattered three-vessel coronary calcifications. Lung bases are clear apart from posterior atelectatic change in the lower lobes. Hepatobiliary: There are multiple hepatic masses consistent with metastases. The most likely primary is gastric given the observation of a circumferentially thickened mid to distal stomach and gastrohepatic ligament adenopathy. The largest mass appears to be a coalescence of at least 3 masses involving portions of  segments 5 and 8. This measures 7.7 x 6.6 cm on 3:18. There is a segment 4 mass measuring 2.8 x 3.4 cm on 3:25, and a segment 1 mass significantly narrowing the intrahepatic IVC measuring 4.3 x 4.1 cm on 3:17. Out laterally in segment 2 there is another mass measuring 5.4 x 3.3 cm on 3:17. There are few additional scattered small masses in both lobes. The gallbladder and bile ducts are unremarkable. Pancreas: No abnormality Spleen: No abnormality. Adrenals/Urinary Tract: There is no adrenal mass. In the left upper pole there is a 4.1 cm homogeneous Bosniak 2 cyst, Hounsfield density is 22.2. A second Bosniak 2 cyst just below this measures 1.2 cm and 23 Hounsfield units, and on the right there is a third parapelvic Bosniak 2 cyst in the inferior pole measuring 1.9 cm on 23 Hounsfield units. No follow-up imaging is recommended. There is no discrete renal mass enhancement. There is contrast in the collecting systems and ureters which would obscure stones but there is no hydronephrosis. The bladder wall and lumen are unremarkable. There is dense contrast opacifying the bladder. Stomach/Bowel: Circumferentially thickened wall in the mid distal stomach particularly posteriorly and inferiorly, suspicious for infiltrating disease such as carcinoma. The small bowel is normal caliber. The appendix is normal. There is moderate retained stool. No overt colonic wall thickening. Scattered uncomplicated diverticulosis. Vascular/Lymphatic: Aortic atherosclerosis. No AAA or acute vascular findings. There is mass effect on the intrahepatic IVC due to a segment 1 liver metastasis described above. No thrombus is seen within it. The portal vein and branches are widely patent. There is an enlarged necrotic gastrohepatic ligament lymph measuring 2.4 x 2.2 cm on 3:21. There are pathologic lymph nodes inferior to the stomach as well, ranging from 9 mm short axis up to the largest right of the midline measuring 2.4 x 1.6 cm on 3:38.  Additional evidence of peritoneal metastatic disease with left upper quadrant nodular peritoneal metastases measuring up to 1.6 x 1.5 cm on 3:26. No adenopathy is seen in the pelvis. Reproductive: Status post hysterectomy. No adnexal masses. Other: Small amount of pelvic ascites. No drainable pocket. No free air or free hemorrhage. Small umbilical fat hernia. Musculoskeletal: No regional bone metastasis is seen. Mild degenerative change thoracic and lumbar spine. IMPRESSION: 1. Multiple hepatic masses consistent with metastases. The  most likely primary is gastric given the circumferentially thickened mid to distal stomach, and hypogastric and gastrohepatic ligament adenopathy. 2. 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 3. Small amount of pelvic ascites. 4. Aortic and coronary artery atherosclerosis. 5. Cardiomegaly with minimal pericardial effusion. 6. Umbilical fat hernia. 7. These results will be called to the ordering clinician or representative by the Radiologist Assistant, and communication documented in the PACS or [employer]. Aortic Atherosclerosis (ICD10-I70.0). Electronically Signed   By: Francis Quam M.D.   On: 08/02/2024 00:35   CT Angio Chest Pulmonary Embolism (PE) W or WO Contrast Result Date: 08/01/2024 CLINICAL DATA:  Fever tachycardia EXAM: CT ANGIOGRAPHY CHEST WITH CONTRAST TECHNIQUE: Multidetector CT imaging of the chest was performed using the standard protocol during bolus administration of intravenous contrast. Multiplanar CT image reconstructions and MIPs were obtained to evaluate the vascular anatomy. RADIATION DOSE REDUCTION: This exam was performed according to the departmental dose-optimization program which includes automated exposure control, adjustment of the mA and/or kV according to patient size and/or use of iterative reconstruction technique. CONTRAST:  60mL OMNIPAQUE  IOHEXOL  350 MG/ML SOLN COMPARISON:  Chest x-ray 10/17/2023 FINDINGS: Cardiovascular:  Satisfactory opacification of the pulmonary arteries to the segmental level. Small emboli within right upper lobe subsegmental branches. Small volume right lower lobe segmental and subsegmental PE. Mild aortic atherosclerosis. No aneurysm. Mild coronary vascular calcification. Borderline cardiomegaly. No pericardial effusion Mediastinum/Nodes: Patent trachea. No thyroid mass. No suspicious lymph nodes. Esophagus within normal limits except for small hiatal hernia Lungs/Pleura: Mild dependent atelectasis. No consolidation or pleural effusion. Punctate 2 mm lingular pulmonary nodule, no specific imaging follow-up is recommended. Upper Abdomen: Incompletely visualized is vague hypodensity in the central liver, series 5 image 114. Soft tissue nodularity within the left upper quadrant incompletely assessed, series 5 image 91 through 114. Musculoskeletal: No acute osseous abnormality. 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 IMPRESSION: 1. Small volume right upper lobe and right lower lobe segmental and subsegmental pulmonary emboli. 2. Incompletely visualized vague hypodensity in the central liver, raising concern for possible liver lesion/mass. In addition, there is incompletely visualized nodularity within the left upper quadrant as could be seen with peritoneal metastatic disease. Recommend dedicated abdominopelvic contrast-enhanced CT. 3. Critical Value/emergent results were called by telephone at the time of interpretation on 08/01/2024 at 8:52 pm to provider NP Debby , who verbally acknowledged these results. Aortic Atherosclerosis (ICD10-I70.0). Electronically Signed   By: Luke Bun M.D.   On: 08/01/2024 20:53   VAS US  LOWER EXTREMITY VENOUS (DVT) Result Date: 07/25/2024  Lower Venous DVT Study Patient Name:  Tiffany [name]  Date of Exam:   07/25/2024 Medical Rec #: [id number]          Accession #:    [id number] Date of Birth: 02-16-1941          Patient Gender: F Patient Age:   72 years Exam Location:  [facility] Procedure:      VAS US  LOWER EXTREMITY VENOUS (DVT) Referring Phys: HARLENE BOWL --------------------------------------------------------------------------------  Indications: Superficial venous thrombosis (SVT) I82.819.  Risk Factors: None identified. Comparison Study: No prior studies. Performing Technologist: Cordella Collet RVT  Examination Guidelines: A complete evaluation includes B-mode imaging, spectral Doppler, color Doppler, and power Doppler as needed of all accessible portions of each vessel. Bilateral testing is considered an integral part of a complete examination. Limited examinations for reoccurring indications may be  performed as noted. The reflux portion of the exam is performed with the patient in reverse Trendelenburg.  +---------+---------------+---------+-----------+----------+--------------+ RIGHT    CompressibilityPhasicitySpontaneityPropertiesThrombus Aging +---------+---------------+---------+-----------+----------+--------------+ CFV      Full           Yes      Yes                                 +---------+---------------+---------+-----------+----------+--------------+ SFJ      Full                                                        +---------+---------------+---------+-----------+----------+--------------+ FV Prox  Full                                                        +---------+---------------+---------+-----------+----------+--------------+ FV Mid   Full                                                        +---------+---------------+---------+-----------+----------+--------------+ FV DistalFull                                                        +---------+---------------+---------+-----------+----------+--------------+ PFV      Full                                                         +---------+---------------+---------+-----------+----------+--------------+ POP      None           No       No                   Acute          +---------+---------------+---------+-----------+----------+--------------+ PTV      Partial                                      Acute          +---------+---------------+---------+-----------+----------+--------------+ PERO     None                                         Acute          +---------+---------------+---------+-----------+----------+--------------+ Gastroc  Full                                                        +---------+---------------+---------+-----------+----------+--------------+   +----+---------------+---------+-----------+----------+--------------+  LEFTCompressibilityPhasicitySpontaneityPropertiesThrombus Aging +----+---------------+---------+-----------+----------+--------------+ CFV Full           Yes      Yes                                 +----+---------------+---------+-----------+----------+--------------+    Summary: RIGHT: - Findings consistent with acute deep vein thrombosis involving the right popliteal vein, right posterior tibial veins, and right peroneal veins.  - No cystic structure found in the popliteal fossa.  LEFT: - No evidence of common femoral vein obstruction.   *See table(s) above for measurements and observations. Electronically signed by Debby Robertson on 07/25/2024 at 5:14:24 PM.    Final    ECHOCARDIOGRAM COMPLETE Result Date: 07/24/2024    ECHOCARDIOGRAM REPORT   Patient Name:   Tiffany [name] Date of Exam: 07/24/2024 Medical Rec #:  [id number]        Height:       60.0 in Accession #:    [id number]       Weight:       130.0 lb Date of Birth:  03/16/1941         BSA:          1.554 m Patient Age:    83 years         BP:           122/67 mmHg Patient Gender: F                HR:           96 bpm. Exam Location:  Inpatient Procedure: 2D Echo, Cardiac Doppler and Color Doppler  (Both Spectral and Color            Flow Doppler were utilized during procedure). Indications:   Abnormal ECG R94.31  History:       Patient has no prior history of Echocardiogram examinations.                Stroke.  Sonographer:   Thea Norlander RCS Referring      [facility] SAMTANI Phys: IMPRESSIONS  1. Left ventricular ejection fraction, by estimation, is 60 to 65%. The left ventricle has normal function. The left ventricle has no regional wall motion abnormalities. Left ventricular diastolic parameters were normal.  2. Right ventricular systolic function is normal. The right ventricular size is normal.  3. The mitral valve is normal in structure. No evidence of mitral valve regurgitation. No evidence of mitral stenosis.  4. The aortic valve is abnormal. There is mild thickening of the aortic valve with a small subcentimeter echodensity at the LV side of the valve. There is mild aortic valve regurgitation. Consider TEE for further evaluation in setting of recent CVA.  5. The inferior vena cava is normal in size with greater than 50% respiratory variability, suggesting right atrial pressure of 3 mmHg. FINDINGS  Left Ventricle: Left ventricular ejection fraction, by estimation, is 60 to 65%. The left ventricle has normal function. The left ventricle has no regional wall motion abnormalities. The left ventricular internal cavity size was normal in size. There is  no left ventricular hypertrophy. Left ventricular diastolic parameters were normal. Right Ventricle: The right ventricular size is normal. No increase in right ventricular wall thickness. Right ventricular systolic function is normal. Left Atrium: Left atrial size was normal in size. Right Atrium: Right atrial size was normal in size. Pericardium: There is no evidence of pericardial effusion. Mitral Valve: The mitral  valve is normal in structure. No evidence of mitral valve regurgitation. No evidence of mitral valve stenosis. Tricuspid Valve: The  tricuspid valve is normal in structure. Tricuspid valve regurgitation is not demonstrated. No evidence of tricuspid stenosis. Aortic Valve: The aortic valve is abnormal. There is mild thickening of the aortic valve. Aortic valve regurgitation is mild. No aortic stenosis is present. Aortic valve peak gradient measures 10.4 mmHg. Pulmonic Valve: The pulmonic valve was normal in structure. Pulmonic valve regurgitation is not visualized. No evidence of pulmonic stenosis. Aorta: The aortic root is normal in size and structure. Venous: The inferior vena cava is normal in size with greater than 50% respiratory variability, suggesting right atrial pressure of 3 mmHg. IAS/Shunts: No atrial level shunt detected by color flow Doppler.  LEFT VENTRICLE PLAX 2D LVIDd:         4.20 cm   Diastology LVIDs:         2.50 cm   LV e' medial:    9.46 cm/s LV PW:         0.90 cm   LV E/e' medial:  6.6 LV IVS:        0.90 cm   LV e' lateral:   14.70 cm/s LVOT diam:     2.00 cm   LV E/e' lateral: 4.2 LV SV:         51 LV SV Index:   33 LVOT Area:     3.14 cm  RIGHT VENTRICLE             IVC RV S prime:     13.80 cm/s  IVC diam: 1.40 cm TAPSE (M-mode): 1.9 cm LEFT ATRIUM             Index        RIGHT ATRIUM           Index LA diam:        4.30 cm 2.77 cm/m   RA Area:     10.50 cm LA Vol (A2C):   39.8 ml 25.61 ml/m  RA Volume:   18.63 ml  11.99 ml/m LA Vol (A4C):   26.8 ml 17.24 ml/m LA Biplane Vol: 30.1 ml 19.37 ml/m  AORTIC VALVE AV Area (Vmax): 1.92 cm AV Vmax:        161.00 cm/s AV Peak Grad:   10.4 mmHg LVOT Vmax:      98.50 cm/s LVOT Vmean:     65.200 cm/s LVOT VTI:       0.163 m  AORTA Ao Root diam: 3.00 cm Ao Asc diam:  3.60 cm MITRAL VALVE MV Area (PHT): 5.31 cm    SHUNTS MV Decel Time: 143 msec    Systemic VTI:  0.16 m MV E velocity: 62.40 cm/s  Systemic Diam: 2.00 cm MV A velocity: 96.40 cm/s MV E/A ratio:  0.65 Aditya Sabharwal Electronically signed by Ria Commander Signature Date/Time: 07/24/2024/3:45:09 PM    Final     MR BRAIN WO CONTRAST Result Date: 07/24/2024 CLINICAL DATA:  Provided history: Stroke, follow-up. EXAM: MRI HEAD WITHOUT CONTRAST TECHNIQUE: Multiplanar, multiecho pulse sequences of the brain and surrounding structures were obtained without intravenous contrast. COMPARISON:  Non-contrast head CT and CT angiogram head/neck 07/23/2024. FINDINGS: Intermittently motion degraded examination. Most notably, the axial T1 sequence is moderate to severely motion degraded and the coronal T2 sequence is severely motion degraded. Within this limitation, findings are swallows. Brain: Generalized cerebral atrophy. Large acute left MCA territory infarct affecting the posterior insula, posterior frontal lobe,  parietal lobe and occipital lobe. This infarct does not appear significantly changed in extent as compared to yesterday's head CT. No significant mass effect at this time. No evidence of hemorrhagic conversion. Tiny acute cortical infarct within the perirolandic right frontoparietal region. Tiny acute infarct within the right parietal white matter (series 2, image 36). Mild multifocal T2 FLAIR hyperintense signal abnormality elsewhere within the cerebral white matter and pons, nonspecific but compatible chronic small ischemic disease. Redemonstrated chronic microhemorrhage within the left occipital lobe. Tiny acute infarcts within the bilateral cerebellar hemispheres. probable small (2-3 mm) meningioma along the right petrous apex better appreciated on the prior brain MRI of 12/28/2021 (series 6, image 8 of the current exam). No extra-axial fluid collection. No midline shift. Vascular: Please see the CTA head/neck performed yesterday. Skull and upper cervical spine: No focal race marrow lesion. Sinuses/Orbits: No mass or acute finding within the imaged orbits. Prior bilateral ocular lens replacement. No significant paranasal sinus disease. IMPRESSION: 1. Intermittently motion degraded examination as described. Within this  limitation, findings are as follows. 2. Large acute left MCA territory infarct, as described and not significantly changed in extent since yesterday's head CT. No significant mass effect at this time. No evidence of hemorrhagic conversion. 3. Additional tiny acute infarcts within the perirolandic right frontoparietal cortex, right parietal white matter and bilateral cerebellar hemispheres. 4. Background parenchymal atrophy and chronic small vessel ischemic disease. 5. 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 Electronically Signed   By: Rockey Childs D.O.   On: 07/24/2024 13:13   CT ANGIO HEAD NECK W WO CM Result Date: 07/23/2024 CLINICAL DATA:  83 year old female with confusion, abnormal speech. Recently started on Eliquis . Severe anemia. Cytotoxic edema in the posterior left hemisphere on head CT last night. EXAM: CT ANGIOGRAPHY HEAD AND NECK WITH AND WITHOUT CONTRAST TECHNIQUE: Multidetector CT imaging of the head and neck was performed using the standard protocol during bolus administration of intravenous contrast. Multiplanar CT image reconstructions and MIPs were obtained to evaluate the vascular anatomy. Carotid stenosis measurements (when applicable) are obtained utilizing NASCET criteria, using the distal internal carotid diameter as the denominator. RADIATION DOSE REDUCTION: This exam was performed according to the departmental dose-optimization program which includes automated exposure control, adjustment of the mA and/or kV according to patient size and/or use of iterative reconstruction technique. CONTRAST:  OMNIPAQUE  IOHEXOL  350 MG/ML SOLN COMPARISON:  Head CT 2302 hours yesterday. FINDINGS: CT HEAD Brain: Confluent cytotoxic edema posterior left posterior MCA versus MCA/PCA watershed area appears stable and size and extent since last night. No associated mass effect. No hemorrhagic transformation. Stable gray-white matter  differentiation throughout the brain. No intracranial mass effect or midline shift. Stable nonspecific ventriculomegaly. Basilar cisterns remain normal. Calvarium and skull base: Intact. No acute osseous abnormality identified. Paranasal sinuses: Visualized paranasal sinuses and mastoids are stable and well aerated. Orbits: No gaze deviation.  Stable orbit and scalp soft tissues. CTA NECK Skeleton: Levoconvex cervical scoliosis. No acute osseous abnormality identified. Incidental benign appearing right subscapularis intramuscular lipoma (series 5, image 303). Upper chest: Negative. Other neck: Nonvascular neck soft tissue spaces appear negative. Aortic arch: Mild arch tortuosity and calcified plaque. Bovine arch configuration. Right carotid system: Tortuous brachiocephalic artery and right CCA origin without plaque or stenosis. Negative right carotid bifurcation. No stenosis. Left carotid system: Bovine left CCA origin with tortuosity. Mild calcified plaque at the posterior left ICA origin. Mildly tortuous left ICA without stenosis. Vertebral  arteries: Tortuous proximal right subclavian artery without plaque or stenosis. Minimal calcified plaque at the right vertebral artery origin without stenosis. Tortuous right vertebral artery is patent to the neck with no other plaque or stenosis. Proximal left subclavian artery atherosclerosis and tortuosity, less than 50 % stenosis with respect to the distal vessel. Normal left vertebral artery origin. Tortuous left V1 segment. Codominant left vertebral artery is tortuous but patent without stenosis to the skull base. CTA HEAD Posterior circulation: Codominant distal vertebral arteries and vertebrobasilar junction are patent. Distal left V4 calcified plaque with only mild stenosis. Bilateral AICA appear dominant and patent. Patent basilar artery with mild mid basilar irregularity. No significant basilar stenosis. Patent SCA and fetal type bilateral PCA origins. Bilateral PCA  branches are tortuous with mild irregularity. No significant PCA stenosis, mild bilateral P2 segment irregularity and stenosis (series 22, image 27 on the right). Anterior circulation: Both ICA siphons are patent. Left siphon mild ectasia and calcified plaque without stenosis. Normal left posterior communicating artery origin. Similar right siphon ectasia and mild calcified plaque without stenosis. Normal right posterior communicating artery origin. Patent carotid termini. Normal MCA and ACA origins. Diminutive anterior communicating artery. Bilateral ACA branches are within normal limits. Right MCA M1 segment is tortuous, right MCA trifurcation is patent without stenosis. Right MCA branches are within normal limits. Left MCA M1 segment bifurcates early without stenosis. There is a posterior left MCA branch occlusion, appears to be M3 or M4 on series 22, image 37. Other left MCA branches are within normal limits. Venous sinuses: Patent. Anatomic variants: Fetal type PCA origins. Bovine arch configuration. Review of the MIP images confirms the above findings IMPRESSION: 1. Negative for large vessel occlusion. 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 2. 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 No hemorrhagic transformation or intracranial mass effect. 3. Generalized arterial tortuosity in the head and neck with mild for age atherosclerosis. No other significant stenosis. 4.  Aortic Atherosclerosis (ICD10-I70.0). Electronically Signed   By: VEAR Hurst M.D.   On: 07/23/2024 09:30   CT HEAD WO CONTRAST Result Date: 07/22/2024 CLINICAL DATA:  Memory loss and altered mental status. EXAM: CT HEAD WITHOUT CONTRAST TECHNIQUE: Contiguous axial images were obtained from the base of the skull through the vertex without intravenous contrast. RADIATION DOSE REDUCTION: This exam was performed according to the departmental dose-optimization program which  includes automated exposure control, adjustment of the mA and/or kV according to patient size and/or use of iterative reconstruction technique. COMPARISON:  Head CT 04/28/2024. FINDINGS: Brain: There is an acute/early subacute left occipitotemporal nonhemorrhagic infarct involving the posterior temporal lobe from the posterior sylvian cortex, extending contiguously posteriorly to the lateral occipital lobe over an area measuring 7.1 cm AP, 3.0 cm coronal, 3.3 cm in height. There is hyperdensity of the infarcting tissue, loss of sulci and loss of gray-white matter differentiation. The infarct extends from the cortical surface to the ventricular interface, without hemorrhage or downward mass effect or notable effacement of the posterior horn of the left ventricle. There is no further evidence of infarcts. There is mild cerebral atrophy and small-vessel disease and mild-to-moderate atrophic ventriculomegaly which was seen previously. Cerebellum and brainstem are unremarkable. No midline shift. Basal cisterns are clear. Vascular: Despite the findings I do not see a hyperdense vessel. There are calcific plaques both siphons, distal left vertebral artery. Skull: Negative for fractures. There is patchy sclerosis and thickening of the  squamous portion of the left temporal bone. In retrospect this was present on the prior CT and it seems to have been present on MRI from 12/28/2021. This could represent pagetoid changes of the bone or changes due to fibrous dysplasia, but a bone metastasis is not strictly excluded. Rest of the calvarium is unremarkable. Sinuses/Orbits: No acute finding. Old lens replacements. Clear sinuses and mastoids. Other: None. IMPRESSION: 1. 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 There sulcal effacement but no significant mass effect. 2. Atrophy and small-vessel disease.  Otherwise stable exam. 3. Patchy sclerosis and thickening of the squamous portion of the left temporal  bone. No change since the 04/28/2024 CT, and appears to have been present on a 12/28/2021 MRI brain although not as well depicted. This could represent pagetoid changes of the bone or changes due to fibrous dysplasia, but a bone metastasis is not strictly excluded. 4. PRA is attempting to reach the ordering physician for stat notification at the time of signing. Electronically Signed   By: Francis Quam M.D.   On: 07/22/2024 23:32   US  Venous Img Lower Unilateral Right (DVT) Result Date: 07/11/2024 CLINICAL DATA:  Right lower extremity redness, pain and swelling. EXAM: Right LOWER EXTREMITY VENOUS DOPPLER ULTRASOUND TECHNIQUE: Gray-scale sonography with graded compression, as well as color Doppler and duplex ultrasound were performed to evaluate the lower extremity deep venous systems from the level of the common femoral vein and including the common femoral, femoral, profunda femoral, popliteal and calf veins including the posterior tibial, peroneal and gastrocnemius veins when visible. The superficial great saphenous vein was also interrogated. Spectral Doppler was utilized to evaluate flow at rest and with distal augmentation maneuvers in the common femoral, femoral and popliteal veins. COMPARISON:  None Available. FINDINGS: Contralateral Common Femoral Vein: Respiratory phasicity is normal and symmetric with the symptomatic side. No evidence of thrombus. Normal compressibility. Common Femoral Vein: No evidence of thrombus. Normal compressibility, respiratory phasicity and response to augmentation. Saphenofemoral Junction: No evidence of thrombus. Normal compressibility and flow on color Doppler imaging. Profunda Femoral Vein: No evidence of thrombus. Normal compressibility and flow on color Doppler imaging. Femoral Vein: No evidence of thrombus. Normal compressibility, respiratory phasicity and response to augmentation. Popliteal Vein: There is occlusive thrombus in the right popliteal vein with  noncompressibility of the vessel. Calf Veins: There is occlusive thrombus in one of the paired peroneal veins. Superficial Great Saphenous Vein: No evidence of thrombus. Normal compressibility. Venous Reflux:  None. Other Findings: There is occlusive thrombus in the small saphenous vein. IMPRESSION: 1. Positive for DVT in the popliteal and peroneal veins. 2. Occlusive thrombus in the small saphenous vein. These results will be called to the ordering clinician or representative by the Radiologist Assistant, and communication documented in the PACS or [employer]. Electronically Signed   By: Vanetta Chou M.D.   On: 07/11/2024 16:53    Labs:  CBC: Recent Labs    07/30/24 0454 08/01/24 0527 08/02/24 0552 08/02/24 0942 08/03/24 0538  WBC 16.3* 14.6*  --  14.1* 16.7*  HGB 8.7* 7.3* 7.1* 7.0* 8.2*  HCT 27.6* 23.8* 22.5* 22.8* 26.7*  PLT 390 385  --  473* 470*    COAGS: Recent Labs    07/24/24 0217 08/02/24 0942 08/02/24 2004  INR 1.3*  --   --   APTT  --  36 40*    BMP: Recent Labs    07/25/24 1432 07/30/24 0454 08/01/24 0527 08/03/24 0538  NA 140 138 139 138  K  3.7 3.7 3.7 3.9  CL 106 106 105 108  CO2 21* 19* 21* 21*  GLUCOSE 110* 102* 91 85  BUN 13 23 18 15   CALCIUM  8.8* 8.6* 8.6* 8.2*  CREATININE 0.87 0.90 0.85 0.84  GFRNONAA >60 >60 >60 >60    LIVER FUNCTION TESTS: Recent Labs    10/17/23 2041 07/22/24 2221 07/24/24 0217 08/01/24 0527  BILITOT 0.6 0.9 1.2 0.5  AST 21 25 23  37  ALT 14 16 15 28   ALKPHOS 77 149* 141* 171*  PROT 7.5 7.8 6.5 6.2*  ALBUMIN 3.2* 2.7* 2.3* 2.1*    TUMOR MARKERS: No results for input(s): AFPTM, CEA, CA199, CHROMGRNA in the last 8760 hours.  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   VSS CBC and INR in AM NPO at [facility] not indicated pre procedure  Case reviewed with IR attending Dr. Jenna with approval to proceed with IVC filter and liver  lesion bx.   Team intends to complete IVC filter today or tomorrow and proceed with liver biopsy Friday or next week. She will be NPO tonight in preparation for this possibility. Spoke to her daughter, Ms. [name], who is in agreement with this plan.   Risks and benefits of IVC filter discussed with the family including, but not limited to bleeding, infection, contrast induced renal failure, filter fracture or migration which can lead to emergency surgery or even death, strut penetration with damage or irritation to adjacent structures and caval thrombosis.  Risks and benefits of liver biopsy was discussed with the patient and/or patient's family including, but not limited to bleeding, infection, damage to adjacent structures or low yield requiring additional tests  All of the family's questions were answered, family is agreeable to proceed. Consent signed and in IR.  Thank you for allowing our service to participate in Tiffany [name] 's care.  Electronically Signed: Laymon Coast, NP   08/03/2024, 2:51 PM      I spent a total of  40 Minutes   in face to face in clinical consultation, greater than 50% of which was counseling/coordinating care for IVC filter and liver biopsy

## 2024-08-03 NOTE — Anesthesia Preprocedure Evaluation (Addendum)
 Anesthesia Evaluation  Patient identified by MRN, date of birth, ID band Patient awake    Reviewed: Allergy & Precautions, H&P , NPO status , Patient's Chart, lab work & pertinent test results  Airway Mallampati: III  TM Distance: >3 FB Neck ROM: Full    Dental no notable dental hx. (+) Teeth Intact   Pulmonary neg pulmonary ROS   Pulmonary exam normal breath sounds clear to auscultation       Cardiovascular Exercise Tolerance: Good hypertension, Pt. on medications negative cardio ROS Normal cardiovascular exam Rhythm:Regular Rate:Normal  ECHO 25  1. Left ventricular ejection fraction, by estimation, is 60 to 65%. The  left ventricle has normal function. The left ventricle has no regional  wall motion abnormalities. Left ventricular diastolic parameters were  normal.   2. Right ventricular systolic function is normal. The right ventricular  size is normal.   3. The mitral valve is normal in structure. No evidence of mitral valve  regurgitation. No evidence of mitral stenosis.   4. The aortic valve is abnormal. There is mild thickening of the aortic  valve with a small subcentimeter echodensity at the LV side of the valve.  There is mild aortic valve regurgitation. Consider TEE for further  evaluation in setting of recent CVA.   5. The inferior vena cava is normal in size with greater than 50%  respiratory variability, suggesting right atrial pressure of 3 mmHg.     Neuro/Psych CVA negative neurological ROS  negative psych ROS   GI/Hepatic negative GI ROS, Neg liver ROS,,,  Endo/Other  negative endocrine ROS    Renal/GU negative Renal ROS  negative genitourinary   Musculoskeletal negative musculoskeletal ROS (+)    Abdominal   Peds negative pediatric ROS (+)  Hematology negative hematology ROS (+) Blood dyscrasia, anemia   Anesthesia Other Findings   Reproductive/Obstetrics negative OB ROS                               Anesthesia Physical Anesthesia Plan  ASA: 4 and emergent  Anesthesia Plan: MAC   Post-op Pain Management: Minimal or no pain anticipated   Induction: Intravenous  PONV Risk Score and Plan: 2 and Propofol infusion  Airway Management Planned: Simple Face Mask  Additional Equipment: None  Intra-op Plan:   Post-operative Plan:   Informed Consent: I have reviewed the patients History and Physical, chart, labs and discussed the procedure including the risks, benefits and alternatives for the proposed anesthesia with the patient or authorized representative who has indicated his/her understanding and acceptance.       Plan Discussed with: Anesthesiologist and CRNA  Anesthesia Plan Comments:          Anesthesia Quick Evaluation

## 2024-08-03 NOTE — Progress Notes (Signed)
 Occupational Therapy Session Note  Patient Details  Name: Miracle Mongillo MRN: [id number] Date of Birth: 02/23/1941  Today's Date: 08/03/2024 OT Missed Time: 45 Minutes Missed Time Reason: Unavailable (comment) (Off-unit procedure)   Short Term Goals: Week 1:  OT Short Term Goal 1 (Week 1): STGs=LTGs due to patient's estimated length of stay.  Skilled Therapeutic Interventions/Progress Updates:   Pt off unit for procedure, missing ~45 mins of skilled intervention. Missed minutes to be made up as patient appropriate and schedule permits.    Therapy Documentation Precautions:  Precautions Precautions: Fall Recall of Precautions/Restrictions: Impaired Precaution/Restrictions Comments: Aphasia Restrictions Weight Bearing Restrictions Per Provider Order: No   Therapy/Group: Individual Therapy  Nereida Habermann, OTR/L, MSOT  08/03/2024, 12:03 PM

## 2024-08-03 NOTE — Plan of Care (Signed)
  Problem: Consults Goal: RH STROKE PATIENT EDUCATION Description: See Patient Education module for education specifics  08/03/2024 1925 by Verdon Vicenta ORN, LPN Outcome: Progressing 08/03/2024 1924 by Verdon Vicenta ORN, LPN Outcome: Progressing 08/03/2024 1924 by Verdon Vicenta ORN, LPN Outcome: Progressing   Problem: RH SAFETY Goal: RH STG ADHERE TO SAFETY PRECAUTIONS W/ASSISTANCE/DEVICE Description: STG Adhere to Safety Precautions With cues Assistance/Device. 08/03/2024 1925 by Verdon Vicenta ORN, LPN Outcome: Progressing 08/03/2024 1924 by Verdon Vicenta ORN, LPN Outcome: Progressing 08/03/2024 1924 by Verdon Vicenta ORN, LPN Outcome: Progressing   Problem: RH KNOWLEDGE DEFICIT Goal: RH STG INCREASE KNOWLEDGE OF STROKE PROPHYLAXIS Description: Patient and family will be able to manage care/secondary risks at discharge using educational resources for medications and dietary modification independently Outcome: Progressing

## 2024-08-03 NOTE — Progress Notes (Signed)
 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  I discussed the case with her daughter again by phone.  She said that she had spoken with Dr. Ileana last evening, and after thinking it over more and discussing with family members, she does not want her mother to proceed with upper endoscopy today.  She is concerned about the elevated risks of endoscopy and sedation, particularly given the recent CVA.  She would be open to IR liver biopsy of one of the hepatic lesions to try to establish diagnosis.  Will transport the patient back to her room and will send a message to update primary inpatient team with consideration of IR consult for biopsy of one of the hepatic lesions.  Inpatient GI service will otherwise remain available as needed.  Sandor Flatter, DO, [facility]

## 2024-08-03 NOTE — Progress Notes (Addendum)
 PROGRESS NOTE   Subjective/Complaints:  Vitals stable.  Patient denies any concerns, complaints today.  Abdominal pain is better. Am labs with stable BMP; CBC HGB 8.2 after transfusion; appropriate. WBC stabel 14-16, plt 470.  Attempted EGD this a.m. for gastric biopsy, but after reviewing risks versus benefits patient's daughter declined and is opting for hepatic biopsy. Has been refusing most meals over last 2 days; daughter states she is eating 100% of dinner which she brings in from home, has had perhaps mild weight loss over the last 6 months but feels her appetite is currently sufficient.  Incontinent urine, LBM 9/28  Unable to obtain ROS due to patient cognitive status.     Objective:   CT ABDOMEN PELVIS W CONTRAST Result Date: 08/02/2024 CLINICAL DATA:  The patient had a CTA chest earlier today for pulmonary embolism. A mass was noted in the central liver as well as evidence of peritoneal metastases in the left upper abdomen. EXAM: CT ABDOMEN AND PELVIS WITH CONTRAST TECHNIQUE: Multidetector CT imaging of the abdomen and pelvis was performed using the standard protocol following bolus administration of intravenous contrast. RADIATION DOSE REDUCTION: This exam was performed according to the departmental dose-optimization program which includes automated exposure control, adjustment of the mA and/or kV according to patient size and/or use of iterative reconstruction technique. CONTRAST:  60mL OMNIPAQUE  IOHEXOL  350 MG/ML SOLN COMPARISON:  Ultrasound complete abdomen 01/12/2019 was negative except for a 5 cm left renal cyst. The only other cross-sectional comparison is today's CTA chest. FINDINGS: Lower chest: There is mild cardiomegaly, minimal pericardial effusion. Scattered three-vessel coronary calcifications. Lung bases are clear apart from posterior atelectatic change in the lower lobes. Hepatobiliary: There are multiple hepatic  masses consistent with metastases. The most likely primary is gastric given the observation of a circumferentially thickened mid to distal stomach and gastrohepatic ligament adenopathy. The largest mass appears to be a coalescence of at least 3 masses involving portions of segments 5 and 8. This measures 7.7 x 6.6 cm on 3:18. There is a segment 4 mass measuring 2.8 x 3.4 cm on 3:25, and a segment 1 mass significantly narrowing the intrahepatic IVC measuring 4.3 x 4.1 cm on 3:17. Out laterally in segment 2 there is another mass measuring 5.4 x 3.3 cm on 3:17. There are few additional scattered small masses in both lobes. The gallbladder and bile ducts are unremarkable. Pancreas: No abnormality Spleen: No abnormality. Adrenals/Urinary Tract: There is no adrenal mass. In the left upper pole there is a 4.1 cm homogeneous Bosniak 2 cyst, Hounsfield density is 22.2. A second Bosniak 2 cyst just below this measures 1.2 cm and 23 Hounsfield units, and on the right there is a third parapelvic Bosniak 2 cyst in the inferior pole measuring 1.9 cm on 23 Hounsfield units. No follow-up imaging is recommended. There is no discrete renal mass enhancement. There is contrast in the collecting systems and ureters which would obscure stones but there is no hydronephrosis. The bladder wall and lumen are unremarkable. There is dense contrast opacifying the bladder. Stomach/Bowel: Circumferentially thickened wall in the mid distal stomach particularly posteriorly and inferiorly, suspicious for infiltrating disease such as carcinoma. The small bowel is  normal caliber. The appendix is normal. There is moderate retained stool. No overt colonic wall thickening. Scattered uncomplicated diverticulosis. Vascular/Lymphatic: Aortic atherosclerosis. No AAA or acute vascular findings. There is mass effect on the intrahepatic IVC due to a segment 1 liver metastasis described above. No thrombus is seen within it. The portal vein and branches are  widely patent. There is an enlarged necrotic gastrohepatic ligament lymph measuring 2.4 x 2.2 cm on 3:21. There are pathologic lymph nodes inferior to the stomach as well, ranging from 9 mm short axis up to the largest right of the midline measuring 2.4 x 1.6 cm on 3:38. Additional evidence of peritoneal metastatic disease with left upper quadrant nodular peritoneal metastases measuring up to 1.6 x 1.5 cm on 3:26. No adenopathy is seen in the pelvis. Reproductive: Status post hysterectomy. No adnexal masses. Other: Small amount of pelvic ascites. No drainable pocket. No free air or free hemorrhage. Small umbilical fat hernia. Musculoskeletal: No regional bone metastasis is seen. Mild degenerative change thoracic and lumbar spine. IMPRESSION: 1. Multiple hepatic masses consistent with metastases. The most likely primary is gastric given the circumferentially thickened mid to distal stomach, and hypogastric and gastrohepatic ligament adenopathy. 2. 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 3. Small amount of pelvic ascites. 4. Aortic and coronary artery atherosclerosis. 5. Cardiomegaly with minimal pericardial effusion. 6. Umbilical fat hernia. 7. These results will be called to the ordering clinician or representative by the Radiologist Assistant, and communication documented in the PACS or [employer]. Aortic Atherosclerosis (ICD10-I70.0). Electronically Signed   By: Francis Quam M.D.   On: 08/02/2024 00:35   CT Angio Chest Pulmonary Embolism (PE) W or WO Contrast Result Date: 08/01/2024 CLINICAL DATA:  Fever tachycardia EXAM: CT ANGIOGRAPHY CHEST WITH CONTRAST TECHNIQUE: Multidetector CT imaging of the chest was performed using the standard protocol during bolus administration of intravenous contrast. Multiplanar CT image reconstructions and MIPs were obtained to evaluate the vascular anatomy. RADIATION DOSE REDUCTION: This exam was performed according to the departmental dose-optimization program  which includes automated exposure control, adjustment of the mA and/or kV according to patient size and/or use of iterative reconstruction technique. CONTRAST:  60mL OMNIPAQUE  IOHEXOL  350 MG/ML SOLN COMPARISON:  Chest x-ray 10/17/2023 FINDINGS: Cardiovascular: Satisfactory opacification of the pulmonary arteries to the segmental level. Small emboli within right upper lobe subsegmental branches. Small volume right lower lobe segmental and subsegmental PE. Mild aortic atherosclerosis. No aneurysm. Mild coronary vascular calcification. Borderline cardiomegaly. No pericardial effusion Mediastinum/Nodes: Patent trachea. No thyroid mass. No suspicious lymph nodes. Esophagus within normal limits except for small hiatal hernia Lungs/Pleura: Mild dependent atelectasis. No consolidation or pleural effusion. Punctate 2 mm lingular pulmonary nodule, no specific imaging follow-up is recommended. Upper Abdomen: Incompletely visualized is vague hypodensity in the central liver, series 5 image 114. Soft tissue nodularity within the left upper quadrant incompletely assessed, series 5 image 91 through 114. Musculoskeletal: No acute osseous abnormality. 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 IMPRESSION: 1. Small volume right upper lobe and right lower lobe segmental and subsegmental pulmonary emboli. 2. Incompletely visualized vague hypodensity in the central liver, raising concern for possible liver lesion/mass. In addition, there is incompletely visualized nodularity within the left upper quadrant as could be seen with peritoneal metastatic disease. Recommend dedicated abdominopelvic contrast-enhanced CT. 3. Critical Value/emergent results were called by telephone at the time of interpretation on 08/01/2024 at 8:52  pm to provider NP Debby , who verbally acknowledged these results. Aortic Atherosclerosis  (ICD10-I70.0). Electronically Signed   By: Luke Bun M.D.   On: 08/01/2024 20:53   Recent Labs    08/02/24 0942 08/03/24 0538  WBC 14.1* 16.7*  HGB 7.0* 8.2*  HCT 22.8* 26.7*  PLT 473* 470*   Recent Labs    08/01/24 0527 08/03/24 0538  NA 139 138  K 3.7 3.9  CL 105 108  CO2 21* 21*  GLUCOSE 91 85  BUN 18 15  CREATININE 0.85 0.84  CALCIUM  8.6* 8.2*    Intake/Output Summary (Last 24 hours) at 08/03/2024 0942 Last data filed at 08/02/2024 1445 Gross per 24 hour  Intake 315 ml  Output --  Net 315 ml        Physical Exam: Vital Signs Blood pressure 130/60, pulse 78, temperature (!) 97.4 F (36.3 C), temperature source Temporal, resp. rate 20, height 5' 2 (1.575 m), weight 55.4 kg, SpO2 100%.  Constitutional: No apparent distress. Appropriate appearance for age. Laying in bed, HENT: No JVD. Neck Supple. Trachea midline. Atraumatic, normocephalic. Eyes: PERRLA. EOMI. Visual fields grossly intact.  Cardiovascular: RRR, systolic murmur . No Edema. Peripheral pulses 2+.  Diffusely tender on palpation of the right anterior chest Respiratory: CTAB. No rales, rhonchi, or wheezing. On RA.  Abdomen: + bowel sounds, normoactive. No distention; diffusely tender on palpation Skin: C/D/I. No apparent lesions. MSK:      No apparent deformity.    Neurologic exam:  Cognition: Assessment limited by both expressive and receptive aphasia; a little more alert today, speaking in intermittent sentences, 40% intelligible. Insight: Poor insight into current condition.  Mood: Subdued, flat Sensation: Equal and intact in BL UE and Les.  Reflexes:  Negative Hoffman's and babinski signs bilaterally.  CN: Mild R facial droop Coordination: No apparent tremors. No ataxia   Spasticity: MAS 0 in all extremities.  Strength: Moving all 4 extremities antigravity against resistance        Assessment/Plan: 1. Functional deficits which require 3+ hours per day of interdisciplinary therapy in  a comprehensive inpatient rehab setting. Physiatrist is providing close team supervision and 24 hour management of active medical problems listed below. Physiatrist and rehab team continue to assess barriers to discharge/monitor patient progress toward functional and medical goals  Care Tool:  Bathing    Body parts bathed by patient: Right arm, Left arm, Chest, Abdomen, Front perineal area, Buttocks, Right upper leg, Left upper leg, Right lower leg, Left lower leg, Face         Bathing assist Assist Level: Contact Guard/Touching assist     Upper Body Dressing/Undressing Upper body dressing   What is the patient wearing?: Pull over shirt    Upper body assist Assist Level: Set up assist    Lower Body Dressing/Undressing Lower body dressing      What is the patient wearing?: Underwear/pull up, Pants     Lower body assist Assist for lower body dressing: Contact Guard/Touching assist     Toileting Toileting    Toileting assist Assist for toileting: Contact Guard/Touching assist     Transfers Chair/bed transfer  Transfers assist     Chair/bed transfer assist level: Minimal Assistance - Patient > 75%     Locomotion Ambulation   Ambulation assist      Assist level: Minimal Assistance - Patient > 75% Assistive device: No Device Max distance: 180'   Walk 10 feet activity   Assist     Assist  level: Minimal Assistance - Patient > 75% Assistive device: No Device   Walk 50 feet activity   Assist    Assist level: Minimal Assistance - Patient > 75% Assistive device: No Device    Walk 150 feet activity   Assist    Assist level: Minimal Assistance - Patient > 75% Assistive device: No Device    Walk 10 feet on uneven surface  activity   Assist     Assist level: Minimal Assistance - Patient > 75% Assistive device: Other (comment) (No device, but holding to RHR)   Wheelchair     Assist Is the patient using a wheelchair?: Yes Type of  Wheelchair: Manual    Wheelchair assist level: Dependent - Patient 0%      Wheelchair 50 feet with 2 turns activity    Assist        Assist Level: Dependent - Patient 0%   Wheelchair 150 feet activity     Assist      Assist Level: Dependent - Patient 0%   Blood pressure 130/60, pulse 78, temperature (!) 97.4 F (36.3 C), temperature source Temporal, resp. rate 20, height 5' 2 (1.575 m), weight 55.4 kg, SpO2 100%.  Medical Problem List and Plan: 1. 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 Onc, GI consulted. Called patient's daughter and left VM to discuss; did discuss workup with patient this AM as well. Close monitorring for neurologic changes given heparin ggt and risk for hemorrhagic conversion; planning to place IVC filter and DC AC   2.  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  May need to reduce dose to 2.5 mg if hemoglobin continues to downtrend.  Will get CTA to evaluate for PE given ongoing tachycardia, leukocytosis, and low-grade fevers with generalized chest/abdominal pain and history of DVT 9-9 and 9-23--would need this evaluated prior to adjustments of anticoagulation  - 10-1: CTA overnight with PE right upper and lower lobe, started on heparin ggt without bolus overnight - planning for IVC placement with heparin hold 4 hours prior- -10-2: IR may  do IVC filter placement today or tomorrow; pharmacy informed, will resume heparin drip if procedure not planned by 5 PM  3. Pain Management: tylenol  prn.   - Would not schedule Tylenol  due to potential to mask fevers.   - 10/1: Tx as above; can now schedule tylenol  1000 mg TID --improved  4. Mood/Behavior/Sleep: LCSW to follow for evaluation and support.             --melatonin prn insomnia.              -antipsychotic agents: N\/A   - 9-30: Withdrawn, poor attention.  Start fluoxetine 20 mg daily for motor recovery and behavior.  Start sleep log.   - sleep log appropriate  5. Neuropsych/cognition: This patient is not capable of making decisions on her own behalf.   6. Skin/Wound Care: Routine pressure relief measures.  7. Fluids/Electrolytes/Nutrition: Monitor I/O. Check CMET in am.    -9-30: A.m. labs stable  - 10-2: Per daughter, eating meals brought in from home, monitor weights weekly  8.   Acute on chronic anemia: Monitor for signs of bleeding. H/H stable 6.1-->8.7.  FOBT -9-23. --add iron supplement as Iron level <10.  -  9-30: Hemoglobin downtrending, 7.3.  No obvious source of bleeding.  Added FOBT today.  Repeat H&H in AM; may need to transfuse if any lower.  Continue Eliquis  at current dose but if signs of bleeding may need to discuss adjusting to 2.5 mg daily. 10-1: Hemoglobin 7.1, on heparin drip. Monitor daily. Transfusion 1 U PRBC ordered; repeat h/h this evening. Holley Schmitz discussed with patient's daughter over the phone.  10/2: Hgb responses 8.0; monitor - ?transition to oral ASA +/- IVC filter to assist in reducing bleed, will d/w GI and Onc today  9.  Resting tachycardia: HR ranging in 110-120 at rest. CTA to rule out PE?             -pt is not in distress, breathing comfortably, no chest pain, on eliquis              --Afebrile X 24 hours.   --Monitor for symptoms with increase in activity.  9-30: Remained in the 90s overnight and throughout therapies this a.m.; coming  up in the 110s this afternoon--stable 10/1-2  10. Leukocytosis: WBC on upward trend--16.0-->14.5-->16.3              -however, pt is afebrile, urine/chest clear, on full a/c --Sepsis work up prn temp elevation 9-30: WBCs downtrending 14.6--have been stable at approximately this level since 9-24.  No fevers, chills, obvious lines of infection.  Is complaining of some chest pain and temperature 100 this afternoon; CTA ordered as above -10/1: Right upper lower lobe PE, along with multiple intra-abdominal masses concerning for metastatic cancer; most likely source. Tx PE as above and monitor.  - 10/2 stable 14-16   11.  Elevated ALP, other LFTs are normal.  Trend.  12.  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  CT A/p 9/30 with: IMPRESSION: 1. Multiple hepatic masses consistent with metastases. The most likely primary is gastric given the circumferentially thickened mid to distal stomach, and hypogastric and gastrohepatic ligament adenopathy. 2. 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 3. Small amount of pelvic ascites.  - Called patient's daughter as above, left voicemail to review results. - Oncology consult placed for today; GI consult also placed for biopsy.   - 10/2: CEA, CA-19 pending. Daughter declined EGD for biopsy this AM. Per Oncology, may be able to perform liver biopsy for pathology; will follow up with Dr Lanny.   Addendum: Extensive discussion with patient's daughter regarding Dr. Demetra recommendation to start subcu Lovenox  for DVT prophylaxis, given that IVC filter would not prevent spontaneous clots developing in the lungs secondary to cancer induced coagulopathy, but will increase risk of bleeding with her already needing recurrent transfusions.  Discussed how she is likely to bleed even if she is off of anticoagulation.  Patient's daughter demonstrates understanding of the options, and opts for IVC filter placement off of anticoagulation for the  foreseeable future.  Also, which is to proceed with liver biopsy as soon as possible with interventional radiology.  Would decline future EGD given risks.  Understands that  patient is likely functionally not at the point where she may receive treatment for her cancer, but still wishes to go through rehabilitation to optimize functional improvement and give her the best shot at being able to take these treatments.  Discussed this with IR today, planning IVC filter and liver biopsy, pending OR availability.  LOS: 3 days A FACE TO FACE EVALUATION WAS PERFORMED  Joesph JAYSON Likes 08/03/2024, 9:42 AM

## 2024-08-03 NOTE — IPOC Note (Addendum)
 Overall Plan of Care [facility]) Patient Details Name: Tiffany [name] MRN: [id number] DOB: November 06, 1940  Admitting Diagnosis: Acute ischemic left middle cerebral artery (MCA) stroke [facility])  Hospital Problems: Principal Problem:   Acute ischemic left middle cerebral artery (MCA) stroke (HCC) Active Problems:   Cognitive and neurobehavioral dysfunction   Acute on chronic anemia   Gastric mass   Abnormal finding on GI tract imaging     Functional Problem List: Nursing Bowel, Endurance, Medication Management, Safety  PT Balance, Behavior, Endurance, Motor, Perception, Safety, Sensory  OT Balance, Cognition, Endurance, Motor, Safety, Vision  SLP Linguistic, Perception  TR         Basic ADL's: OT Bathing, Dressing, Toileting     Advanced  ADL's: OT       Transfers: PT Bed Mobility, Bed to Chair, Customer service manager, Tub/Shower     Locomotion: PT Ambulation, Stairs     Additional Impairments: OT    SLP Communication comprehension, expression    TR      Anticipated Outcomes Item Anticipated Outcome  Self Feeding    Swallowing      Basic self-care  Supervision  Toileting  Supervision   Bathroom Transfers Supervision  Bowel/Bladder  n/a  Transfers  supervision  Locomotion  supervision  Communication  modA  Cognition     Pain  n/a  Safety/Judgment  manage safety w cues   Therapy Plan: PT Intensity: Minimum of 1-2 x/day ,45 to 90 minutes PT Frequency: 5 out of 7 days PT Duration Estimated Length of Stay: 10 days OT Intensity: Minimum of 1-2 x/day, 45 to 90 minutes OT Frequency: 5 out of 7 days OT Duration/Estimated Length of Stay: 10-12 days SLP Intensity: Minumum of 1-2 x/day, 30 to 90 minutes SLP Frequency: 3 to 5 out of 7 days SLP Duration/Estimated Length of Stay: 10/10   Team Interventions: Nursing Interventions Patient/Family Education, Disease Management/Prevention, Medication Management, Discharge Planning  PT interventions Ambulation/gait training,  Disease management/prevention, Pain management, Stair training, Visual/perceptual remediation/compensation, Warden/ranger, DME/adaptive equipment instruction, Patient/family education, Therapeutic Activities, Cognitive remediation/compensation, Psychosocial support, Therapeutic Exercise, Community reintegration, Functional mobility training, UE/LE Strength taining/ROM, Discharge planning, Neuromuscular re-education, Splinting/orthotics, UE/LE Coordination activities  OT Interventions Warden/ranger, Cognitive remediation/compensation, Community reintegration, Discharge planning, Disease mangement/prevention, DME/adaptive equipment instruction, Functional mobility training, Neuromuscular re-education, Pain management, Patient/family education, Psychosocial support, Self Care/advanced ADL retraining, Skin care/wound managment, Therapeutic Activities, Therapeutic Exercise, UE/LE Strength taining/ROM, UE/LE Coordination activities, Visual/perceptual remediation/compensation  SLP Interventions Functional tasks, Cueing hierarchy, Internal/external aids, Patient/family education, Speech/Language facilitation, Therapeutic Activities, Multimodal communication approach, Environmental controls  TR Interventions    SW/CM Interventions Discharge Planning, Psychosocial Support, Patient/Family Education   Barriers to Discharge MD  Medical stability, Home enviroment access/loayout, Lack of/limited family support, Insurance for SNF coverage, Pending surgery, Pending chemo/radiation, Behavior, and Nutritional means  Nursing Decreased caregiver support, Home environment access/layout 1 level 5 ste no rails; Pt's daughter works Museum/gallery exhibitions officer; picks up weekday hours occasionally. Pt SIL is home Saturday and Sunday. Pt sister who lives in [place] is willing to come and help out. Pt flew from [facility] by herself 2 weeks ago PTA  ADLs Comments: Pt enjoys sewing; has a pillbox but does not use it  PT Inaccessible home  environment, Behavior 5 STE, unsure assist level at home  OT Lack of/limited family support    SLP Other (comments), Decreased caregiver support aphasia, safety  SW Decreased caregiver support, Lack of/limited family support, Insurance for SNF coverage     Team Discharge Planning:  Destination: PT-Home ,OT- Home , SLP-Home Projected Follow-up: PT-Outpatient PT, OT-  Outpatient OT, SLP-24 hour supervision/assistance Projected Equipment Needs: PT-To be determined, OT- To be determined, SLP-None recommended by SLP Equipment Details: PT- , OT-  Patient/family involved in discharge planning: PT- Patient unable/family or caregiver not available,  OT-Patient, SLP-Patient unable/family or caregive not available  MD ELOS: 7-9 days Medical Rehab Prognosis:  Guarded Assessment: The patient has been admitted for CIR therapies with the diagnosis of CVA. The team will be addressing functional mobility, strength, stamina, balance, safety, adaptive techniques and equipment, self-care, bowel and bladder mgt, patient and caregiver education,. Goals have been set at Supervision PT/OT/SLP. Anticipated discharge destination is home.       See Team Conference Notes for weekly updates to the plan of care

## 2024-08-03 NOTE — Progress Notes (Signed)
 PHARMACY - ANTICOAGULATION CONSULT NOTE  Pharmacy Consult for Heparin Indication: pulmonary embolus and DVT  Allergies  Allergen Reactions   Elemental Sulfur Hives   Shellfish Allergy Anaphylaxis   Shellfish Allergy    Sulfa Antibiotics Hives    Patient Measurements: Height: 5' 2 (157.5 cm) Weight: 55.4 kg (122 lb 2.2 oz) IBW/kg (Calculated) : 50.1 HEPARIN DW (KG): 55.4  Vital Signs: Temp: 99.1 F (37.3 C) (10/02 1251) Temp Source: Oral (10/02 1251) BP: 110/55 (10/02 1251) Pulse Rate: 74 (10/02 1251)  Labs: Recent Labs    08/01/24 0527 08/02/24 0552 08/02/24 0942 08/02/24 2004 08/03/24 0538  HGB 7.3* 7.1* 7.0*  --  8.2*  HCT 23.8* 22.5* 22.8*  --  26.7*  PLT 385  --  473*  --  470*  APTT  --   --  36 40*  --   HEPARINUNFRC  --   --  >1.10*  --   --   CREATININE 0.85  --   --   --  0.84    Estimated Creatinine Clearance: 40.8 mL/min (by C-G formula based on SCr of 0.84 mg/dL).  Assessment: 89 YOF with recent RLE DVT per duplex 07/11/24 and started on Eliquis . Concern for embolic stroke on 9/21. and CTA on 9/30 with RUL and RLL pulmonary emboli of unknown onset. Concern for Eliquis  failure so pharmacy consulted to change to heparin infusion with no bolus. Last Eliquis  dose 9/30 at 2029 so started heparin infusion conservatively with low therapeutic goals, heparin level goal of 0.3-0.5 and aPTT of 66-85 given recent stroke. Expect heparin levels to be falsely elevated with recent Eliquis  dosing, so will use aPTTs for monitoring until correlating.   Discussed with P. Love, PA-C on 10/1, who d/w Heme/Onc and Neuro and to continue heparin for now. 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 GI consulted for EGD and biopsies, scheduled for 10/2 and heparin drip held at 0430. Procedure cancelled. Continued to hold heparin for possible IVC filter today and liver biopsy on 10/3. IVC filter postponed until 10/3 due to emergency cases in IR.  Per d/w Dr.  Emeline resume heparin at 5pm if no procedure today. Per Dr Jenna IR, okay to resume heparin, and stop at 0600 on 10/3 for IVC filter.  Last aPTT 10/1 pm was subtherapeutic (40 seconds) on 600 units/hr and drip was increased to 750 units/hr ~10pm. Drip was off before next aPTT was due.  Willl resume at prior rate.  Hemoglobin low 7.3 prior to heparin start and 7.0 on 10/1.  1 unit PRBCs given and up to 8.2 today. Platelet count 470s.  Goal of Therapy:  Heparin level 0.3-0.5 units/ml aPTT 66-82 seconds Monitor platelets by anticoagulation protocol: Yes   Plan:  Resume heparin drip at 750 units/hr. aPTT ~8 hrs after rate change. Heparin drip to be held at 0600 on 10/3 for IVC filter in IR. RN aware. Daily aPTT and heparin level until correlating; daily CBC. Will follow up post-procedure, and for timing of liver biopsy.  Genaro Zebedee Calin, RPh 08/03/2024,5:25 PM

## 2024-08-03 NOTE — Progress Notes (Signed)
 Physical Therapy Session Note  Patient Details  Name: Tiffany [name] MRN: [id number] Date of Birth: 05-14-1941  Today's Date: 08/03/2024 PT Individual Time: 1005-1028 PT Individual Time Calculation (min): 23 min   Short Term Goals: Week 1:  PT Short Term Goal 1 (Week 1): STG = LTG due to ELOS  Skilled Therapeutic Interventions/Progress Updates:    Pt presents in room in bed, initially refusing session due to being hungry secondary to NPO status from cancelled endoscopy procedure this AM however pt agreeable to session with focus on therapeutic activities for self care tasks with encouragement. RN notified of pt having not had breakfast who reports will follow up on changing diet orders. Pt doffs hospital gown and dons button up shirt in long sitting in bed, requires no cues for donning shirt but min cues for sequencing for changing underwear as pt attempting to put on new underwear prior to taking off old. Pt then requests to use restroom, pt ambulates without device however reaching to holding onto nearby objects for stability, requires CGA/min assist and comes to sitting on toilet. Pt continent of void, charted. Pt able to complete periarea hygiene with supervision, pt dons new undewear while seated with supervision, pt stands to don pants, using grab bar and wall for stability for threading each leg, CGA/light min assist for postural stability with task. Pt ambulates to sink to complete hand hygiene, requires skilled cues for sequencing hand hygiene and attention to R side. Pt comes to sitting on EOB with CGA, completes bed mobility with supervision. Pt declining further participation with therapy session and missing 37 minutes out of 60 min session. Pt remains semi reclined in bed with all needs within reach, call light in place, and bed alarm activated at end of session.  Therapy Documentation Precautions:  Precautions Precautions: Fall Recall of Precautions/Restrictions:  Impaired Precaution/Restrictions Comments: Aphasia Restrictions Weight Bearing Restrictions Per Provider Order: No     Therapy/Group: Individual Therapy  Reche Ohara PT, DPT 08/03/2024, 10:38 AM

## 2024-08-03 NOTE — Plan of Care (Signed)
  Problem: Consults Goal: RH STROKE PATIENT EDUCATION Description: See Patient Education module for education specifics  Outcome: Progressing   Problem: RH SAFETY Goal: RH STG ADHERE TO SAFETY PRECAUTIONS W/ASSISTANCE/DEVICE Description: STG Adhere to Safety Precautions With cues Assistance/Device. Outcome: Progressing

## 2024-08-03 NOTE — Progress Notes (Signed)
 Speech Language Pathology Daily Session Note  Patient Details  Name: Tiffany [name] MRN: [id number] Date of Birth: 1940/12/03  Today's Date: 08/03/2024 SLP Individual Time: 1410-1438 SLP Individual Time Calculation (min): 28 min and Today's Date: 08/03/2024 SLP Missed Time: 17 Minutes Missed Time Reason: Other (Comment) (consult and participation)  Short Term Goals: Week 1: SLP Short Term Goal 1 (Week 1): STGs = LTGs d/t ELOS  Skilled Therapeutic Interventions:   Pt greeted at bedside for tx targeting communication. Initial 10 mins missed d/t NP consult. She appeared fatigued and in reduced spirits as compared to prev tx sessoins. SLP facilitated very functional tasks targeting auditory comprehension and expression. She was provided w/ calendar to assist w/ orientation. With modA she was able to read the month. SLP provided remainder of date w/ errorless learning. Pt did formulate an appropriate sentence in response to this, no take me home. Attempted written expression task given participation w/ calendar, however, pt indicated R arm pain via wincing and grabbing of arm. She was able to respond Yes when asked if she wanted pain medication. Unable to complete written tasks given arm pain. She then became perseverative on her daughter visiting and reduced initiation of responses was noted in conversation w/ SLP. Tx tasks discontinued and additional 7 mins were missed. At the end of tx tasks, she was able to state see ya next time. MaxA required overall for functional comprehension and expression. She was left in bed w/ the alarm set and call light within reach. Recommend cont ST per POC.   Pain R arm pain. Unable to rate. Nursing notified.   Therapy/Group: Individual Therapy  [name] 08/03/2024, 2:46 PM

## 2024-08-03 NOTE — Plan of Care (Signed)
  Problem: Consults Goal: RH STROKE PATIENT EDUCATION Description: See Patient Education module for education specifics  08/03/2024 1924 by Verdon Vicenta ORN, LPN Outcome: Progressing 08/03/2024 1924 by Verdon Vicenta ORN, LPN Outcome: Progressing   Problem: RH SAFETY Goal: RH STG ADHERE TO SAFETY PRECAUTIONS W/ASSISTANCE/DEVICE Description: STG Adhere to Safety Precautions With cues Assistance/Device. 08/03/2024 1924 by Verdon Vicenta ORN, LPN Outcome: Progressing 08/03/2024 1924 by Verdon Vicenta ORN, LPN Outcome: Progressing   Problem: RH KNOWLEDGE DEFICIT Goal: RH STG INCREASE KNOWLEDGE OF STROKE PROPHYLAXIS Description: Patient and family will be able to manage care/secondary risks at discharge using educational resources for medications and dietary modification independently Outcome: Progressing

## 2024-08-04 ENCOUNTER — Inpatient Hospital Stay (HOSPITAL_COMMUNITY)

## 2024-08-04 DIAGNOSIS — I63512 Cerebral infarction due to unspecified occlusion or stenosis of left middle cerebral artery: Secondary | ICD-10-CM | POA: Diagnosis not present

## 2024-08-04 HISTORY — PX: IR IVC FILTER PLMT / S&I /IMG GUID/MOD SED: IMG701

## 2024-08-04 HISTORY — PX: IR US LIVER BIOPSY: IMG936

## 2024-08-04 LAB — CBC
HCT: 27.7 % — ABNORMAL LOW (ref 36.0–46.0)
Hemoglobin: 8.4 g/dL — ABNORMAL LOW (ref 12.0–15.0)
MCH: 25.2 pg — ABNORMAL LOW (ref 26.0–34.0)
MCHC: 30.3 g/dL (ref 30.0–36.0)
MCV: 83.2 fL (ref 80.0–100.0)
Platelets: 529 K/uL — ABNORMAL HIGH (ref 150–400)
RBC: 3.33 MIL/uL — ABNORMAL LOW (ref 3.87–5.11)
RDW: 18.9 % — ABNORMAL HIGH (ref 11.5–15.5)
WBC: 18.3 K/uL — ABNORMAL HIGH (ref 4.0–10.5)
nRBC: 0 % (ref 0.0–0.2)

## 2024-08-04 LAB — PROTIME-INR
INR: 1.3 — ABNORMAL HIGH (ref 0.8–1.2)
Prothrombin Time: 16.7 s — ABNORMAL HIGH (ref 11.4–15.2)

## 2024-08-04 LAB — CANCER ANTIGEN 19-9: CA 19-9: 20 U/mL (ref 0–35)

## 2024-08-04 LAB — CEA: CEA: 326 ng/mL — ABNORMAL HIGH (ref 0.0–4.7)

## 2024-08-04 MED ORDER — LIDOCAINE HCL 1 % IJ SOLN
INTRAMUSCULAR | Status: AC
  Filled 2024-08-04: qty 20

## 2024-08-04 MED ORDER — IOHEXOL 300 MG/ML  SOLN
100.0000 mL | Freq: Once | INTRAMUSCULAR | Status: AC | PRN
  Administered 2024-08-04: 50 mL via INTRAVENOUS

## 2024-08-04 MED ORDER — ENOXAPARIN SODIUM 40 MG/0.4ML IJ SOSY
40.0000 mg | PREFILLED_SYRINGE | INTRAMUSCULAR | Status: DC
  Administered 2024-08-05 – 2024-08-11 (×7): 40 mg via SUBCUTANEOUS
  Filled 2024-08-04 (×7): qty 0.4

## 2024-08-04 MED ORDER — FENTANYL CITRATE (PF) 100 MCG/2ML IJ SOLN
INTRAMUSCULAR | Status: AC
  Filled 2024-08-04: qty 2

## 2024-08-04 MED ORDER — MIDAZOLAM HCL 2 MG/2ML IJ SOLN
INTRAMUSCULAR | Status: AC | PRN
  Administered 2024-08-04: 1 mg via INTRAVENOUS
  Administered 2024-08-04: .5 mg via INTRAVENOUS

## 2024-08-04 MED ORDER — MIDAZOLAM HCL 2 MG/2ML IJ SOLN
INTRAMUSCULAR | Status: AC
  Filled 2024-08-04: qty 2

## 2024-08-04 MED ORDER — GELATIN ABSORBABLE 12-7 MM EX MISC
CUTANEOUS | Status: AC
  Filled 2024-08-04: qty 1

## 2024-08-04 MED ORDER — LIDOCAINE HCL (PF) 1 % IJ SOLN
10.0000 mL | Freq: Once | INTRAMUSCULAR | Status: AC
  Administered 2024-08-04: 10 mL
  Filled 2024-08-04: qty 10

## 2024-08-04 MED ORDER — FENTANYL CITRATE (PF) 100 MCG/2ML IJ SOLN
INTRAMUSCULAR | Status: AC | PRN
  Administered 2024-08-04 (×3): 25 ug via INTRAVENOUS

## 2024-08-04 NOTE — Plan of Care (Signed)
  Problem: Consults Goal: RH STROKE PATIENT EDUCATION Description: See Patient Education module for education specifics  Outcome: Progressing   Problem: RH SAFETY Goal: RH STG ADHERE TO SAFETY PRECAUTIONS W/ASSISTANCE/DEVICE Description: STG Adhere to Safety Precautions With cues Assistance/Device. Outcome: Progressing   Problem: RH KNOWLEDGE DEFICIT Goal: RH STG INCREASE KNOWLEDGE OF STROKE PROPHYLAXIS Description: Patient and family will be able to manage care/secondary risks at discharge using educational resources for medications and dietary modification independently Outcome: Progressing   Problem: RH KNOWLEDGE DEFICIT Goal: RH STG INCREASE KNOWLEDGE OF HYPERTENSION Description: Patient and family will be able to manage HTN at discharge using educational resources for medications and dietary modification independently Outcome: Progressing Goal: RH STG INCREASE KNOWLEGDE OF HYPERLIPIDEMIA Description: Patient and family will be able to manage HLD at discharge using educational resources for medications and dietary modification independently Outcome: Progressing

## 2024-08-04 NOTE — Plan of Care (Signed)
  Problem: RH SAFETY Goal: RH STG ADHERE TO SAFETY PRECAUTIONS W/ASSISTANCE/DEVICE Description: STG Adhere to Safety Precautions With cues Assistance/Device. Outcome: Not Progressing   Problem: RH KNOWLEDGE DEFICIT Goal: RH STG INCREASE KNOWLEDGE OF STROKE PROPHYLAXIS Description: Patient and family will be able to manage care/secondary risks at discharge using educational resources for medications and dietary modification independently Outcome: Not Progressing   Problem: RH KNOWLEDGE DEFICIT Goal: RH STG INCREASE KNOWLEDGE OF HYPERTENSION Description: Patient and family will be able to manage HTN at discharge using educational resources for medications and dietary modification independently Outcome: Not Progressing Goal: RH STG INCREASE KNOWLEGDE OF HYPERLIPIDEMIA Description: Patient and family will be able to manage HLD at discharge using educational resources for medications and dietary modification independently Outcome: Not Progressing

## 2024-08-04 NOTE — Sedation Documentation (Signed)
 Procedure ended at 1256. Per MD Philip: Bedrest for 3hrs (until 4pm) If primary team wants to restart heparin, may restart in 4hrs (5pm)

## 2024-08-04 NOTE — Procedures (Signed)
 Interventional Radiology Procedure:   Indications: Metastatic disease of unknown primary.   DVT and PE  Procedure: 1) Placement of IVC filter 2) US  guided liver lesion biopsy  Findings: Denali filter placed below renal veins.  3 core biopsies from a right hepatic lesion, Gelfoam slurry injected along biopsy tract.   Complications: None     EBL: Minimal  Plan: Bedrest 3 hours.   Tiffany Birkey R. Philip, MD  Pager: [phone]

## 2024-08-04 NOTE — Progress Notes (Signed)
 Reported with Therapy team this morning stomach discomfort. However, when asking patient about pain expressed pain with left IV site. Disoriented alert to name at this time. Fall mat next to bed due to patient as staff entering the room exiting the bed.

## 2024-08-04 NOTE — Progress Notes (Signed)
 Occupational Therapy Session Note  Patient Details  Name: Tiffany [name] MRN: [id number] Date of Birth: [birth year]  Today's Date: 08/04/2024 OT Individual Time: 9149-9054 OT Individual Time Calculation (min): 55 min    Short Term Goals: Week 1:  OT Short Term Goal 1 (Week 1): STGs=LTGs due to patient's estimated length of stay.  Skilled Therapeutic Interventions/Progress Updates:   Pt greeted supine in bed, pt agreeable to OT intervention.      Transfers/bed mobility/functional mobility:  Pt completed supine>sit with CGA. Stand pivot with no AD to w/c with CGA.   Therapeutic activity:  Pt completed seated sequencing task at BITS with pt instricted to reach out of BOS to touch numbers on screen 1-17. Pt needed initial visual demonstration to complete task but then able to sequence through numbers with MIN verbal cues and occasionally needing 2 options provided such as, is that 7 or 8? Pt would occasionally needed MIN cues to recall which number she was on. Pt completed task with 52% accuracy, pt completed task in 8 mins and 44 secs, 29.13 sec reaction time.    ADLs:  Grooming: pt completed seated grooming at sink with set- up assist.   IADLS:       Pt instructed to organize fruits and vegetable from sitting position. Pt able to state banana and watermelon correctly but unable to determine other items even with 2 choices provided.   Pt completed functional ambulation task in apt with pt instructed to ambulate around apt to place grocery items in cabinet. Pt completed task with no Ad and CGA, unsteadiness noted needing MIN A at times.   Ended session with pt supine in bed with all needs within reach and bed alarm activated.                    Therapy Documentation Precautions:  Precautions Precautions: Fall Recall of Precautions/Restrictions: Impaired Precaution/Restrictions Comments: Aphasia Restrictions Weight Bearing Restrictions Per Provider Order: No  Pain: pt  grimacing and holding stomach but verbally denies pain, provided rest breaks as needed.     Therapy/Group: Individual Therapy  Ronal Gift [facility] 08/04/2024, 12:08 PM

## 2024-08-04 NOTE — Progress Notes (Addendum)
 PROGRESS NOTE   Subjective/Complaints: Patient off the procedure during a.m. rounds.  Came back this afternoon, patient resting in bed, opens eyes and nods head no to discomfort. WBC uptrending from 16 to 18 today; hgb stable 8.4.  Platelets uptrending 529. No fevers, blood pressure borderline low, was having some tachypnea up to 24 today. Mostly continent of urine, last bowel movement recorded was 9-28 P.o. intakes recorded remain very poor, patient's daughter does endorse that she eats 100% of meals that they bring in from home.  They consistently bring in dinner, and intermittently lunch.  Unable to obtain ROS due to patient cognitive status.     Objective:   No results found.  Recent Labs    08/03/24 0538 08/04/24 0453  WBC 16.7* 18.3*  HGB 8.2* 8.4*  HCT 26.7* 27.7*  PLT 470* 529*   Recent Labs    08/03/24 0538  NA 138  K 3.9  CL 108  CO2 21*  GLUCOSE 85  BUN 15  CREATININE 0.84  CALCIUM  8.2*    Intake/Output Summary (Last 24 hours) at 08/04/2024 0946 Last data filed at 08/04/2024 0500 Gross per 24 hour  Intake 356.76 ml  Output --  Net 356.76 ml        Physical Exam: Vital Signs Blood pressure (!) 139/57, pulse 82, temperature 98 F (36.7 C), resp. rate 17, height 5' 2 (1.575 m), weight 55.4 kg, SpO2 99%.  Constitutional: No apparent distress. Appropriate appearance for age. Laying in bed, resting. HENT: No JVD. Neck Supple. Trachea midline. Atraumatic, normocephalic. Eyes: PERRLA. EOMI. Visual fields grossly intact.  Cardiovascular: RRR, systolic murmur . No Edema. Peripheral pulses 2+.   Respiratory: CTAB. No rales, rhonchi, or wheezing. On RA.  Abdomen: + bowel sounds, normoactive. No distention; diffusely tender on palpation Skin: C/D/I. No apparent lesions. MSK:      No apparent deformity.    Neurologic exam: Nods head yes/no appropriately on exam.  Moving all 4 extremities in bed.   Resting status post procedure.  Prior exams exams: Cognition: Assessment limited by both expressive and receptive aphasia; a little more alert today, speaking in intermittent sentences, 40% intelligible. Insight: Poor insight into current condition.  Mood: Subdued, flat Sensation: Equal and intact in BL UE and Les.  Reflexes:  Negative Hoffman's and babinski signs bilaterally.  CN: Mild R facial droop Coordination: No apparent tremors. No ataxia   Spasticity: MAS 0 in all extremities.  Strength: Moving all 4 extremities antigravity against resistance        Assessment/Plan: 1. Functional deficits which require 3+ hours per day of interdisciplinary therapy in a comprehensive inpatient rehab setting. Physiatrist is providing close team supervision and 24 hour management of active medical problems listed below. Physiatrist and rehab team continue to assess barriers to discharge/monitor patient progress toward functional and medical goals  Care Tool:  Bathing    Body parts bathed by patient: Right arm, Left arm, Chest, Abdomen, Front perineal area, Buttocks, Right upper leg, Left upper leg, Right lower leg, Left lower leg, Face         Bathing assist Assist Level: Contact Guard/Touching assist     Upper Body Dressing/Undressing Upper body  dressing   What is the patient wearing?: Pull over shirt    Upper body assist Assist Level: Set up assist    Lower Body Dressing/Undressing Lower body dressing      What is the patient wearing?: Underwear/pull up, Pants     Lower body assist Assist for lower body dressing: Contact Guard/Touching assist     Toileting Toileting    Toileting assist Assist for toileting: Contact Guard/Touching assist     Transfers Chair/bed transfer  Transfers assist     Chair/bed transfer assist level: Minimal Assistance - Patient > 75%     Locomotion Ambulation   Ambulation assist      Assist level: Minimal Assistance - Patient >  75% Assistive device: No Device Max distance: 180'   Walk 10 feet activity   Assist     Assist level: Minimal Assistance - Patient > 75% Assistive device: No Device   Walk 50 feet activity   Assist    Assist level: Minimal Assistance - Patient > 75% Assistive device: No Device    Walk 150 feet activity   Assist    Assist level: Minimal Assistance - Patient > 75% Assistive device: No Device    Walk 10 feet on uneven surface  activity   Assist     Assist level: Minimal Assistance - Patient > 75% Assistive device: Other (comment) (No device, but holding to RHR)   Wheelchair     Assist Is the patient using a wheelchair?: Yes Type of Wheelchair: Manual    Wheelchair assist level: Dependent - Patient 0%      Wheelchair 50 feet with 2 turns activity    Assist        Assist Level: Dependent - Patient 0%   Wheelchair 150 feet activity     Assist      Assist Level: Dependent - Patient 0%   Blood pressure (!) 139/57, pulse 82, temperature 98 F (36.7 C), resp. rate 17, height 5' 2 (1.575 m), weight 55.4 kg, SpO2 99%.  Medical Problem List and Plan: 1. 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 Onc, GI consulted. Called patient's daughter and left VM to discuss; did discuss workup with patient this AM as well. Close monitorring for neurologic changes given heparin ggt and risk for hemorrhagic conversion; planning to place IVC filter and DC AC   2.  DVT/Antithrombotics/PE : History of right popliteal DVT 9-9, started on Eliquis , repeat BLE dopplers done revealing acute DVT right popliteal, peroneal and posterior tib  veins 09/23  -DVT/anticoagulation:  Pharmaceutical: Eliquis --> heparin ggt -> IVC filter + ppx lovenox  10/3             -antiplatelet therapy: Per oncology and GI, would not recommend aspirin for stroke prophylaxis in setting of suspected GI bleed.   - 9-30: Given recent embolic stroke, continue Eliquis  for now.  May need to reduce dose to 2.5 mg if hemoglobin continues to downtrend.  Will get CTA to evaluate for PE given ongoing tachycardia, leukocytosis, and low-grade fevers with generalized chest/abdominal pain and history of DVT 9-9 and  9-23--would need this evaluated prior to adjustments of anticoagulation  - 10-1: CTA overnight with PE right upper and lower lobe, started on heparin ggt without bolus overnight - planning for IVC placement with heparin hold 4 hours prior- -10-2: IR may do IVC filter placement today or tomorrow; pharmacy informed, will resume heparin drip if procedure not planned by 5 PM - 10-3:  OR for IVC placement today.  Discussed with oncology Dr. Onesimo and patient's daughter, still some role for prophylactic dose Lovenox  for clot stabilization and prevention of extension of PE/DVTs.  Will place for tomorrow a.m., monitor CBC daily, DC Lovenox  if repeated transfusions required.  3. Pain Management: tylenol  prn.   - Would not schedule Tylenol  due to potential to mask fevers.   - 10/1: Tx as above; can now schedule tylenol  1000 mg TID --improved  4. Mood/Behavior/Sleep: LCSW to follow for evaluation and support.             --melatonin prn insomnia.              -antipsychotic agents: N\/A   - 9-30: Withdrawn, poor attention.  Start fluoxetine 20 mg daily for motor recovery and behavior.  Start sleep log.   - sleep log appropriate  5. Neuropsych/cognition: This patient is not capable of making decisions on her own behalf.   6. Skin/Wound Care: Routine pressure relief measures.  7. Fluids/Electrolytes/Nutrition: Monitor I/O. Check CMET in am.    -9-30: A.m. labs stable  -  10-2: Per daughter, eating meals brought in from home, monitor weights weekly  8.   Acute on chronic anemia: Monitor for signs of bleeding. H/H stable 6.1-->8.7.  FOBT -9-23. --add iron supplement as Iron level <10.  -  9-30: Hemoglobin downtrending, 7.3.  No obvious source of bleeding.  Added FOBT today.  Repeat H&H in AM; may need to transfuse if any lower.  Continue Eliquis  at current dose but if signs of bleeding may need to discuss adjusting to 2.5 mg daily. 10-1: Hemoglobin 7.1, on heparin drip. Monitor daily. Transfusion 1 U PRBC ordered; repeat h/h this evening. Holley Schmitz discussed with patient's daughter over the phone.  10/2: Hgb responses 8.0; monitor - ?transition to oral ASA +/- IVC filter to assist in reducing bleed, will d/w GI and Onc today 10-3: Hemoglobin stable 8.4; was off of heparin drip most of yesterday.  After IVC placement today + ppx lovenox  as above. Monitor daily. NO ASA.   9.  Resting tachycardia: HR ranging in 110-120 at rest. CTA to rule out PE?             -pt is not in distress, breathing comfortably, no chest pain, on eliquis              --Afebrile X 24 hours.   --Monitor for symptoms with increase in activity.  9-30: Remained in the 90s overnight and throughout therapies this a.m.; coming up in the 110s this afternoon--stable 10/1-3  10. Leukocytosis: WBC on upward trend--16.0-->14.5-->16.3              -however, pt is afebrile, urine/chest clear, on full a/c --Sepsis work up prn temp elevation 9-30: WBCs downtrending 14.6--have been stable at approximately this level since 9-24.  No fevers, chills, obvious lines of infection.  Is complaining of some chest pain and temperature 100 this afternoon; CTA ordered as above -10/1: Right upper lower lobe PE, along with multiple intra-abdominal masses concerning for metastatic cancer; most likely source. Tx PE as above and  monitor.  - 10/2 stable 14-16 10-3: Increased to 18.  Patient with expressive aphasia, difficulty  performing review of systems, complicated by cancer, multiple blood clots, and steroids.  Will get urinalysis.  11.  Elevated ALP, other LFTs are normal.  Trend.  12.  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  CT A/p 9/30 with: IMPRESSION: 1. Multiple hepatic masses consistent with metastases. The most likely primary is gastric given the circumferentially thickened mid to distal stomach, and hypogastric and gastrohepatic ligament adenopathy. 2. 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 3. Small amount of pelvic ascites.  - Called patient's daughter as above, left voicemail to review results. - Oncology consult placed for today; GI consult also placed for biopsy.   - 10/2: CEA, CA-19 pending. Daughter declined EGD for biopsy this AM. Per Oncology, may be able to perform liver biopsy for pathology; will follow up with Dr Lanny.   - 10/3: Liver biopsy performed today; pathology pending.  Appreciate Dr. 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  Plan to continue rehab and reassess functional gains closer to discharge.  12.  Constipation.  Last bowel movement recorded 9-12.  Has been eating very little.  Will get KUB today.   LOS: 4 days A FACE TO FACE EVALUATION WAS PERFORMED  Joesph JAYSON Likes 08/04/2024, 9:46 AM

## 2024-08-04 NOTE — Progress Notes (Signed)
 Physical Therapy Note  Patient Details  Name: Tiffany [name] MRN: [id number] Date of Birth: [birth year] Today's Date: 08/04/2024    Pt unavailable and nursing states pt off floor for IVC filter placement and liver biopsy.  Missed time of 75 min.   [name] P Zalaya Astarita 08/04/2024, 11:01 AM

## 2024-08-05 DIAGNOSIS — I63512 Cerebral infarction due to unspecified occlusion or stenosis of left middle cerebral artery: Secondary | ICD-10-CM | POA: Diagnosis not present

## 2024-08-05 LAB — URINALYSIS, ROUTINE W REFLEX MICROSCOPIC
Bacteria, UA: NONE SEEN
Bilirubin Urine: NEGATIVE
Glucose, UA: NEGATIVE mg/dL
Hgb urine dipstick: NEGATIVE
Ketones, ur: 5 mg/dL — AB
Nitrite: NEGATIVE
Protein, ur: 30 mg/dL — AB
Specific Gravity, Urine: 1.042 — ABNORMAL HIGH (ref 1.005–1.030)
pH: 5 (ref 5.0–8.0)

## 2024-08-05 LAB — CBC
HCT: 26.9 % — ABNORMAL LOW (ref 36.0–46.0)
Hemoglobin: 8.3 g/dL — ABNORMAL LOW (ref 12.0–15.0)
MCH: 25.3 pg — ABNORMAL LOW (ref 26.0–34.0)
MCHC: 30.9 g/dL (ref 30.0–36.0)
MCV: 82 fL (ref 80.0–100.0)
Platelets: 442 K/uL — ABNORMAL HIGH (ref 150–400)
RBC: 3.28 MIL/uL — ABNORMAL LOW (ref 3.87–5.11)
RDW: 19.3 % — ABNORMAL HIGH (ref 11.5–15.5)
WBC: 18.9 K/uL — ABNORMAL HIGH (ref 4.0–10.5)
nRBC: 0 % (ref 0.0–0.2)

## 2024-08-05 MED ORDER — SODIUM CHLORIDE 0.9 % IV SOLN
2.0000 g | INTRAVENOUS | Status: DC
  Administered 2024-08-05 – 2024-08-08 (×4): 2 g via INTRAVENOUS
  Filled 2024-08-05 (×4): qty 20

## 2024-08-05 NOTE — Progress Notes (Signed)
 Speech Language Pathology Daily Session Note  Patient Details  Name: Tiffany [name] MRN: [id number] Date of Birth: [birth year]  Today's Date: 08/05/2024 SLP Individual Time: 1000-1047 SLP Individual Time Calculation (min): 47 min SLP Missed Time: 13 min Missed Time Reason: Patient unwilling to participate  Short Term Goals: Week 1: SLP Short Term Goal 1 (Week 1): STGs = LTGs d/t ELOS  Skilled Therapeutic Interventions: SLP conducted skilled therapy session targeting communication goals. Upon entry, patient receiving medications from nursing staff and direct handoff to SLP was completed. RN reports patient declined breakfast, though when prompted re: drinking milk, patient responded yes, I will have this one. Additionally, patient formed several spontaneous and appropriate questions during therapy tasks including Why are we doing this? And what is this for? She required max encouragement to participate across tasks. Patient reports head and abdominal pain throughout session, thus SLP utilized mental rest breaks to assist with fatigue and pain management. During therapy tasks, patient identified items based on color from a fo8 given mod to max assist and required max assist to identify pictured items from a fo2. SLP then facilitated written communication tasks where patient wrote name given a model and max cues. SLP continued with calendar work initiated in previous session; patient benefited from max assist and verbal models to state month and pointed to correct date on calendar given min assist. With 13 minutes remaining in session, patient suddenly states Leave me alone, leave me alone, please leave. 13 minutes of skilled time missed due to decreased participation and direct request to cease session. Patient was left in room with call bell in reach and alarm set. SLP will continue to target goals per plan of care.        Pain Pain Assessment Pain Scale: Faces Faces Pain Scale: Hurts even  more Pain Location: Head  Therapy/Group: Individual Therapy  Amyla Heffner, M.A., CCC-SLP  Adalynne Steffensmeier A Hila Bolding 08/05/2024, 10:56 AM

## 2024-08-05 NOTE — Progress Notes (Signed)
 PROGRESS NOTE   Subjective/Complaints:  Pt is curled up in bed; nonverbal but made some vocalizations   Per staff, had HR of 120 at 6am and T 100.9- on MEWS yellow of 3;  Feels a little warm- has had tylenol .   Pt shook head no about LBM.    Unable to obtain ROS due to patient cognitive status Objective:   IR IVC FILTER PLMT / S&I PORTER GUID/MOD SED Result Date: 08/04/2024 INDICATION: 83 year old with evidence for intra-abdominal neoplastic disease. 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 Request for a IVC filter because the patient is not a good anticoagulation candidate. EXAM: IVC FILTER PLACEMENT; IVC VENOGRAM; ULTRASOUND FOR VASCULAR ACCESS Physician: Juliene SAUNDERS. Henn, MD MEDICATIONS: Moderate sedation ANESTHESIA/SEDATION: Moderate (conscious) sedation was employed during this procedure. A total of Versed 0.5 mg and fentanyl 25 mcg was administered intravenously at the order of the provider performing the procedure. Total intra-service moderate sedation time: 15 minutes. Patient's level of consciousness and vital signs were monitored continuously by radiology nurse throughout the procedure under the supervision of the provider performing the procedure. CONTRAST:  50 mL Omnipaque 300 FLUOROSCOPY: Radiation Exposure Index (as provided by the fluoroscopic device): 14 mGy Kerma COMPLICATIONS: None immediate. PROCEDURE: Informed consent was obtained for an IVC venogram and filter placement. Ultrasound demonstrated a patent right internal jugular vein. Ultrasound images were obtained for documentation. The right neck was prepped and draped in a sterile fashion. Maximal barrier sterile technique was utilized including caps, mask, sterile gowns, sterile gloves, sterile drape, hand hygiene and skin antiseptic. The skin was anesthetized with 1% lidocaine. A 21 gauge needle was directed into the vein with ultrasound  guidance and a micropuncture dilator set was placed. A wire was advanced into the IVC. The filter sheath was advanced over the wire into the IVC. An IVC venogram was performed. Fluoroscopic images were obtained for documentation. A Bard Denali filter was deployed below the lowest renal vein. A follow-up venogram was performed and the vascular sheath was removed with manual compression. FINDINGS: IVC was patent. Bilateral renal veins were identified. The filter was deployed below the lowest renal vein. Follow-up venogram confirmed placement within the IVC and below the renal veins. IMPRESSION: Successful placement of a retrievable IVC filter. PLAN: Due to patient related comorbidities and/or clinical necessity, this IVC filter should be considered a permanent device. This patient will not be actively followed for future filter retrieval. Electronically Signed   By: Juliene Balder M.D.   On: 08/04/2024 16:13   IR US  LIVER BIOPSY Result Date: 08/04/2024 INDICATION: 83 year old with liver lesions and needs a tissue diagnosis. EXAM: ULTRASOUND-GUIDED LIVER LESION BIOPSY MEDICATIONS: Moderate sedation ANESTHESIA/SEDATION: Moderate (conscious) sedation was employed during this procedure. A total of Versed 0.5 mg and Fentanyl 50 mcg was administered intravenously by the radiology nurse. Total intra-service moderate Sedation Time: 18 minutes. The patient's level of consciousness and vital signs were monitored continuously by radiology nursing throughout the procedure under my direct supervision. FLUOROSCOPY TIME:  None COMPLICATIONS: None immediate. PROCEDURE: Informed written consent was obtained from the patient/family after a thorough discussion of the procedural risks, benefits and alternatives. All questions were  addressed. Maximal Sterile Barrier Technique was utilized including caps, mask, sterile gowns, sterile gloves, sterile drape, hand hygiene and skin antiseptic. A timeout was performed prior to the initiation of  the procedure. Abdomen was evaluated with ultrasound. A right hepatic lesion was identified for biopsy. The right side of the abdomen was prepped with chlorhexidine and sterile field was created. Skin was anesthetized using 1% lidocaine. Small incision was made. Using ultrasound guidance, 17 gauge coaxial needle was directed into the right hepatic lesion. Total of 3 core biopsies were performed with an 18 gauge core device. Three adequate specimens obtained and placed in formalin. Gel-Foam slurry was injected as the 17 gauge needle was removed. Bandage placed over the puncture site. FINDINGS: Heterogeneous hepatic lesions. A large lesion in the right hepatic lobe with central necrosis was biopsied. 3 adequate specimens were obtained. No immediate bleeding or hematoma formation. IMPRESSION: Ultrasound-guided core biopsy of a right hepatic lesion. Electronically Signed   By: Juliene Balder M.D.   On: 08/04/2024 16:07   DG Abd 1 View Result Date: 08/04/2024 CLINICAL DATA:  Constipation. EXAM: ABDOMEN - 1 VIEW COMPARISON:  CT 08/01/2024 FINDINGS: No bowel dilatation or evidence of obstruction. Small to moderate volume of formed stool throughout the colon, diminished stool burden from prior CT. Calcifications in the right pelvis correspond to retroperitoneal phleboliths. Overlying artifacts project over the pelvis. IMPRESSION: Small to moderate volume of formed stool throughout the colon, diminished stool burden from prior CT. Electronically Signed   By: Andrea Gasman M.D.   On: 08/04/2024 13:54    Recent Labs    08/04/24 0453 08/05/24 0754  WBC 18.3* 18.9*  HGB 8.4* 8.3*  HCT 27.7* 26.9*  PLT 529* 442*   Recent Labs    08/03/24 0538  NA 138  K 3.9  CL 108  CO2 21*  GLUCOSE 85  BUN 15  CREATININE 0.84  CALCIUM 8.2*   No intake or output data in the 24 hours ending 08/05/24 1041       Physical Exam: Vital Signs Blood pressure 110/71, pulse (!) 120, temperature (!) 100.9 F (38.3 C),  temperature source Oral, resp. rate 16, height 5' 2 (1.575 m), weight 55.4 kg, SpO2 96%.    General: sleepy- curled up in bed; NAD HENT: conjugate gaze; oropharynx dry CV: regular rhythm, tachycardic (to 110s) rate; no JVD Pulmonary: decreased throughout- no W/R/R GI: soft, NT, ND, (+)BS- thin Psychiatric: sleepy, flat Neurological: aphasic- sleepy- but tried to talk a little-didn't make sense Skin: C/D/I. No apparent lesions. MSK:      No apparent deformity.    Neurologic exam: Nods head yes/no appropriately on exam.  Moving all 4 extremities in bed.  Resting status post procedure.  Prior exams exams: Cognition: Assessment limited by both expressive and receptive aphasia; a little more alert today, speaking in intermittent sentences, 40% intelligible. Insight: Poor insight into current condition.  Mood: Subdued, flat Sensation: Equal and intact in BL UE and Les.  Reflexes:  Negative Hoffman's and babinski signs bilaterally.  CN: Mild R facial droop Coordination: No apparent tremors. No ataxia   Spasticity: MAS 0 in all extremities.  Strength: Moving all 4 extremities antigravity against resistance        Assessment/Plan: 1. Functional deficits which require 3+ hours per day of interdisciplinary therapy in a comprehensive inpatient rehab setting. Physiatrist is providing close team supervision and 24 hour management of active medical problems listed below. Physiatrist and rehab team continue to assess barriers to discharge/monitor patient  progress toward functional and medical goals  Care Tool:  Bathing    Body parts bathed by patient: Right arm, Left arm, Chest, Abdomen, Front perineal area, Buttocks, Right upper leg, Left upper leg, Right lower leg, Left lower leg, Face         Bathing assist Assist Level: Contact Guard/Touching assist     Upper Body Dressing/Undressing Upper body dressing   What is the patient wearing?: Pull over shirt    Upper body assist  Assist Level: Set up assist    Lower Body Dressing/Undressing Lower body dressing      What is the patient wearing?: Underwear/pull up, Pants     Lower body assist Assist for lower body dressing: Contact Guard/Touching assist     Toileting Toileting    Toileting assist Assist for toileting: Contact Guard/Touching assist     Transfers Chair/bed transfer  Transfers assist     Chair/bed transfer assist level: Minimal Assistance - Patient > 75%     Locomotion Ambulation   Ambulation assist      Assist level: Minimal Assistance - Patient > 75% Assistive device: No Device Max distance: 180'   Walk 10 feet activity   Assist     Assist level: Minimal Assistance - Patient > 75% Assistive device: No Device   Walk 50 feet activity   Assist    Assist level: Minimal Assistance - Patient > 75% Assistive device: No Device    Walk 150 feet activity   Assist    Assist level: Minimal Assistance - Patient > 75% Assistive device: No Device    Walk 10 feet on uneven surface  activity   Assist     Assist level: Minimal Assistance - Patient > 75% Assistive device: Other (comment) (No device, but holding to RHR)   Wheelchair     Assist Is the patient using a wheelchair?: Yes Type of Wheelchair: Manual    Wheelchair assist level: Dependent - Patient 0%      Wheelchair 50 feet with 2 turns activity    Assist        Assist Level: Dependent - Patient 0%   Wheelchair 150 feet activity     Assist      Assist Level: Dependent - Patient 0%   Blood pressure 110/71, pulse (!) 120, temperature (!) 100.9 F (38.3 C), temperature source Oral, resp. rate 16, height 5' 2 (1.575 m), weight 55.4 kg, SpO2 96%.  Medical Problem List and Plan: 1. 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 Onc, GI consulted. Called patient's daughter and left VM to discuss; did discuss workup with patient this AM as well. Close monitorring for neurologic changes given heparin ggt and risk for hemorrhagic conversion; planning to place IVC filter and DC AC   2.  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   - 9-30: Given recent embolic stroke, continue Eliquis  for now.  May need to reduce dose to 2.5 mg if hemoglobin continues to downtrend.  Will get CTA to evaluate for PE given ongoing tachycardia, leukocytosis, and low-grade fevers with generalized chest/abdominal pain and history of DVT 9-9 and 9-23--would need this evaluated prior to adjustments of anticoagulation  - 10-1: CTA overnight with PE right upper and lower lobe, started on heparin ggt without bolus overnight - planning for IVC placement with heparin hold 4 hours prior- -10-2: IR may do IVC filter placement today or tomorrow; pharmacy informed, will resume heparin drip if procedure not planned by 5 PM - 10-3:  OR for IVC placement today.  Discussed with oncology Dr. Onesimo and patient's daughter, still some role for prophylactic dose Lovenox  for clot stabilization and prevention of extension of PE/DVTs.  Will place for tomorrow a.m., monitor CBC daily, DC Lovenox  if repeated transfusions required.  3. Pain Management: tylenol  prn.   - Would not schedule Tylenol  due to potential to mask  fevers.   - 10/1: Tx as above; can now schedule tylenol  1000 mg TID --improved  4. Mood/Behavior/Sleep: LCSW to follow for evaluation and support.             --melatonin prn insomnia.              -antipsychotic agents: N\/A   - 9-30: Withdrawn, poor attention.  Start fluoxetine 20 mg daily for motor recovery and behavior.  Start sleep log.   - sleep log appropriate  5. Neuropsych/cognition: This patient is not capable of making decisions on her own behalf.   6. Skin/Wound Care: Routine pressure relief measures.  7. Fluids/Electrolytes/Nutrition: Monitor I/O. Check CMET in am.    -9-30: A.m. labs stable  - 10-2: Per daughter, eating meals brought in from home, monitor weights weekly  8.   Acute on chronic anemia: Monitor for signs of bleeding. H/H stable 6.1-->8.7.  FOBT -9-23. --add iron supplement as Iron level <10.  -  9-30: Hemoglobin downtrending, 7.3.  No obvious source of bleeding.  Added FOBT today.  Repeat H&H in AM; may need to transfuse if any lower.  Continue Eliquis  at current dose but if signs of bleeding may need to discuss adjusting to 2.5 mg daily. 10-1: Hemoglobin 7.1, on heparin drip. Monitor daily. Transfusion 1 U PRBC ordered; repeat h/h this evening. Holley Schmitz discussed with patient's daughter over the phone.  10/2: Hgb responses 8.0; monitor - ?transition to oral ASA +/- IVC filter to assist in reducing bleed, will d/w GI and Onc today 10-3: Hemoglobin stable 8.4; was off of heparin drip most of yesterday.  After IVC placement today + ppx lovenox  as above. Monitor daily. NO ASA.  10/4- Hb stable at 8.3 9.  Resting tachycardia: HR ranging in 110-120 at rest. CTA to rule out PE?             -pt is not in distress, breathing comfortably, no chest pain, on eliquis              --Afebrile X 24 hours.   --Monitor for symptoms with increase in activity.  9-30: Remained in the 90s overnight and throughout therapies this a.m.; coming up in the 110s this afternoon--stable  10/1-3  10. Leukocytosis: WBC on upward trend--16.0-->14.5-->16.3              -  however, pt is afebrile, urine/chest clear, on full a/c --Sepsis work up prn temp elevation 9-30: WBCs downtrending 14.6--have been stable at approximately this level since 9-24.  No fevers, chills, obvious lines of infection.  Is complaining of some chest pain and temperature 100 this afternoon; CTA ordered as above -10/1: Right upper lower lobe PE, along with multiple intra-abdominal masses concerning for metastatic cancer; most likely source. Tx PE as above and monitor.  - 10/2 stable 14-16 10-3: Increased to 18.  Patient with expressive aphasia, difficulty performing review of systems, complicated by cancer, multiple blood clots, and steroids.  Will get urinalysis. 10/4- Per #13- due to new fever and WBC is 18.9 today- up slightly from 18.3 11.  Elevated ALP, other LFTs are normal.  Trend.  12.  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  CT A/p 9/30 with: IMPRESSION: 1. Multiple hepatic masses consistent with metastases. The most likely primary is gastric given the circumferentially thickened mid to distal stomach, and hypogastric and gastrohepatic ligament adenopathy. 2. 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 3. Small amount of pelvic ascites.  - Called patient's daughter as above, left voicemail to review results. - Oncology consult placed for today; GI consult also placed for biopsy.   - 10/2: CEA, CA-19 pending. Daughter declined EGD for biopsy this AM. Per Oncology, may be able to perform liver biopsy for pathology; will follow up with Dr Lanny.   - 10/3: Liver biopsy performed today; pathology pending.  Appreciate Dr. 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  Plan to continue rehab and reassess functional gains closer to discharge.  10/4- Biopsy results pending 12.  Constipation.  Last bowel movement  recorded 9-12.  Has been eating very little.  Will get KUB today.  10/3- Per pt's daughter, LBM was Wed/Th- and was soft- doesn't want me to give additional bowel meds  13. Fever/Leukocytosis  10/4- Ordered another U/A and Cx and Blood Cx's after d/w ID- T was 100.9- so started Rocephin 2 grams q 24 hours per ID request and will follw Cx's and clinically. Also gave tylenol  and on yellow MEWS.  14. Tachycardia  10/4- see #13.    I spent a total of 59   minutes on total care today- >50% coordination of care- due to  D/w Nursing, PA, ID and lengthy d/w daughter about medical issues  LOS: 5 days A FACE TO FACE EVALUATION WAS PERFORMED  Oziel Beitler 08/05/2024, 10:41 AM

## 2024-08-05 NOTE — ED Provider Notes (Signed)
 [facility] Provider Note   CSN: [id number] Arrival date & time: 07/22/24  2137     Patient presents with: ams   Tiffany [name] is a 83 y.o. female.   Most of the history obtained from the patient's daughter who is a Designer, jewellery.  Patient has multiple medical problems, recently diagnosed with a DVT and started Eliquis  a couple weeks ago.  Daughter states she was normal approximately 24 to 30 hours ago when she last talked to her.  When the daughter called her on the phone she was not making any sense.  She went to see her and thought she might have a facial droop and brought her here for further evaluation.  Patient has not noticed any dark stools or blood in her stools.        Prior to Admission medications   Medication Sig Start Date End Date Taking? Authorizing Provider  apixaban  (ELIQUIS ) 5 MG TABS tablet Take 5 mg by mouth 2 (two) times daily.   Yes [provider]  dorzolamide (TRUSOPT) 2 % ophthalmic solution Place 1 drop into both eyes 3 (three) times daily. 06/24/24  Yes [provider]  LUMIGAN 0.01 % SOLN Place 1 drop into both eyes at bedtime. 07/14/24  Yes [provider]  mirtazapine (REMERON) 7.5 MG tablet Take 7.5 mg by mouth at bedtime.   Yes [provider]  PREBIOTIC PRODUCT PO Take 1 capsule by mouth daily.   Yes [provider]  predniSONE (DELTASONE) 5 MG tablet Take 5 mg by mouth daily. 04/25/24  Yes [provider]  Probiotic Product (PROBIOTIC PO) Take 1 capsule by mouth daily.   Yes [provider]  alendronate (FOSAMAX) 70 MG tablet Take 70 mg by mouth once a week. 09/09/23   [provider]  atorvastatin (LIPITOR) 20 MG tablet Take 1 tablet (20 mg total) by mouth daily. 08/01/24 08/31/24  Pahwani, Ravi, MD  bimatoprost (LUMIGAN) 0.03 % ophthalmic solution Place 1 drop into both eyes at bedtime.    [provider]  D3-50 1.25 MG (50000 UT) capsule Take  50,000 Units by mouth once a week. On mondays 09/09/23   [provider]  metoprolol tartrate (LOPRESSOR) 50 MG tablet Take 50 mg by mouth daily. 09/09/23   [provider]    Allergies: Elemental sulfur, Shellfish allergy, Shellfish allergy, and Sulfa antibiotics    Review of Systems  Updated Vital Signs BP 103/68 (BP Location: Left Arm)   Pulse 94   Temp 98.7 F (37.1 C)   Resp 19   Ht 5' (1.524 m)   Wt 59 kg   SpO2 100%   BMI 25.39 kg/m   Physical Exam Vitals and nursing note reviewed.  Constitutional:      Appearance: She is well-developed.  HENT:     Head: Normocephalic and atraumatic.  Eyes:     Pupils: Pupils are equal, round, and reactive to light.     Comments: Pale conjunctiva  Cardiovascular:     Rate and Rhythm: Normal rate and regular rhythm.  Pulmonary:     Effort: No respiratory distress.     Breath sounds: No stridor.  Abdominal:     General: There is no distension.  Musculoskeletal:     Cervical back: Normal range of motion.  Skin:    General: Skin is warm and dry.  Neurological:     General: No focal deficit present.     Mental Status: She is alert.     (  all labs ordered are listed, but only abnormal results are displayed) Labs Reviewed  COMPREHENSIVE METABOLIC PANEL WITH GFR - Abnormal; Notable for the following components:      Result Value   CO2 18 (*)    Calcium 8.7 (*)    Albumin 2.7 (*)    Alkaline Phosphatase 149 (*)    All other components within normal limits  CBC - Abnormal; Notable for the following components:   WBC 16.0 (*)    RBC 2.55 (*)    Hemoglobin 5.6 (*)    HCT 19.4 (*)    MCV 76.1 (*)    MCH 22.0 (*)    MCHC 28.9 (*)    RDW 16.8 (*)    Platelets 597 (*)    All other components within normal limits  URINALYSIS, ROUTINE W REFLEX MICROSCOPIC - Abnormal; Notable for the following components:   APPearance HAZY (*)    Specific Gravity, Urine >1.030 (*)    Bilirubin Urine SMALL (*)    Ketones, ur 40  (*)    Protein, ur 100 (*)    Leukocytes,Ua SMALL (*)    All other components within normal limits  URINALYSIS, MICROSCOPIC (REFLEX) - Abnormal; Notable for the following components:   Bacteria, UA MANY (*)    All other components within normal limits  VITAMIN B12 - Abnormal; Notable for the following components:   Vitamin B-12 1,321 (*)    All other components within normal limits  IRON AND TIBC - Abnormal; Notable for the following components:   Iron <10 (*)    All other components within normal limits  RETICULOCYTES - Abnormal; Notable for the following components:   RBC. 2.40 (*)    Immature Retic Fract 29.1 (*)    All other components within normal limits  CBC WITH DIFFERENTIAL/PLATELET - Abnormal; Notable for the following components:   WBC 15.0 (*)    RBC 3.59 (*)    Hemoglobin 8.9 (*)    HCT 28.3 (*)    MCV 78.8 (*)    MCH 24.8 (*)    RDW 16.3 (*)    Platelets 446 (*)    Neutro Abs 10.5 (*)    Monocytes Absolute 1.7 (*)    Abs Immature Granulocytes 0.20 (*)    All other components within normal limits  COMPREHENSIVE METABOLIC PANEL WITH GFR - Abnormal; Notable for the following components:   CO2 20 (*)    Calcium 8.2 (*)    Albumin 2.3 (*)    Alkaline Phosphatase 141 (*)    All other components within normal limits  PROTIME-INR - Abnormal; Notable for the following components:   Prothrombin Time 17.2 (*)    INR 1.3 (*)    All other components within normal limits  CBC WITH DIFFERENTIAL/PLATELET - Abnormal; Notable for the following components:   WBC 13.6 (*)    RBC 3.48 (*)    Hemoglobin 8.6 (*)    HCT 26.5 (*)    MCV 76.1 (*)    MCH 24.7 (*)    RDW 16.6 (*)    Platelets 410 (*)    nRBC 0.4 (*)    Neutro Abs 8.9 (*)    Monocytes Absolute 1.8 (*)    Abs Immature Granulocytes 0.18 (*)    All other components within normal limits  CBC WITH DIFFERENTIAL/PLATELET - Abnormal; Notable for the following components:   WBC 14.5 (*)    RBC 3.69 (*)    Hemoglobin  9.2 (*)    HCT  28.6 (*)    MCV 77.5 (*)    MCH 24.9 (*)    RDW 17.3 (*)    Platelets 419 (*)    Neutro Abs 10.2 (*)    Monocytes Absolute 1.8 (*)    Abs Immature Granulocytes 0.21 (*)    All other components within normal limits  BASIC METABOLIC PANEL WITH GFR - Abnormal; Notable for the following components:   CO2 21 (*)    Glucose, Bld 100 (*)    Calcium 8.6 (*)    All other components within normal limits  BASIC METABOLIC PANEL WITH GFR - Abnormal; Notable for the following components:   CO2 21 (*)    Glucose, Bld 110 (*)    Calcium 8.8 (*)    All other components within normal limits  CBC WITH DIFFERENTIAL/PLATELET - Abnormal; Notable for the following components:   WBC 14.5 (*)    RBC 3.46 (*)    Hemoglobin 8.4 (*)    HCT 27.5 (*)    MCV 79.5 (*)    MCH 24.3 (*)    RDW 17.9 (*)    Neutro Abs 9.3 (*)    Monocytes Absolute 1.9 (*)    Abs Immature Granulocytes 0.17 (*)    All other components within normal limits  CBC WITH DIFFERENTIAL/PLATELET - Abnormal; Notable for the following components:   WBC 14.7 (*)    RBC 3.65 (*)    Hemoglobin 8.9 (*)    HCT 29.2 (*)    MCH 24.4 (*)    RDW 18.1 (*)    Neutro Abs 10.0 (*)    Monocytes Absolute 1.8 (*)    Abs Immature Granulocytes 0.20 (*)    All other components within normal limits  CBC WITH DIFFERENTIAL/PLATELET - Abnormal; Notable for the following components:   WBC 14.9 (*)    RBC 3.42 (*)    Hemoglobin 8.4 (*)    HCT 27.2 (*)    MCV 79.5 (*)    MCH 24.6 (*)    RDW 18.0 (*)    Neutro Abs 9.6 (*)    Monocytes Absolute 2.0 (*)    Abs Immature Granulocytes 0.14 (*)    All other components within normal limits  CBC WITH DIFFERENTIAL/PLATELET - Abnormal; Notable for the following components:   WBC 14.7 (*)    RBC 3.47 (*)    Hemoglobin 8.4 (*)    HCT 27.5 (*)    MCV 79.3 (*)    MCH 24.2 (*)    RDW 18.0 (*)    Neutro Abs 9.5 (*)    Monocytes Absolute 2.1 (*)    Abs Immature Granulocytes 0.19 (*)    All other  components within normal limits  CBC WITH DIFFERENTIAL/PLATELET - Abnormal; Notable for the following components:   WBC 16.3 (*)    RBC 3.54 (*)    Hemoglobin 8.7 (*)    HCT 27.6 (*)    MCV 78.0 (*)    MCH 24.6 (*)    RDW 18.1 (*)    Neutro Abs 10.8 (*)    Monocytes Absolute 2.3 (*)    Abs Immature Granulocytes 0.22 (*)    All other components within normal limits  BASIC METABOLIC PANEL WITH GFR - Abnormal; Notable for the following components:   CO2 19 (*)    Glucose, Bld 102 (*)    Calcium 8.6 (*)    All other components within normal limits  I-STAT CHEM 8, ED - Abnormal; Notable for the following components:  TCO2 18 (*)    Hemoglobin 6.1 (*)    HCT 18.0 (*)    All other components within normal limits  FOLATE  FERRITIN  HEMOGLOBIN A1C  LIPID PANEL  CBG MONITORING, ED  POC OCCULT BLOOD, ED  POC OCCULT BLOOD, ED  PREPARE RBC (CROSSMATCH)  TYPE AND SCREEN  ABO/RH  BLOOD TRANSFUSION REPORT - SCANNED   Narrative:    Ordered by an unspecified provider.    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 Request for a IVC filter because the patient is not a good anticoagulation candidate. EXAM: IVC FILTER PLACEMENT; IVC VENOGRAM; ULTRASOUND FOR VASCULAR ACCESS Physician: Juliene SAUNDERS. Henn, MD MEDICATIONS: Moderate sedation ANESTHESIA/SEDATION: Moderate (conscious) sedation was employed during this procedure. A total of Versed 0.5  mg and fentanyl 25 mcg was administered intravenously at the order of the provider performing the procedure. Total intra-service moderate sedation time: 15 minutes. Patient's level of consciousness and vital signs were monitored continuously by radiology nurse throughout the procedure under the supervision of the provider performing the procedure. CONTRAST:  50 mL Omnipaque 300 FLUOROSCOPY: Radiation Exposure Index (as provided by the fluoroscopic device): 14 mGy Kerma COMPLICATIONS: None immediate. PROCEDURE: Informed consent was obtained for an IVC venogram and filter placement. Ultrasound demonstrated a patent right internal jugular vein. Ultrasound images were obtained for documentation. The right neck was prepped and draped in a sterile fashion. Maximal barrier sterile technique was utilized including caps, mask, sterile gowns, sterile gloves, sterile drape, hand hygiene and skin antiseptic. The skin was anesthetized with 1% lidocaine. A 21 gauge needle was directed into the vein with ultrasound guidance and a micropuncture dilator set was placed. A wire was advanced into the IVC. The filter sheath was advanced over the wire into the IVC. An IVC venogram was performed. Fluoroscopic images were obtained for documentation. A Bard Denali filter was deployed below the lowest renal vein. A follow-up venogram was performed and the vascular sheath was removed with manual compression. FINDINGS: IVC was patent. Bilateral renal veins were identified. The filter was deployed below the lowest renal vein. Follow-up venogram confirmed placement within the IVC and below the renal veins. IMPRESSION: Successful placement of a retrievable IVC filter. PLAN: Due to patient related comorbidities and/or clinical necessity, this IVC filter should be considered a permanent device. This patient will not be actively followed for future filter retrieval. Electronically Signed   By: Juliene Balder M.D.   On: 08/04/2024 16:13   IR US  LIVER  BIOPSY Result Date: 08/04/2024 INDICATION: 83 year old with liver lesions and needs a tissue diagnosis. EXAM: ULTRASOUND-GUIDED LIVER LESION BIOPSY MEDICATIONS: Moderate sedation ANESTHESIA/SEDATION: Moderate (conscious) sedation was employed during this procedure. A total of Versed 0.5 mg and Fentanyl 50 mcg was administered intravenously by the radiology nurse. Total intra-service moderate Sedation Time: 18 minutes. The patient's level of consciousness and vital signs were monitored continuously by radiology nursing throughout the procedure under my direct supervision. FLUOROSCOPY TIME:  None COMPLICATIONS: None immediate. PROCEDURE: Informed  written consent was obtained from the patient/family after a thorough discussion of the procedural risks, benefits and alternatives. All questions were addressed. Maximal Sterile Barrier Technique was utilized including caps, mask, sterile gowns, sterile gloves, sterile drape, hand hygiene and skin antiseptic. A timeout was performed prior to the initiation of the procedure. Abdomen was evaluated with ultrasound. A right hepatic lesion was identified for biopsy. The right side of the abdomen was prepped with chlorhexidine and sterile field was created. Skin was anesthetized using 1% lidocaine. Small incision was made. Using ultrasound guidance, 17 gauge coaxial needle was directed into the right hepatic lesion. Total of 3 core biopsies were performed with an 18 gauge core device. Three adequate specimens obtained and placed in formalin. Gel-Foam slurry was injected as the 17 gauge needle was removed. Bandage placed over the puncture site. FINDINGS: Heterogeneous hepatic lesions. A large lesion in the right hepatic lobe with central necrosis was biopsied. 3 adequate specimens were obtained. No immediate bleeding or hematoma formation. IMPRESSION: Ultrasound-guided core biopsy of a right hepatic lesion. Electronically Signed   By: Juliene Balder M.D.   On: 08/04/2024 16:07   DG  Abd 1 View Result Date: 08/04/2024 CLINICAL DATA:  Constipation. EXAM: ABDOMEN - 1 VIEW COMPARISON:  CT 08/01/2024 FINDINGS: No bowel dilatation or evidence of obstruction. Small to moderate volume of formed stool throughout the colon, diminished stool burden from prior CT. Calcifications in the right pelvis correspond to retroperitoneal phleboliths. Overlying artifacts project over the pelvis. IMPRESSION: Small to moderate volume of formed stool throughout the colon, diminished stool burden from prior CT. Electronically Signed   By: Andrea Gasman M.D.   On: 08/04/2024 13:54     .Critical Care  Performed by: Lorette Mayo, MD Authorized by: Lorette Mayo, MD   Critical care provider statement:    Critical care time (minutes):  30   Critical care was necessary to treat or prevent imminent or life-threatening deterioration of the following conditions:  CNS failure or compromise   Critical care was time spent personally by me on the following activities:  Development of treatment plan with patient or surrogate, discussions with consultants, evaluation of patient's response to treatment, examination of patient, ordering and review of laboratory studies, ordering and review of radiographic studies, ordering and performing treatments and interventions, pulse oximetry, re-evaluation of patient's condition and review of old charts    Medications Ordered in the ED  0.9 %  sodium chloride  infusion (Manually program via Guardrails IV Fluids) (0 mLs Intravenous Stopped 07/23/24 1008)   stroke: early stages of recovery book ( Does not apply Given 07/24/24 1020)  iohexol (OMNIPAQUE) 350 MG/ML injection 150 mL (150 mLs Intravenous Contrast Given 07/23/24 0910)  sodium chloride  0.9 % bolus 250 mL (0 mLs Intravenous Stopped 07/30/24 0745)  sodium chloride  0.9 % bolus 500 mL (0 mLs Intravenous Stopped 07/30/24 0745)  bisacodyl (DULCOLAX) suppository 10 mg (10 mg Rectal Given 07/30/24 0919)                                     Medical Decision Making Amount and/or Complexity of Data Reviewed Labs: ordered.  Risk Prescription drug management. Decision regarding hospitalization.   Ultimately patient found to have a ischemic stroke along with profound symptomatic anemia.  Discussed with pharmacy, neurology and hospitalist.  Ultimately I thought it was best to just hold her Eliquis , transfuse her some blood and workup for stroke.  I did not want to give her Plavix or aspirin with the anemia of unclear etiology.  She was outside the window for TNK and also probably would have been a candidate secondary to the new anemia.  Neurology to see.  Hospitalist to admit.     Final diagnoses:  Anemia, unspecified type  Cerebrovascular accident (CVA), unspecified mechanism [facility])    ED Discharge Orders          Ordered    atorvastatin (LIPITOR) 20 MG tablet  Daily        07/31/24 1207               Tamorah Hada, Selinda, MD 08/05/24 0410

## 2024-08-05 NOTE — Progress Notes (Signed)
 Upon assessment MEWS yellow [address] NP contacted updated regarding findings Tylenol  administer per recommendation and VS per protocol to follow. Will continue to monitor and intervene as clinically indicated.

## 2024-08-05 NOTE — Progress Notes (Signed)
 Occupational Therapy Note  Patient Details  Name: Tiffany [name] MRN: [id number] Date of Birth: September 18, 1941  Today's Date: 08/05/2024 OT Missed Time: 75 Minutes Missed Time Reason: Patient fatigue  Pt greeted asleep in supine, pt declined session d/t fatigue. Nursing reports fever last night and not sleeping much.    Ronal Gift [facility] 08/05/2024, 8:56 AM

## 2024-08-05 NOTE — Plan of Care (Signed)
  Problem: Consults Goal: RH STROKE PATIENT EDUCATION Description: See Patient Education module for education specifics  Outcome: Progressing   Problem: RH SAFETY Goal: RH STG ADHERE TO SAFETY PRECAUTIONS W/ASSISTANCE/DEVICE Description: STG Adhere to Safety Precautions With cues Assistance/Device. Outcome: Progressing   Problem: RH KNOWLEDGE DEFICIT Goal: RH STG INCREASE KNOWLEDGE OF STROKE PROPHYLAXIS Description: Patient and family will be able to manage care/secondary risks at discharge using educational resources for medications and dietary modification independently Outcome: Progressing   Problem: RH KNOWLEDGE DEFICIT Goal: RH STG INCREASE KNOWLEDGE OF HYPERTENSION Description: Patient and family will be able to manage HTN at discharge using educational resources for medications and dietary modification independently Outcome: Progressing Goal: RH STG INCREASE KNOWLEGDE OF HYPERLIPIDEMIA Description: Patient and family will be able to manage HLD at discharge using educational resources for medications and dietary modification independently Outcome: Progressing

## 2024-08-06 ENCOUNTER — Inpatient Hospital Stay (HOSPITAL_COMMUNITY)

## 2024-08-06 DIAGNOSIS — I6782 Cerebral ischemia: Secondary | ICD-10-CM | POA: Diagnosis not present

## 2024-08-06 DIAGNOSIS — I63512 Cerebral infarction due to unspecified occlusion or stenosis of left middle cerebral artery: Secondary | ICD-10-CM | POA: Diagnosis not present

## 2024-08-06 DIAGNOSIS — I700 Atherosclerosis of aorta: Secondary | ICD-10-CM | POA: Diagnosis not present

## 2024-08-06 DIAGNOSIS — R0989 Other specified symptoms and signs involving the circulatory and respiratory systems: Secondary | ICD-10-CM | POA: Diagnosis not present

## 2024-08-06 LAB — CBC
HCT: 27.1 % — ABNORMAL LOW (ref 36.0–46.0)
Hemoglobin: 8.4 g/dL — ABNORMAL LOW (ref 12.0–15.0)
MCH: 25.5 pg — ABNORMAL LOW (ref 26.0–34.0)
MCHC: 31 g/dL (ref 30.0–36.0)
MCV: 82.1 fL (ref 80.0–100.0)
Platelets: 431 K/uL — ABNORMAL HIGH (ref 150–400)
RBC: 3.3 MIL/uL — ABNORMAL LOW (ref 3.87–5.11)
RDW: 19.7 % — ABNORMAL HIGH (ref 11.5–15.5)
WBC: 18.4 K/uL — ABNORMAL HIGH (ref 4.0–10.5)
nRBC: 0 % (ref 0.0–0.2)

## 2024-08-06 LAB — URINE CULTURE

## 2024-08-06 NOTE — Progress Notes (Signed)
 PROGRESS NOTE   Subjective/Complaints:  Pt reports good this morning-  Slept SO-so- talking and d/w me her concerns- which was not occurring yesterday Denies pain. Doesn't remember LBM but denies constipation.  LBM seen documented is 9/28  Objective:   DG CHEST PORT 1 VIEW Result Date: 08/06/2024 EXAM: 1 VIEW(S) XRAY OF THE CHEST 08/06/2024 08:47:00 AM COMPARISON: 10/17/2023 CLINICAL HISTORY: Acute ischemic left middle cerebral artery (MCA) stroke (HCC). FINDINGS: LUNGS AND PLEURA: Low lung volumes. No focal pulmonary opacity. No pulmonary edema. No pleural effusion. No pneumothorax. HEART AND MEDIASTINUM: Aortic atherosclerosis. No acute abnormality of the cardiac and mediastinal silhouettes. BONES AND SOFT TISSUES: No acute osseous abnormality. IMPRESSION: 1. No acute cardiopulmonary abnormality 2. Aortic atherosclerosis 3. Low lung volumes Electronically signed by: Katheleen Faes MD 08/06/2024 09:37 AM EDT RP Workstation: [id number]    Recent Labs    08/05/24 0754 08/06/24 0505  WBC 18.9* 18.4*  HGB 8.3* 8.4*  HCT 26.9* 27.1*  PLT 442* 431*   No results for input(s): NA, K, CL, CO2, GLUCOSE, BUN, CREATININE, CALCIUM in the last 72 hours.   Intake/Output Summary (Last 24 hours) at 08/06/2024 1711 Last data filed at 08/06/2024 0700 Gross per 24 hour  Intake 218 ml  Output 200 ml  Net 18 ml         Physical Exam: Vital Signs Blood pressure 104/63, pulse 91, temperature 98.1 F (36.7 C), resp. rate (!) 25, height 5' 2 (1.575 m), weight 55.4 kg, SpO2 99%.      General: awake, alert, appropriate,sitting up in bed; ate some breakfast per NT; don't have amount;  NAD HENT: conjugate gaze; oropharynx  a little dry CV: regular rate and rhythm; no JVD Pulmonary: CTA B/L; no W/R/R- good air movement- sounds clear GI: soft, NT, ND, (+)BS- hypoactive Psychiatric: appropriate- much more interactive than  yesterday Neurological: very alert- talking with me- slightly confused but much better Skin: C/D/I. No apparent lesions. MSK:      No apparent deformity.    Neurologic exam: Nods head yes/no appropriately on exam.  Moving all 4 extremities in bed.  Resting status post procedure.  Prior exams exams: Cognition: Assessment limited by both expressive and receptive aphasia; a little more alert today, speaking in intermittent sentences, 40% intelligible. Insight: Poor insight into current condition.  Mood: Subdued, flat Sensation: Equal and intact in BL UE and Les.  Reflexes:  Negative Hoffman's and babinski signs bilaterally.  CN: Mild R facial droop Coordination: No apparent tremors. No ataxia   Spasticity: MAS 0 in all extremities.  Strength: Moving all 4 extremities antigravity against resistance        Assessment/Plan: 1. Functional deficits which require 3+ hours per day of interdisciplinary therapy in a comprehensive inpatient rehab setting. Physiatrist is providing close team supervision and 24 hour management of active medical problems listed below. Physiatrist and rehab team continue to assess barriers to discharge/monitor patient progress toward functional and medical goals  Care Tool:  Bathing    Body parts bathed by patient: Right arm, Left arm, Chest, Abdomen, Front perineal area, Buttocks, Right upper leg, Left upper leg, Right lower leg, Left lower leg, Face  Bathing assist Assist Level: Contact Guard/Touching assist     Upper Body Dressing/Undressing Upper body dressing   What is the patient wearing?: Pull over shirt    Upper body assist Assist Level: Set up assist    Lower Body Dressing/Undressing Lower body dressing      What is the patient wearing?: Underwear/pull up, Pants     Lower body assist Assist for lower body dressing: Contact Guard/Touching assist     Toileting Toileting    Toileting assist Assist for toileting: Contact  Guard/Touching assist     Transfers Chair/bed transfer  Transfers assist     Chair/bed transfer assist level: Minimal Assistance - Patient > 75%     Locomotion Ambulation   Ambulation assist      Assist level: Minimal Assistance - Patient > 75% Assistive device: No Device Max distance: 180'   Walk 10 feet activity   Assist     Assist level: Minimal Assistance - Patient > 75% Assistive device: No Device   Walk 50 feet activity   Assist    Assist level: Minimal Assistance - Patient > 75% Assistive device: No Device    Walk 150 feet activity   Assist    Assist level: Minimal Assistance - Patient > 75% Assistive device: No Device    Walk 10 feet on uneven surface  activity   Assist     Assist level: Minimal Assistance - Patient > 75% Assistive device: Other (comment) (No device, but holding to RHR)   Wheelchair     Assist Is the patient using a wheelchair?: Yes Type of Wheelchair: Manual    Wheelchair assist level: Dependent - Patient 0%      Wheelchair 50 feet with 2 turns activity    Assist        Assist Level: Dependent - Patient 0%   Wheelchair 150 feet activity     Assist      Assist Level: Dependent - Patient 0%   Blood pressure 104/63, pulse 91, temperature 98.1 F (36.7 C), resp. rate (!) 25, height 5' 2 (1.575 m), weight 55.4 kg, SpO2 99%.  Medical Problem List and Plan: 1. 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 Onc, GI consulted. Called patient's daughter and left VM to discuss; did discuss workup with patient this AM as well. Close monitorring for neurologic changes given heparin ggt  and risk for hemorrhagic conversion; planning to place IVC filter and DC AC   2.  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   - 9-30: Given recent embolic stroke, continue Eliquis  for now.  May need to reduce dose to 2.5 mg if hemoglobin continues to downtrend.  Will get CTA to evaluate for PE  given ongoing tachycardia, leukocytosis, and low-grade fevers with generalized chest/abdominal pain and history of DVT 9-9 and 9-23--would need this evaluated prior to adjustments of anticoagulation  - 10-1: CTA overnight with PE right upper and lower lobe, started on heparin ggt without bolus overnight - planning for IVC placement with heparin hold 4 hours prior- -10-2: IR may do IVC filter placement today or tomorrow; pharmacy informed, will resume heparin drip if procedure not planned by 5 PM - 10-3:  OR for IVC placement today.  Discussed with oncology Dr. Onesimo and patient's daughter, still some role for prophylactic dose Lovenox  for clot stabilization and prevention of extension of PE/DVTs.  Will place for tomorrow a.m., monitor CBC daily, DC Lovenox  if repeated transfusions required.  3. Pain Management: tylenol  prn.   - Would not schedule Tylenol  due to potential to mask fevers.   - 10/1: Tx as above; can now schedule tylenol  1000 mg TID --improved  10/5- No pain 4. Mood/Behavior/Sleep: LCSW to follow for evaluation and support.             --melatonin prn insomnia.              -antipsychotic agents: N\/A   - 9-30: Withdrawn, poor attention.  Start fluoxetine 20 mg daily for motor recovery and behavior.  Start sleep log.   - sleep  log appropriate  5. Neuropsych/cognition: This patient is not capable of making decisions on her own behalf.   6. Skin/Wound Care: Routine pressure relief measures.  7. Fluids/Electrolytes/Nutrition: Monitor I/O. Check CMET in am.    -9-30: A.m. labs stable  - 10-2: Per daughter, eating meals brought in from home, monitor weights weekly  8.   Acute on chronic anemia: Monitor for signs of bleeding. H/H stable 6.1-->8.7.  FOBT -9-23. --add iron supplement as Iron level <10.  -  9-30: Hemoglobin downtrending, 7.3.  No obvious source of bleeding.  Added FOBT today.  Repeat H&H in AM; may need to transfuse if any lower.  Continue Eliquis  at current dose but if signs of bleeding may need to discuss adjusting to 2.5 mg daily. 10-1: Hemoglobin 7.1, on heparin drip. Monitor daily. Transfusion 1 U PRBC ordered; repeat h/h this evening. Holley Schmitz discussed with patient's daughter over the phone.  10/2: Hgb responses 8.0; monitor - ?transition to oral ASA +/- IVC filter to assist in reducing bleed, will d/w GI and Onc today 10-3: Hemoglobin stable 8.4; was off of heparin drip most of yesterday.  After IVC placement today + ppx lovenox  as above. Monitor daily. NO ASA.  10/4- Hb stable at 8.3 10/5- Hb stable at 8.4 9.  Resting tachycardia: HR ranging in 110-120 at rest. CTA to rule out PE?             -pt is not in distress, breathing comfortably, no chest pain, on eliquis              --Afebrile X 24 hours.   --Monitor for symptoms with increase in activity.  9-30: Remained in the 90s overnight and throughout therapies this a.m.; coming up in the 110s this afternoon--stable 10/1-3  10. Leukocytosis: WBC on upward trend--16.0-->14.5-->16.3              -however, pt is afebrile, urine/chest clear, on full a/c --Sepsis work up prn temp elevation 9-30: WBCs downtrending 14.6--have been stable at approximately this level since 9-24.  No fevers, chills, obvious lines of infection.  Is complaining of some chest  pain and temperature  100 this afternoon; CTA ordered as above -10/1: Right upper lower lobe PE, along with multiple intra-abdominal masses concerning for metastatic cancer; most likely source. Tx PE as above and monitor.  - 10/2 stable 14-16 10-3: Increased to 18.  Patient with expressive aphasia, difficulty performing review of systems, complicated by cancer, multiple blood clots, and steroids.  Will get urinalysis. 10/4- Per #13- due to new fever and WBC is 18.9 today- up slightly from 18.3 10/5- WBC down slightly to 18/4- on Rocephin- no more fevers- much more interactive- feels even though WBC is stable overall, she's much less confused-  11.  Elevated ALP, other LFTs are normal.  Trend.  12.  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  CT A/p 9/30 with: IMPRESSION: 1. Multiple hepatic masses consistent with metastases. The most likely primary is gastric given the circumferentially thickened mid to distal stomach, and hypogastric and gastrohepatic ligament adenopathy. 2. 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 3. Small amount of pelvic ascites.  - Called patient's daughter as above, left voicemail to review results. - Oncology consult placed for today; GI consult also placed for biopsy.   - 10/2: CEA, CA-19 pending. Daughter declined EGD for biopsy this AM. Per Oncology, may be able to perform liver biopsy for pathology; will follow up with Dr Lanny.   - 10/3: Liver biopsy performed today; pathology pending.  Appreciate Dr. 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  Plan to continue rehab and reassess functional gains closer to discharge.  10/4- Biopsy results pending  10/5- still pending 12.  Constipation.  Last bowel movement recorded 9-12.  Has been eating very little.  Will get KUB today.  10/3- Per pt's daughter, LBM was Wed/Th- and was soft- doesn't want me to give additional bowel  meds  10/5- Still no BM- ate a t little more today- daughter doesn't want her ot get more bowel meds 13. Fever/Leukocytosis  10/4- Ordered another U/A and Cx and Blood Cx's after d/w ID- T was 100.9- so started Rocephin 2 grams q 24 hours per ID request and will follw Cx's and clinically. Also gave tylenol  and on yellow MEWS.  10/5- MEWS back to green- no fever- blood Cx's no growth so far- CXR (-) and U/A (-)-  if Cx's are negative, can stop.  14. Tachycardia  10/4- see #13.   10/5- much better   I spent a total of 43   minutes on total care today- >50% coordination of care- due to  D/w nursing;  pt and review of labs, vitals, orders and Cx's/biopsy pending  LOS: 6 days A FACE TO FACE EVALUATION WAS PERFORMED  Tiffany [name] 08/06/2024, 5:11 PM

## 2024-08-06 NOTE — Progress Notes (Signed)
 Occupational Therapy Session Note  Patient Details  Name: Tiffany [name] MRN: [id number] Date of Birth: 03-Jan-1941  Today's Date: 08/06/2024 OT Individual Time: 0915-1000 OT Individual Time Calculation (min): 45 min    Short Term Goals: Week 1:  OT Short Term Goal 1 (Week 1): STGs=LTGs due to patient's estimated length of stay.  Skilled Therapeutic Interventions/Progress Updates:    (1st) Occupational Therapy Session:  Patient resting in bed at the time of arrival watching morning mass. The pt wasn't clear regarding her schedule for today, once I explained she was willing to participate.  The pt presents with the following vitals, a pulse of 110, BP of 111/63,  HR of 77, and 02 saturation of 99% on room air. The pt was in agreement with completing BADL related task at sink LOF.  The pt was able to come from supine to EOB with Mod to MinA, she was able to transfer from EOB to w/c LOF with MinA using the RW for additional balance. The pt was transported to the sink and was able to doff her hospital gown with MinA for fasteners. The pt was able to wash her face and UB with s/uA, she was s/uA for wash the upper region of BLE. The pt was Dep with BLE for the lower region, inclusive of her feet. The pt was able to apply deo with s/uA, she was MinA for donning her hospital gown in relation to fastners. The pt was Dep for her non-skid socks.  The pt was able to brush her teeth with s/uA,.  At the end of the session, the pt was released to nursing care for transferring back to bed LOF.    (2nd Occupational Therapy Session: The pt was resting in bed at the time of arrival. The pt indicated that she wasn't feel well, but she would do what she could do . The pt was provided a 1lb dumb bell to complete bicep curls, horizontal abduction, and shld flexion 1 set of 10 with rest breaks as needed.  The pt required 1 rest break with each exercise. The pt went on to retrieve cones from one hand and placing them in the  opposite hand while demonstrating shld flexion for retrieval with effective carryover following vc's.  The pt went on to name the colors of the cone with 75% accuracy. At the end of the session, the pt remained at bed LOF with the call light and bedside table within reach and all additional needs addressed prior to me exiting the room.   Therapy Documentation Precautions:  Precautions Precautions: Fall Recall of Precautions/Restrictions: Impaired Precaution/Restrictions Comments: Aphasia Restrictions Weight Bearing Restrictions Per Provider Order: No  Therapy/Group: Individual Therapy  Elvera JONETTA Mace 08/06/2024, 3:58 PM

## 2024-08-06 NOTE — Progress Notes (Signed)
 Physical Therapy Session Note  Patient Details  Name: Tiffany [name] MRN: [id number] Date of Birth: 1941/08/08  Today's Date: 08/06/2024 PT Missed Time: 45 Minutes Missed Time Reason: Patient unwilling to participate;Patient fatigue  Short Term Goals: Week 1:  PT Short Term Goal 1 (Week 1): STG = LTG due to ELOS  Skilled Therapeutic Interventions/Progress Updates:     Pt received supine in bed and politely refuses therapy at this time due to fatigue and feeling sick. PT gently encourages participation but pt continues to decline. PT will follow up as able.   Therapy Documentation Precautions:  Precautions Precautions: Fall Recall of Precautions/Restrictions: Impaired Precaution/Restrictions Comments: Aphasia Restrictions Weight Bearing Restrictions Per Provider Order: No General: PT Amount of Missed Time (min): 45 Minutes PT Missed Treatment Reason: Patient unwilling to participate;Patient fatigue   Therapy/Group: Individual Therapy  Elsie JAYSON Dawn, PT, DPT 08/06/2024, 11:04 AM

## 2024-08-07 ENCOUNTER — Other Ambulatory Visit (HOSPITAL_COMMUNITY): Payer: Self-pay

## 2024-08-07 DIAGNOSIS — I63512 Cerebral infarction due to unspecified occlusion or stenosis of left middle cerebral artery: Secondary | ICD-10-CM | POA: Diagnosis not present

## 2024-08-07 DIAGNOSIS — K3189 Other diseases of stomach and duodenum: Secondary | ICD-10-CM | POA: Diagnosis not present

## 2024-08-07 DIAGNOSIS — R933 Abnormal findings on diagnostic imaging of other parts of digestive tract: Secondary | ICD-10-CM | POA: Diagnosis not present

## 2024-08-07 LAB — CBC
HCT: 25 % — ABNORMAL LOW (ref 36.0–46.0)
Hemoglobin: 7.7 g/dL — ABNORMAL LOW (ref 12.0–15.0)
MCH: 25.2 pg — ABNORMAL LOW (ref 26.0–34.0)
MCHC: 30.8 g/dL (ref 30.0–36.0)
MCV: 82 fL (ref 80.0–100.0)
Platelets: 433 K/uL — ABNORMAL HIGH (ref 150–400)
RBC: 3.05 MIL/uL — ABNORMAL LOW (ref 3.87–5.11)
RDW: 19.9 % — ABNORMAL HIGH (ref 11.5–15.5)
WBC: 17.3 K/uL — ABNORMAL HIGH (ref 4.0–10.5)
nRBC: 0 % (ref 0.0–0.2)

## 2024-08-07 LAB — BASIC METABOLIC PANEL WITH GFR
Anion gap: 10 (ref 5–15)
BUN: 22 mg/dL (ref 8–23)
CO2: 21 mmol/L — ABNORMAL LOW (ref 22–32)
Calcium: 8.4 mg/dL — ABNORMAL LOW (ref 8.9–10.3)
Chloride: 110 mmol/L (ref 98–111)
Creatinine, Ser: 0.85 mg/dL (ref 0.44–1.00)
GFR, Estimated: >60 mL/min (ref >60)
Glucose, Bld: 95 mg/dL (ref 70–99)
Potassium: 3.5 mmol/L (ref 3.5–5.1)
Sodium: 141 mmol/L (ref 135–145)

## 2024-08-07 MED ORDER — FERROUS SULFATE 325 (65 FE) MG PO TABS
325.0000 mg | ORAL_TABLET | Freq: Every day | ORAL | Status: DC
  Administered 2024-08-08 – 2024-08-11 (×4): 325 mg via ORAL
  Filled 2024-08-07 (×4): qty 1

## 2024-08-07 NOTE — Progress Notes (Signed)
 Occupational Therapy Session Note  Patient Details  Name: Tiffany [name] MRN: [id number] Date of Birth: Oct 18, 1941  Today's Date: 08/07/2024 OT Individual Time: 1003-1051 OT Individual Time Calculation (min): 48 min    Short Term Goals: Week 1:  OT Short Term Goal 1 (Week 1): STGs=LTGs due to patient's estimated length of stay.  Skilled Therapeutic Interventions/Progress Updates:   Pt greeted supine in bed, pt agreeable to OT intervention.    Pts daughter was present during session which highly motivated her.   Transfers/bed mobility/functional mobility:  Pt completed supine>sit with CGA. Stand pivot to w/c with no AD and CGA. Pt able to complete functional ambulation from gym>room with no AD and CGA. ( ~80 ft) Therapeutic activity:  Pt completed functional ambulation task to facilitate improved functional endurance with pt instrucetd to ambulate ~ 7 ft to match cards >board. Pt completed 4 trials total  but needed a seated rest break after each lap.   ADLs:  Grooming: pt completed seated oral care with set- up assist.   Transfers: ambulatory toilet transfer with no AD with CGA.   Ended session with pt seated in recliner with all needs within reach and daughter present.  Therapy Documentation Precautions:  Precautions Precautions: Fall Recall of Precautions/Restrictions: Impaired Precaution/Restrictions Comments: Aphasia Restrictions Weight Bearing Restrictions Per Provider Order: No  Pain: Unrated pain reported in abdomen, rest breaks provided as needed.    Therapy/Group: Individual Therapy  Tiffany [name] 08/07/2024, 12:18 PM

## 2024-08-07 NOTE — Progress Notes (Signed)
 Physical Therapy Session Note  Patient Details  Name: Tiffany [name] MRN: [id number] Date of Birth: July 08, 1941  Today's Date: 08/07/2024 PT Individual Time: 0805-0820 PT Individual Time Calculation (min): 15 min   Short Term Goals: Week 1:  PT Short Term Goal 1 (Week 1): STG = LTG due to ELOS  Skilled Therapeutic Interventions/Progress Updates:    Pt presents in room in bed, pt not agreeable to PT due to reports of feeling sick. Upon further questioning pt reports that it is her stomach that is bothering her, RN notified. Pt provided with extensive education on participating with therapies to decrease pain and lethargy with pt continuing to refuse. Therapist offers to assist pt to bathroom to attempt BM to decrease stomach pain with pt continuing refusal. Pt missing 30 min of 45 min session. Therapist will reattempt as schedule allows.  Therapy Documentation Precautions:  Precautions Precautions: Fall Recall of Precautions/Restrictions: Impaired Precaution/Restrictions Comments: Aphasia Restrictions Weight Bearing Restrictions Per Provider Order: No General: PT Amount of Missed Time (min): 30 Minutes PT Missed Treatment Reason: Patient unwilling to participate    Therapy/Group: Individual Therapy  Reche Ohara PT, DPT 08/07/2024, 8:34 AM

## 2024-08-07 NOTE — Progress Notes (Signed)
 Speech Language Pathology Daily Session Note  Patient Details  Name: Tiffany [name] MRN: [id number] Date of Birth: 01/19/1941  Today's Date: 08/07/2024 SLP Individual Time: 1447-1515 SLP Individual Time Calculation (min): 28 min  Short Term Goals: Week 1: SLP Short Term Goal 1 (Week 1): STGs = LTGs d/t ELOS  Skilled Therapeutic Interventions:  Patient was seen in PM to address language. Pt easily alerted upon SLP arrival though quickly returned her head to lower position after SLP entrance. She denied pain when asked. SLP attempting to engage pt in structured task focusing on success with yes/ no questions. Given a visual prompt she identified biographical information with 50% acc given mod A. In other minutes of session, SLP initiated a functional naming task with pt responding adversely and repeating, ask her as she pointed toward the door. SLP attempting to shift focus of session through challenging pt to identify her information in a FO2 presented visually. Pt warranting mod A in 3 trials. Pt noted to lay her head down and not respond in remainder of attempts. Therefore, pt left in bed with call button within reach and bed alarm active. SLP to continue POC.   Pain Pain Assessment Pain Scale: 0-10 Pain Score: 0-No pain Pain Location: Neck Pain Intervention(s): Pain med given for lower pain score than stated, per patient request  Therapy/Group: Individual Therapy  Joane GORMAN Fuss 08/07/2024, 3:53 PM

## 2024-08-07 NOTE — Progress Notes (Signed)
 PROGRESS NOTE   Subjective/Complaints:  No events overnight.  Patient asking when she can go home.  Discussed discharge date is pending team meetings tomorrow.  She had no other questions, concerns.  Is endorsing generalized pain. Vitals stable     08/07/2024    3:58 AM 08/07/2024   12:06 AM 08/06/2024    8:59 PM  Vitals with BMI  Systolic 121 120 874  Diastolic 63 54 73  Pulse 94 96 99    Labs with downtrending WBC, HgB 7.7, PLT stable; BMP stable  P.o. intakes variable, 10-100% Continent of bladder  Last BM 9/28  Unable to obtain ROS due to patient cognitive status.   + Diffuse pain  Objective:   DG CHEST PORT 1 VIEW Result Date: 08/06/2024 EXAM: 1 VIEW(S) XRAY OF THE CHEST 08/06/2024 08:47:00 AM COMPARISON: 10/17/2023 CLINICAL HISTORY: Acute ischemic left middle cerebral artery (MCA) stroke (HCC). FINDINGS: LUNGS AND PLEURA: Low lung volumes. No focal pulmonary opacity. No pulmonary edema. No pleural effusion. No pneumothorax. HEART AND MEDIASTINUM: Aortic atherosclerosis. No acute abnormality of the cardiac and mediastinal silhouettes. BONES AND SOFT TISSUES: No acute osseous abnormality. IMPRESSION: 1. No acute cardiopulmonary abnormality 2. Aortic atherosclerosis 3. Low lung volumes Electronically signed by: Katheleen Faes MD 08/06/2024 09:37 AM EDT RP Workstation: [id number]    Recent Labs    08/06/24 0505 08/07/24 0615  WBC 18.4* 17.3*  HGB 8.4* 7.7*  HCT 27.1* 25.0*  PLT 431* 433*   Recent Labs    08/07/24 0615  NA 141  K 3.5  CL 110  CO2 21*  GLUCOSE 95  BUN 22  CREATININE 0.85  CALCIUM 8.4*    No intake or output data in the 24 hours ending 08/07/24 0821        Physical Exam: Vital Signs Blood pressure 121/63, pulse 94, temperature 98.7 F (37.1 C), temperature source Oral, resp. rate 16, height 5' 2 (1.575 m), weight 55.4 kg, SpO2 100%.  General: awake, alert, appropriate, laying in  bed. HENT: conjugate gaze; moist oropharynx. CV: regular rate and rhythm; no JVD Pulmonary: CTA B/L; no W/R/R- good air movement  GI: soft, NT, ND, (+)BS- hypoactive.  Generalized, diffuse mild tenderness. Psychiatric: Dysphoric, but more interactive than prior.  Follows commands appropriately.  Skin: C/D/I. No apparent lesions. MSK:      No apparent deformity.  Cognition: Awake, alert, oriented to self and hospital.  Year with cueing. + Expressive aphasia significantly improved, still some difficulty word finding Insight: Poor insight into current condition.  Sensation: Equal and intact in BL UE and Les.  Reflexes:  Negative Hoffman's and babinski signs bilaterally.  CN: Mild R facial droop Coordination: No apparent tremors. No ataxia   Spasticity: MAS 0 in all extremities.  Strength: Moving all 4 extremities antigravity against resistance        Assessment/Plan: 1. Functional deficits which require 3+ hours per day of interdisciplinary therapy in a comprehensive inpatient rehab setting. Physiatrist is providing close team supervision and 24 hour management of active medical problems listed below. Physiatrist and rehab team continue to assess barriers to discharge/monitor patient progress toward functional and medical goals  Care Tool:  Bathing  Body parts bathed by patient: Right arm, Left arm, Chest, Abdomen, Front perineal area, Buttocks, Right upper leg, Left upper leg, Right lower leg, Left lower leg, Face         Bathing assist Assist Level: Contact Guard/Touching assist     Upper Body Dressing/Undressing Upper body dressing   What is the patient wearing?: Pull over shirt    Upper body assist Assist Level: Set up assist    Lower Body Dressing/Undressing Lower body dressing      What is the patient wearing?: Underwear/pull up, Pants     Lower body assist Assist for lower body dressing: Contact Guard/Touching assist     Toileting Toileting     Toileting assist Assist for toileting: Contact Guard/Touching assist     Transfers Chair/bed transfer  Transfers assist     Chair/bed transfer assist level: Minimal Assistance - Patient > 75%     Locomotion Ambulation   Ambulation assist      Assist level: Minimal Assistance - Patient > 75% Assistive device: No Device Max distance: 180'   Walk 10 feet activity   Assist     Assist level: Minimal Assistance - Patient > 75% Assistive device: No Device   Walk 50 feet activity   Assist    Assist level: Minimal Assistance - Patient > 75% Assistive device: No Device    Walk 150 feet activity   Assist    Assist level: Minimal Assistance - Patient > 75% Assistive device: No Device    Walk 10 feet on uneven surface  activity   Assist     Assist level: Minimal Assistance - Patient > 75% Assistive device: Other (comment) (No device, but holding to RHR)   Wheelchair     Assist Is the patient using a wheelchair?: Yes Type of Wheelchair: Manual    Wheelchair assist level: Dependent - Patient 0%      Wheelchair 50 feet with 2 turns activity    Assist        Assist Level: Dependent - Patient 0%   Wheelchair 150 feet activity     Assist      Assist Level: Dependent - Patient 0%   Blood pressure 121/63, pulse 94, temperature 98.7 F (37.1 C), temperature source Oral, resp. rate 16, height 5' 2 (1.575 m), weight 55.4 kg, SpO2 100%.  Medical Problem List and Plan: 1. 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 Onc, GI consulted. Called patient's daughter and left VM to discuss; did discuss workup with patient this  AM as well. Close monitorring for neurologic changes given heparin ggt and risk for hemorrhagic conversion; planning to place IVC filter and DC [facility]   - Teams tomorrow a.m.  2.  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   - 9-30: Given recent embolic stroke, continue Eliquis  for now.  May need to reduce dose to 2.5 mg if hemoglobin continues to downtrend.  Will get CTA to evaluate for PE given ongoing tachycardia, leukocytosis, and low-grade fevers with generalized chest/abdominal pain and history of DVT 9-9 and 9-23--would need this evaluated prior to adjustments of anticoagulation  - 10-1: CTA overnight with PE right upper and lower lobe, started on heparin ggt without bolus overnight - planning for IVC placement with heparin hold 4 hours prior- -10-2: IR may do IVC filter placement today or tomorrow; pharmacy informed, will resume heparin drip if procedure not planned by 5 PM - 10-3:  OR for IVC placement today.  Discussed with oncology Dr. Onesimo and patient's daughter, still some role for prophylactic dose Lovenox  for clot stabilization and prevention of extension of PE/DVTs.  Will place for tomorrow a.m., monitor CBC daily, DC Lovenox  if repeated transfusions required. -- Tolerating current regimen well  3. Pain Management: tylenol  prn.    - 10/1: Tx as above; can now schedule tylenol  1000 mg TID --improved  10/6: Continues to complain of generalized, diffuse pain.  No complaints of pain over the weekend.  Continue current regimen.  4. Mood/Behavior/Sleep: LCSW to follow for evaluation and support.             --melatonin prn  insomnia.              -antipsychotic agents: N\/A   - 9-30: Withdrawn, poor attention.  Start fluoxetine 20 mg daily for motor recovery and behavior.  Start sleep log.   - sleep log appropriate  10-6: Starting to see some improving interaction and improving aphasia.  Continue current regimen.  5. Neuropsych/cognition: This patient is not capable of making decisions on her own behalf.   6. Skin/Wound Care: Routine pressure relief measures.  7. Fluids/Electrolytes/Nutrition: Monitor I/O. Check CMET in am.    -9-30: A.m. labs stable  - 10-2: Per daughter, eating meals brought in from home, monitor weights weekly  10-6: P.o. intake sporadic; ate 100% of breakfast, 0% lunch and dinner.  Will plan to bring up appetite stimulant again with family  Filed Weights   07/31/24 1530  Weight: 55.4 kg     8.   Acute on chronic anemia: Monitor for signs of bleeding. H/H stable 6.1-->8.7.  FOBT -9-23. --add iron supplement as Iron level <10.  -  9-30: Hemoglobin downtrending, 7.3.  No obvious source of bleeding.  Added FOBT today.  Repeat H&H in AM; may need to transfuse if any lower.  Continue Eliquis  at current dose but if signs of bleeding may need to discuss adjusting to 2.5 mg daily. 10-1: Hemoglobin 7.1, on heparin drip. Monitor daily. Transfusion 1 U PRBC ordered; repeat h/h this evening. Holley Schmitz discussed with patient's daughter over the phone.  10/2: Hgb responses 8.0; monitor - ?transition to oral ASA +/- IVC filter to assist in reducing bleed, will d/w GI and Onc today 10-3: Hemoglobin stable 8.4; was off of heparin drip most of yesterday.  After IVC placement today + ppx lovenox  as above. Monitor daily. NO ASA.  10/4- Hb stable at 8.3 10/5- Hb stable at 8.4 10-6: Hemoglobin 7.7.  Continue to trend  9.  Resting tachycardia: HR ranging in 110-120 at rest. CTA to rule out PE?             -pt is not in distress, breathing comfortably,  no chest pain, on eliquis              --Afebrile X 24  hours.   --Monitor for symptoms with increase in activity.  9-30: Remained in the 90s overnight and throughout therapies this a.m.; coming up in the 110s this afternoon--stable 10/1-3  10. Leukocytosis: WBC on upward trend--16.0-->14.5-->16.3              -however, pt is afebrile, urine/chest clear, on full a/c --Sepsis work up prn temp elevation 9-30: WBCs downtrending 14.6--have been stable at approximately this level since 9-24.  No fevers, chills, obvious lines of infection.  Is complaining of some chest pain and temperature 100 this afternoon; CTA ordered as above -10/1: Right upper lower lobe PE, along with multiple intra-abdominal masses concerning for metastatic cancer; most likely source. Tx PE as above and monitor.  - 10/2 stable 14-16 10-3: Increased to 18.  Patient with expressive aphasia, difficulty performing review of systems, complicated by cancer, multiple blood clots, and steroids.  Will get urinalysis. 10/4- Ordered another U/A and Cx and Blood Cx's after d/w ID- T was 100.9- so started Rocephin 2 grams q 24 hours per ID request and will follw Cx's and clinically. Also gave tylenol  and on yellow MEWS.  10/5- MEWS back to green- no fever- blood Cx's no growth so far- CXR (-) and U/A (-)-  if Cx's are negative, can stop.   10/6 Cx read pending; CXR negative. Continue rocephin. WBC down slightly  11.  Elevated ALP, other LFTs are normal.  Trend.  12.  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  CT A/p 9/30 with: IMPRESSION: 1. Multiple hepatic masses consistent with metastases. The most likely primary is gastric given the circumferentially thickened mid to distal stomach, and hypogastric and gastrohepatic ligament adenopathy. 2. 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 3. Small amount of pelvic ascites.  - Called patient's daughter as above, left voicemail to review results. - Oncology consult placed for today; GI consult also placed for biopsy.   -  10/2: CEA, CA-19 pending. Daughter declined EGD for biopsy this AM. Per Oncology, may be able to perform liver biopsy for pathology; will follow up with Dr Lanny.   - 10/3: Liver biopsy performed today; pathology pending.  Appreciate Dr. 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  Plan to continue rehab and reassess functional gains closer to discharge.  10/6: Biopsy results pending, CEA high, CA 19.9 within normal limits.    12.  Constipation.  Last bowel movement recorded 9-12.  Has been eating very little.  Will get KUB today.  10/3- Per pt's daughter, LBM was Wed/Th- and was soft- doesn't want me to give additional bowel meds  10/5- Still no BM- ate a t little more today- daughter doesn't want her ot get more bowel meds  LBM 10/1-2 per daughter  25.  Chronic steroid use.  On prednisone 5 mg daily since admission.  On chart review, uncertain reasoning, will need to clarify with daughter   LOS: 7 days A FACE TO FACE EVALUATION WAS PERFORMED  Joesph JAYSON Likes 08/07/2024, 8:21 AM

## 2024-08-08 DIAGNOSIS — I63512 Cerebral infarction due to unspecified occlusion or stenosis of left middle cerebral artery: Secondary | ICD-10-CM | POA: Diagnosis not present

## 2024-08-08 LAB — CBC
HCT: 24.4 % — ABNORMAL LOW (ref 36.0–46.0)
Hemoglobin: 7.6 g/dL — ABNORMAL LOW (ref 12.0–15.0)
MCH: 25.4 pg — ABNORMAL LOW (ref 26.0–34.0)
MCHC: 31.1 g/dL (ref 30.0–36.0)
MCV: 81.6 fL (ref 80.0–100.0)
Platelets: 422 K/uL — ABNORMAL HIGH (ref 150–400)
RBC: 2.99 MIL/uL — ABNORMAL LOW (ref 3.87–5.11)
RDW: 19.8 % — ABNORMAL HIGH (ref 11.5–15.5)
WBC: 14.7 K/uL — ABNORMAL HIGH (ref 4.0–10.5)
nRBC: 0 % (ref 0.0–0.2)

## 2024-08-08 LAB — SURGICAL PATHOLOGY

## 2024-08-08 NOTE — Discharge Instructions (Signed)
 Inpatient Rehab Discharge Instructions  Tiffany [name] Discharge date and time:  08/11/24  Activities/Precautions/ Functional Status: Activity: no lifting, driving, or strenuous exercise till cleared by MD   Diet: low fat, low cholesterol diet Wound Care: none needed   Functional status:  ___ No restrictions     ___ Walk up steps independently _X__ 24/7 supervision/assistance   ___ Walk up steps with assistance ___ Intermittent supervision/assistance  ___ Bathe/dress independently ___ Walk with walker     _X__ Bathe/dress with assistance ___ Walk Independently    ___ Shower independently _X__ Walk with assistance    ___ Shower with assistance _X__ No alcohol     ___ Return to work/school ________   Special Instructions:   COMMUNITY REFERRALS UPON DISCHARGE:    Home Health:   Therapies have been deferred at this time.   Medical Equipment/Items Ordered:3in1 bedside commode and wheelchair                                                 Agency/Supplier:[employer] [employer] [phone]    STROKE/TIA DISCHARGE INSTRUCTIONS SMOKING Cigarette smoking nearly doubles your risk of having a stroke & is the single most alterable risk factor  If you smoke or have smoked in the last 12 months, you are advised to quit smoking for your health. Most of the excess cardiovascular risk related to smoking disappears within a year of stopping. Ask you doctor about anti-smoking medications [place] Quit Line: 1-800-QUIT NOW Free Smoking Cessation Classes [phone]  CHOLESTEROL Know your levels; limit fat & cholesterol in your diet  Lipid Panel     Component Value Date/Time   CHOL 127 07/24/2024 0217   TRIG 47 07/24/2024 0217   HDL 45 07/24/2024 0217   CHOLHDL 2.8 07/24/2024 0217   VLDL 9 07/24/2024 0217   LDLCALC 73 07/24/2024 0217     Many patients benefit from treatment even if their cholesterol is at goal. Goal: Total Cholesterol (CHOL) less than 160 Goal:  Triglycerides (TRIG) less than  150 Goal:  HDL greater than 40 Goal:  LDL (LDLCALC) less than 100   BLOOD PRESSURE American Stroke Association blood pressure target is less that 120/80 mm/Hg  Your discharge blood pressure is:  BP: (!) 142/76 Monitor your blood pressure Limit your salt and alcohol intake Many individuals will require more than one medication for high blood pressure  DIABETES (A1c is a blood sugar average for last 3 months) Goal HGBA1c is under 7% (HBGA1c is blood sugar average for last 3 months)  Diabetes: No known diagnosis of diabetes    Lab Results  Component Value Date   HGBA1C 5.2 07/23/2024    Your HGBA1c can be lowered with medications, healthy diet, and exercise. Check your blood sugar as directed by your physician Call your physician if you experience unexplained or low blood sugars.  PHYSICAL ACTIVITY/REHABILITATION Goal is 30 minutes at least 4 days per week  Activity: No driving, Therapies: see above Return to work: N/A Activity decreases your risk of heart attack and stroke and makes your heart stronger.  It helps control your weight and blood pressure; helps you relax and can improve your mood. Participate in a regular exercise program. Talk with your doctor about the best form of exercise for you (dancing, walking, swimming, cycling).  DIET/WEIGHT Goal is to maintain a healthy weight  Your discharge  diet is:  Diet Order             Diet regular Room service appropriate? Yes; Fluid consistency: Thin  Diet effective now                   liquids Your height is:  Height: 5' 2 (157.5 cm) Your current weight is: Weight: 55.4 kg Your Body Mass Index (BMI) is:  BMI (Calculated): 22.33 Following the type of diet specifically designed for you will help prevent another stroke. You are at goal weight    Your goal Body Mass Index (BMI) is 19-24. Healthy food habits can help reduce 3 risk factors for stroke:  High cholesterol, hypertension, and excess weight.  RESOURCES Stroke/Support  Group:  Call [phone]   STROKE EDUCATION PROVIDED/REVIEWED AND GIVEN TO PATIENT Stroke warning signs and symptoms How to activate emergency medical system (call 911). Medications prescribed at discharge. Need for follow-up after discharge. Personal risk factors for stroke. Pneumonia vaccine given:  Flu vaccine given:  My questions have been answered, the writing is legible, and I understand these instructions.  I will adhere to these goals & educational materials that have been provided to me after my discharge from the hospital.     My questions have been answered and I understand these instructions. I will adhere to these goals and the provided educational materials after my discharge from the hospital.  Patient/Caregiver Signature _______________________________ Date __________  Clinician Signature _______________________________________ Date __________  Please bring this form and your medication list with you to all your follow-up doctor's appointments.

## 2024-08-08 NOTE — Patient Care Conference (Signed)
 Inpatient RehabilitationTeam Conference and Plan of Care Update Date: 08/08/2024   Time: 1050 am    Patient Name: Tiffany [name]      Medical Record Number: [id number]  Date of Birth: 19-Feb-1941 Sex: Female         Room/Bed: 4W07C/4W07C-01 Payor Info: Payor: [employer] MEDICARE / Plan: [employer] MEDICARE CHOICE PPO / Product Type: *No Product type* /    Admit Date/Time:  07/31/2024  3:32 PM  Primary Diagnosis:  Acute ischemic left middle cerebral artery (MCA) stroke [facility])  Hospital Problems: Principal Problem:   Acute ischemic left middle cerebral artery (MCA) stroke (HCC) Active Problems:   Cognitive and neurobehavioral dysfunction   Acute on chronic anemia   Gastric mass   Abnormal finding on GI tract imaging    Expected Discharge Date: Expected Discharge Date: 08/11/24  Team Members Present: Physician leading conference: Dr. Joesph Likes Social Worker Present: Graeme Jude, LCSW Nurse Present: Eulalio Falls, RN PT Present: Elam Ohara, PT OT Present: Nereida Habermann, OT SLP Present: Recardo Mole, SLP PPS Coordinator present : Eleanor Colon, SLP     Current Status/Progress Goal Weekly Team Focus  Bowel/Bladder   Patient is continent of bowel and bladder   Paitent will remain continent of bowel and bladder   Round to provide toileting. Assist patient promptly when called to use the bathroom    Swallow/Nutrition/ Hydration               ADL's   CGA-Min A for overall ADL routine and ambulatory transfers. Barriers: Decreased activity tolerance, decreased awareness, aphasia.   Supervision downgrade to Min A   Caregiver education, general conditioning, activity tolerance, discharge planning    Mobility   decreased participation - per notes pt minA for bed mobility and transfers   supervision  barriers: lethargy, low motivation; focus on OOB tolerance, gait training, global strength training    Communication   severe fluent aphasia remains - limited  participation   maxA   Y/N questions, single step commands, functional naming    Safety/Cognition/ Behavioral Observations  difficult to assess given severity of deficits            Pain   Patient denies pain.   Patient will remain pain free or less than 3/10.   Assess pain every shift and as needed. Provide pain intervention when necessary.    Skin   No skin breakdown noted.   Patient will remain free of skin breakdown.  Assess skin every shift and as needed.      Discharge Planning:  Pt will d/c to home with her dtr who is primary caregiver during the week and support from granddaughters as well. Pt son in law will help on the weekend when pt dtr is at work. SW will confirm there are no barriers to discharge.    Team Discussion: Patient was admitted post stroke embolic left temporal-occipital infarct. Patient with metastatic adenocarcinoma, PE's. Progress limited by poor po intakes, pain , poor participation / tolerance with therapy, lack of motivation and severe fluent aphasia.   Patient on target to meet rehab goals: No, patient with decreased activity tolerance. Patient needs min assistance  with ADLs, transfers and mobility. Cognition difficult to assess due to severity of deficits. Max - total assistance. Overall goals at discharge are set for min - max assistance.   *See Care Plan and progress notes for long and short-term goals.   Revisions to Treatment Plan:  Downgraded goals Q day therapy  IVC filter  Teaching Needs: Safety, medications, transfers, toileting, etc   Current Barriers to Discharge: Decreased caregiver support, Home enviroment access/layout, and Behavior  Possible Resolutions to Barriers: Family Education Home health follow up     Medical Summary Current Status: medically complicated by metastatic adenocarcinoma, PEs/DVTs/embolic stroke s/p IVC placement, aphasia, anemia, leukocytosis on IV antibiotics, tachycardia, malnutiriton, and  constipation  Barriers to Discharge: Behavior/Mood;Inadequate Nutritional Intake;Medical stability;Self-care education;Symptomatic Anemia;Pending chemo/radiation;Uncontrolled Pain   Possible Resolutions to [employer] Focus: workup for worsening anemia, monitorring labs for leukocytosis and s/s infection on IV abx, encourage PO intakes, titratew bowel medications, follow up on pathology results with family   Continued Need for Acute Rehabilitation Level of Care: The patient requires daily medical management by a physician with specialized training in physical medicine and rehabilitation for the following reasons: Direction of a multidisciplinary physical rehabilitation program to maximize functional independence : Yes Medical management of patient stability for increased activity during participation in an intensive rehabilitation regime.: Yes Analysis of laboratory values and/or radiology reports with any subsequent need for medication adjustment and/or medical intervention. : Yes   I attest that I was present, lead the team conference, and concur with the assessment and plan of the team.   Saad Buhl Gayo 08/08/2024, 1050 am

## 2024-08-08 NOTE — Plan of Care (Signed)
  Problem: RH Balance Goal: LTG Patient will maintain dynamic standing balance (PT) Description: LTG:  Patient will maintain dynamic standing balance with assistance during mobility activities (PT) Flowsheets (Taken 08/08/2024 1053) LTG: Pt will maintain dynamic standing balance during mobility activities with:: Contact Guard/Touching assist Note: Downgraded due to progress up to this point   Problem: Sit to Stand Goal: LTG:  Patient will perform sit to stand with assistance level (PT) Description: LTG:  Patient will perform sit to stand with assistance level (PT) Flowsheets (Taken 08/08/2024 1053) LTG: PT will perform sit to stand in preparation for functional mobility with assistance level: Contact Guard/Touching assist Note: Downgraded due to progress up to this point   Problem: RH Bed to Chair Transfers Goal: LTG Patient will perform bed/chair transfers w/assist (PT) Description: LTG: Patient will perform bed to chair transfers with assistance (PT). Flowsheets (Taken 08/08/2024 1053) LTG: Pt will perform Bed to Chair Transfers with assistance level: Contact Guard/Touching assist Note: Downgraded due to progress up to this point   Problem: RH Car Transfers Goal: LTG Patient will perform car transfers with assist (PT) Description: LTG: Patient will perform car transfers with assistance (PT). Flowsheets (Taken 08/08/2024 1053) LTG: Pt will perform car transfers with assist:: Minimal Assistance - Patient > 75% Note: Downgraded due to progress up to this point   Problem: RH Ambulation Goal: LTG Patient will ambulate in controlled environment (PT) Description: LTG: Patient will ambulate in a controlled environment, # of feet with assistance (PT). Flowsheets (Taken 08/08/2024 1053) LTG: Pt will ambulate in controlled environ  assist needed:: Minimal Assistance - Patient > 75% LTG: Ambulation distance in controlled environment: 100' Note: Downgraded due to progress up to this point Goal:  LTG Patient will ambulate in home environment (PT) Description: LTG: Patient will ambulate in home environment, # of feet with assistance (PT). Flowsheets (Taken 08/08/2024 1053) LTG: Pt will ambulate in home environ  assist needed:: Minimal Assistance - Patient > 75% Note: Downgraded due to progress up to this point   Problem: RH Stairs Goal: LTG Patient will ambulate up and down stairs w/assist (PT) Description: LTG: Patient will ambulate up and down # of stairs with assistance (PT) Flowsheets (Taken 08/08/2024 1053) LTG: Pt will ambulate up/down stairs assist needed:: Moderate Assistance - Patient 50 - 74% Note: Downgraded due to progress up to this point

## 2024-08-08 NOTE — Progress Notes (Signed)
 Patient ID: Seriyah Collison, female   DOB: 1941/02/20, 83 y.o.   MRN: [id number]  SW went by pt room but no family in the room.   1532- SW left message for pt dtr requesting follow-up.   10- SW spoke with pt dtr to provide updates from team conference, discuss discharge process, d/c date 10/10, and HH therapy recommended. She shared she will discuss with family if they would ike to move forward with [facility] therapy and will confirm with SW. SW emailed [facility] list. SW shared above with team.   Graeme Jude, MSW, LCSW Office: [phone] Cell: [phone] Fax: [phone]

## 2024-08-08 NOTE — Progress Notes (Signed)
 Speech Language Pathology Daily Session Note  Patient Details  Name: Tiffany [name] MRN: [id number] Date of Birth: January 14, 1941  Today's Date: 08/08/2024 SLP Individual Time: 1500-1536 SLP Individual Time Calculation (min): 36 min  Short Term Goals: Week 1: SLP Short Term Goal 1 (Week 1): STGs = LTGs d/t ELOS  Skilled Therapeutic Interventions:   Pt and family greeted at bedside for tx targeting language and family education. She was awake upon SLP arrival, though visibly upset. When asked additional questions re her frustrations/crying, she was able to report I want to go and let me. Pt's daughter assisted to report that pt was upset re d/c date of 10/10. 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 SLP also provided education re functional language tasks for the home environment w/ her daughter and provided examples. Pt unable to participate in education given emotional state. Additional questions were answered and pt was left w/ her alarm set and call light within reach. Recommend cont ST per POC.   Pain  Unable to report - appeared comfortable   Therapy/Group: Individual Therapy  [name] 08/08/2024, 4:29 PM

## 2024-08-08 NOTE — Progress Notes (Signed)
 Physical Therapy Session Note  Patient Details  Name: Tiffany [name] MRN: [id number] Date of Birth: December 08, 1940  Today's Date: 08/08/2024 PT Individual Time: 1302-1347 PT Individual Time Calculation (min): 45 min   Short Term Goals: Week 1:  PT Short Term Goal 1 (Week 1): STG = LTG due to ELOS  Skilled Therapeutic Interventions/Progress Updates:    Pt presents in room in bed, requires significant encouragement from therapist and daughter for participation with therapies this session. Session focused on family education for current functional status as well as education on DC planning. Pt changes pants in supine with supervision and set up assist. Pt completes supine to sit EOB with supervision, increased time due to pt with low motivation and decreased initiation. Pt completes transfers with CGA with and without RW throughout session. Pt ambulates without device with CGA/min assist up to 100', demonstrating increased shuffling gait noted and decreased BLE foot clearance causing LOB to R. Pt then ambulates 92' with RW with CGA, improved postural stability and therapist educates that RW will be safest option for ambulation at home at this time. Pt daughter educated on getting WC for DC for community use due to pt with poor endurance and upright tolerance. Pt completes up/down 8 steps with LHR with CGA cues for decreasing speed on steps and taking one step at a time with pt demonstrating poor understanding of this however no LOB. Pt completes car trasnfer with CGA to simulated personal vehicle height. Pt returns to room and remains seated in [facility] with all needs within reach, cal light in place and pt family present at end of session.   Therapy Documentation Precautions:  Precautions Precautions: Fall Recall of Precautions/Restrictions: Impaired Precaution/Restrictions Comments: Aphasia Restrictions Weight Bearing Restrictions Per Provider Order: No   Therapy/Group: Individual Therapy  Reche Ohara PT, DPT 08/08/2024, 4:57 PM

## 2024-08-08 NOTE — Progress Notes (Signed)
 Physical Therapy Note  Patient Details  Name: Tiffany [name] MRN: [id number] Date of Birth: Aug 18, 1941 Today's Date: 08/08/2024   Today's Date: 08/08/2024 PT Missed Time: 30 Minutes Missed Time Reason: Patient unwilling to participate   Pt presents in room in bed, refusing therapy this AM stating my daughter is coming and I am going HOME. Therapist attempts to educate and reorient to therapy schedule however pt continues to adamantly refuse. RN recommends morning block in patient's schedule due to poor participation in AM and improved participation later in morning and in afternoon, this therapist communicates with scheduler. Therapist will reattempt as schedule allows.   Reche Ohara PT, DPT 08/08/2024, 8:17 AM

## 2024-08-08 NOTE — Plan of Care (Signed)
 Patient Details  Name: Tiffany [name] MRN: [id number] Date of Birth: [birth year] Today's Date: 08/08/2024    Pt's plan of care adjusted to Q.D. after speaking with care team and discussed with MD in team conference as pt currently unable to tolerate current therapy schedule with OT, PT, and SLP.     Reche Ohara PT, DPT 08/08/2024, 10:55 AM

## 2024-08-08 NOTE — Progress Notes (Addendum)
 Tiffany [name]   DOB:1941/02/09   FM#:[id number]      ASSESSMENT & PLAN:  Tiffany [name] is an 83 year old female patient with oncologic history significant for metastatic gastric adenocarcinoma.  She presented to ED on 9/29 with functional deficits due to stroke.  Medical Oncology/Dr. Lanny following.   Adenocarcinoma, likely gastric with liver and peritoneal mets --Imaging 08/01/2024 shows multiple hepatic masses consistent with mets.  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  Peritoneal mets in LUQ noted with small pelvic ascites. -- Tumor markers; CEA elevated 326.  CA19-9 WNL.   -- Path of liver lesions confirms metastatic moderately differentiated adenocarcinoma, likely gastric primary -- Further testing will be done as outpatient by Dr. Lanny. Patient's daughter agreeable to outpatient oncology follow up in 2 weeks after discharge for further evaluation.  -- Medical Oncology/Dr. Lanny following closely.   PE, RUL and RLL Acute DVT, RLE -- RLE DVT seen on duplex 9/9. Had been started on Eliquis . -- Status post IV heparin this admission.  -- Status post IVC filter placement 10/3 -- Now on low dose Lovenox  40 mg. Okay to discharge on Lovenox , patient's daughter states she is agreeable to administering.  -- Monitor closely for bleeding   Embolic stroke Aphasia, improving - Patient now responding with 2 to 3 word responses. -- Recent large left infarct and multiple smaller infarcts.  - Neuro, Neuropsych, PT/OT Teams following   Generalized pain - Patient previously complained of pain in her head and abdomen. Today during exam, patient denied pain.  - Likely secondary to malignancy - Continue supportive care   Anemia -- Hemoglobin low 7.6.   - Likely due to malignancy. -- Recommend PRBC transfusion for HGB <7.0 -- Continue to  monitor CBC with differential   Thrombocytosis -- Improving -- Likely reactive -- Platelets 422K -- Continue to monitor CBC with differential    Code  Status Full   Subjective:  Patient seen awake and alert sitting up in chair at bedside. Patient's daughter and granddaughter are at bedside.  Reports that she feels okay.  Daughter states that she is eating and drinking a little better. States that patient has been refusing physical therapy when she (daughter) is not around.  No other complaints offered.   Objective:   Intake/Output Summary (Last 24 hours) at 08/08/2024 1423 Last data filed at 08/08/2024 1324 Gross per 24 hour  Intake 200 ml  Output --  Net 200 ml     PHYSICAL EXAMINATION: ECOG PERFORMANCE STATUS: 3 - Symptomatic, >50% confined to bed  Vitals:   08/08/24 0424 08/08/24 1258  BP: 126/77 117/75  Pulse: 96 86  Resp: 16 18  Temp: 98.5 F (36.9 C) 98.6 F (37 C)  SpO2: 98% 99%   Filed Weights   07/31/24 1530  Weight: 122 lb 2.2 oz (55.4 kg)    GENERAL: alert, no distress and comfortable +chronically ill-appearing SKIN: skin color, texture, turgor are normal, no rashes or significant lesions EYES: normal, conjunctiva are pink and non-injected, sclera clear OROPHARYNX: no exudate, no erythema and lips, buccal mucosa, and tongue normal  NECK: supple, thyroid normal size, non-tender, without nodularity LYMPH: no palpable lymphadenopathy in the cervical, axillary or inguinal LUNGS: clear to auscultation and percussion with normal breathing effort HEART: regular rate & rhythm and no murmurs and no lower extremity edema ABDOMEN: abdomen soft, non-tender and normal bowel sounds MUSCULOSKELETAL: no cyanosis of digits and no clubbing  PSYCH: alert & oriented +minimal verbal responses NEURO: no focal motor/sensory deficits   All  questions were answered. The patient knows to call the clinic with any problems, questions or concerns.   The total time spent in the appointment was 30 minutes encounter with patient including review of chart and various tests results, discussions about plan of care and coordination of care  plan  Olam JINNY Brunner, NP 08/08/2024 2:23 PM    Labs Reviewed:  Lab Results  Component Value Date   WBC 14.7 (H) 08/08/2024   HGB 7.6 (L) 08/08/2024   HCT 24.4 (L) 08/08/2024   MCV 81.6 08/08/2024   PLT 422 (H) 08/08/2024   Recent Labs    07/22/24 2221 07/22/24 2303 07/24/24 0217 07/25/24 0434 08/01/24 0527 08/03/24 0538 08/07/24 0615  NA 138   < > 138   < > 139 138 141  K 3.6   < > 3.8   < > 3.7 3.9 3.5  CL 105   < > 105   < > 105 108 110  CO2 18*  --  20*   < > 21* 21* 21*  GLUCOSE 98   < > 93   < > 91 85 95  BUN 15   < > 15   < > 18 15 22   CREATININE 0.83   < > 0.88   < > 0.85 0.84 0.85  CALCIUM 8.7*  --  8.2*   < > 8.6* 8.2* 8.4*  GFRNONAA >60  --  >60   < > >60 >60 >60  PROT 7.8  --  6.5  --  6.2*  --   --   ALBUMIN 2.7*  --  2.3*  --  2.1*  --   --   AST 25  --  23  --  37  --   --   ALT 16  --  15  --  28  --   --   ALKPHOS 149*  --  141*  --  171*  --   --   BILITOT 0.9  --  1.2  --  0.5  --   --    < > = values in this interval not displayed.    Studies Reviewed:  DG CHEST PORT 1 VIEW Result Date: 08/06/2024 EXAM: 1 VIEW(S) XRAY OF THE CHEST 08/06/2024 08:47:00 AM COMPARISON: 10/17/2023 CLINICAL HISTORY: Acute ischemic left middle cerebral artery (MCA) stroke (HCC). FINDINGS: LUNGS AND PLEURA: Low lung volumes. No focal pulmonary opacity. No pulmonary edema. No pleural effusion. No pneumothorax. HEART AND MEDIASTINUM: Aortic atherosclerosis. No acute abnormality of the cardiac and mediastinal silhouettes. BONES AND SOFT TISSUES: No acute osseous abnormality. IMPRESSION: 1. No acute cardiopulmonary abnormality 2. Aortic atherosclerosis 3. Low lung volumes Electronically signed by: Katheleen Faes MD 08/06/2024 09:37 AM EDT RP Workstation: [id number]   IR IVC FILTER PLMT / S&I PORTER GUID/MOD SED Result Date: 08/04/2024 INDICATION: 83 year old with evidence for intra-abdominal neoplastic disease. 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  Request for a IVC filter because the patient is not a good anticoagulation candidate. EXAM: IVC FILTER PLACEMENT; IVC VENOGRAM; ULTRASOUND FOR VASCULAR ACCESS Physician: Juliene SAUNDERS. Henn, MD MEDICATIONS: Moderate sedation ANESTHESIA/SEDATION: Moderate (conscious) sedation was employed during this procedure. A total of Versed 0.5 mg and fentanyl 25 mcg was administered intravenously at the order of the provider performing the procedure. Total intra-service moderate sedation time: 15 minutes. Patient's level of consciousness and vital signs were monitored continuously by radiology nurse throughout the procedure under the supervision of the provider performing the procedure. CONTRAST:  50 mL Omnipaque 300 FLUOROSCOPY: Radiation Exposure Index (as provided by the fluoroscopic device): 14 mGy Kerma COMPLICATIONS: None immediate. PROCEDURE: Informed consent was obtained for an IVC venogram and filter placement. Ultrasound demonstrated a patent right internal jugular vein. Ultrasound images were obtained for documentation. The right neck was prepped and draped in a sterile fashion. Maximal barrier sterile technique was utilized including caps, mask, sterile gowns, sterile gloves, sterile drape, hand hygiene and skin antiseptic. The skin was anesthetized with 1% lidocaine. A 21 gauge needle was directed into the vein with ultrasound guidance and a micropuncture dilator set was placed. A wire was advanced into the IVC. The filter sheath was advanced over the wire into the IVC. An IVC venogram was performed. Fluoroscopic images were obtained for documentation. A Bard Denali filter was deployed below the lowest renal vein. A follow-up venogram was performed and the vascular sheath was removed with manual compression. FINDINGS: IVC was patent. Bilateral renal veins were identified. The filter was deployed below the lowest renal vein. Follow-up venogram confirmed placement within the IVC and below the renal veins. IMPRESSION:  Successful placement of a retrievable IVC filter. PLAN: Due to patient related comorbidities and/or clinical necessity, this IVC filter should be considered a permanent device. This patient will not be actively followed for future filter retrieval. Electronically Signed   By: Juliene Balder M.D.   On: 08/04/2024 16:13   IR US  LIVER BIOPSY Result Date: 08/04/2024 INDICATION: 83 year old with liver lesions and needs a tissue diagnosis. EXAM: ULTRASOUND-GUIDED LIVER LESION BIOPSY MEDICATIONS: Moderate sedation ANESTHESIA/SEDATION: Moderate (conscious) sedation was employed during this procedure. A total of Versed 0.5 mg and Fentanyl 50 mcg was administered intravenously by the radiology nurse. Total intra-service moderate Sedation Time: 18 minutes. The patient's level of consciousness and vital signs were monitored continuously by radiology nursing throughout the procedure under my direct supervision. FLUOROSCOPY TIME:  None COMPLICATIONS: None immediate. PROCEDURE: Informed written consent was obtained from the patient/family after a thorough discussion of the procedural risks, benefits and alternatives. All questions were addressed. Maximal Sterile Barrier Technique was utilized including caps, mask, sterile gowns, sterile gloves, sterile drape, hand hygiene and skin antiseptic. A timeout was performed prior to the initiation of the procedure. Abdomen was evaluated with ultrasound. A right hepatic lesion was identified for biopsy. The right side of the abdomen was prepped with chlorhexidine and sterile field was created. Skin was anesthetized using 1% lidocaine. Small incision was made. Using ultrasound guidance, 17 gauge coaxial needle was directed into the right hepatic lesion. Total of 3 core biopsies were performed with an 18 gauge core device. Three adequate specimens obtained and placed in formalin. Gel-Foam slurry was injected as the 17 gauge needle was removed. Bandage placed over the puncture site. FINDINGS:  Heterogeneous hepatic lesions. A large lesion in the right hepatic lobe with central necrosis was biopsied. 3 adequate specimens were obtained. No immediate bleeding or hematoma formation. IMPRESSION: Ultrasound-guided core biopsy of a right hepatic lesion. Electronically Signed   By: Juliene Balder M.D.   On: 08/04/2024 16:07   DG Abd 1 View Result Date: 08/04/2024 CLINICAL DATA:  Constipation. EXAM: ABDOMEN - 1 VIEW COMPARISON:  CT 08/01/2024 FINDINGS: No bowel dilatation or evidence of obstruction. Small to moderate volume of formed stool throughout the colon, diminished stool burden from prior CT. Calcifications in the right pelvis correspond to retroperitoneal phleboliths. Overlying artifacts project over the pelvis. IMPRESSION: Small to moderate volume of formed stool throughout the colon, diminished stool burden from  prior CT. Electronically Signed   By: Andrea Gasman M.D.   On: 08/04/2024 13:54   CT ABDOMEN PELVIS W CONTRAST Result Date: 08/02/2024 CLINICAL DATA:  The patient had a CTA chest earlier today for pulmonary embolism. A mass was noted in the central liver as well as evidence of peritoneal metastases in the left upper abdomen. EXAM: CT ABDOMEN AND PELVIS WITH CONTRAST TECHNIQUE: Multidetector CT imaging of the abdomen and pelvis was performed using the standard protocol following bolus administration of intravenous contrast. RADIATION DOSE REDUCTION: This exam was performed according to the departmental dose-optimization program which includes automated exposure control, adjustment of the mA and/or kV according to patient size and/or use of iterative reconstruction technique. CONTRAST:  60mL OMNIPAQUE IOHEXOL 350 MG/ML SOLN COMPARISON:  Ultrasound complete abdomen 01/12/2019 was negative except for a 5 cm left renal cyst. The only other cross-sectional comparison is today's CTA chest. FINDINGS: Lower chest: There is mild cardiomegaly, minimal pericardial effusion. Scattered three-vessel  coronary calcifications. Lung bases are clear apart from posterior atelectatic change in the lower lobes. Hepatobiliary: There are multiple hepatic masses consistent with metastases. The most likely primary is gastric given the observation of a circumferentially thickened mid to distal stomach and gastrohepatic ligament adenopathy. The largest mass appears to be a coalescence of at least 3 masses involving portions of segments 5 and 8. This measures 7.7 x 6.6 cm on 3:18. There is a segment 4 mass measuring 2.8 x 3.4 cm on 3:25, and a segment 1 mass significantly narrowing the intrahepatic IVC measuring 4.3 x 4.1 cm on 3:17. Out laterally in segment 2 there is another mass measuring 5.4 x 3.3 cm on 3:17. There are few additional scattered small masses in both lobes. The gallbladder and bile ducts are unremarkable. Pancreas: No abnormality Spleen: No abnormality. Adrenals/Urinary Tract: There is no adrenal mass. In the left upper pole there is a 4.1 cm homogeneous Bosniak 2 cyst, Hounsfield density is 22.2. A second Bosniak 2 cyst just below this measures 1.2 cm and 23 Hounsfield units, and on the right there is a third parapelvic Bosniak 2 cyst in the inferior pole measuring 1.9 cm on 23 Hounsfield units. No follow-up imaging is recommended. There is no discrete renal mass enhancement. There is contrast in the collecting systems and ureters which would obscure stones but there is no hydronephrosis. The bladder wall and lumen are unremarkable. There is dense contrast opacifying the bladder. Stomach/Bowel: Circumferentially thickened wall in the mid distal stomach particularly posteriorly and inferiorly, suspicious for infiltrating disease such as carcinoma. The small bowel is normal caliber. The appendix is normal. There is moderate retained stool. No overt colonic wall thickening. Scattered uncomplicated diverticulosis. Vascular/Lymphatic: Aortic atherosclerosis. No AAA or acute vascular findings. There is mass  effect on the intrahepatic IVC due to a segment 1 liver metastasis described above. No thrombus is seen within it. The portal vein and branches are widely patent. There is an enlarged necrotic gastrohepatic ligament lymph measuring 2.4 x 2.2 cm on 3:21. There are pathologic lymph nodes inferior to the stomach as well, ranging from 9 mm short axis up to the largest right of the midline measuring 2.4 x 1.6 cm on 3:38. Additional evidence of peritoneal metastatic disease with left upper quadrant nodular peritoneal metastases measuring up to 1.6 x 1.5 cm on 3:26. No adenopathy is seen in the pelvis. Reproductive: Status post hysterectomy. No adnexal masses. Other: Small amount of pelvic ascites. No drainable pocket. No free air or free hemorrhage. Small  umbilical fat hernia. Musculoskeletal: No regional bone metastasis is seen. Mild degenerative change thoracic and lumbar spine. IMPRESSION: 1. Multiple hepatic masses consistent with metastases. The most likely primary is gastric given the circumferentially thickened mid to distal stomach, and hypogastric and gastrohepatic ligament adenopathy. 2. 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 3. Small amount of pelvic ascites. 4. Aortic and coronary artery atherosclerosis. 5. Cardiomegaly with minimal pericardial effusion. 6. Umbilical fat hernia. 7. These results will be called to the ordering clinician or representative by the Radiologist Assistant, and communication documented in the PACS or [employer]. Aortic Atherosclerosis (ICD10-I70.0). Electronically Signed   By: Francis Quam M.D.   On: 08/02/2024 00:35   CT Angio Chest Pulmonary Embolism (PE) W or WO Contrast Result Date: 08/01/2024 CLINICAL DATA:  Fever tachycardia EXAM: CT ANGIOGRAPHY CHEST WITH CONTRAST TECHNIQUE: Multidetector CT imaging of the chest was performed using the standard protocol during bolus administration of intravenous contrast. Multiplanar CT image reconstructions and MIPs were  obtained to evaluate the vascular anatomy. RADIATION DOSE REDUCTION: This exam was performed according to the departmental dose-optimization program which includes automated exposure control, adjustment of the mA and/or kV according to patient size and/or use of iterative reconstruction technique. CONTRAST:  60mL OMNIPAQUE IOHEXOL 350 MG/ML SOLN COMPARISON:  Chest x-ray 10/17/2023 FINDINGS: Cardiovascular: Satisfactory opacification of the pulmonary arteries to the segmental level. Small emboli within right upper lobe subsegmental branches. Small volume right lower lobe segmental and subsegmental PE. Mild aortic atherosclerosis. No aneurysm. Mild coronary vascular calcification. Borderline cardiomegaly. No pericardial effusion Mediastinum/Nodes: Patent trachea. No thyroid mass. No suspicious lymph nodes. Esophagus within normal limits except for small hiatal hernia Lungs/Pleura: Mild dependent atelectasis. No consolidation or pleural effusion. Punctate 2 mm lingular pulmonary nodule, no specific imaging follow-up is recommended. Upper Abdomen: Incompletely visualized is vague hypodensity in the central liver, series 5 image 114. Soft tissue nodularity within the left upper quadrant incompletely assessed, series 5 image 91 through 114. Musculoskeletal: No acute osseous abnormality. 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 IMPRESSION: 1. Small volume right upper lobe and right lower lobe segmental and subsegmental pulmonary emboli. 2. Incompletely visualized vague hypodensity in the central liver, raising concern for possible liver lesion/mass. In addition, there is incompletely visualized nodularity within the left upper quadrant as could be seen with peritoneal metastatic disease. Recommend dedicated abdominopelvic contrast-enhanced CT. 3. Critical Value/emergent results were called by  telephone at the time of interpretation on 08/01/2024 at 8:52 pm to provider NP Debby , who verbally acknowledged these results. Aortic Atherosclerosis (ICD10-I70.0). Electronically Signed   By: Luke Bun M.D.   On: 08/01/2024 20:53   VAS US  LOWER EXTREMITY VENOUS (DVT) Result Date: 07/25/2024  Lower Venous DVT Study Patient Name:  Tiffany [name]  Date of Exam:   07/25/2024 Medical Rec #: [id number]         Accession #:    [id number] Date of Birth: Oct 21, 1941          Patient Gender: F Patient Age:   74 years Exam Location:  [facility] Procedure:      VAS US  LOWER EXTREMITY VENOUS (DVT) Referring Phys: HARLENE BOWL --------------------------------------------------------------------------------  Indications: Superficial venous thrombosis (SVT) I82.819.  Risk Factors: None identified. Comparison Study: No prior studies. Performing Technologist: Cordella Collet RVT  Examination Guidelines: A complete evaluation includes B-mode imaging, spectral Doppler, color Doppler, and power Doppler as  needed of all accessible portions of each vessel. Bilateral testing is considered an integral part of a complete examination. Limited examinations for reoccurring indications may be performed as noted. The reflux portion of the exam is performed with the patient in reverse Trendelenburg.  +---------+---------------+---------+-----------+----------+--------------+ RIGHT    CompressibilityPhasicitySpontaneityPropertiesThrombus Aging +---------+---------------+---------+-----------+----------+--------------+ CFV      Full           Yes      Yes                                 +---------+---------------+---------+-----------+----------+--------------+ SFJ      Full                                                        +---------+---------------+---------+-----------+----------+--------------+ FV Prox  Full                                                         +---------+---------------+---------+-----------+----------+--------------+ FV Mid   Full                                                        +---------+---------------+---------+-----------+----------+--------------+ FV DistalFull                                                        +---------+---------------+---------+-----------+----------+--------------+ PFV      Full                                                        +---------+---------------+---------+-----------+----------+--------------+ POP      None           No       No                   Acute          +---------+---------------+---------+-----------+----------+--------------+ PTV      Partial                                      Acute          +---------+---------------+---------+-----------+----------+--------------+ PERO     None                                         Acute          +---------+---------------+---------+-----------+----------+--------------+ Gastroc  Full                                                        +---------+---------------+---------+-----------+----------+--------------+   +----+---------------+---------+-----------+----------+--------------+  LEFTCompressibilityPhasicitySpontaneityPropertiesThrombus Aging +----+---------------+---------+-----------+----------+--------------+ CFV Full           Yes      Yes                                 +----+---------------+---------+-----------+----------+--------------+    Summary: RIGHT: - Findings consistent with acute deep vein thrombosis involving the right popliteal vein, right posterior tibial veins, and right peroneal veins.  - No cystic structure found in the popliteal fossa.  LEFT: - No evidence of common femoral vein obstruction.   *See table(s) above for measurements and observations. Electronically signed by Debby Robertson on 07/25/2024 at 5:14:24 PM.    Final    ECHOCARDIOGRAM COMPLETE Result Date:  07/24/2024    ECHOCARDIOGRAM REPORT   Patient Name:   Tiffany [name] Date of Exam: 07/24/2024 Medical Rec #:  [id number]        Height:       60.0 in Accession #:    [id number]       Weight:       130.0 lb Date of Birth:  [birth year]         BSA:          1.554 m Patient Age:    83 years         BP:           122/67 mmHg Patient Gender: F                HR:           96 bpm. Exam Location:  Inpatient Procedure: 2D Echo, Cardiac Doppler and Color Doppler (Both Spectral and Color            Flow Doppler were utilized during procedure). Indications:   Abnormal ECG R94.31  History:       Patient has no prior history of Echocardiogram examinations.                Stroke.  Sonographer:   Thea Norlander RCS Referring      [facility] SAMTANI Phys: IMPRESSIONS  1. Left ventricular ejection fraction, by estimation, is 60 to 65%. The left ventricle has normal function. The left ventricle has no regional wall motion abnormalities. Left ventricular diastolic parameters were normal.  2. Right ventricular systolic function is normal. The right ventricular size is normal.  3. The mitral valve is normal in structure. No evidence of mitral valve regurgitation. No evidence of mitral stenosis.  4. The aortic valve is abnormal. There is mild thickening of the aortic valve with a small subcentimeter echodensity at the LV side of the valve. There is mild aortic valve regurgitation. Consider TEE for further evaluation in setting of recent CVA.  5. The inferior vena cava is normal in size with greater than 50% respiratory variability, suggesting right atrial pressure of 3 mmHg. FINDINGS  Left Ventricle: Left ventricular ejection fraction, by estimation, is 60 to 65%. The left ventricle has normal function. The left ventricle has no regional wall motion abnormalities. The left ventricular internal cavity size was normal in size. There is  no left ventricular hypertrophy. Left ventricular diastolic parameters were normal. Right Ventricle:  The right ventricular size is normal. No increase in right ventricular wall thickness. Right ventricular systolic function is normal. Left Atrium: Left atrial size was normal in size. Right Atrium: Right atrial size was normal in size. Pericardium: There is no evidence of pericardial effusion. Mitral Valve: The  mitral valve is normal in structure. No evidence of mitral valve regurgitation. No evidence of mitral valve stenosis. Tricuspid Valve: The tricuspid valve is normal in structure. Tricuspid valve regurgitation is not demonstrated. No evidence of tricuspid stenosis. Aortic Valve: The aortic valve is abnormal. There is mild thickening of the aortic valve. Aortic valve regurgitation is mild. No aortic stenosis is present. Aortic valve peak gradient measures 10.4 mmHg. Pulmonic Valve: The pulmonic valve was normal in structure. Pulmonic valve regurgitation is not visualized. No evidence of pulmonic stenosis. Aorta: The aortic root is normal in size and structure. Venous: The inferior vena cava is normal in size with greater than 50% respiratory variability, suggesting right atrial pressure of 3 mmHg. IAS/Shunts: No atrial level shunt detected by color flow Doppler.  LEFT VENTRICLE PLAX 2D LVIDd:         4.20 cm   Diastology LVIDs:         2.50 cm   LV e' medial:    9.46 cm/s LV PW:         0.90 cm   LV E/e' medial:  6.6 LV IVS:        0.90 cm   LV e' lateral:   14.70 cm/s LVOT diam:     2.00 cm   LV E/e' lateral: 4.2 LV SV:         51 LV SV Index:   33 LVOT Area:     3.14 cm  RIGHT VENTRICLE             IVC RV S prime:     13.80 cm/s  IVC diam: 1.40 cm TAPSE (M-mode): 1.9 cm LEFT ATRIUM             Index        RIGHT ATRIUM           Index LA diam:        4.30 cm 2.77 cm/m   RA Area:     10.50 cm LA Vol (A2C):   39.8 ml 25.61 ml/m  RA Volume:   18.63 ml  11.99 ml/m LA Vol (A4C):   26.8 ml 17.24 ml/m LA Biplane Vol: 30.1 ml 19.37 ml/m  AORTIC VALVE AV Area (Vmax): 1.92 cm AV Vmax:        161.00 cm/s AV Peak  Grad:   10.4 mmHg LVOT Vmax:      98.50 cm/s LVOT Vmean:     65.200 cm/s LVOT VTI:       0.163 m  AORTA Ao Root diam: 3.00 cm Ao Asc diam:  3.60 cm MITRAL VALVE MV Area (PHT): 5.31 cm    SHUNTS MV Decel Time: 143 msec    Systemic VTI:  0.16 m MV E velocity: 62.40 cm/s  Systemic Diam: 2.00 cm MV A velocity: 96.40 cm/s MV E/A ratio:  0.65 Aditya Sabharwal Electronically signed by Ria Commander Signature Date/Time: 07/24/2024/3:45:09 PM    Final    MR BRAIN WO CONTRAST Result Date: 07/24/2024 CLINICAL DATA:  Provided history: Stroke, follow-up. EXAM: MRI HEAD WITHOUT CONTRAST TECHNIQUE: Multiplanar, multiecho pulse sequences of the brain and surrounding structures were obtained without intravenous contrast. COMPARISON:  Non-contrast head CT and CT angiogram head/neck 07/23/2024. FINDINGS: Intermittently motion degraded examination. Most notably, the axial T1 sequence is moderate to severely motion degraded and the coronal T2 sequence is severely motion degraded. Within this limitation, findings are swallows. Brain: Generalized cerebral atrophy. Large acute left MCA territory infarct affecting the posterior insula, posterior frontal lobe,  parietal lobe and occipital lobe. This infarct does not appear significantly changed in extent as compared to yesterday's head CT. No significant mass effect at this time. No evidence of hemorrhagic conversion. Tiny acute cortical infarct within the perirolandic right frontoparietal region. Tiny acute infarct within the right parietal white matter (series 2, image 36). Mild multifocal T2 FLAIR hyperintense signal abnormality elsewhere within the cerebral white matter and pons, nonspecific but compatible chronic small ischemic disease. Redemonstrated chronic microhemorrhage within the left occipital lobe. Tiny acute infarcts within the bilateral cerebellar hemispheres. probable small (2-3 mm) meningioma along the right petrous apex better appreciated on the prior brain MRI of  12/28/2021 (series 6, image 8 of the current exam). No extra-axial fluid collection. No midline shift. Vascular: Please see the CTA head/neck performed yesterday. Skull and upper cervical spine: No focal race marrow lesion. Sinuses/Orbits: No mass or acute finding within the imaged orbits. Prior bilateral ocular lens replacement. No significant paranasal sinus disease. IMPRESSION: 1. Intermittently motion degraded examination as described. Within this limitation, findings are as follows. 2. Large acute left MCA territory infarct, as described and not significantly changed in extent since yesterday's head CT. No significant mass effect at this time. No evidence of hemorrhagic conversion. 3. Additional tiny acute infarcts within the perirolandic right frontoparietal cortex, right parietal white matter and bilateral cerebellar hemispheres. 4. Background parenchymal atrophy and chronic small vessel ischemic disease. 5. 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 Electronically Signed   By: Rockey Childs D.O.   On: 07/24/2024 13:13   CT ANGIO HEAD NECK W WO CM Result Date: 07/23/2024 CLINICAL DATA:  83 year old female with confusion, abnormal speech. Recently started on Eliquis . Severe anemia. Cytotoxic edema in the posterior left hemisphere on head CT last night. EXAM: CT ANGIOGRAPHY HEAD AND NECK WITH AND WITHOUT CONTRAST TECHNIQUE: Multidetector CT imaging of the head and neck was performed using the standard protocol during bolus administration of intravenous contrast. Multiplanar CT image reconstructions and MIPs were obtained to evaluate the vascular anatomy. Carotid stenosis measurements (when applicable) are obtained utilizing NASCET criteria, using the distal internal carotid diameter as the denominator. RADIATION DOSE REDUCTION: This exam was performed according to the departmental dose-optimization program which includes automated exposure  control, adjustment of the mA and/or kV according to patient size and/or use of iterative reconstruction technique. CONTRAST:  OMNIPAQUE IOHEXOL 350 MG/ML SOLN COMPARISON:  Head CT 2302 hours yesterday. FINDINGS: CT HEAD Brain: Confluent cytotoxic edema posterior left posterior MCA versus MCA/PCA watershed area appears stable and size and extent since last night. No associated mass effect. No hemorrhagic transformation. Stable gray-white matter differentiation throughout the brain. No intracranial mass effect or midline shift. Stable nonspecific ventriculomegaly. Basilar cisterns remain normal. Calvarium and skull base: Intact. No acute osseous abnormality identified. Paranasal sinuses: Visualized paranasal sinuses and mastoids are stable and well aerated. Orbits: No gaze deviation.  Stable orbit and scalp soft tissues. CTA NECK Skeleton: Levoconvex cervical scoliosis. No acute osseous abnormality identified. Incidental benign appearing right subscapularis intramuscular lipoma (series 5, image 303). Upper chest: Negative. Other neck: Nonvascular neck soft tissue spaces appear negative. Aortic arch: Mild arch tortuosity and calcified plaque. Bovine arch configuration. Right carotid system: Tortuous brachiocephalic artery and right CCA origin without plaque or stenosis. Negative right carotid bifurcation. No stenosis. Left carotid system: Bovine left CCA origin with tortuosity. Mild calcified plaque at the posterior left ICA origin. Mildly tortuous left ICA without stenosis. Vertebral  arteries: Tortuous proximal right subclavian artery without plaque or stenosis. Minimal calcified plaque at the right vertebral artery origin without stenosis. Tortuous right vertebral artery is patent to the neck with no other plaque or stenosis. Proximal left subclavian artery atherosclerosis and tortuosity, less than 50 % stenosis with respect to the distal vessel. Normal left vertebral artery origin. Tortuous left V1 segment.  Codominant left vertebral artery is tortuous but patent without stenosis to the skull base. CTA HEAD Posterior circulation: Codominant distal vertebral arteries and vertebrobasilar junction are patent. Distal left V4 calcified plaque with only mild stenosis. Bilateral AICA appear dominant and patent. Patent basilar artery with mild mid basilar irregularity. No significant basilar stenosis. Patent SCA and fetal type bilateral PCA origins. Bilateral PCA branches are tortuous with mild irregularity. No significant PCA stenosis, mild bilateral P2 segment irregularity and stenosis (series 22, image 27 on the right). Anterior circulation: Both ICA siphons are patent. Left siphon mild ectasia and calcified plaque without stenosis. Normal left posterior communicating artery origin. Similar right siphon ectasia and mild calcified plaque without stenosis. Normal right posterior communicating artery origin. Patent carotid termini. Normal MCA and ACA origins. Diminutive anterior communicating artery. Bilateral ACA branches are within normal limits. Right MCA M1 segment is tortuous, right MCA trifurcation is patent without stenosis. Right MCA branches are within normal limits. Left MCA M1 segment bifurcates early without stenosis. There is a posterior left MCA branch occlusion, appears to be M3 or M4 on series 22, image 37. Other left MCA branches are within normal limits. Venous sinuses: Patent. Anatomic variants: Fetal type PCA origins. Bovine arch configuration. Review of the MIP images confirms the above findings IMPRESSION: 1. Negative for large vessel occlusion. 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 2. 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 No hemorrhagic transformation or intracranial mass effect. 3. Generalized arterial tortuosity in the head and neck with mild for age atherosclerosis. No other significant stenosis. 4.  Aortic  Atherosclerosis (ICD10-I70.0). Electronically Signed   By: VEAR Hurst M.D.   On: 07/23/2024 09:30   CT HEAD WO CONTRAST Result Date: 07/22/2024 CLINICAL DATA:  Memory loss and altered mental status. EXAM: CT HEAD WITHOUT CONTRAST TECHNIQUE: Contiguous axial images were obtained from the base of the skull through the vertex without intravenous contrast. RADIATION DOSE REDUCTION: This exam was performed according to the departmental dose-optimization program which includes automated exposure control, adjustment of the mA and/or kV according to patient size and/or use of iterative reconstruction technique. COMPARISON:  Head CT 04/28/2024. FINDINGS: Brain: There is an acute/early subacute left occipitotemporal nonhemorrhagic infarct involving the posterior temporal lobe from the posterior sylvian cortex, extending contiguously posteriorly to the lateral occipital lobe over an area measuring 7.1 cm AP, 3.0 cm coronal, 3.3 cm in height. There is hyperdensity of the infarcting tissue, loss of sulci and loss of gray-white matter differentiation. The infarct extends from the cortical surface to the ventricular interface, without hemorrhage or downward mass effect or notable effacement of the posterior horn of the left ventricle. There is no further evidence of infarcts. There is mild cerebral atrophy and small-vessel disease and mild-to-moderate atrophic ventriculomegaly which was seen previously. Cerebellum and brainstem are unremarkable. No midline shift. Basal cisterns are clear. Vascular: Despite the findings I do not see a hyperdense vessel. There are calcific plaques both siphons, distal left vertebral artery. Skull: Negative for fractures. There is patchy sclerosis and thickening of the  squamous portion of the left temporal bone. In retrospect this was present on the prior CT and it seems to have been present on MRI from 12/28/2021. This could represent pagetoid changes of the bone or changes due to fibrous dysplasia,  but a bone metastasis is not strictly excluded. Rest of the calvarium is unremarkable. Sinuses/Orbits: No acute finding. Old lens replacements. Clear sinuses and mastoids. Other: None. IMPRESSION: 1. 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 There sulcal effacement but no significant mass effect. 2. Atrophy and small-vessel disease.  Otherwise stable exam. 3. Patchy sclerosis and thickening of the squamous portion of the left temporal bone. No change since the 04/28/2024 CT, and appears to have been present on a 12/28/2021 MRI brain although not as well depicted. This could represent pagetoid changes of the bone or changes due to fibrous dysplasia, but a bone metastasis is not strictly excluded. 4. PRA is attempting to reach the ordering physician for stat notification at the time of signing. Electronically Signed   By: Francis Quam M.D.   On: 07/22/2024 23:32   US  Venous Img Lower Unilateral Right (DVT) Result Date: 07/11/2024 CLINICAL DATA:  Right lower extremity redness, pain and swelling. EXAM: Right LOWER EXTREMITY VENOUS DOPPLER ULTRASOUND TECHNIQUE: Gray-scale sonography with graded compression, as well as color Doppler and duplex ultrasound were performed to evaluate the lower extremity deep venous systems from the level of the common femoral vein and including the common femoral, femoral, profunda femoral, popliteal and calf veins including the posterior tibial, peroneal and gastrocnemius veins when visible. The superficial great saphenous vein was also interrogated. Spectral Doppler was utilized to evaluate flow at rest and with distal augmentation maneuvers in the common femoral, femoral and popliteal veins. COMPARISON:  None Available. FINDINGS: Contralateral Common Femoral Vein: Respiratory phasicity is normal and symmetric with the symptomatic side. No evidence of thrombus. Normal compressibility. Common Femoral Vein: No evidence of thrombus. Normal compressibility,  respiratory phasicity and response to augmentation. Saphenofemoral Junction: No evidence of thrombus. Normal compressibility and flow on color Doppler imaging. Profunda Femoral Vein: No evidence of thrombus. Normal compressibility and flow on color Doppler imaging. Femoral Vein: No evidence of thrombus. Normal compressibility, respiratory phasicity and response to augmentation. Popliteal Vein: There is occlusive thrombus in the right popliteal vein with noncompressibility of the vessel. Calf Veins: There is occlusive thrombus in one of the paired peroneal veins. Superficial Great Saphenous Vein: No evidence of thrombus. Normal compressibility. Venous Reflux:  None. Other Findings: There is occlusive thrombus in the small saphenous vein. IMPRESSION: 1. Positive for DVT in the popliteal and peroneal veins. 2. Occlusive thrombus in the small saphenous vein. These results will be called to the ordering clinician or representative by the Radiologist Assistant, and communication documented in the PACS or [employer]. Electronically Signed   By: Vanetta Chou M.D.   On: 07/11/2024 16:53     Addendum I have seen the patient, examined her. I agree with the assessment and and plan and have edited the notes.   I reviewed her liver biopsy findings with patient and her daughter in detail.  It showed metastatic adenocarcinoma, immunostain suggests upper GI primary, including upper GI/gastric and pancreatobiliary origin.  Given the CT scan finding of distal gastric wall thickening, significant anemia iron iron deficiency, this is most consistent with metastatic gastric cancer.  Will request MMR, and NGS [facility] after her discharge, to see if she is a candidate for any targeted therapy.  I would not recommend cytotoxic chemotherapy due  to her age and stroke.   Due to her PE and DVT, which are likely related to her metastatic cancer, she definitely benefited from anticoagulation.  However she has had significant  anemia partially due to the anticoagulation.  She has a had IVC filter placed.  Agrees with prophylactic dose Lovenox , which her daughter is willing to continue at home.  Continue to monitor CBC and iron level closely, and consider IV iron if ferritin less than 100, and blood transfusion if hemoglobin less than 7.5.  I will repeat her iron level tomorrow morning.  Onita Mattock MD 08/08/2024

## 2024-08-08 NOTE — Plan of Care (Signed)
  Problem: Consults Goal: RH STROKE PATIENT EDUCATION Description: See Patient Education module for education specifics  Outcome: Progressing   Problem: RH SAFETY Goal: RH STG ADHERE TO SAFETY PRECAUTIONS W/ASSISTANCE/DEVICE Description: STG Adhere to Safety Precautions With cues Assistance/Device. Outcome: Progressing   Problem: RH KNOWLEDGE DEFICIT Goal: RH STG INCREASE KNOWLEDGE OF STROKE PROPHYLAXIS Description: Patient and family will be able to manage care/secondary risks at discharge using educational resources for medications and dietary modification independently Outcome: Progressing   Problem: RH KNOWLEDGE DEFICIT Goal: RH STG INCREASE KNOWLEDGE OF HYPERTENSION Description: Patient and family will be able to manage HTN at discharge using educational resources for medications and dietary modification independently Outcome: Progressing Goal: RH STG INCREASE KNOWLEGDE OF HYPERLIPIDEMIA Description: Patient and family will be able to manage HLD at discharge using educational resources for medications and dietary modification independently Outcome: Progressing

## 2024-08-08 NOTE — Progress Notes (Addendum)
 PROGRESS NOTE   Subjective/Complaints: No acute complaints.  Laying in bed.  Asking when she can go home.. Ate 30% 1 meal yesterday Per pathology lab slide read should be back today WBC downtrending, Bcx x2 days without growth, final pending   Unable to obtain ROS due to patient cognitive status.     Objective:   No results found.   Recent Labs    08/07/24 0615 08/08/24 0512  WBC 17.3* 14.7*  HGB 7.7* 7.6*  HCT 25.0* 24.4*  PLT 433* 422*   Recent Labs    08/07/24 0615  NA 141  K 3.5  CL 110  CO2 21*  GLUCOSE 95  BUN 22  CREATININE 0.85  CALCIUM 8.4*     Intake/Output Summary (Last 24 hours) at 08/08/2024 0907 Last data filed at 08/08/2024 0806 Gross per 24 hour  Intake 200 ml  Output --  Net 200 ml          Physical Exam: Vital Signs Blood pressure 126/77, pulse 96, temperature 98.5 F (36.9 C), temperature source Oral, resp. rate 16, height 5' 2 (1.575 m), weight 55.4 kg, SpO2 98%.  General: awake, alert, appropriate, laying in bed. HENT: conjugate gaze; moist oropharynx. CV: regular rate and rhythm; no JVD Pulmonary: CTA B/L; no W/R/R- good air movement  GI: soft, NT, ND, (+)BS- hypoactive.  Generalized, diffuse mild tenderness. Psychiatric: Dysphoric, but more interactive than prior.  Follows commands appropriately.  Skin: C/D/I. No apparent lesions. MSK:      No apparent deformity.  Cognition: Awake, alert, oriented to self and hospital.  Year with cueing. + Expressive aphasia significantly improved, still some difficulty word finding Insight: Poor insight into current condition.  Sensation: Equal and intact in BL UE and Les.  Reflexes:  Negative Hoffman's and babinski signs bilaterally.  CN: Mild R facial droop--unchanged Coordination: No apparent tremors. No ataxia   Spasticity: MAS 0 in all extremities.  Strength: Moving all 4 extremities antigravity against resistance; 4/5      Physical exam unchanged from the above on reexamination 08/08/24     Assessment/Plan: 1. Functional deficits which require 3+ hours per day of interdisciplinary therapy in a comprehensive inpatient rehab setting. Physiatrist is providing close team supervision and 24 hour management of active medical problems listed below. Physiatrist and rehab team continue to assess barriers to discharge/monitor patient progress toward functional and medical goals  Care Tool:  Bathing    Body parts bathed by patient: Right arm, Left arm, Chest, Abdomen, Front perineal area, Buttocks, Right upper leg, Left upper leg, Right lower leg, Left lower leg, Face         Bathing assist Assist Level: Contact Guard/Touching assist     Upper Body Dressing/Undressing Upper body dressing   What is the patient wearing?: Pull over shirt    Upper body assist Assist Level: Set up assist    Lower Body Dressing/Undressing Lower body dressing      What is the patient wearing?: Underwear/pull up, Pants     Lower body assist Assist for lower body dressing: Contact Guard/Touching assist     Toileting Toileting    Toileting assist Assist for toileting: Contact Guard/Touching assist  Transfers Chair/bed transfer  Transfers assist     Chair/bed transfer assist level: Contact Guard/Touching assist     Locomotion Ambulation   Ambulation assist      Assist level: Minimal Assistance - Patient > 75% Assistive device: No Device Max distance: 180'   Walk 10 feet activity   Assist     Assist level: Minimal Assistance - Patient > 75% Assistive device: No Device   Walk 50 feet activity   Assist    Assist level: Minimal Assistance - Patient > 75% Assistive device: No Device    Walk 150 feet activity   Assist    Assist level: Minimal Assistance - Patient > 75% Assistive device: No Device    Walk 10 feet on uneven surface  activity   Assist     Assist level: Minimal  Assistance - Patient > 75% Assistive device: Other (comment) (No device, but holding to RHR)   Wheelchair     Assist Is the patient using a wheelchair?: Yes Type of Wheelchair: Manual    Wheelchair assist level: Dependent - Patient 0%      Wheelchair 50 feet with 2 turns activity    Assist        Assist Level: Dependent - Patient 0%   Wheelchair 150 feet activity     Assist      Assist Level: Dependent - Patient 0%   Blood pressure 126/77, pulse 96, temperature 98.5 F (36.9 C), temperature source Oral, resp. rate 16, height 5' 2 (1.575 m), weight 55.4 kg, SpO2 98%.  Medical Problem List and Plan: 1. 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 Onc, GI consulted. Called patient's daughter and left VM to discuss; did discuss workup with patient this AM as well. Close monitorring for neurologic changes given heparin ggt and risk for hemorrhagic conversion; planning to place IVC filter and DC AC   -  10/7: family training this afternoon. Poor, variable participation. Did much better with family yesterday CGA mobility and transfers. OT downgrading to Min A. Plan for 24/7 with daughter and son in law. Max-total A for communication.   -- Changing therapy therapies to daily for poor tolerance.   -- Oncology to initiate goals of care discussion with family today after review only today after reviewing pathology.  Discussed this with patient's daughter.  2.  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   - 9-30: Given recent embolic stroke, continue Eliquis  for now.  May need to reduce dose to 2.5 mg if hemoglobin continues to downtrend.  Will get CTA to evaluate for PE given ongoing tachycardia, leukocytosis, and low-grade fevers with generalized chest/abdominal pain and history of DVT 9-9 and 9-23--would need this  evaluated prior to adjustments of anticoagulation  - 10-1: CTA overnight with PE right upper and lower lobe, started on heparin ggt without bolus overnight - planning for IVC placement with heparin hold 4 hours prior- -10-2: IR may do IVC filter placement today or tomorrow; pharmacy informed, will resume heparin drip if procedure not planned by 5 PM - 10-3:  OR for IVC placement today.  Discussed with oncology Dr. Onesimo and patient's daughter, still some role for prophylactic dose Lovenox  for clot stabilization and prevention of extension of PE/DVTs.  Will place for tomorrow a.m., monitor CBC daily, DC Lovenox  if repeated transfusions required. -- Tolerating current regimen well  3. Pain Management: tylenol  prn.    - 10/1: Tx as above; can now schedule tylenol  1000 mg TID --improved  10/6: Continues to complain of generalized, diffuse pain.  No complaints of pain over the weekend.  Continue current regimen.  10-7: No complaints of pain  4. Mood/Behavior/Sleep: LCSW to follow for evaluation and support.             --melatonin prn insomnia.              -antipsychotic agents: N\/A   - 9-30: Withdrawn, poor attention.  Start fluoxetine 20 mg daily for motor recovery and behavior.  Start sleep log.   - sleep log appropriate  10-6: Starting to see some improving interaction and improving aphasia.  Continue current regimen.  5. Neuropsych/cognition: This  patient is not capable of making decisions on her own behalf.   6. Skin/Wound Care: Routine pressure relief measures.  7. Fluids/Electrolytes/Nutrition: Monitor I/O. Check CMET in am.    -9-30: A.m. labs stable  - 10-2: Per daughter, eating meals brought in from home, monitor weights weekly  10-6: P.o. intake sporadic; ate 100% of breakfast, 0% lunch and dinner.  Will plan to bring up appetite stimulant again with family  Filed Weights   07/31/24 1530  Weight: 55.4 kg     8.   Acute on chronic anemia: Monitor for signs of bleeding. H/H stable 6.1-->8.7.  FOBT -9-23. --add iron supplement as Iron level <10.  -  9-30: Hemoglobin downtrending, 7.3.  No obvious source of bleeding.  Added FOBT today.  Repeat H&H in AM; may need to transfuse if any lower.  Continue Eliquis  at current dose but if signs of bleeding may need to discuss adjusting to 2.5 mg daily. 10-1: Hemoglobin 7.1, on heparin drip. Monitor daily. Transfusion 1 U PRBC ordered; repeat h/h this evening. Holley Schmitz discussed with patient's daughter over the phone.  10/2: Hgb responses 8.0; monitor - ?transition to oral ASA +/- IVC filter to assist in reducing bleed, will d/w GI and Onc today 10-3: Hemoglobin stable 8.4; was off of heparin drip most of yesterday.  After IVC placement today + ppx lovenox  as above. Monitor daily. NO ASA.  10-7: Iron studies in a.m. -Daily CBC, transfuse for hemoglobin less than 7.  9.  Resting tachycardia: HR ranging in 110-120 at rest. CTA to rule out PE?             -pt is not in distress, breathing comfortably, no chest pain, on eliquis              --Afebrile X 24 hours.   --Monitor for symptoms with increase in activity.  9-30: Remained in the 90s overnight and throughout therapies this a.m.; coming up in the 110s this afternoon--stable 10/1-3 -- Stable  10. Leukocytosis: WBC on upward trend--16.0-->14.5-->16.3              -  however, pt is afebrile, urine/chest clear, on full a/c --Sepsis work up  prn temp elevation 9-30: WBCs downtrending 14.6--have been stable at approximately this level since 9-24.  No fevers, chills, obvious lines of infection.  Is complaining of some chest pain and temperature 100 this afternoon; CTA ordered as above -10/1: Right upper lower lobe PE, along with multiple intra-abdominal masses concerning for metastatic cancer; most likely source. Tx PE as above and monitor.  - 10/2 stable 14-16 10-3: Increased to 18.  Patient with expressive aphasia, difficulty performing review of systems, complicated by cancer, multiple blood clots, and steroids.  Will get urinalysis. 10/4- Ordered another U/A and Cx and Blood Cx's after d/w ID- T was 100.9- so started Rocephin 2 grams q 24 hours per ID request and will follw Cx's and clinically. Also gave tylenol  and on yellow MEWS.  10/5- MEWS back to green- no fever- blood Cx's no growth so far- CXR (-) and U/A (-)-  if Cx's are negative, can stop.   10/6 Cx read pending; CXR negative. Continue rocephin. WBC down slightly  10-7: Blood cultures negative x 2 days.  DC Rocephin.  11.  Elevated ALP, other LFTs are normal.  Trend.  12.  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  CT A/p 9/30 with: IMPRESSION: 1. Multiple hepatic masses consistent with metastases. The most likely primary is gastric given the circumferentially thickened mid to distal stomach, and hypogastric and gastrohepatic ligament adenopathy. 2. 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 3. Small amount of pelvic ascites.  - Called patient's daughter as above, left voicemail to review results. - Oncology consult placed for today; GI consult also placed for biopsy.   - 10/2: CEA, CA-19 pending. Daughter declined EGD for biopsy this AM. Per Oncology, may be able to perform liver biopsy for pathology; will follow up with Dr Lanny.   - 10/3: Liver biopsy performed today; pathology pending.  Appreciate Dr. Maryla discussion regarding likely upper  GI cancer, ECOG 2 required for even palliative chemotherapy in this setting.  Plan to continue rehab and reassess functional gains closer to discharge.  10/6: Biopsy results pending, CEA high, CA 19.9 within normal limits.    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  Dr. Dr. Lanny to discuss results with  patient and her daughter today   46.  Constipation.  Last bowel movement recorded 9-12.  Has been eating very little.  Will get KUB today.  10/3- Per pt's daughter, LBM was Wed/Th- and was soft- doesn't want me to give additional bowel meds  10/5- Still no BM- ate a t little more today- daughter doesn't want her ot get more bowel meds  LBM 10/1-2 per daughter--not eating much  13.  Chronic steroid use.  On prednisone 5 mg daily since admission.  On chart review, uncertain reasoning, will need to clarify    LOS: 8 days A FACE TO FACE EVALUATION WAS PERFORMED  Joesph JAYSON Likes 08/08/2024, 9:07 AM

## 2024-08-08 NOTE — Progress Notes (Signed)
 Occupational Therapy Session Note  Patient Details  Name: Tiffany [name] MRN: [id number] Date of Birth: [birth year]  Today's Date: 08/08/2024 OT Individual Time: 1115-1200 OT Individual Time Calculation (min): 45 min   Today's Date: 08/08/2024 OT Individual Time: 1420-1450 OT Individual Time Calculation (min): 30 min  and Today's Date: 08/08/2024 OT Missed Time: 10 Minutes Missed Time Reason: Other (comment) (Oncology rounding)  Short Term Goals: Week 1:  OT Short Term Goal 1 (Week 1): STGs=LTGs due to patient's estimated length of stay.  Skilled Therapeutic Interventions/Progress Updates:   Session 1: Pt greeted resting in bed, increased aurosal noted in comparison to previous sessions with this OT. Pt comes to EOB with CGA, ambulatory walk-in shower transfer with Min A + no AD going into bathroom, closer to Mod A to exit bathroom anticipate due to fatigue, although patient demo's strong forward flexion and verbalizes presence of pain. Rest provided as needed. Bathing completed at supervision level, occasional CGA due to decreased environmental awareness. Lateral leans for pericare. UB dressing with setup and LB dressing requiring Total A due to increased fatigue/lethargy. Pt remained sitting in WC at nurses station for improved stimulation, posey belt activated.   Session 2: Pt greeted sitting in [facility], daughter present for caregiver education. Caregiver education with focus on OT role, OT POC, and current patient functioning. Functional transfers and ADL routine. Pt daughter competes hands-on training for walk-in shower transfer. Handout provided for shower chair and anti-slip strips for shower, and reviewed cognitive implications in terms of motor planning. All questions answered at end of session. Of note, oncology rounding during session, patient missing ~10 mins of skilled intervention. Pt remained resting in bed with all immediate needs met.   Therapy Documentation Precautions:   Precautions Precautions: Fall Recall of Precautions/Restrictions: Impaired Precaution/Restrictions Comments: Aphasia Restrictions Weight Bearing Restrictions Per Provider Order: No   Therapy/Group: Individual Therapy  Nereida Habermann, OTR/L, MSOT  08/08/2024, 10:55 AM

## 2024-08-09 ENCOUNTER — Other Ambulatory Visit (HOSPITAL_COMMUNITY): Payer: Self-pay

## 2024-08-09 LAB — CBC
HCT: 22.4 % — ABNORMAL LOW (ref 36.0–46.0)
Hemoglobin: 7 g/dL — ABNORMAL LOW (ref 12.0–15.0)
MCH: 25.5 pg — ABNORMAL LOW (ref 26.0–34.0)
MCHC: 31.3 g/dL (ref 30.0–36.0)
MCV: 81.5 fL (ref 80.0–100.0)
Platelets: 424 K/uL — ABNORMAL HIGH (ref 150–400)
RBC: 2.75 MIL/uL — ABNORMAL LOW (ref 3.87–5.11)
RDW: 19.9 % — ABNORMAL HIGH (ref 11.5–15.5)
WBC: 12.7 K/uL — ABNORMAL HIGH (ref 4.0–10.5)
nRBC: 0 % (ref 0.0–0.2)

## 2024-08-09 LAB — IRON AND TIBC
Iron: 12 ug/dL — ABNORMAL LOW (ref 28–170)
Saturation Ratios: 5 % — ABNORMAL LOW (ref 10.4–31.8)
TIBC: 224 ug/dL — ABNORMAL LOW (ref 250–450)
UIBC: 212 ug/dL

## 2024-08-09 LAB — HEMOGLOBIN AND HEMATOCRIT, BLOOD
HCT: 31.6 % — ABNORMAL LOW (ref 36.0–46.0)
Hemoglobin: 10.2 g/dL — ABNORMAL LOW (ref 12.0–15.0)

## 2024-08-09 LAB — FERRITIN: Ferritin: 70 ng/mL (ref 11–307)

## 2024-08-09 LAB — PREPARE RBC (CROSSMATCH)

## 2024-08-09 MED ORDER — DOCUSATE SODIUM 100 MG PO CAPS
100.0000 mg | ORAL_CAPSULE | Freq: Two times a day (BID) | ORAL | Status: DC
  Administered 2024-08-09: 100 mg via ORAL
  Filled 2024-08-09: qty 1

## 2024-08-09 MED ORDER — SODIUM CHLORIDE 0.9% IV SOLUTION: Freq: Once | INTRAVENOUS | Status: AC

## 2024-08-09 MED ORDER — POLYETHYLENE GLYCOL 3350 17 G PO PACK
17.0000 g | PACK | Freq: Every day | ORAL | Status: DC
  Filled 2024-08-09: qty 1

## 2024-08-09 NOTE — Progress Notes (Signed)
 Occupational Therapy Session Note  Patient Details  Name: Tiffany [name] MRN: [id number] Date of Birth: 1941-04-20  Today's Date: 08/09/2024 OT Missed Time: 30 Minutes Missed Time Reason: Nursing care (Recieving blood)   Short Term Goals: Week 1:  OT Short Term Goal 1 (Week 1): STGs=LTGs due to patient's estimated length of stay.  Skilled Therapeutic Interventions/Progress Updates:   Pt received resting in bed, receiving unit of blood, therefore inappropriate for scheduled therapy session. Pt missing ~30 mins of skilled intervention due to the above, will attempt to make up as schedule permits and patient appropriate.   Therapy Documentation Precautions:  Precautions Precautions: Fall Recall of Precautions/Restrictions: Impaired Precaution/Restrictions Comments: Aphasia Restrictions Weight Bearing Restrictions Per Provider Order: No   Therapy/Group: Individual Therapy  Nereida Habermann, OTR/L, MSOT  08/09/2024, 7:57 AM

## 2024-08-09 NOTE — Progress Notes (Signed)
 Patient ID: Tiffany [name], female   DOB: 1941-01-30, 83 y.o.   MRN: [id number]  SW returned phone call to pt dtr who reported she has hired an Pensions consultant to help assist with obtaining guardianship for her mother. She asks about medical records to obtain. SW shared will ask CM to help with assistance for my chart, otherwise, if the physician is able to write a letter to support. SW shared will ask physician.   Graeme Jude, MSW, LCSW Office: [phone] Cell: [phone] Fax: [phone]

## 2024-08-09 NOTE — Progress Notes (Addendum)
 PROGRESS NOTE   Subjective/Complaints: No acute complaints.  No events overnight. Vitals remained stable; some subclinical temperatures 99.4 and 99.9 overnight. P.o. intakes remain poor but stable.  Continent of bladder. No bowel movement. Hemoglobin 7.0 this a.m.; will arrange for transfusion.  Iron panel significant for very low iron and TIBC, saturation ratios. WBC downtrending  Unable to obtain ROS due to patient cognitive status.     Objective:   No results found.   Recent Labs    08/08/24 0512 08/09/24 0457  WBC 14.7* 12.7*  HGB 7.6* 7.0*  HCT 24.4* 22.4*  PLT 422* 424*   Recent Labs    08/07/24 0615  NA 141  K 3.5  CL 110  CO2 21*  GLUCOSE 95  BUN 22  CREATININE 0.85  CALCIUM 8.4*     Intake/Output Summary (Last 24 hours) at 08/09/2024 0834 Last data filed at 08/09/2024 9177 Gross per 24 hour  Intake 300 ml  Output --  Net 300 ml          Physical Exam: Vital Signs Blood pressure 115/67, pulse 96, temperature 99.9 F (37.7 C), temperature source Oral, resp. rate 16, height 5' 2 (1.575 m), weight 55.4 kg, SpO2 96%.  General: awake, alert, appropriate, laying in bed. HENT: conjugate gaze; moist oropharynx. CV: regular rate and rhythm; no JVD Pulmonary: CTA B/L; no W/R/R- good air movement  GI: soft, NT, ND, (+)BS- hypoactive.  Generalized, diffuse mild tenderness. Psychiatric: Dysphoric, but more interactive than prior.  Follows commands appropriately.  Skin: C/D/I. No apparent lesions. MSK:      No apparent deformity.  Cognition: Awake, alert, oriented to self and hospital.  Year with cueing. + Expressive aphasia significantly improved, still some difficulty word finding Insight: Poor insight into current condition.  Sensation: Equal and intact in BL UE and Les.  Reflexes:  Negative Hoffman's and babinski signs bilaterally.  CN: Mild R facial droop--unchanged Coordination: No  apparent tremors. No ataxia   Spasticity: MAS 0 in all extremities.  Strength: Moving all 4 extremities antigravity against resistance; 4/5     Physical exam unchanged from the above on reexamination 08/09/24     Assessment/Plan: 1. Functional deficits which require 3+ hours per day of interdisciplinary therapy in a comprehensive inpatient rehab setting. Physiatrist is providing close team supervision and 24 hour management of active medical problems listed below. Physiatrist and rehab team continue to assess barriers to discharge/monitor patient progress toward functional and medical goals  Care Tool:  Bathing    Body parts bathed by patient: Right arm, Left arm, Chest, Abdomen, Front perineal area, Buttocks, Right upper leg, Left upper leg, Right lower leg, Left lower leg, Face         Bathing assist Assist Level: Contact Guard/Touching assist     Upper Body Dressing/Undressing Upper body dressing   What is the patient wearing?: Pull over shirt    Upper body assist Assist Level: Set up assist    Lower Body Dressing/Undressing Lower body dressing      What is the patient wearing?: Underwear/pull up, Pants     Lower body assist Assist for lower body dressing: Total Assistance - Patient < 25%  Toileting Toileting    Toileting assist Assist for toileting: Contact Guard/Touching assist     Transfers Chair/bed transfer  Transfers assist     Chair/bed transfer assist level: Contact Guard/Touching assist     Locomotion Ambulation   Ambulation assist      Assist level: Minimal Assistance - Patient > 75% Assistive device: No Device Max distance: 180'   Walk 10 feet activity   Assist     Assist level: Minimal Assistance - Patient > 75% Assistive device: No Device   Walk 50 feet activity   Assist    Assist level: Minimal Assistance - Patient > 75% Assistive device: No Device    Walk 150 feet activity   Assist    Assist level: Minimal  Assistance - Patient > 75% Assistive device: No Device    Walk 10 feet on uneven surface  activity   Assist     Assist level: Minimal Assistance - Patient > 75% Assistive device: Other (comment) (No device, but holding to RHR)   Wheelchair     Assist Is the patient using a wheelchair?: Yes Type of Wheelchair: Manual    Wheelchair assist level: Dependent - Patient 0%      Wheelchair 50 feet with 2 turns activity    Assist        Assist Level: Dependent - Patient 0%   Wheelchair 150 feet activity     Assist      Assist Level: Dependent - Patient 0%   Blood pressure 115/67, pulse 96, temperature 99.9 F (37.7 C), temperature source Oral, resp. rate 16, height 5' 2 (1.575 m), weight 55.4 kg, SpO2 96%.  Medical Problem List and Plan: 1. 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 Onc, GI consulted. Called patient's daughter and left VM to discuss; did discuss workup with patient this AM as well. Close monitorring for neurologic changes given heparin ggt and risk for hemorrhagic conversion; planning to place IVC filter and DC AC   -  10/7: family training this afternoon. Poor, variable participation. Did much better with family yesterday CGA mobility and transfers. OT downgrading to Min A. Plan for 24/7 with daughter and son in law. Max-total A for communication.   -- Changing therapy therapies to daily for poor tolerance.   -- Oncology to initiate goals of care discussion with family today after review only today after reviewing pathology.  Discussed this with patient's daughter--planning to pursue treatment, will follow-up 2 weeks as  outpatient  2.  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   - 9-30: Given recent embolic stroke, continue Eliquis  for now.  May need to reduce dose to 2.5 mg if hemoglobin continues to downtrend.  Will  get CTA to evaluate for PE given ongoing tachycardia, leukocytosis, and low-grade fevers with generalized chest/abdominal pain and history of DVT 9-9 and 9-23--would need this evaluated prior to adjustments of anticoagulation  - 10-1: CTA overnight with PE right upper and lower lobe, started on heparin ggt without bolus overnight - planning for IVC placement with heparin hold 4 hours prior- -10-2: IR may do IVC filter placement today or tomorrow; pharmacy informed, will resume heparin drip if procedure not planned by 5 PM - 10-3:  OR for IVC placement today.  Discussed with oncology Dr. Onesimo and patient's daughter, still some role for prophylactic dose Lovenox  for clot stabilization and prevention of extension of PE/DVTs.  Will place for tomorrow a.m., monitor CBC daily, DC Lovenox  if repeated transfusions required. -- Tolerating current regimen well  3. Pain Management: tylenol  prn.    - 10/1: Tx as above; can now schedule tylenol  1000 mg TID --improved  10/6: Continues to complain of generalized, diffuse pain.  No complaints of pain over the weekend.  Continue current regimen.  10-7: No complaints of pain  4. Mood/Behavior/Sleep: LCSW to follow for evaluation and support.             --melatonin prn insomnia.              -antipsychotic agents: N\/A   - 9-30: Withdrawn, poor attention.  Start fluoxetine 20 mg daily for motor  recovery and behavior.  Start sleep log.   - sleep log appropriate  10-6: Starting to see some improving interaction and improving aphasia.  Continue current regimen.  5. Neuropsych/cognition: This patient is not capable of making decisions on her own behalf.   6. Skin/Wound Care: Routine pressure relief measures.  7. Fluids/Electrolytes/Nutrition: Monitor I/O. Check CMET in am.    -9-30: A.m. labs stable  - 10-2: Per daughter, eating meals brought in from home, monitor weights weekly  10-6: P.o. intake sporadic; ate 100% of breakfast, 0% lunch and dinner.  Will plan to bring up appetite stimulant again with family  Filed Weights   07/31/24 1530  Weight: 55.4 kg     8.   Acute on chronic anemia: Monitor for signs of bleeding. H/H stable 6.1-->8.7.  FOBT -9-23. --add iron supplement as Iron level <10.  -  9-30: Hemoglobin downtrending, 7.3.  No obvious source of bleeding.  Added FOBT today.  Repeat H&H in AM; may need to transfuse if any lower.  Continue Eliquis  at current dose but if signs of bleeding may need to discuss adjusting to 2.5 mg daily. 10-1: Hemoglobin 7.1, on heparin drip. Monitor daily. Transfusion 1 U PRBC ordered; repeat h/h this evening. Holley Schmitz discussed with patient's daughter over the phone.  10/2: Hgb responses 8.0; monitor - ?transition to oral ASA +/- IVC filter to assist in reducing bleed, will d/w GI and Onc today 10-3: Hemoglobin stable 8.4; was off of heparin drip most of yesterday.  After IVC placement today + ppx lovenox  as above. Monitor daily. NO ASA.  10-8: Iron studies significant for iron deficiency anemia.  Hemoglobin 7.0 today; will transfuse 2 units given ongoing slow bleed likely secondary to cancer  9.  Resting tachycardia: HR ranging in 110-120 at rest. CTA to rule out PE?             -pt is not in distress, breathing comfortably, no chest pain, on eliquis              --Afebrile X 24 hours.   --Monitor  for symptoms with increase in activity.   9-30: Remained in the 90s overnight and throughout therapies this a.m.; coming up in the 110s this afternoon--stable 10/1-3 -- Stable  10. Leukocytosis: WBC on upward trend--16.0-->14.5-->16.3              -however, pt is afebrile, urine/chest clear, on full a/c --Sepsis work up prn temp elevation 9-30: WBCs downtrending 14.6--have been stable at approximately this level since 9-24.  No fevers, chills, obvious lines of infection.  Is complaining of some chest pain and temperature 100 this afternoon; CTA ordered as above -10/1: Right upper lower lobe PE, along with multiple intra-abdominal masses concerning for metastatic cancer; most likely source. Tx PE as above and monitor.  - 10/2 stable 14-16 10-3: Increased to 18.  Patient with expressive aphasia, difficulty performing review of systems, complicated by cancer, multiple blood clots, and steroids.  Will get urinalysis. 10/4- Ordered another U/A and Cx and Blood Cx's after d/w ID- T was 100.9- so started Rocephin 2 grams q 24 hours per ID request and will follw Cx's and clinically. Also gave tylenol  and on yellow MEWS.  10/5- MEWS back to green- no fever- blood Cx's no growth so far- CXR (-) and U/A (-)-  if Cx's are negative, can stop.   10/6 Cx read pending; CXR negative. Continue rocephin. WBC down slightly  10-7: Blood cultures negative x 2 days.  DC Rocephin.  10-8: Continues to improve, some subclinical fevers overnight.  Likely secondary to PE.  11.  Elevated ALP, other LFTs are normal.  Trend.  12.  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  CT A/p 9/30 with: IMPRESSION: 1. Multiple hepatic masses consistent with metastases. The most likely primary is gastric given the circumferentially thickened mid to distal stomach, and hypogastric and gastrohepatic ligament adenopathy. 2. 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 3. Small amount of pelvic ascites.  - Called patient's daughter as above, left voicemail  to review results. - Oncology consult placed for today; GI consult also placed for biopsy.   - 10/2: CEA, CA-19 pending. Daughter declined EGD for biopsy this AM. Per Oncology, may be able to perform liver biopsy for pathology; will follow up with Dr Lanny.   - 10/3: Liver biopsy performed today; pathology pending.  Appreciate Dr. 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  Plan to continue rehab and reassess functional gains closer to discharge.  10/6: Biopsy results pending, CEA high, CA 19.9 within normal limits.    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  Dr. Dr. Lanny to discuss results with  patient and her daughter today --will follow-up with them as outpatient in 2 weeks to discuss treatment options  12.  Constipation.  Last bowel movement recorded 9-12.  Has been eating very little.  Will get KUB today.  10/3- Per pt's daughter, LBM was Wed/Th- and was soft- doesn't want me to give additional bowel meds  10/5- Still no BM- ate a t little more today- daughter doesn't want her ot get more bowel meds  LBM 10/1-2 per daughter--not eating much  10-8: Add MiraLAX  daily and Colace 100 mg twice daily  13.  Chronic steroid use.  On prednisone 5 mg daily since admission.     LOS: 9 days A FACE TO FACE EVALUATION WAS PERFORMED  Joesph JAYSON Likes 08/09/2024, 8:34 AM

## 2024-08-09 NOTE — Plan of Care (Signed)
  Problem: Consults Goal: RH STROKE PATIENT EDUCATION Description: See Patient Education module for education specifics  Outcome: Progressing   Problem: RH SAFETY Goal: RH STG ADHERE TO SAFETY PRECAUTIONS W/ASSISTANCE/DEVICE Description: STG Adhere to Safety Precautions With cues Assistance/Device. Outcome: Progressing   Problem: RH KNOWLEDGE DEFICIT Goal: RH STG INCREASE KNOWLEDGE OF STROKE PROPHYLAXIS Description: Patient and family will be able to manage care/secondary risks at discharge using educational resources for medications and dietary modification independently Outcome: Progressing   Problem: RH KNOWLEDGE DEFICIT Goal: RH STG INCREASE KNOWLEDGE OF HYPERTENSION Description: Patient and family will be able to manage HTN at discharge using educational resources for medications and dietary modification independently Outcome: Progressing Goal: RH STG INCREASE KNOWLEGDE OF HYPERLIPIDEMIA Description: Patient and family will be able to manage HLD at discharge using educational resources for medications and dietary modification independently Outcome: Progressing

## 2024-08-09 NOTE — Progress Notes (Signed)
 Physical Therapy Note  Patient Details  Name: Tiffany [name] MRN: [id number] Date of Birth: [birth year] Today's Date: 08/09/2024  Today's Date: 08/09/2024 PT Missed Time: 30 Minutes Missed Time Reason: Other (Comment) (receiving blood transfusion)   Pt receiving transfusion at scheduled therapy time, unable to participate with therapy at this time with RN reporting pt requires vital monitoring.  PT will reattempt as schedule allows.  Reche Ohara PT, DPT 08/09/2024, 4:03 PM

## 2024-08-10 ENCOUNTER — Other Ambulatory Visit (HOSPITAL_COMMUNITY): Payer: Self-pay

## 2024-08-10 DIAGNOSIS — Z9189 Other specified personal risk factors, not elsewhere classified: Secondary | ICD-10-CM

## 2024-08-10 DIAGNOSIS — I2699 Other pulmonary embolism without acute cor pulmonale: Secondary | ICD-10-CM | POA: Insufficient documentation

## 2024-08-10 LAB — TYPE AND SCREEN
ABO/RH(D): O POS
Antibody Screen: NEGATIVE
Unit division: 0
Unit division: 0

## 2024-08-10 LAB — CBC
HCT: 32.9 % — ABNORMAL LOW (ref 36.0–46.0)
Hemoglobin: 10.4 g/dL — ABNORMAL LOW (ref 12.0–15.0)
MCH: 26.4 pg (ref 26.0–34.0)
MCHC: 31.6 g/dL (ref 30.0–36.0)
MCV: 83.5 fL (ref 80.0–100.0)
Platelets: 349 K/uL (ref 150–400)
RBC: 3.94 MIL/uL (ref 3.87–5.11)
RDW: 18.5 % — ABNORMAL HIGH (ref 11.5–15.5)
WBC: 13.3 K/uL — ABNORMAL HIGH (ref 4.0–10.5)
nRBC: 0 % (ref 0.0–0.2)

## 2024-08-10 LAB — BASIC METABOLIC PANEL WITH GFR
Anion gap: 14 (ref 5–15)
BUN: 20 mg/dL (ref 8–23)
CO2: 20 mmol/L — ABNORMAL LOW (ref 22–32)
Calcium: 8.7 mg/dL — ABNORMAL LOW (ref 8.9–10.3)
Chloride: 105 mmol/L (ref 98–111)
Creatinine, Ser: 0.7 mg/dL (ref 0.44–1.00)
GFR, Estimated: >60 mL/min (ref >60)
Glucose, Bld: 87 mg/dL (ref 70–99)
Potassium: 3.7 mmol/L (ref 3.5–5.1)
Sodium: 139 mmol/L (ref 135–145)

## 2024-08-10 LAB — BPAM RBC
Blood Product Expiration Date: 202511032359
Blood Product Expiration Date: 202511032359
ISSUE DATE / TIME: 202510081244
ISSUE DATE / TIME: 202510081616
Unit Type and Rh: 5100
Unit Type and Rh: 5100

## 2024-08-10 LAB — CULTURE, BLOOD (ROUTINE X 2)
Culture: NO GROWTH
Culture: NO GROWTH
Special Requests: ADEQUATE
Special Requests: ADEQUATE

## 2024-08-10 MED ORDER — PANTOPRAZOLE SODIUM 40 MG PO TBEC
40.0000 mg | DELAYED_RELEASE_TABLET | Freq: Two times a day (BID) | ORAL | 0 refills | Status: DC
  Filled 2024-08-10: qty 60, 30d supply, fill #0

## 2024-08-10 MED ORDER — ASCORBIC ACID 500 MG PO TABS
500.0000 mg | ORAL_TABLET | Freq: Every day | ORAL | 0 refills | Status: DC
  Filled 2024-08-10: qty 30, 30d supply, fill #0

## 2024-08-10 MED ORDER — METOPROLOL TARTRATE 25 MG PO TABS
25.0000 mg | ORAL_TABLET | Freq: Every day | ORAL | 0 refills | Status: DC
  Filled 2024-08-10: qty 30, 30d supply, fill #0

## 2024-08-10 MED ORDER — MIRTAZAPINE 15 MG PO TABS
15.0000 mg | ORAL_TABLET | Freq: Every day | ORAL | 0 refills | Status: DC
  Filled 2024-08-10: qty 30, 30d supply, fill #0

## 2024-08-10 MED ORDER — ATORVASTATIN CALCIUM 20 MG PO TABS
20.0000 mg | ORAL_TABLET | Freq: Every day | ORAL | 0 refills | Status: DC
  Filled 2024-08-10: qty 30, 30d supply, fill #0

## 2024-08-10 MED ORDER — ACETAMINOPHEN 500 MG PO TABS
1000.0000 mg | ORAL_TABLET | Freq: Three times a day (TID) | ORAL | 0 refills | Status: DC
  Filled 2024-08-10: qty 150, 25d supply, fill #0

## 2024-08-10 MED ORDER — FERROUS SULFATE 325 (65 FE) MG PO TABS
325.0000 mg | ORAL_TABLET | Freq: Every day | ORAL | 0 refills | Status: DC
  Filled 2024-08-10: qty 30, 30d supply, fill #0

## 2024-08-10 MED ORDER — ENOXAPARIN SODIUM 40 MG/0.4ML IJ SOSY
40.0000 mg | PREFILLED_SYRINGE | INTRAMUSCULAR | 0 refills | Status: DC
  Filled 2024-08-10: qty 12, 30d supply, fill #0

## 2024-08-10 MED ORDER — PREDNISONE 5 MG PO TABS
5.0000 mg | ORAL_TABLET | Freq: Every day | ORAL | 0 refills | Status: DC
  Filled 2024-08-10: qty 30, 30d supply, fill #0

## 2024-08-10 MED ORDER — VITAMIN C 500 MG PO TABS
500.0000 mg | ORAL_TABLET | Freq: Every day | ORAL | Status: DC
  Administered 2024-08-10 – 2024-08-11 (×2): 500 mg via ORAL
  Filled 2024-08-10 (×2): qty 1

## 2024-08-10 NOTE — Progress Notes (Addendum)
 Occupational Therapy Discharge Summary  Patient Details  Name: Maurisha Mongeau MRN: [id number] Date of Birth: 1941-03-30  Date of Discharge from OT service:August 10, 2024  Today's Date: 08/10/2024 OT Individual Time: 0835-0900 OT Individual Time Calculation (min): 25 min    Patient has met 5 of 5 long term goals, discharging at overall Supervision level, although daughter educated on need for CGA for tub/shower transfers. Anticipate patient will continue to required increased levels of assistance for functional mobility and ADL tasks secondary to progression of complex medical issues, and subsequent decreased strength and activity tolerance. Patient's care partner is independent to provide the necessary physical and cognitive assistance at discharge.    Reasons goals not met: N/A  Recommendation:  Patient will benefit from ongoing skilled OT services in home health setting to continue to advance functional skills in the area of BADL and Reduce care partner burden.  Equipment: BSC  Reasons for discharge: discharge from hospital  Patient/family agrees with progress made and goals achieved: Yes  OT Discharge Precautions/Restrictions  Precautions Precautions: Fall Recall of Precautions/Restrictions: Impaired Precaution/Restrictions Comments: Fluent aphasia Restrictions Weight Bearing Restrictions Per Provider Order: No ADL ADL Eating: Set up, Maximal cueing (Max cuing for intake encouragement.) Where Assessed-Eating: Edge of bed Grooming: Supervision/safety Where Assessed-Grooming: Sitting at sink Upper Body Bathing: Supervision/safety, Minimal cueing Where Assessed-Upper Body Bathing: Edge of bed Lower Body Bathing: Supervision/safety, Minimal cueing Where Assessed-Lower Body Bathing: Edge of bed Upper Body Dressing: Supervision/safety Where Assessed-Upper Body Dressing: Edge of bed Lower Body Dressing: Supervision/safety, Minimal cueing Where Assessed-Lower Body Dressing:  Edge of bed Toileting: Supervision/safety Where Assessed-Toileting: Toilet, Psychiatrist Transfer: Close supervision Toilet Transfer Method: Proofreader: Gaffer: Not assessed Film/video editor: Close supervision Film/video editor Method: Designer, industrial/product: Information systems manager with back, Grab bars (RW) Vision Baseline Vision/History: 0 No visual deficits Patient Visual Report: Other (comment) (Unable to formally assess due to communication deficits.) Perception  Inattention/Neglect Praxis Praxis: Impaired Praxis Impairment Details: Motor planning;Organization Cognition Cognition Overall Cognitive Status: Difficult to assess Arousal/Alertness: Awake/alert Awareness: Impaired Executive Function: Organizing;Decision Making;Self Monitoring;Self Correcting;Reasoning Safety/Judgment: Impaired Comments: Decreased insight/awareness of deficits. Questionable safety awareness. Brief Interview for Mental Status (BIMS) Repetition of Three Words (First Attempt): Nonsensical Temporal Orientation: Year: No answer Temporal Orientation: Month: No answer Temporal Orientation: Day: Nonsensical Recall: Sock: No answer Recall: Blue: No answer Recall: Bed: No answer BIMS Summary Score: 99 Sensation Sensation Light Touch: Appears Intact Hot/Cold: Appears Intact Proprioception: Not tested Stereognosis: Not tested Coordination Gross Motor Movements are Fluid and Coordinated: No Fine Motor Movements are Fluid and Coordinated: Yes Coordination and Movement Description: Deficits due to generalized debility and decreased activity tolerance as well as R inattention. Motor  Motor Motor: Abnormal postural alignment and control Motor - Discharge Observations: Decreased coordination and R-attention. Mobility  Transfers Sit to Stand: Supervision/Verbal cueing;Contact Guard/Touching assist Stand to Sit:  Supervision/Verbal cueing;Contact Guard/Touching assist  Trunk/Postural Assessment  Cervical Assessment Cervical Assessment: Exceptions to [facility] (forward head, tendency towards L cervical rotation due to R inattention) Thoracic Assessment Thoracic Assessment: Exceptions to [facility] (rounded shoulders) Lumbar Assessment Lumbar Assessment: Exceptions to [facility] (posterior pelvic tilt) Postural Control Postural Control: Deficits on evaluation Righting Reactions: Delayed and inadequate Protective Responses: decreased Postural Limitations: decreased  Balance Balance Balance Assessed: Yes Static Sitting Balance Static Sitting - Balance Support: Feet supported Static Sitting - Level of Assistance: 5: Stand by assistance (SUP) Dynamic Sitting Balance Dynamic Sitting - Balance Support: Feet supported;During functional activity  Dynamic Sitting - Level of Assistance: 5: Stand by assistance (SUP) Static Standing Balance Static Standing - Balance Support: During functional activity;Bilateral upper extremity supported Static Standing - Level of Assistance: 5: Stand by assistance (SUP-CGA) Dynamic Standing Balance Dynamic Standing - Balance Support: During functional activity;Bilateral upper extremity supported Dynamic Standing - Level of Assistance: 5: Stand by assistance (SUP-CGA) Extremity/Trunk Assessment RUE Assessment RUE Assessment: Within Functional Limits LUE Assessment LUE Assessment: Within Functional Limits  Skilled Intervention: Pt greeted resting in bed, RN administering medications during session. Increased encouragement required for participation in ADL tasks. Transition to EOB with supervision. Sponge-bathing/dressing with levels of assistance noted above. Pt remained resting in bed with all immediate needs met, call bell within reach, and door remained open.   Nereida Habermann, OTR/L, MSOT  08/10/2024, 9:04 AM

## 2024-08-10 NOTE — Progress Notes (Signed)
 Inpatient Rehabilitation Discharge Medication Review by a Pharmacist  A complete drug regimen review was completed for this patient to identify any potential clinically significant medication issues.  High Risk Drug Classes Is patient taking? Indication by Medication  Antipsychotic No   Anticoagulant Yes Lovenox  - DVT, stroke  Antibiotic No   Opioid No   Antiplatelet No   Hypoglycemics/insulin No   Vasoactive Medication Yes Metoprolol - HTN, sinus tachycardia  Chemotherapy No   Other Yes Ascorbic acid, Ferrous sulfate, Prebiotic, Probiotic - supplements Atorvastatin - cholesterol Dorzolamide, Bimatoprost - glaucoma Mirtazapine - appetite stimulant Prednisone - inflammatory condition  Pantoprazole- reflux  Acetaminophen - pain        Type of Medication Issue Identified Description of Issue Recommendation(s)  Drug Interaction(s) (clinically significant)     Duplicate Therapy     Allergy     No Medication Administration End Date     Incorrect Dose     Additional Drug Therapy Needed     Significant med changes from prior encounter (inform family/care partners about these prior to discharge). Eliquis , Alendronate, Vitamin D3 STOPPED  Atorvastatin, Pantoprazole, Ferrous Sulfate, Mirtazapine STARTED Communicate relevant medication changes to patient/family members at discharge from CIR.      Other       Clinically significant medication issues were identified that warrant physician communication and completion of prescribed/recommended actions by midnight of the next day:  No  Name of provider notified for urgent issues identified:   Provider Method of Notification:     Pharmacist comments:   Time spent performing this drug regimen review (minutes):  20   Rocky Slade, PharmD, BCPS 08/10/2024 12:01 PM

## 2024-08-10 NOTE — Progress Notes (Signed)
 Speech Language Pathology Daily Session Note  Patient Details  Name: Tiffany [name] MRN: [id number] Date of Birth: 12/21/1940  Today's Date: 08/10/2024 SLP Individual Time: 1415-1445 SLP Individual Time Calculation (min): 30 min  Short Term Goals: Week 1: SLP Short Term Goal 1 (Week 1): STGs = LTGs d/t ELOS  Skilled Therapeutic Interventions:   Pt greeted at bedside. She was awake upon SLP arrival and was overall participative in very functional tx tasks targeting language. During initial conversation re upcoming d/c, she answered Y/N questions w/ modA. She was also challenged w/ open ended questions, but was max-totalA to answer these. She was able to answer w/ single words x2 overall. SLP provided and demo'd unscrambling automatics task. After demo, she benefited from maxA to unscramble 1-10. She then verbalized 1-10 w/ maxA. SLP also challenged pt to copying task at word level. She benefited from minA to copy her first and last name. She did not initiate copying other single words. At the end of tx tasks, she was left in bed w/ the alarm set and call light within reach. Recommend cont ST per POC.   Pain  None reported - appeared comfortable  Therapy/Group: Individual Therapy  [name] 08/10/2024, 4:09 PM

## 2024-08-10 NOTE — Progress Notes (Signed)
 Patient ID: Tiffany [name], female   DOB: 04-14-1941, 83 y.o.   MRN: [id number]  SW received updates from medical team reporting pt is not participating in therapy, and discuss discharging to home today.   1003- SW left message for [name] requesting return phone call, and if the family would like HH. SW waiting on follow-up.   1135- Sw received return phone call from [name] to discuss pt discharging today due to limited gains made in rehab, and refusal of therapy. Reports she is unable to pickup her mother today as she will not have any help until tomorrow late afternoon. She prefers to defer [facility] therapy at this time. SW will order w/c and 3in1 BSC.   SW updated medical team.   SW ordered DME with [employer] via parachute.   Graeme Jude, MSW, LCSW Office: [phone] Cell: [phone] Fax: [phone]

## 2024-08-10 NOTE — Progress Notes (Signed)
 Physical Therapy Discharge Summary  Patient Details  Name: Tiffany [name] MRN: [id number] Date of Birth: 07-01-1941  Date of Discharge from PT service:August 10, 2024  Today's Date: 08/10/2024 PT Individual Time: 1132-1147 PT Individual Time Calculation (min): 15 min    Patient has met 7 of 7 long term goals but despite meeting goals pt has had minimal to no progress from evaluation secondary to low motivation and progression of medical condition. Patient to discharge at an ambulatory level Min Assist.   Patient's care partner is independent to provide the necessary physical and cognitive assistance at discharge.  Reasons goals not met: N/A  Recommendation:  Patient will benefit from ongoing skilled PT services in home health setting to continue to advance safe functional mobility, address ongoing impairments in gait mechanics, activity tolerance, dynamic standing balance, and minimize fall risk.  Equipment: 18X16 WC  Reasons for discharge: lack of progress toward goals and discharge from hospital  Patient/family agrees with progress made and goals achieved: Yes  PT Discharge Skilled Treatment Interventions: Pt presents in room in bed, not agreeable to OOB treatment but agreeable to DC assessment and repositioning in supine for comfort and for skin protection. Therapist provides education on pt repositioning in supine for skin protection as well as education on safety at DC. Pt repositioned towards HOB total assist x2 and positioned with bilateral heels floated. Pt able to follow commands with improved effectiveness for strength assessment, improved from eval. Pt overall continues to demonstrate low motivation and poor participation with therapies and OOB mobility, will require 24/7 assistance at DC.  Precautions/Restrictions Precautions Precautions: Fall Recall of Precautions/Restrictions: Impaired Precaution/Restrictions Comments: Fluent aphasia Restrictions Weight Bearing  Restrictions Per Provider Order: No Pain Interference Pain Interference Pain Effect on Sleep: 1. Rarely or not at all Pain Interference with Therapy Activities: 1. Rarely or not at all Pain Interference with Day-to-Day Activities: 1. Rarely or not at all Vision/Perception  Vision - History Ability to See in Adequate Light: 0 Adequate Perception Preception Impairment Details: Inattention/Neglect Praxis Praxis: Impaired Praxis Impairment Details: Motor planning;Organization  Cognition Overall Cognitive Status: Difficult to assess Arousal/Alertness: Awake/alert Orientation Level: Oriented to person Awareness: Impaired Executive Function: Organizing;Decision Making;Self Monitoring;Self Correcting;Reasoning Safety/Judgment: Impaired Comments: Decreased insight/awareness of deficits. Questionable safety awareness. Sensation Sensation Light Touch: Appears Intact Hot/Cold: Appears Intact Proprioception: Not tested Stereognosis: Not tested Coordination Gross Motor Movements are Fluid and Coordinated: No Fine Motor Movements are Fluid and Coordinated: Yes Coordination and Movement Description: Deficits due to generalized debility and decreased activity tolerance as well as R inattention Motor  Motor Motor: Motor apraxia;Ataxia Motor - Discharge Observations: Decreased coordination, possible apraxia however difficult to differentiate between apraxia and aphasia, and R inattention.  Mobility Transfers Transfers: Sit to Stand;Stand to Sit;Stand Pivot Transfers Sit to Stand: Minimal Assistance - Patient > 75% Stand to Sit: Minimal Assistance - Patient > 75% Stand Pivot Transfers: Contact Guard/Touching assist Transfer (Assistive device): Rolling walker Locomotion  Gait Ambulation: Yes Gait Assistance: Contact Guard/Touching assist Gait Distance (Feet): 100 Feet Assistive device: Rolling walker Gait Assistance Details: Verbal cues for sequencing;Verbal cues for technique;Verbal cues  for precautions/safety;Verbal cues for gait pattern Gait Gait: Yes Gait Pattern: Impaired Gait Pattern: Scissoring;Trunk flexed;Poor foot clearance - right Gait velocity: decreased Stairs / Additional Locomotion Stairs: Yes Stairs Assistance: Contact Guard/Touching assist Stair Management Technique: One rail Left Number of Stairs: 8 Height of Stairs: 6 Wheelchair Mobility Wheelchair Mobility: No  Trunk/Postural Assessment  Cervical Assessment Cervical Assessment: Exceptions to [facility] (forward head, tendency  towards L cervical rotation due to R inattention) Thoracic Assessment Thoracic Assessment: Exceptions to [facility] (rounded shoulders) Lumbar Assessment Lumbar Assessment: Exceptions to [facility] (posterior pelvic tilt) Postural Control Postural Control: Deficits on evaluation Righting Reactions: Delayed and inadequate Protective Responses: decreased Postural Limitations: decreased  Balance Balance Balance Assessed: Yes Static Sitting Balance Static Sitting - Balance Support: Feet supported Static Sitting - Level of Assistance: 5: Stand by assistance Dynamic Sitting Balance Dynamic Sitting - Balance Support: Feet supported;During functional activity Dynamic Sitting - Level of Assistance: 5: Stand by assistance Static Standing Balance Static Standing - Balance Support: During functional activity;Bilateral upper extremity supported Static Standing - Level of Assistance: Other (comment) (CGA) Dynamic Standing Balance Dynamic Standing - Balance Support: During functional activity;Bilateral upper extremity supported Dynamic Standing - Level of Assistance: Other (comment) (CGA) Extremity Assessment  RLE Assessment RLE Assessment: Exceptions to [facility] General Strength Comments: difficulty assessing formally due to aphasis and apraxia, grossly 3+/5 assessed functionally LLE Assessment LLE Assessment: Exceptions to [facility] General Strength Comments: difficulty assessing formally due to aphasis and  apraxia, grossly 3+/5 assessed functionally   Reche Ohara PT, DPT 08/10/2024, 12:05 PM

## 2024-08-10 NOTE — Plan of Care (Signed)
  Problem: Consults Goal: RH STROKE PATIENT EDUCATION Description: See Patient Education module for education specifics  Outcome: Progressing   Problem: RH SAFETY Goal: RH STG ADHERE TO SAFETY PRECAUTIONS W/ASSISTANCE/DEVICE Description: STG Adhere to Safety Precautions With cues Assistance/Device. Outcome: Progressing   Problem: RH KNOWLEDGE DEFICIT Goal: RH STG INCREASE KNOWLEDGE OF STROKE PROPHYLAXIS Description: Patient and family will be able to manage care/secondary risks at discharge using educational resources for medications and dietary modification independently Outcome: Progressing   Problem: RH KNOWLEDGE DEFICIT Goal: RH STG INCREASE KNOWLEDGE OF HYPERTENSION Description: Patient and family will be able to manage HTN at discharge using educational resources for medications and dietary modification independently Outcome: Progressing Goal: RH STG INCREASE KNOWLEGDE OF HYPERLIPIDEMIA Description: Patient and family will be able to manage HLD at discharge using educational resources for medications and dietary modification independently Outcome: Progressing

## 2024-08-10 NOTE — Plan of Care (Signed)
   Problem: RH Balance Goal: LTG Patient will maintain dynamic standing balance (PT) Description: LTG:  Patient will maintain dynamic standing balance with assistance during mobility activities (PT) Outcome: Completed/Met   Problem: Sit to Stand Goal: LTG:  Patient will perform sit to stand with assistance level (PT) Description: LTG:  Patient will perform sit to stand with assistance level (PT) Outcome: Completed/Met   Problem: RH Bed to Chair Transfers Goal: LTG Patient will perform bed/chair transfers w/assist (PT) Description: LTG: Patient will perform bed to chair transfers with assistance (PT). Outcome: Completed/Met   Problem: RH Car Transfers Goal: LTG Patient will perform car transfers with assist (PT) Description: LTG: Patient will perform car transfers with assistance (PT). Outcome: Completed/Met   Problem: RH Ambulation Goal: LTG Patient will ambulate in controlled environment (PT) Description: LTG: Patient will ambulate in a controlled environment, # of feet with assistance (PT). Outcome: Completed/Met Goal: LTG Patient will ambulate in home environment (PT) Description: LTG: Patient will ambulate in home environment, # of feet with assistance (PT). Outcome: Completed/Met   Problem: RH Stairs Goal: LTG Patient will ambulate up and down stairs w/assist (PT) Description: LTG: Patient will ambulate up and down # of stairs with assistance (PT) Outcome: Completed/Met

## 2024-08-10 NOTE — Progress Notes (Incomplete)
 Speech Language Pathology Discharge Summary  Patient Details  Name: Tiffany [name] MRN: [id number] Date of Birth: 1941-06-11  Date of Discharge from SLP service:August 10, 2024  Patient has met 2 of 4 long term goals.  Patient to discharge at overall Max level.  Reasons goals not met: pt participation negatively impacted sucess   Clinical Impression/Discharge Summary:  Minimal progress noted this stay d/t pt participation and progression of disease process. She demonstrated slightly improved functional auditory comprehension and spontaneous expression, however, continues to require maxA overall for communication given fluent aphasia. She tolerates regular diet/thin liquids independently. She is going home w/ 24/7 care available and would benefit from continued ST to target remaining aphasia, maximize communication, and reduce caregiver burden. Pt/family education complete.  Care Partner:  Caregiver Able to Provide Assistance: Yes  Type of Caregiver Assistance: Cognitive  Recommendation:  24 hour supervision/assistance;Home Health SLP  Rationale for SLP Follow Up: Maximize functional communication;Reduce caregiver burden   Equipment: none   Reasons for discharge: Treatment goals met   Patient/Family Agrees with Progress Made and Goals Achieved: Yes    [name] 08/10/2024, 4:32 PM

## 2024-08-10 NOTE — Progress Notes (Signed)
 PROGRESS NOTE   Subjective/Complaints: No acute complaints.  No events overnight.  Continues to want to go home. Responded well to 2 U PRBCs Patient seen in bed.  No questions, concerns.  Reached out to daughter regarding possible discharge today; will not be able to coordinate pick up and home care until tomorrow.  Unable to obtain ROS due to patient cognitive status.     Objective:   No results found.   Recent Labs    08/09/24 0457 08/09/24 2147 08/10/24 0539  WBC 12.7*  --  13.3*  HGB 7.0* 10.2* 10.4*  HCT 22.4* 31.6* 32.9*  PLT 424*  --  349   Recent Labs    08/10/24 0539  NA 139  K 3.7  CL 105  CO2 20*  GLUCOSE 87  BUN 20  CREATININE 0.70  CALCIUM 8.7*     Intake/Output Summary (Last 24 hours) at 08/10/2024 0934 Last data filed at 08/09/2024 1943 Gross per 24 hour  Intake 1270 ml  Output --  Net 1270 ml          Physical Exam: Vital Signs Blood pressure (!) 140/78, pulse 88, temperature 98.2 F (36.8 C), temperature source Oral, resp. rate 18, height 5' 2 (1.575 m), weight 55.4 kg, SpO2 97%.  General: awake, alert, appropriate, laying in bed. HENT: conjugate gaze; moist oropharynx. CV: regular rate and rhythm; no JVD Pulmonary: CTA B/L; no W/R/R- good air movement  GI: soft, NT, ND, (+)BS- hypoactive.  No further tenderness on palpation. Psychiatric: Dysphoric. Follows commands appropriately.  Skin: C/D/I. No apparent lesions. MSK:      No apparent deformity.  Cognition: Awake, alert, oriented to self and hospital with cues. + Expressive aphasia--waxes and wanes depending on lethargy; worse today Insight: Poor insight into current condition.  Sensation: Equal and intact in BL UE and Les.  Reflexes:  Negative Hoffman's and babinski signs bilaterally.  CN: Mild R facial droop--unchanged Coordination: No apparent tremors. No ataxia   Spasticity: MAS 0 in all extremities.  Strength:  Moving all 4 extremities antigravity against resistance; 4/5     Assessment/Plan: 1. Functional deficits which require 3+ hours per day of interdisciplinary therapy in a comprehensive inpatient rehab setting. Physiatrist is providing close team supervision and 24 hour management of active medical problems listed below. Physiatrist and rehab team continue to assess barriers to discharge/monitor patient progress toward functional and medical goals  Care Tool:  Bathing    Body parts bathed by patient: Right arm, Left arm, Chest, Abdomen, Front perineal area, Buttocks, Right upper leg, Left upper leg, Right lower leg, Left lower leg, Face         Bathing assist Assist Level: Contact Guard/Touching assist     Upper Body Dressing/Undressing Upper body dressing   What is the patient wearing?: Pull over shirt    Upper body assist Assist Level: Set up assist    Lower Body Dressing/Undressing Lower body dressing      What is the patient wearing?: Underwear/pull up, Pants     Lower body assist Assist for lower body dressing: Total Assistance - Patient < 25%     Toileting Toileting    Toileting assist  Assist for toileting: Contact Guard/Touching assist     Transfers Chair/bed transfer  Transfers assist     Chair/bed transfer assist level: Contact Guard/Touching assist     Locomotion Ambulation   Ambulation assist      Assist level: Minimal Assistance - Patient > 75% Assistive device: No Device Max distance: 180'   Walk 10 feet activity   Assist     Assist level: Minimal Assistance - Patient > 75% Assistive device: No Device   Walk 50 feet activity   Assist    Assist level: Minimal Assistance - Patient > 75% Assistive device: No Device    Walk 150 feet activity   Assist    Assist level: Minimal Assistance - Patient > 75% Assistive device: No Device    Walk 10 feet on uneven surface  activity   Assist     Assist level: Minimal  Assistance - Patient > 75% Assistive device: Other (comment) (No device, but holding to RHR)   Wheelchair     Assist Is the patient using a wheelchair?: Yes Type of Wheelchair: Manual    Wheelchair assist level: Dependent - Patient 0%      Wheelchair 50 feet with 2 turns activity    Assist        Assist Level: Dependent - Patient 0%   Wheelchair 150 feet activity     Assist      Assist Level: Dependent - Patient 0%   Blood pressure (!) 140/78, pulse 88, temperature 98.2 F (36.8 C), temperature source Oral, resp. rate 18, height 5' 2 (1.575 m), weight 55.4 kg, SpO2 97%.  Medical Problem List and Plan: 1. 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 Onc, GI consulted. Called patient's daughter and left VM to discuss; did discuss workup with patient this AM as well. Close monitorring for neurologic changes given heparin ggt and risk for hemorrhagic conversion; planning to place IVC filter and DC AC   -  10/7: family training this afternoon. Poor, variable participation. Did much better with family yesterday CGA mobility and transfers. OT downgrading to Min A. Plan for 24/7 with daughter and son in law. Max-total A for communication.   -- Changing therapy therapies to daily for poor tolerance.   -- Oncology to initiate goals of care discussion with family today after review only today after reviewing pathology.  Discussed this with patient's daughter--planning to pursue treatment, will follow-up 2 weeks as outpatient  2.  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   - 9-30: Given recent embolic stroke, continue Eliquis  for now.  May need to reduce dose to 2.5 mg if hemoglobin continues to downtrend.  Will get CTA to evaluate for PE given  ongoing tachycardia, leukocytosis, and low-grade fevers with generalized chest/abdominal pain and history of DVT 9-9 and 9-23--would need this evaluated prior to adjustments of anticoagulation  - 10-1: CTA overnight with PE right upper and lower lobe, started on heparin ggt without bolus overnight - planning for IVC placement with heparin hold 4 hours prior- -10-2: IR may do IVC filter placement today or tomorrow; pharmacy informed, will resume heparin drip if procedure not planned by 5 PM - 10-3:  OR for IVC placement today.  Discussed with oncology Dr. Onesimo and patient's daughter, still some role for prophylactic dose Lovenox  for clot stabilization and prevention of extension of PE/DVTs.  Will place for tomorrow a.m., monitor CBC daily, DC Lovenox  if repeated transfusions required. -- Tolerating current regimen well  3. Pain Management: tylenol  prn.    - 10/1: Tx as above; can now schedule tylenol  1000 mg TID --improved  10/6: Continues to complain of generalized, diffuse pain.  No complaints of pain over the weekend.  Continue current regimen.  10-7: No complaints of pain  4. Mood/Behavior/Sleep: LCSW to follow for evaluation and support.             --melatonin prn insomnia.              -antipsychotic agents: N\/A   - 9-30: Withdrawn, poor attention.  Start fluoxetine 20 mg daily for motor recovery and behavior.  Start sleep log.   - sleep log appropriate  10-6: Starting to see some improving interaction and improving aphasia.  Continue  current regimen.  5. Neuropsych/cognition: This patient is not capable of making decisions on her own behalf.   6. Skin/Wound Care: Routine pressure relief measures.  7. Fluids/Electrolytes/Nutrition: Monitor I/O. Check CMET in am.    -9-30: A.m. labs stable  - 10-2: Per daughter, eating meals brought in from home, monitor weights weekly  10-6: P.o. intake sporadic; ate 100% of breakfast, 0% lunch and dinner.  10-8: Ate about half of lunch.  Eating 100% of meals that family bring.    8.   Acute on chronic anemia: Monitor for signs of bleeding. H/H stable 6.1-->8.7.  FOBT -9-23. --add iron supplement as Iron level <10.  -  9-30: Hemoglobin downtrending, 7.3.  No obvious source of bleeding.  Added FOBT today.  Repeat H&H in AM; may need to transfuse if any lower.  Continue Eliquis  at current dose but if signs of bleeding may need to discuss adjusting to 2.5 mg daily. 10-1: Hemoglobin 7.1, on heparin drip. Monitor daily. Transfusion 1 U PRBC ordered; repeat h/h this evening. Holley Schmitz discussed with patient's daughter over the phone.  10/2: Hgb responses 8.0; monitor - ?transition to oral ASA +/- IVC filter to assist in reducing bleed, will d/w GI and Onc today 10-3: Hemoglobin stable 8.4; was off of heparin drip most of yesterday.  After IVC placement today + ppx lovenox  as above. Monitor daily. NO ASA.  10-8: Iron studies significant for iron deficiency anemia.  Hemoglobin 7.0 today; will transfuse 2 units given ongoing slow bleed likely secondary to cancer - 10-9: Hemoglobin improved to 10.4 after transfusion yesterday.  Stable, no current signs of bleeding.  9.  Resting tachycardia: HR ranging in 110-120 at rest. CTA to rule out PE?             -pt is not in distress, breathing comfortably, no chest pain, on eliquis              --Afebrile X 24 hours.   --  Monitor for symptoms with increase in activity.  9-30: Remained in the 90s overnight and throughout therapies this a.m.; coming up in the  110s this afternoon--stable 10/1-3 -- Stable  10. Leukocytosis: WBC on upward trend--16.0-->14.5-->16.3              -however, pt is afebrile, urine/chest clear, on full a/c --Sepsis work up prn temp elevation 9-30: WBCs downtrending 14.6--have been stable at approximately this level since 9-24.  No fevers, chills, obvious lines of infection.  Is complaining of some chest pain and temperature 100 this afternoon; CTA ordered as above -10/1: Right upper lower lobe PE, along with multiple intra-abdominal masses concerning for metastatic cancer; most likely source. Tx PE as above and monitor.  - 10/2 stable 14-16 10-3: Increased to 18.  Patient with expressive aphasia, difficulty performing review of systems, complicated by cancer, multiple blood clots, and steroids.  Will get urinalysis. 10/4- Ordered another U/A and Cx and Blood Cx's after d/w ID- T was 100.9- so started Rocephin 2 grams q 24 hours per ID request and will follw Cx's and clinically. Also gave tylenol  and on yellow MEWS.  10/5- MEWS back to green- no fever- blood Cx's no growth so far- CXR (-) and U/A (-)-  if Cx's are negative, can stop.   10/6 Cx read pending; CXR negative. Continue rocephin. WBC down slightly  10-7: Blood cultures negative x 2 days.  DC Rocephin.  10-8: Continues to improve, some subclinical fevers overnight.  Likely secondary to PE.  --9: Stable at 12-13.  11.  Elevated ALP, other LFTs are normal.  Trend.  12.  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  CT A/p 9/30 with: IMPRESSION: 1. Multiple hepatic masses consistent with metastases. The most likely primary is gastric given the circumferentially thickened mid to distal stomach, and hypogastric and gastrohepatic ligament adenopathy. 2. 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 3. Small amount of pelvic ascites.  - Called patient's daughter as above, left voicemail to review results. - Oncology consult placed for today; GI consult  also placed for biopsy.   - 10/2: CEA, CA-19 pending. Daughter declined EGD for biopsy this AM. Per Oncology, may be able to perform liver biopsy for pathology; will follow up with Dr Lanny.   - 10/3: Liver biopsy performed today; pathology pending.  Appreciate Dr. 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  Plan to continue rehab and reassess functional gains closer to discharge.  10/6: Biopsy results pending, CEA high, CA 19.9 within normal limits.    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  Dr. Dr. Lanny to discuss results with  patient and her daughter today --will follow-up with them as outpatient in 2 weeks to discuss treatment options  12.  Constipation.  Last bowel movement recorded 9-12.  Has been eating very little.  Will get KUB today.  10/3- Per pt's daughter, LBM was Wed/Th- and was soft- doesn't want me to give additional bowel meds  10/5- Still no BM- ate a t little more today- daughter doesn't want her ot get more bowel meds  LBM 10/1-2 per daughter--not eating much  10-8: Add MiraLAX  daily and Colace 100 mg twice daily--patient refused, daughter states she is having daily bowel movements, DC'd.  13.  Chronic steroid use.  On prednisone 5 mg daily since admission.     LOS: 10 days A FACE TO FACE EVALUATION WAS PERFORMED  Joesph JAYSON Likes 08/10/2024, 9:34  AM

## 2024-08-10 NOTE — Progress Notes (Signed)
 Inpatient Rehabilitation Care Coordinator Discharge Note   Patient Details  Name: Tiffany [name] MRN: [id number] Date of Birth: 07-23-1941   Discharge location: d/c to home with her dtr and family  Length of Stay: 11 days  Discharge activity level: Min Asst  Home/community participation: Limited  Patient response un:Yzjouy Literacy - How often do you need to have someone help you when you read instructions, pamphlets, or other written material from your doctor or pharmacy?: Never  Patient response un:Dnrpjo Isolation - How often do you feel lonely or isolated from those around you?: Patient unable to respond  Services provided included: MD, RD, PT, OT, SLP, RN, CM, Pharmacy, Neuropsych, SW, TR  Financial Services:  Field seismologist Utilized: Private Insurance [employer]  Choices offered to/list presented to: patient dtr  Follow-up services arranged:  Home Health, DME Home Health Agency: family deferred at this time    DME : [employer] for [facility] and w/c    Patient response to transportation need: Is the patient able to respond to transportation needs?: Yes In the past 12 months, has lack of transportation kept you from medical appointments or from getting medications?: No In the past 12 months, has lack of transportation kept you from meetings, work, or from getting things needed for daily living?: No   Patient/Family verbalized understanding of follow-up arrangements:  Yes  Individual responsible for coordination of the follow-up plan: contact pt dtr Tiffany [name]  Confirmed correct DME delivered: Tiffany [name] 08/10/2024    Comments (or additional information):fam edu completed  Summary of Stay    Date/Time Discharge Planning CSW  08/07/24 1414 Pt will d/c to home with her dtr who is primary caregiver during the week and support from granddaughters as well. Pt son in law will help on the weekend when pt dtr is at work. SW will confirm there are no  barriers to discharge. AAC  08/01/24 1043 TBA AAC       Tiffany [name]

## 2024-08-10 NOTE — Discharge Summary (Signed)
 Physician Discharge Summary  Patient ID: Tiffany [name] MRN: [id number] DOB/AGE: 1941/04/26 83 y.o.  Admit date: 07/31/2024 Discharge date: 08/10/2024  Discharge Diagnoses:  Principal Problem:   Acute ischemic left middle cerebral artery (MCA) stroke (HCC) Active Problems:   Aphasia   Cognitive and neurobehavioral dysfunction   Acute on chronic anemia   Gastric mass   Abnormal finding on GI tract imaging   Acute pulmonary embolism (HCC)   At risk for dehydration due to poor fluid intake   Cancer, metastatic to liver [facility])   Discharged Condition: stable  Significant Diagnostic Studies: DG CHEST PORT 1 VIEW Result Date: 08/06/2024 EXAM: 1 VIEW(S) XRAY OF THE CHEST 08/06/2024 08:47:00 AM COMPARISON: 10/17/2023 CLINICAL HISTORY: Acute ischemic left middle cerebral artery (MCA) stroke (HCC). FINDINGS: LUNGS AND PLEURA: Low lung volumes. No focal pulmonary opacity. No pulmonary edema. No pleural effusion. No pneumothorax. HEART AND MEDIASTINUM: Aortic atherosclerosis. No acute abnormality of the cardiac and mediastinal silhouettes. BONES AND SOFT TISSUES: No acute osseous abnormality. IMPRESSION: 1. No acute cardiopulmonary abnormality 2. Aortic atherosclerosis 3. Low lung volumes Electronically signed by: Katheleen Faes MD 08/06/2024 09:37 AM EDT RP Workstation: [id number]   IR IVC FILTER PLMT / S&I PORTER GUID/MOD SED Result Date: 08/04/2024 INDICATION: 83 year old with evidence for intra-abdominal neoplastic disease. 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 Request for a IVC filter because the patient is not a good anticoagulation candidate. EXAM: IVC FILTER PLACEMENT; IVC VENOGRAM; ULTRASOUND FOR VASCULAR ACCESS Physician: Juliene SAUNDERS. Henn, MD MEDICATIONS: Moderate sedation ANESTHESIA/SEDATION: Moderate (conscious) sedation was employed during this procedure. A total of Versed 0.5 mg and fentanyl 25 mcg was administered intravenously at the order of the provider  performing the procedure. Total intra-service moderate sedation time: 15 minutes. Patient's level of consciousness and vital signs were monitored continuously by radiology nurse throughout the procedure under the supervision of the provider performing the procedure. CONTRAST:  50 mL Omnipaque 300 FLUOROSCOPY: Radiation Exposure Index (as provided by the fluoroscopic device): 14 mGy Kerma COMPLICATIONS: None immediate. PROCEDURE: Informed consent was obtained for an IVC venogram and filter placement. Ultrasound demonstrated a patent right internal jugular vein. Ultrasound images were obtained for documentation. The right neck was prepped and draped in a sterile fashion. Maximal barrier sterile technique was utilized including caps, mask, sterile gowns, sterile gloves, sterile drape, hand hygiene and skin antiseptic. The skin was anesthetized with 1% lidocaine. A 21 gauge needle was directed into the vein with ultrasound guidance and a micropuncture dilator set was placed. A wire was advanced into the IVC. The filter sheath was advanced over the wire into the IVC. An IVC venogram was performed. Fluoroscopic images were obtained for documentation. A Bard Denali filter was deployed below the lowest renal vein. A follow-up venogram was performed and the vascular sheath was removed with manual compression. FINDINGS: IVC was patent. Bilateral renal veins were identified. The filter was deployed below the lowest renal vein. Follow-up venogram confirmed placement within the IVC and below the renal veins. IMPRESSION: Successful placement of a retrievable IVC filter. PLAN: Due to patient related comorbidities and/or clinical necessity, this IVC filter should be considered a permanent device. This patient will not be actively followed for future filter retrieval. Electronically Signed   By: Juliene Balder M.D.   On: 08/04/2024 16:13   IR US  LIVER BIOPSY Result Date: 08/04/2024 INDICATION: 83 year old with liver lesions and needs  a tissue diagnosis. EXAM: ULTRASOUND-GUIDED LIVER LESION BIOPSY MEDICATIONS: Moderate sedation ANESTHESIA/SEDATION: Moderate (conscious) sedation  was employed during this procedure. A total of Versed 0.5 mg and Fentanyl 50 mcg was administered intravenously by the radiology nurse. Total intra-service moderate Sedation Time: 18 minutes. The patient's level of consciousness and vital signs were monitored continuously by radiology nursing throughout the procedure under my direct supervision. FLUOROSCOPY TIME:  None COMPLICATIONS: None immediate. PROCEDURE: Informed written consent was obtained from the patient/family after a thorough discussion of the procedural risks, benefits and alternatives. All questions were addressed. Maximal Sterile Barrier Technique was utilized including caps, mask, sterile gowns, sterile gloves, sterile drape, hand hygiene and skin antiseptic. A timeout was performed prior to the initiation of the procedure. Abdomen was evaluated with ultrasound. A right hepatic lesion was identified for biopsy. The right side of the abdomen was prepped with chlorhexidine and sterile field was created. Skin was anesthetized using 1% lidocaine. Small incision was made. Using ultrasound guidance, 17 gauge coaxial needle was directed into the right hepatic lesion. Total of 3 core biopsies were performed with an 18 gauge core device. Three adequate specimens obtained and placed in formalin. Gel-Foam slurry was injected as the 17 gauge needle was removed. Bandage placed over the puncture site. FINDINGS: Heterogeneous hepatic lesions. A large lesion in the right hepatic lobe with central necrosis was biopsied. 3 adequate specimens were obtained. No immediate bleeding or hematoma formation. IMPRESSION: Ultrasound-guided core biopsy of a right hepatic lesion. Electronically Signed   By: Juliene Balder M.D.   On: 08/04/2024 16:07   DG Abd 1 View Result Date: 08/04/2024 CLINICAL DATA:  Constipation. EXAM: ABDOMEN - 1  VIEW COMPARISON:  CT 08/01/2024 FINDINGS: No bowel dilatation or evidence of obstruction. Small to moderate volume of formed stool throughout the colon, diminished stool burden from prior CT. Calcifications in the right pelvis correspond to retroperitoneal phleboliths. Overlying artifacts project over the pelvis. IMPRESSION: Small to moderate volume of formed stool throughout the colon, diminished stool burden from prior CT. Electronically Signed   By: Andrea Gasman M.D.   On: 08/04/2024 13:54   CT ABDOMEN PELVIS W CONTRAST Result Date: 08/02/2024 CLINICAL DATA:  The patient had a CTA chest earlier today for pulmonary embolism. A mass was noted in the central liver as well as evidence of peritoneal metastases in the left upper abdomen. EXAM: CT ABDOMEN AND PELVIS WITH CONTRAST TECHNIQUE: Multidetector CT imaging of the abdomen and pelvis was performed using the standard protocol following bolus administration of intravenous contrast. RADIATION DOSE REDUCTION: This exam was performed according to the departmental dose-optimization program which includes automated exposure control, adjustment of the mA and/or kV according to patient size and/or use of iterative reconstruction technique. CONTRAST:  60mL OMNIPAQUE IOHEXOL 350 MG/ML SOLN COMPARISON:  Ultrasound complete abdomen 01/12/2019 was negative except for a 5 cm left renal cyst. The only other cross-sectional comparison is today's CTA chest. FINDINGS: Lower chest: There is mild cardiomegaly, minimal pericardial effusion. Scattered three-vessel coronary calcifications. Lung bases are clear apart from posterior atelectatic change in the lower lobes. Hepatobiliary: There are multiple hepatic masses consistent with metastases. The most likely primary is gastric given the observation of a circumferentially thickened mid to distal stomach and gastrohepatic ligament adenopathy. The largest mass appears to be a coalescence of at least 3 masses involving portions of  segments 5 and 8. This measures 7.7 x 6.6 cm on 3:18. There is a segment 4 mass measuring 2.8 x 3.4 cm on 3:25, and a segment 1 mass significantly narrowing the intrahepatic IVC measuring 4.3 x 4.1 cm on  3:17. Out laterally in segment 2 there is another mass measuring 5.4 x 3.3 cm on 3:17. There are few additional scattered small masses in both lobes. The gallbladder and bile ducts are unremarkable. Pancreas: No abnormality Spleen: No abnormality. Adrenals/Urinary Tract: There is no adrenal mass. In the left upper pole there is a 4.1 cm homogeneous Bosniak 2 cyst, Hounsfield density is 22.2. A second Bosniak 2 cyst just below this measures 1.2 cm and 23 Hounsfield units, and on the right there is a third parapelvic Bosniak 2 cyst in the inferior pole measuring 1.9 cm on 23 Hounsfield units. No follow-up imaging is recommended. There is no discrete renal mass enhancement. There is contrast in the collecting systems and ureters which would obscure stones but there is no hydronephrosis. The bladder wall and lumen are unremarkable. There is dense contrast opacifying the bladder. Stomach/Bowel: Circumferentially thickened wall in the mid distal stomach particularly posteriorly and inferiorly, suspicious for infiltrating disease such as carcinoma. The small bowel is normal caliber. The appendix is normal. There is moderate retained stool. No overt colonic wall thickening. Scattered uncomplicated diverticulosis. Vascular/Lymphatic: Aortic atherosclerosis. No AAA or acute vascular findings. There is mass effect on the intrahepatic IVC due to a segment 1 liver metastasis described above. No thrombus is seen within it. The portal vein and branches are widely patent. There is an enlarged necrotic gastrohepatic ligament lymph measuring 2.4 x 2.2 cm on 3:21. There are pathologic lymph nodes inferior to the stomach as well, ranging from 9 mm short axis up to the largest right of the midline measuring 2.4 x 1.6 cm on 3:38.  Additional evidence of peritoneal metastatic disease with left upper quadrant nodular peritoneal metastases measuring up to 1.6 x 1.5 cm on 3:26. No adenopathy is seen in the pelvis. Reproductive: Status post hysterectomy. No adnexal masses. Other: Small amount of pelvic ascites. No drainable pocket. No free air or free hemorrhage. Small umbilical fat hernia. Musculoskeletal: No regional bone metastasis is seen. Mild degenerative change thoracic and lumbar spine. IMPRESSION: 1. Multiple hepatic masses consistent with metastases. The most likely primary is gastric given the circumferentially thickened mid to distal stomach, and hypogastric and gastrohepatic ligament adenopathy. 2. 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 3. Small amount of pelvic ascites. 4. Aortic and coronary artery atherosclerosis. 5. Cardiomegaly with minimal pericardial effusion. 6. Umbilical fat hernia. 7. These results will be called to the ordering clinician or representative by the Radiologist Assistant, and communication documented in the PACS or [employer]. Aortic Atherosclerosis (ICD10-I70.0). Electronically Signed   By: Francis Quam M.D.   On: 08/02/2024 00:35   CT Angio Chest Pulmonary Embolism (PE) W or WO Contrast Result Date: 08/01/2024 CLINICAL DATA:  Fever tachycardia EXAM: CT ANGIOGRAPHY CHEST WITH CONTRAST TECHNIQUE: Multidetector CT imaging of the chest was performed using the standard protocol during bolus administration of intravenous contrast. Multiplanar CT image reconstructions and MIPs were obtained to evaluate the vascular anatomy. RADIATION DOSE REDUCTION: This exam was performed according to the departmental dose-optimization program which includes automated exposure control, adjustment of the mA and/or kV according to patient size and/or use of iterative reconstruction technique. CONTRAST:  60mL OMNIPAQUE IOHEXOL 350 MG/ML SOLN COMPARISON:  Chest x-ray 10/17/2023 FINDINGS: Cardiovascular:  Satisfactory opacification of the pulmonary arteries to the segmental level. Small emboli within right upper lobe subsegmental branches. Small volume right lower lobe segmental and subsegmental PE. Mild aortic atherosclerosis. No aneurysm. Mild coronary vascular calcification. Borderline cardiomegaly. No pericardial effusion Mediastinum/Nodes: Patent trachea. No  thyroid mass. No suspicious lymph nodes. Esophagus within normal limits except for small hiatal hernia Lungs/Pleura: Mild dependent atelectasis. No consolidation or pleural effusion. Punctate 2 mm lingular pulmonary nodule, no specific imaging follow-up is recommended. Upper Abdomen: Incompletely visualized is vague hypodensity in the central liver, series 5 image 114. Soft tissue nodularity within the left upper quadrant incompletely assessed, series 5 image 91 through 114. Musculoskeletal: No acute osseous abnormality. 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 IMPRESSION: 1. Small volume right upper lobe and right lower lobe segmental and subsegmental pulmonary emboli. 2. Incompletely visualized vague hypodensity in the central liver, raising concern for possible liver lesion/mass. In addition, there is incompletely visualized nodularity within the left upper quadrant as could be seen with peritoneal metastatic disease. Recommend dedicated abdominopelvic contrast-enhanced CT. 3. Critical Value/emergent results were called by telephone at the time of interpretation on 08/01/2024 at 8:52 pm to provider NP Debby , who verbally acknowledged these results. Aortic Atherosclerosis (ICD10-I70.0). Electronically Signed   By: Luke Bun M.D.   On: 08/01/2024 20:53     Labs:  Basic Metabolic Panel:    Latest Ref Rng & Units 08/10/2024    5:39 AM 08/07/2024    6:15 AM 08/03/2024    5:38 AM  BMP  Glucose 70 - 99 mg/dL 87  95  85   BUN  8 - 23 mg/dL 20  22  15    Creatinine 0.44 - 1.00 mg/dL 9.29  9.14  9.15   Sodium 135 - 145 mmol/L 139  141  138   Potassium 3.5 - 5.1 mmol/L 3.7  3.5  3.9   Chloride 98 - 111 mmol/L 105  110  108   CO2 22 - 32 mmol/L 20  21  21    Calcium 8.9 - 10.3 mg/dL 8.7  8.4  8.2      CBC: Recent Labs  Lab 08/08/24 0512 08/09/24 0457 08/09/24 2147 08/10/24 0539  WBC 14.7* 12.7*  --  13.3*  HGB 7.6* 7.0* 10.2* 10.4*  HCT 24.4* 22.4* 31.6* 32.9*  MCV 81.6 81.5  --  83.5  PLT 422* 424*  --  349    Iron/TIBC/Ferritin/ %Sat    Component Value Date/Time   IRON 12 (L) 08/09/2024 0457   TIBC 224 (L) 08/09/2024 0457   FERRITIN 70 08/09/2024 0457   IRONPCTSAT 5 (L) 08/09/2024 0457    CBG: No results for input(s): GLUCAP in the last 168 hours.  Brief HPI:   Tiffany [name] is an 83 y.o. female with history of HTN, syncope, pre-diabetes, recent DVT (after plane ride) on eliquis  who was admitted on 07/23/24 with garbled speech and confusion. She was also found to have severe anemia with Hgb 6.1 and transfused with 2 units PRBC. CTA negative for LVO and showed posterior L-MCA M3 or M4 branch occlusion and MRI brain revealed large acute L-MCA territory infarcts and additional tiny acute infarcts in right perirolantic and frontotemporal cortex, right parietal and bilateral cerebellar hemispheres. She was transfused with 2 units PRBCs with recommendations to follow-up with Dr. Stacia.  Stroke felt to be embolic in nature and Eliquis  was resumed on 09/22 with recommendations of 30-day event monitor after discharge to rule out A-fib.  Hospital course significant for ongoing issues with tachycardia as well as fever up to 101 on 09/27.  She was treated with IV fluids and repeat BLE Dopplers done showing  acute DVT right popliteal, peroneal and posterior tibial veins.  PT, OT, ST were consulted and patient was noted to have expressive deficits requiring min to mod assist with ADLs, had problems with  sequencing and initiation and required contact-guard to min assist with mobility.  She was independent prior to admission.  CIR was recommended due to functional decline.   Hospital Course: Tiffany [name] was admitted to rehab 07/31/2024 for inpatient therapies to consist of PT, ST and OT at least three hours five days a week. Past admission physiatrist, therapy team and rehab RN have worked together to provide customized collaborative inpatient rehab. Blood pressures were monitored on TID basis and has been stable but noted to have persistent tachycardia since admission. Follow up CBC past admission showed recurrent drop in Hgb to 7.1 and she was transfused with one unit PRBC. CT chest ordered to rule out PE prior to reducing Eliquis  dose due to concerns of ongoing bleeding.  CTA revealed RUL/RLE subsegmental and segmental PE as well as possibility of liver mass and peritoneal metastatic disease. CT A/P ordered for work up and showed circumferentially thickened mid to distal stomach with hypogastric and gastrohepatic ligament adenopathy, peritoneal metastases LUQ and multiple metastatic liver masses likely gastric primary. Daughter was updated on results and elected full scope of care and full code except for intubation.    Patient was discussed with Hem/Onc as well as Dr Jerri who felt that patient was at high risk for bleeding with DOAC or antiplatelet therapy. DOAC d/c, she was started on IV heparin without bolus. GI consulted for EGD and biopsy but family declined due to risk of anesthesia. IVC filter placed on 10/03 and heparin changed to SQ Lovenox .  Dr. 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 Tumor markers CEA elevated at 326 and CA 19-9 WNL. She recommended transfusion prn for Hgb <7.0.  Family has agreed to continue SQ Lovenox  to decrease clot burden.    Po intake has been poor and  nutritional supplements added as well as IVF added to help maintain hydration/nutritional status.  Prozac was added to help with  motor recovery and for withdrawn mood.  She had recurrent drop in Hgb to 7.0 on 10/08 and was transfused with one unit PRBC. Iron panel showed low iron/iron stores.   She has also had persistent leucocytosis with rise in WBC to 18.9. She was pan-cultured and started on IV rocephin per discussion with ID. CXR showed low lung volumes.  UCS and BC X 2 negative and rocephin d/c on 10/07. WBC trending down to 13.3 today. Therapy has been modified due to issues with endurance, inconsistent participation and poor frustration tolerance due to aphasia.  She requires supervision to [facility] and family has deferred follow up therapy for now. They will follow up with Dr. Lanny and Dr Emeline after discharge.    Rehab course: During patient's stay in rehab weekly team conferences were held to monitor patient's progress, set goals and discuss barriers to discharge. At admission, patient required CGA-min assist with ADL tasks and min assist with mobility. She exhibited severe receptive> expressive aphasia with 50% accuracy w/ Y/N question and was able to follow simple functional commands. Severe deficits also noted in reading comprehension.  She has required encouragement for participation and has progress has been limited. She is abel to complete ADL tasks with supervision.  She is able to ambulate CGA- min assist for 100' with increased  shuffling and LOB to R. She is able to ambulate 67' with RW and CGA.  She continues to be limited by fluent aphasia with frustration, has no awareness of errors and is unable to complete structural tasks. Family education has been completed.   Discharge disposition: 01-Home or Self Care  Diet: regular  Special Instructions: Family to assist with medications as well as financial tasks. No driving.  Follow up with PCP for repeat CBC in 1-2 weeks.    Allergies as  of 08/11/2024       Reactions   Elemental Sulfur Hives   Shellfish Allergy Anaphylaxis   Shellfish Allergy    Sulfa Antibiotics Hives        Medication List     STOP taking these medications    alendronate 70 MG tablet Commonly known as: FOSAMAX   D3-50 1.25 MG (50000 UT) capsule Generic drug: Cholecalciferol   Eliquis  5 MG Tabs tablet Generic drug: apixaban        TAKE these medications    Acetaminophen  Extra Strength 500 MG Tabs Take 2 tablets (1,000 mg total) by mouth 3 (three) times daily.   ascorbic acid 500 MG tablet Commonly known as: VITAMIN C Take 1 tablet (500 mg total) by mouth daily.   atorvastatin 20 MG tablet Commonly known as: LIPITOR Take 1 tablet (20 mg total) by mouth daily.   dorzolamide 2 % ophthalmic solution Commonly known as: TRUSOPT Place 1 drop into both eyes 3 (three) times daily.   enoxaparin  40 MG/0.4ML injection Commonly known as: LOVENOX  Inject 0.4 mLs (40 mg total) into the skin daily.   FeroSul 325 (65 Fe) MG tablet Generic drug: ferrous sulfate Take 1 tablet (325 mg total) by mouth daily with breakfast.   Lumigan 0.01 % Soln Generic drug: bimatoprost Place 1 drop into both eyes at bedtime. What changed: Another medication with the same name was removed. Continue taking this medication, and follow the directions you see here.   metoprolol tartrate 25 MG tablet Commonly known as: LOPRESSOR Take 1 tablet (25 mg total) by mouth daily. What changed:  medication strength how much to take   mirtazapine 15 MG tablet Commonly known as: REMERON Take 1 tablet (15 mg total) by mouth at bedtime. What changed:  medication strength how much to take   pantoprazole 40 MG tablet Commonly known as: PROTONIX Take 1 tablet (40 mg total) by mouth 2 (two) times daily.   PREBIOTIC PRODUCT PO Take 1 capsule by mouth daily.   predniSONE 5 MG tablet Commonly known as: DELTASONE Take 1 tablet (5 mg total) by mouth daily.    PROBIOTIC PO Take 1 capsule by mouth daily.        Follow-up Information     Benjamine Aland, MD Follow up.   Specialty: Family Medicine Why: Call in 1-2 days for post hospital follow up Contact information: [address], [address] [place] [address] [phone]         Lanny Callander, MD Follow up.   Specialties: Hematology, Oncology Contact information: [address] [place] [address] [phone]         Emeline Search C, DO Follow up.   Specialty: Physical Medicine and Rehabilitation Why: office will call you with follow up appointment Contact information: [address] [place] [address] [place] [address] [phone]         [facility] Follow up.   Contact information: [address]     [address] [place]  [address]  [phone]                Signed: Sharlet GORMAN Schmitz 08/14/2024, 3:27 PM

## 2024-08-10 NOTE — Plan of Care (Signed)
  Problem: RH Tub/Shower Transfers Goal: LTG Patient will perform tub/shower transfers w/assist (OT) Description: LTG: Patient will perform tub/shower transfers with assist, with/without cues using equipment (OT) Outcome: Adequate for Discharge   Problem: RH Balance Goal: LTG Patient will maintain dynamic standing with ADLs (OT) Description: LTG:  Patient will maintain dynamic standing balance with assist during activities of daily living (OT)  Outcome: Completed/Met   Problem: RH Bathing Goal: LTG Patient will bathe all body parts with assist levels (OT) Description: LTG: Patient will bathe all body parts with assist levels (OT) Outcome: Completed/Met   Problem: RH Dressing Goal: LTG Patient will perform lower body dressing w/assist (OT) Description: LTG: Patient will perform lower body dressing with assist, with/without cues in positioning using equipment (OT) Outcome: Completed/Met   Problem: RH Toileting Goal: LTG Patient will perform toileting task (3/3 steps) with assistance level (OT) Description: LTG: Patient will perform toileting task (3/3 steps) with assistance level (OT)  Outcome: Completed/Met   Problem: RH Toilet Transfers Goal: LTG Patient will perform toilet transfers w/assist (OT) Description: LTG: Patient will perform toilet transfers with assist, with/without cues using equipment (OT) Outcome: Completed/Met

## 2024-08-11 ENCOUNTER — Other Ambulatory Visit (HOSPITAL_COMMUNITY): Payer: Self-pay

## 2024-08-11 LAB — CBC
HCT: 31.9 % — ABNORMAL LOW (ref 36.0–46.0)
Hemoglobin: 10.2 g/dL — ABNORMAL LOW (ref 12.0–15.0)
MCH: 26.4 pg (ref 26.0–34.0)
MCHC: 32 g/dL (ref 30.0–36.0)
MCV: 82.6 fL (ref 80.0–100.0)
Platelets: 380 K/uL (ref 150–400)
RBC: 3.86 MIL/uL — ABNORMAL LOW (ref 3.87–5.11)
RDW: 18.8 % — ABNORMAL HIGH (ref 11.5–15.5)
WBC: 17.1 K/uL — ABNORMAL HIGH (ref 4.0–10.5)
nRBC: 0 % (ref 0.0–0.2)

## 2024-08-11 NOTE — Progress Notes (Signed)
 Patient ID: Tiffany [name], female   DOB: Jun 19, 1941, 83 y.o.   MRN: [id number] Contacted daughter-Noella to let know there is a co-pay for the equipment and she will need to call to pay this prior to [employer] delivering the equipment to her room. Daughter to call to pay the co-pay.

## 2024-08-11 NOTE — Plan of Care (Signed)
  Problem: RH Expression Communication Goal: LTG Patient will express needs/wants via multi-modal(SLP) Description: LTG:  Patient will express needs/wants via multi-modal communication (gestures/written, etc) with cues (SLP) Outcome: Not Met (add Reason) inconsistent mastery Goal: LTG Patient will increase word finding of common (SLP) Description: LTG:  Patient will increase word finding of common objects/daily info/abstract thoughts with cues using compensatory strategies (SLP). Outcome: Not Met (add Reason) inconsistent mastery   Problem: RH Comprehension Communication Goal: LTG Patient will comprehend basic/complex auditory (SLP) Description: LTG: Patient will comprehend basic/complex auditory information with cues (SLP). Outcome: Completed/Met   Problem: RH Pre-functional/Other (Specify) Goal: RH LTG SLP (Specify) 1 Description: RH LTG SLP (Specify) 1 08/11/2024 0751 by Berna Recardo LABOR, CCC-SLP Outcome: Completed/Met 08/11/2024 0751 by Berna Recardo LABOR, CCC-SLP Outcome: Not Met (add Reason)

## 2024-08-11 NOTE — Progress Notes (Signed)
 PROGRESS NOTE   Subjective/Complaints: No acute complaints.  No events overnight.  Continues to want to go home. Vitals are stable, no fevers, WBC slightly up to 17 today but has been between 12 and 17 with negative infectious workup throughout her admission. Hemoglobin stable   Unable to obtain ROS due to patient cognitive status.     Objective:   No results found.   Recent Labs    08/10/24 0539 08/11/24 0554  WBC 13.3* 17.1*  HGB 10.4* 10.2*  HCT 32.9* 31.9*  PLT 349 380   Recent Labs    08/10/24 0539  NA 139  K 3.7  CL 105  CO2 20*  GLUCOSE 87  BUN 20  CREATININE 0.70  CALCIUM 8.7*     Intake/Output Summary (Last 24 hours) at 08/11/2024 1043 Last data filed at 08/10/2024 1811 Gross per 24 hour  Intake 218 ml  Output --  Net 218 ml          Physical Exam: Vital Signs Blood pressure (!) 142/76, pulse 95, temperature 98.8 F (37.1 C), temperature source Oral, resp. rate 19, height 5' 2 (1.575 m), weight 55.4 kg, SpO2 97%.  General: awake, alert, appropriate, laying in bed. HENT: conjugate gaze; moist oropharynx. CV: regular rate and rhythm; no JVD Pulmonary: CTA B/L; no W/R/R- good air movement  GI: soft, NT, ND, (+)BS- hypoactive.  No further tenderness on palpation. Psychiatric: Dysphoric. Follows commands appropriately.  Skin: C/D/I. No apparent lesions. MSK:      No apparent deformity.  Cognition: Awake, alert, oriented to self and hospital with cues.  Not oriented to time. + Expressive aphasia--waxes and wanes depending on lethargy; better today Insight: Poor insight into current condition.  Sensation: Equal and intact in BL UE and Les.  Reflexes:  Negative Hoffman's and babinski signs bilaterally.  CN: Mild R facial droop--unchanged Coordination: No apparent tremors. No ataxia   Spasticity: MAS 0 in all extremities.  Strength: Moving all 4 extremities antigravity against  resistance; 4/5 throughout  Physical exam unchanged from the above on reexamination 08/11/24     Assessment/Plan: 1. Functional deficits which require 3+ hours per day of interdisciplinary therapy in a comprehensive inpatient rehab setting. Physiatrist is providing close team supervision and 24 hour management of active medical problems listed below. Physiatrist and rehab team continue to assess barriers to discharge/monitor patient progress toward functional and medical goals  Care Tool:  Bathing    Body parts bathed by patient: Right arm, Left arm, Chest, Abdomen, Front perineal area, Buttocks, Right upper leg, Left upper leg, Right lower leg, Left lower leg, Face         Bathing assist Assist Level: Supervision/Verbal cueing     Upper Body Dressing/Undressing Upper body dressing   What is the patient wearing?: Pull over shirt    Upper body assist Assist Level: Set up assist    Lower Body Dressing/Undressing Lower body dressing      What is the patient wearing?: Underwear/pull up, Pants     Lower body assist Assist for lower body dressing: Supervision/Verbal cueing     Toileting Toileting    Toileting assist Assist for toileting: Supervision/Verbal cueing  Transfers Chair/bed transfer  Transfers assist     Chair/bed transfer assist level: Contact Guard/Touching assist     Locomotion Ambulation   Ambulation assist      Assist level: Contact Guard/Touching assist Assistive device: Walker-rolling Max distance: 100'   Walk 10 feet activity   Assist     Assist level: Contact Guard/Touching assist Assistive device: Walker-rolling   Walk 50 feet activity   Assist    Assist level: Contact Guard/Touching assist Assistive device: Walker-rolling    Walk 150 feet activity   Assist Walk 150 feet activity did not occur: Safety/medical concerns  Assist level: Minimal Assistance - Patient > 75% Assistive device: No Device    Walk 10  feet on uneven surface  activity   Assist Walk 10 feet on uneven surfaces activity did not occur: Safety/medical concerns   Assist level: Minimal Assistance - Patient > 75% Assistive device: Other (comment) (No device, but holding to RHR)   Wheelchair     Assist Is the patient using a wheelchair?: Yes Type of Wheelchair: Manual    Wheelchair assist level: Dependent - Patient 0%      Wheelchair 50 feet with 2 turns activity    Assist        Assist Level: Dependent - Patient 0%   Wheelchair 150 feet activity     Assist      Assist Level: Dependent - Patient 0%   Blood pressure (!) 142/76, pulse 95, temperature 98.8 F (37.1 C), temperature source Oral, resp. rate 19, height 5' 2 (1.575 m), weight 55.4 kg, SpO2 97%.  Medical Problem List and Plan: 1. 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 Onc, GI consulted. Called patient's daughter and left VM to discuss; did discuss workup with patient this AM as well. Close monitorring for neurologic changes given heparin ggt and risk for hemorrhagic conversion; planning to place IVC filter and DC AC   -  10/7: family training this afternoon. Poor, variable participation. Did much better with family yesterday CGA mobility and transfers. OT downgrading to Min A. Plan for 24/7 with daughter and son in law. Max-total A for communication.   -- Changing therapy therapies to daily for poor tolerance.   -- Oncology to initiate goals of care discussion with family today after review only today after reviewing pathology.  Discussed this with patient's daughter--planning to pursue treatment, will follow-up 2 weeks as  outpatient  The patient is medically ready for discharge to home and will need follow-up with [facility]. In addition, they will need to follow up with their PCP, Oncology and Neurology.    2.  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   - 9-30: Given recent embolic  stroke, continue Eliquis  for now.  May need to reduce dose to 2.5 mg if hemoglobin continues to downtrend.  Will get CTA to evaluate for PE given ongoing tachycardia, leukocytosis, and low-grade fevers with generalized chest/abdominal pain and history of DVT 9-9 and 9-23--would need this evaluated prior to adjustments of anticoagulation  - 10-1: CTA overnight with PE right upper and lower lobe, started on heparin ggt without bolus overnight - planning for IVC placement with heparin hold 4 hours prior- -10-2: IR may do IVC filter placement today or tomorrow; pharmacy informed, will resume heparin drip if procedure not planned by 5 PM - 10-3:  OR for IVC placement today.  Discussed with oncology Dr. Onesimo and patient's daughter, still some role for prophylactic dose Lovenox  for clot stabilization and prevention of extension of PE/DVTs.  Will place for tomorrow a.m., monitor CBC daily, DC Lovenox  if repeated transfusions required. -- Tolerating current regimen well--will continue IVC filter and prophylactic dose Lovenox  on discharge  3. Pain Management: tylenol  prn.    - 10/1: Tx as above; can now schedule tylenol  1000 mg TID --improved  10/6: Continues to complain of generalized, diffuse pain.  No complaints of pain over the weekend.  Continue current regimen.  10-7: No  complaints of pain  4. Mood/Behavior/Sleep: LCSW to follow for evaluation and support.             --melatonin prn insomnia.              -antipsychotic agents: N\/A   - 9-30: Withdrawn, poor attention.  Start fluoxetine 20 mg daily for motor recovery and behavior.  Start sleep log.   - sleep log appropriate  10-6: Starting to see some improving interaction and improving aphasia.  Continue current regimen.  5. Neuropsych/cognition: This patient is not capable of making decisions on her own behalf.   6. Skin/Wound Care: Routine pressure relief measures.  7. Fluids/Electrolytes/Nutrition: Monitor I/O. Check CMET in am.    -9-30: A.m. labs stable  - 10-2: Per daughter, eating meals brought in from home, monitor weights weekly  10-6: P.o. intake sporadic; ate 100% of breakfast, 0% lunch and dinner.  10-8: Ate about half of lunch.  Eating 100% of meals that family bring.    8.   Acute on chronic anemia: Monitor for signs of bleeding. H/H stable 6.1-->8.7.  FOBT -9-23. --add iron supplement as Iron level <10.  -  9-30: Hemoglobin downtrending, 7.3.  No obvious source of bleeding.  Added FOBT today.  Repeat H&H in AM; may need to transfuse if any lower.  Continue Eliquis  at current dose but if signs of bleeding may need to discuss adjusting to 2.5 mg daily. 10-1: Hemoglobin 7.1, on heparin drip. Monitor daily. Transfusion 1 U PRBC ordered; repeat h/h this evening. Holley Schmitz discussed with patient's daughter over the phone.  10/2: Hgb responses 8.0; monitor - ?transition to oral ASA +/- IVC filter to assist in reducing bleed, will d/w GI and Onc today 10-3: Hemoglobin stable 8.4; was off of heparin drip most of yesterday.  After IVC placement today + ppx lovenox  as above. Monitor daily. NO ASA.  10-8: Iron studies significant for iron deficiency anemia.  Hemoglobin 7.0 today; will transfuse 2 units given ongoing slow bleed likely secondary to cancer - 10-9: Hemoglobin improved to 10.4 after  transfusion yesterday.  Stable, no current signs of bleeding. - 10-10: Hemoglobin stable.  9.  Resting tachycardia: HR ranging in 110-120 at rest. CTA to rule out PE?             -  pt is not in distress, breathing comfortably, no chest pain, on eliquis              --Afebrile X 24 hours.   --Monitor for symptoms with increase in activity.  9-30: Remained in the 90s overnight and throughout therapies this a.m.; coming up in the 110s this afternoon--stable 10/1-3 -- Stable  10. Leukocytosis: WBC on upward trend--16.0-->14.5-->16.3              -however, pt is afebrile, urine/chest clear, on full a/c --Sepsis work up prn temp elevation 9-30: WBCs downtrending 14.6--have been stable at approximately this level since 9-24.  No fevers, chills, obvious lines of infection.  Is complaining of some chest pain and temperature 100 this afternoon; CTA ordered as above -10/1: Right upper lower lobe PE, along with multiple intra-abdominal masses concerning for metastatic cancer; most likely source. Tx PE as above and monitor.  - 10/2 stable 14-16 10-3: Increased to 18.  Patient with expressive aphasia, difficulty performing review of systems, complicated by cancer, multiple blood clots, and steroids.  Will get urinalysis. 10/4- Ordered another U/A and Cx and Blood Cx's after d/w ID- T was 100.9- so started Rocephin 2 grams q 24 hours per ID request and will follw Cx's and clinically. Also gave tylenol  and on yellow MEWS.  10/5- MEWS back to green- no fever- blood Cx's no growth so far- CXR (-) and U/A (-)-  if Cx's are negative, can stop.   10/6 Cx read pending; CXR negative. Continue rocephin. WBC down slightly  10-7: Blood cultures negative x 2 days.  DC Rocephin.  10-8: Continues to improve, some subclinical fevers overnight.  Likely secondary to PE.  --9: Stable at 12-13   10-10: WBC up to 17 today.  No fevers, no other vital changes, no subjective complaints by the patient.  Has been between 12 and 18  during this hospitalization, with positive urinalysis but culture with multiple species and blood culture negative earlier in her stay; has been stable for several days off antibiotics.  Feel this is likely secondary to patient's multiple metastatic cancer, and no signs of new infectious process to warrant hold for discharge  11.  Elevated ALP, other LFTs are normal.  Trend.  12.  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  CT A/p 9/30 with: IMPRESSION: 1. Multiple hepatic masses consistent with metastases. The most likely primary is gastric given the circumferentially thickened mid to distal stomach, and hypogastric and gastrohepatic ligament adenopathy. 2. 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 3. Small amount of pelvic ascites.  - Called patient's daughter as above, left voicemail to review results. - Oncology consult placed for today; GI consult also placed for biopsy.   - 10/2: CEA, CA-19 pending. Daughter declined EGD for biopsy this AM. Per Oncology, may be able to perform liver biopsy for pathology; will follow up with Dr Lanny.   - 10/3: Liver biopsy performed today; pathology pending.  Appreciate Dr. 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  Plan to continue rehab and reassess functional gains closer to discharge.  10/6: Biopsy results pending, CEA high, CA 19.9 within normal limits.    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  Dr. Dr. Lanny to discuss results with  patient and her daughter today --will follow-up with them as outpatient in 2 weeks to discuss treatment options  12.  Constipation.  Last bowel movement recorded 9-12.  Has been eating very  little.  Will get KUB today.  10/3- Per pt's daughter, LBM was Wed/Th- and was soft- doesn't want me to give additional bowel meds  10/5- Still no BM- ate a t little more today- daughter doesn't want her  ot get more bowel meds  LBM 10/1-2 per daughter--not eating much  10-8: Add MiraLAX  daily and Colace 100 mg twice daily--patient refused, daughter states she is having daily bowel movements, DC'd.  13.  Chronic steroid use.  On prednisone 5 mg daily since admission.     LOS: 11 days A FACE TO FACE EVALUATION WAS PERFORMED  Joesph JAYSON Likes 08/11/2024, 10:43 AM

## 2024-08-14 ENCOUNTER — Telehealth: Payer: Self-pay | Admitting: Hematology

## 2024-08-14 DIAGNOSIS — D62 Acute posthemorrhagic anemia: Secondary | ICD-10-CM

## 2024-08-14 DIAGNOSIS — C787 Secondary malignant neoplasm of liver and intrahepatic bile duct: Secondary | ICD-10-CM | POA: Insufficient documentation

## 2024-08-14 NOTE — Telephone Encounter (Signed)
 I LVM with Carlena's scheduled follow up in the next 2 weeks with Dr. Lanny. I asked that she return my call to re-schedule if needed.

## 2024-08-15 ENCOUNTER — Other Ambulatory Visit: Payer: Self-pay

## 2024-08-15 DIAGNOSIS — C787 Secondary malignant neoplasm of liver and intrahepatic bile duct: Secondary | ICD-10-CM

## 2024-08-15 DIAGNOSIS — K3189 Other diseases of stomach and duodenum: Secondary | ICD-10-CM

## 2024-08-15 NOTE — Progress Notes (Signed)
 Verbal order w/readback order from Dr. Lanny for [facility] to be drawn on 08/29/2024.  Order placed in EPIC and in [facility] portal.  [facility] kt and requisition given to [facility] Lab Receptionist.

## 2024-08-17 ENCOUNTER — Other Ambulatory Visit: Payer: Self-pay

## 2024-08-19 ENCOUNTER — Ambulatory Visit (HOSPITAL_COMMUNITY): Admission: EM | Admit: 2024-08-19 | Discharge: 2024-08-19 | Disposition: A | Discharge status: Home / Self Care

## 2024-08-19 ENCOUNTER — Encounter (HOSPITAL_COMMUNITY): Payer: Self-pay

## 2024-08-19 ENCOUNTER — Other Ambulatory Visit: Payer: Self-pay

## 2024-08-19 ENCOUNTER — Inpatient Hospital Stay (HOSPITAL_COMMUNITY)
Admission: EM | Admit: 2024-08-19 | Discharge: 2024-08-24 | DRG: 299 | Disposition: A | Discharge status: Hospice | Source: Ambulatory Visit | Attending: Family Medicine | Admitting: Family Medicine

## 2024-08-19 ENCOUNTER — Emergency Department (HOSPITAL_COMMUNITY)

## 2024-08-19 DIAGNOSIS — I10 Essential (primary) hypertension: Secondary | ICD-10-CM | POA: Diagnosis present

## 2024-08-19 DIAGNOSIS — I82220 Acute embolism and thrombosis of inferior vena cava: Secondary | ICD-10-CM | POA: Diagnosis present

## 2024-08-19 DIAGNOSIS — Z91013 Allergy to seafood: Secondary | ICD-10-CM

## 2024-08-19 DIAGNOSIS — Z681 Body mass index (BMI) 19 or less, adult: Secondary | ICD-10-CM

## 2024-08-19 DIAGNOSIS — R188 Other ascites: Secondary | ICD-10-CM | POA: Diagnosis present

## 2024-08-19 DIAGNOSIS — I82409 Acute embolism and thrombosis of unspecified deep veins of unspecified lower extremity: Secondary | ICD-10-CM | POA: Diagnosis present

## 2024-08-19 DIAGNOSIS — C169 Malignant neoplasm of stomach, unspecified: Secondary | ICD-10-CM | POA: Diagnosis present

## 2024-08-19 DIAGNOSIS — Z86711 Personal history of pulmonary embolism: Secondary | ICD-10-CM

## 2024-08-19 DIAGNOSIS — C786 Secondary malignant neoplasm of retroperitoneum and peritoneum: Secondary | ICD-10-CM | POA: Diagnosis present

## 2024-08-19 DIAGNOSIS — R0602 Shortness of breath: Secondary | ICD-10-CM | POA: Diagnosis not present

## 2024-08-19 DIAGNOSIS — Z7401 Bed confinement status: Secondary | ICD-10-CM | POA: Diagnosis not present

## 2024-08-19 DIAGNOSIS — C482 Malignant neoplasm of peritoneum, unspecified: Secondary | ICD-10-CM | POA: Diagnosis not present

## 2024-08-19 DIAGNOSIS — Z95828 Presence of other vascular implants and grafts: Secondary | ICD-10-CM | POA: Diagnosis not present

## 2024-08-19 DIAGNOSIS — J9811 Atelectasis: Secondary | ICD-10-CM | POA: Diagnosis not present

## 2024-08-19 DIAGNOSIS — I69320 Aphasia following cerebral infarction: Secondary | ICD-10-CM | POA: Diagnosis not present

## 2024-08-19 DIAGNOSIS — K219 Gastro-esophageal reflux disease without esophagitis: Secondary | ICD-10-CM | POA: Diagnosis present

## 2024-08-19 DIAGNOSIS — Z515 Encounter for palliative care: Secondary | ICD-10-CM

## 2024-08-19 DIAGNOSIS — Z79899 Other long term (current) drug therapy: Secondary | ICD-10-CM

## 2024-08-19 DIAGNOSIS — D649 Anemia, unspecified: Secondary | ICD-10-CM | POA: Diagnosis not present

## 2024-08-19 DIAGNOSIS — D630 Anemia in neoplastic disease: Secondary | ICD-10-CM | POA: Diagnosis present

## 2024-08-19 DIAGNOSIS — I82413 Acute embolism and thrombosis of femoral vein, bilateral: Principal | ICD-10-CM | POA: Diagnosis present

## 2024-08-19 DIAGNOSIS — Z66 Do not resuscitate: Secondary | ICD-10-CM | POA: Diagnosis present

## 2024-08-19 DIAGNOSIS — Z803 Family history of malignant neoplasm of breast: Secondary | ICD-10-CM

## 2024-08-19 DIAGNOSIS — T380X5A Adverse effect of glucocorticoids and synthetic analogues, initial encounter: Secondary | ICD-10-CM | POA: Diagnosis present

## 2024-08-19 DIAGNOSIS — D72829 Elevated white blood cell count, unspecified: Secondary | ICD-10-CM | POA: Diagnosis present

## 2024-08-19 DIAGNOSIS — I2699 Other pulmonary embolism without acute cor pulmonale: Secondary | ICD-10-CM

## 2024-08-19 DIAGNOSIS — Z7901 Long term (current) use of anticoagulants: Secondary | ICD-10-CM

## 2024-08-19 DIAGNOSIS — M79662 Pain in left lower leg: Secondary | ICD-10-CM | POA: Diagnosis not present

## 2024-08-19 DIAGNOSIS — E43 Unspecified severe protein-calorie malnutrition: Secondary | ICD-10-CM | POA: Diagnosis present

## 2024-08-19 DIAGNOSIS — Z882 Allergy status to sulfonamides status: Secondary | ICD-10-CM | POA: Diagnosis not present

## 2024-08-19 DIAGNOSIS — E785 Hyperlipidemia, unspecified: Secondary | ICD-10-CM | POA: Diagnosis present

## 2024-08-19 DIAGNOSIS — D75839 Thrombocytosis, unspecified: Secondary | ICD-10-CM | POA: Diagnosis present

## 2024-08-19 DIAGNOSIS — E876 Hypokalemia: Secondary | ICD-10-CM | POA: Diagnosis present

## 2024-08-19 DIAGNOSIS — M7989 Other specified soft tissue disorders: Secondary | ICD-10-CM | POA: Diagnosis not present

## 2024-08-19 DIAGNOSIS — R509 Fever, unspecified: Secondary | ICD-10-CM | POA: Diagnosis not present

## 2024-08-19 DIAGNOSIS — I824Y9 Acute embolism and thrombosis of unspecified deep veins of unspecified proximal lower extremity: Secondary | ICD-10-CM | POA: Diagnosis not present

## 2024-08-19 DIAGNOSIS — C787 Secondary malignant neoplasm of liver and intrahepatic bile duct: Secondary | ICD-10-CM | POA: Diagnosis present

## 2024-08-19 DIAGNOSIS — M79605 Pain in left leg: Secondary | ICD-10-CM | POA: Diagnosis not present

## 2024-08-19 DIAGNOSIS — R4701 Aphasia: Secondary | ICD-10-CM | POA: Diagnosis present

## 2024-08-19 DIAGNOSIS — R160 Hepatomegaly, not elsewhere classified: Secondary | ICD-10-CM | POA: Diagnosis not present

## 2024-08-19 DIAGNOSIS — C799 Secondary malignant neoplasm of unspecified site: Secondary | ICD-10-CM | POA: Diagnosis not present

## 2024-08-19 DIAGNOSIS — N281 Cyst of kidney, acquired: Secondary | ICD-10-CM | POA: Diagnosis not present

## 2024-08-19 DIAGNOSIS — I471 Supraventricular tachycardia, unspecified: Secondary | ICD-10-CM | POA: Diagnosis present

## 2024-08-19 DIAGNOSIS — Z7952 Long term (current) use of systemic steroids: Secondary | ICD-10-CM

## 2024-08-19 DIAGNOSIS — R531 Weakness: Secondary | ICD-10-CM | POA: Diagnosis not present

## 2024-08-19 HISTORY — DX: Acute embolism and thrombosis of right femoral vein: I82.411

## 2024-08-19 HISTORY — DX: Malignant (primary) neoplasm, unspecified: C80.1

## 2024-08-19 HISTORY — DX: Cerebral infarction, unspecified: I63.9

## 2024-08-19 LAB — CBC
HCT: 31.7 % — ABNORMAL LOW (ref 36.0–46.0)
Hemoglobin: 9.7 g/dL — ABNORMAL LOW (ref 12.0–15.0)
MCH: 26.2 pg (ref 26.0–34.0)
MCHC: 30.6 g/dL (ref 30.0–36.0)
MCV: 85.7 fL (ref 80.0–100.0)
Platelets: 412 K/uL — ABNORMAL HIGH (ref 150–400)
RBC: 3.7 MIL/uL — ABNORMAL LOW (ref 3.87–5.11)
RDW: 19.4 % — ABNORMAL HIGH (ref 11.5–15.5)
WBC: 20.3 K/uL — ABNORMAL HIGH (ref 4.0–10.5)
nRBC: 0 % (ref 0.0–0.2)

## 2024-08-19 LAB — COMPREHENSIVE METABOLIC PANEL WITH GFR
ALT: 22 U/L (ref 0–44)
AST: 30 U/L (ref 15–41)
Albumin: 2.2 g/dL — ABNORMAL LOW (ref 3.5–5.0)
Alkaline Phosphatase: 286 U/L — ABNORMAL HIGH (ref 38–126)
Anion gap: 15 (ref 5–15)
BUN: 26 mg/dL — ABNORMAL HIGH (ref 8–23)
CO2: 19 mmol/L — ABNORMAL LOW (ref 22–32)
Calcium: 8.8 mg/dL — ABNORMAL LOW (ref 8.9–10.3)
Chloride: 105 mmol/L (ref 98–111)
Creatinine, Ser: 1.01 mg/dL — ABNORMAL HIGH (ref 0.44–1.00)
GFR, Estimated: 56 mL/min — ABNORMAL LOW (ref >60)
Glucose, Bld: 136 mg/dL — ABNORMAL HIGH (ref 70–99)
Potassium: 3.6 mmol/L (ref 3.5–5.1)
Sodium: 139 mmol/L (ref 135–145)
Total Bilirubin: 0.8 mg/dL (ref 0.0–1.2)
Total Protein: 7.1 g/dL (ref 6.5–8.1)

## 2024-08-19 LAB — LACTIC ACID, PLASMA: Lactic Acid, Venous: 1.2 mmol/L (ref 0.5–1.9)

## 2024-08-19 NOTE — ED Provider Triage Note (Signed)
 Emergency Medicine Provider Triage Evaluation Note  Morrigan Wickens , a 83 y.o. female  was evaluated in triage.  Pt complains of left leg swelling, pain.  History of DVT, PE, currently taking Lovenox  and with IVC filter in place.  Recent stroke, expressive aphasia.  Recent diagnosis of stomach cancer with metastasis.  Denies any chest pain or shortness of breath at this time..  Review of Systems  Positive: Leg swelling, pain Negative:   Physical Exam  BP 118/79 (BP Location: Left Arm)   Pulse (!) 133   Temp 99 F (37.2 C)   Resp (!) 23   SpO2 97%  Gen:   Awake, no distress   Resp:  Normal effort  MSK:   Moves extremities without difficulty  Other:  2+ edema of LE, no pitting  Medical Decision Making  Medically screening exam initiated at 4:04 PM.  Appropriate orders placed.  [name] was informed that the remainder of the evaluation will be completed by another provider, this initial triage assessment does not replace that evaluation, and the importance of remaining in the ED until their evaluation is complete.  Workup initiated in triage    Rosan Sherlean DEL, [place] 08/19/24 1607

## 2024-08-19 NOTE — ED Provider Notes (Signed)
 [facility]    CSN: [id number] Arrival date & time: 08/19/24  1314      History   Chief Complaint Chief Complaint  Patient presents with   Leg Swelling    HPI Tiffany [name] is a 83 y.o. female.   83 year old female who is brought to urgent care by her family secondary to left lower extremity pain and swelling.  This started acutely in the last 2 to 3 days and has worsened.  She is unable to bear any weight on it.  She was just admitted to the hospital secondary to an ischemic stroke causing expressive aphasia.  She was diagnosed with a pulmonary embolism from a deep vein thrombosis in the right leg and was started on Lovenox  and required an IVC filter.  She did not have any problems with the left leg while she was in the hospital.  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      Home Medications    Prior to Admission medications   Medication Sig Start Date End Date Taking? Authorizing Provider  acetaminophen  (TYLENOL ) 500 MG tablet Take 2 tablets (1,000 mg total)  by mouth 3 (three) times daily. 08/10/24   Love, Sharlet RAMAN, PA-C  ascorbic acid (VITAMIN C) 500 MG tablet Take 1 tablet (500 mg total) by mouth daily. 08/10/24   Love, Sharlet RAMAN, PA-C  atorvastatin (LIPITOR) 20 MG tablet Take 1 tablet (20 mg total) by mouth daily. 08/10/24 09/10/24  Love, Sharlet RAMAN, PA-C  dorzolamide (TRUSOPT) 2 % ophthalmic solution Place 1 drop into both eyes 3 (three) times daily. 06/24/24   [provider]  enoxaparin  (LOVENOX ) 40 MG/0.4ML injection Inject 0.4 mLs (40 mg total) into the skin daily. 08/11/24   Love, Sharlet RAMAN, PA-C  ferrous sulfate 325 (65 FE) MG tablet Take 1 tablet (325 mg total) by mouth daily with breakfast. 08/10/24   Love, Sharlet RAMAN, PA-C  LUMIGAN 0.01 % SOLN Place 1 drop into both eyes at bedtime. 07/14/24   [provider]  metoprolol tartrate (LOPRESSOR) 25 MG tablet Take 1 tablet (25 mg total) by mouth daily. 08/10/24   Love, Sharlet RAMAN, PA-C  mirtazapine (REMERON) 15 MG tablet Take 1 tablet (15 mg total) by mouth at bedtime. 08/10/24   Love, Sharlet RAMAN, PA-C  pantoprazole (PROTONIX) 40 MG tablet Take 1 tablet (40 mg total) by mouth 2 (two) times daily. 08/10/24   Love, Sharlet RAMAN, PA-C  PREBIOTIC  PRODUCT PO Take 1 capsule by mouth daily.    [provider]  predniSONE (DELTASONE) 5 MG tablet Take 1 tablet (5 mg total) by mouth daily. 08/10/24   Love, Sharlet RAMAN, PA-C  Probiotic Product (PROBIOTIC PO) Take 1 capsule by mouth daily.    [provider]    Family History Family History  Problem Relation Age of Onset   Breast cancer Sister     Social History Social History   Tobacco Use   Smoking status: Never    Passive exposure: Never   Smokeless tobacco: Never  Vaping Use   Vaping status: Never Used  Substance Use Topics   Alcohol use: Not Currently   Drug use: Never     Allergies   Elemental sulfur, Shellfish allergy, Shellfish allergy, and Sulfa antibiotics   Review of Systems Review of Systems  Constitutional:   Negative for chills and fever.  HENT:  Negative for ear pain and sore throat.   Eyes:  Negative for pain and visual disturbance.  Respiratory:  Negative for cough and shortness of breath.   Cardiovascular:  Positive for leg swelling (Left leg with tenderness). Negative for chest pain and palpitations.  Gastrointestinal:  Negative for abdominal pain and vomiting.  Genitourinary:  Negative for dysuria and hematuria.  Musculoskeletal:  Negative for arthralgias and back pain.  Skin:  Negative for color change and rash.  Neurological:  Negative for seizures and syncope.  Psychiatric/Behavioral:         Patient is aphasic, family denies any significant changes  All other systems reviewed and are negative.    Physical Exam Triage Vital Signs ED Triage Vitals [08/19/24 1423]  Encounter Vitals Group     BP 111/66     Girls Systolic BP Percentile      Girls Diastolic BP Percentile      Boys Systolic BP Percentile      Boys Diastolic BP Percentile      Pulse Rate (!) 135     Resp 16     Temp 100 F (37.8 C)     Temp Source Oral     SpO2 97 %     Weight      Height      Head Circumference      Peak Flow      Pain Score      Pain Loc      Pain Education      Exclude from Growth Chart    No data found.  Updated Vital Signs BP 111/66 (BP Location: Left Arm)   Pulse (!) 135   Temp 100 F (37.8 C) (Oral)   Resp 16   SpO2 97%   Visual Acuity Right Eye Distance:   Left Eye Distance:   Bilateral Distance:    Right Eye Near:   Left Eye Near:    Bilateral Near:     Physical Exam Vitals and nursing note reviewed.  Constitutional:      General: She is not in acute distress.    Appearance: She is well-developed.     Comments: Patient is aphasic  HENT:     Head: Normocephalic and atraumatic.  Eyes:     Conjunctiva/sclera: Conjunctivae normal.  Cardiovascular:     Rate and Rhythm: Normal rate and regular rhythm.     Heart sounds: No murmur heard. Pulmonary:     Effort:  Pulmonary effort is normal. No respiratory distress.     Breath sounds: Normal breath sounds.  Musculoskeletal:  General: No swelling.     Cervical back: Neck supple.     Left lower leg: Edema (Left lower extremity is obviously larger than the right extending from the thigh to the foot, calf is very tender) present.  Skin:    General: Skin is warm and dry.     Capillary Refill: Capillary refill takes less than 2 seconds.  Neurological:     Mental Status: She is alert.  Psychiatric:        Mood and Affect: Mood normal.      UC Treatments / Results  Labs (all labs ordered are listed, but only abnormal results are displayed) Labs Reviewed - No data to display  EKG   Radiology No results found.  Procedures Procedures (including critical care time)  Medications Ordered in UC Medications - No data to display  Initial Impression / Assessment and Plan / UC Course  I have reviewed the triage vital signs and the nursing notes.  Pertinent labs & imaging results that were available during my care of the patient were reviewed by me and considered in my medical decision making (see chart for details).     Pain and swelling of left lower leg  Acute pulmonary embolism, unspecified pulmonary embolism type, unspecified whether acute cor pulmonale present (HCC)  S/P IVC filter  Anticoagulated   Acute onset of pain and swelling in the left lower extremity that is concerning for a deep vein thrombosis.  Given the recent history of recent pulmonary embolism, acute stroke, IVC filter placement and being on anticoagulation, we recommend further evaluation at the emergency room where more advanced workup can be obtained.  Final Clinical Impressions(s) / UC Diagnoses   Final diagnoses:  Pain and swelling of left lower leg  Acute pulmonary embolism, unspecified pulmonary embolism type, unspecified whether acute cor pulmonale present (HCC)  S/P IVC filter  Anticoagulated      Discharge Instructions      Acute onset of pain and swelling in the left lower extremity that is concerning for a deep vein thrombosis.  Given the recent history of pulmonary embolism, acute stroke, IVC filter placement and being on anticoagulation, we recommend further evaluation at the emergency room where more advanced workup can be obtained.     ED Prescriptions   None    PDMP not reviewed this encounter.   Teresa Almarie LABOR, [place] 08/19/24 1443

## 2024-08-19 NOTE — Discharge Instructions (Addendum)
 Acute onset of pain and swelling in the left lower extremity that is concerning for a deep vein thrombosis.  Given the recent history of pulmonary embolism, acute stroke, IVC filter placement and being on anticoagulation, we recommend further evaluation at the emergency room where more advanced workup can be obtained.

## 2024-08-19 NOTE — ED Triage Notes (Signed)
 C/o left leg pain onset several days ago with swelling was seen at [facility] and sent to the ED for further eval. Familyt states she was recently dc. With stomach cancer

## 2024-08-19 NOTE — ED Notes (Signed)
 Patient is being discharged from the Urgent Care and sent to the Emergency Department via POV . Per Almarie Pizza, NP, patient is in need of higher level of care due to limited resources, potential blood clott. Patient is aware and verbalizes understanding of plan of care.  Vitals:   08/19/24 1423  BP: 111/66  Pulse: (!) 135  Resp: 16  Temp: 100 F (37.8 C)  SpO2: 97%

## 2024-08-19 NOTE — ED Triage Notes (Addendum)
 Patient's daughter reports that the patient was diagnosed with a stroke a week ago.  Patient has left leg swelling and pain x 2-3 days. Patient unable to bear weight. Patient hs expressive aphasia.

## 2024-08-20 ENCOUNTER — Emergency Department (HOSPITAL_COMMUNITY)

## 2024-08-20 ENCOUNTER — Inpatient Hospital Stay (HOSPITAL_COMMUNITY)

## 2024-08-20 DIAGNOSIS — Z515 Encounter for palliative care: Secondary | ICD-10-CM | POA: Diagnosis not present

## 2024-08-20 DIAGNOSIS — I82413 Acute embolism and thrombosis of femoral vein, bilateral: Secondary | ICD-10-CM | POA: Diagnosis present

## 2024-08-20 DIAGNOSIS — I2699 Other pulmonary embolism without acute cor pulmonale: Secondary | ICD-10-CM | POA: Diagnosis not present

## 2024-08-20 DIAGNOSIS — I824Y9 Acute embolism and thrombosis of unspecified deep veins of unspecified proximal lower extremity: Secondary | ICD-10-CM | POA: Diagnosis not present

## 2024-08-20 DIAGNOSIS — C786 Secondary malignant neoplasm of retroperitoneum and peritoneum: Secondary | ICD-10-CM | POA: Diagnosis present

## 2024-08-20 DIAGNOSIS — D649 Anemia, unspecified: Secondary | ICD-10-CM

## 2024-08-20 DIAGNOSIS — I82220 Acute embolism and thrombosis of inferior vena cava: Secondary | ICD-10-CM | POA: Diagnosis present

## 2024-08-20 DIAGNOSIS — M79605 Pain in left leg: Secondary | ICD-10-CM | POA: Diagnosis not present

## 2024-08-20 DIAGNOSIS — Z681 Body mass index (BMI) 19 or less, adult: Secondary | ICD-10-CM | POA: Diagnosis not present

## 2024-08-20 DIAGNOSIS — I82409 Acute embolism and thrombosis of unspecified deep veins of unspecified lower extremity: Secondary | ICD-10-CM | POA: Diagnosis present

## 2024-08-20 DIAGNOSIS — Z66 Do not resuscitate: Secondary | ICD-10-CM | POA: Diagnosis present

## 2024-08-20 DIAGNOSIS — Z95828 Presence of other vascular implants and grafts: Secondary | ICD-10-CM | POA: Diagnosis not present

## 2024-08-20 DIAGNOSIS — D72829 Elevated white blood cell count, unspecified: Secondary | ICD-10-CM | POA: Diagnosis present

## 2024-08-20 DIAGNOSIS — T380X5A Adverse effect of glucocorticoids and synthetic analogues, initial encounter: Secondary | ICD-10-CM | POA: Diagnosis present

## 2024-08-20 DIAGNOSIS — C787 Secondary malignant neoplasm of liver and intrahepatic bile duct: Secondary | ICD-10-CM | POA: Diagnosis present

## 2024-08-20 DIAGNOSIS — Z91013 Allergy to seafood: Secondary | ICD-10-CM | POA: Diagnosis not present

## 2024-08-20 DIAGNOSIS — C799 Secondary malignant neoplasm of unspecified site: Secondary | ICD-10-CM

## 2024-08-20 DIAGNOSIS — N281 Cyst of kidney, acquired: Secondary | ICD-10-CM | POA: Diagnosis not present

## 2024-08-20 DIAGNOSIS — Z882 Allergy status to sulfonamides status: Secondary | ICD-10-CM | POA: Diagnosis not present

## 2024-08-20 DIAGNOSIS — R4701 Aphasia: Secondary | ICD-10-CM | POA: Diagnosis present

## 2024-08-20 DIAGNOSIS — E43 Unspecified severe protein-calorie malnutrition: Secondary | ICD-10-CM | POA: Diagnosis present

## 2024-08-20 DIAGNOSIS — I471 Supraventricular tachycardia, unspecified: Secondary | ICD-10-CM | POA: Diagnosis present

## 2024-08-20 DIAGNOSIS — Z7901 Long term (current) use of anticoagulants: Secondary | ICD-10-CM | POA: Diagnosis not present

## 2024-08-20 DIAGNOSIS — E876 Hypokalemia: Secondary | ICD-10-CM | POA: Diagnosis present

## 2024-08-20 DIAGNOSIS — E785 Hyperlipidemia, unspecified: Secondary | ICD-10-CM | POA: Diagnosis present

## 2024-08-20 DIAGNOSIS — K219 Gastro-esophageal reflux disease without esophagitis: Secondary | ICD-10-CM | POA: Diagnosis present

## 2024-08-20 DIAGNOSIS — I69320 Aphasia following cerebral infarction: Secondary | ICD-10-CM | POA: Diagnosis not present

## 2024-08-20 DIAGNOSIS — R160 Hepatomegaly, not elsewhere classified: Secondary | ICD-10-CM | POA: Diagnosis not present

## 2024-08-20 DIAGNOSIS — C169 Malignant neoplasm of stomach, unspecified: Secondary | ICD-10-CM | POA: Diagnosis present

## 2024-08-20 DIAGNOSIS — I10 Essential (primary) hypertension: Secondary | ICD-10-CM | POA: Diagnosis present

## 2024-08-20 DIAGNOSIS — C482 Malignant neoplasm of peritoneum, unspecified: Secondary | ICD-10-CM | POA: Diagnosis not present

## 2024-08-20 DIAGNOSIS — R188 Other ascites: Secondary | ICD-10-CM | POA: Diagnosis present

## 2024-08-20 DIAGNOSIS — D630 Anemia in neoplastic disease: Secondary | ICD-10-CM | POA: Diagnosis present

## 2024-08-20 LAB — URINALYSIS, W/ REFLEX TO CULTURE (INFECTION SUSPECTED)
Bilirubin Urine: NEGATIVE
Glucose, UA: NEGATIVE mg/dL
Hgb urine dipstick: NEGATIVE
Ketones, ur: 5 mg/dL — AB
Leukocytes,Ua: NEGATIVE
Nitrite: NEGATIVE
Protein, ur: 100 mg/dL — AB
Specific Gravity, Urine: 1.039 — ABNORMAL HIGH (ref 1.005–1.030)
pH: 5 (ref 5.0–8.0)

## 2024-08-20 LAB — CBC WITH DIFFERENTIAL/PLATELET
Abs Immature Granulocytes: 0.17 K/uL — ABNORMAL HIGH (ref 0.00–0.07)
Basophils Absolute: 0.1 K/uL (ref 0.0–0.1)
Basophils Relative: 1 %
Eosinophils Absolute: 0.1 K/uL (ref 0.0–0.5)
Eosinophils Relative: 1 %
HCT: 31.6 % — ABNORMAL LOW (ref 36.0–46.0)
Hemoglobin: 9.7 g/dL — ABNORMAL LOW (ref 12.0–15.0)
Immature Granulocytes: 1 %
Lymphocytes Relative: 17 %
Lymphs Abs: 2.5 K/uL (ref 0.7–4.0)
MCH: 26.1 pg (ref 26.0–34.0)
MCHC: 30.7 g/dL (ref 30.0–36.0)
MCV: 84.9 fL (ref 80.0–100.0)
Monocytes Absolute: 1.8 K/uL — ABNORMAL HIGH (ref 0.1–1.0)
Monocytes Relative: 12 %
Neutro Abs: 9.9 K/uL — ABNORMAL HIGH (ref 1.7–7.7)
Neutrophils Relative %: 68 %
Platelets: 494 K/uL — ABNORMAL HIGH (ref 150–400)
RBC: 3.72 MIL/uL — ABNORMAL LOW (ref 3.87–5.11)
RDW: 19.5 % — ABNORMAL HIGH (ref 11.5–15.5)
WBC: 14.5 K/uL — ABNORMAL HIGH (ref 4.0–10.5)
nRBC: 0 % (ref 0.0–0.2)

## 2024-08-20 LAB — BASIC METABOLIC PANEL WITH GFR
Anion gap: 14 (ref 5–15)
BUN: 22 mg/dL (ref 8–23)
CO2: 21 mmol/L — ABNORMAL LOW (ref 22–32)
Calcium: 8.6 mg/dL — ABNORMAL LOW (ref 8.9–10.3)
Chloride: 105 mmol/L (ref 98–111)
Creatinine, Ser: 0.86 mg/dL (ref 0.44–1.00)
GFR, Estimated: >60 mL/min (ref >60)
Glucose, Bld: 85 mg/dL (ref 70–99)
Potassium: 3.4 mmol/L — ABNORMAL LOW (ref 3.5–5.1)
Sodium: 140 mmol/L (ref 135–145)

## 2024-08-20 LAB — HEPARIN LEVEL (UNFRACTIONATED): Heparin Unfractionated: 0.15 [IU]/mL — ABNORMAL LOW (ref 0.30–0.70)

## 2024-08-20 MED ORDER — HEPARIN (PORCINE) 25000 UT/250ML-% IV SOLN
950.0000 [IU]/h | INTRAVENOUS | Status: DC
  Administered 2024-08-20: 700 [IU]/h via INTRAVENOUS
  Administered 2024-08-21: 950 [IU]/h via INTRAVENOUS
  Filled 2024-08-20 (×2): qty 250

## 2024-08-20 MED ORDER — PREDNISONE 5 MG PO TABS
2.5000 mg | ORAL_TABLET | Freq: Every day | ORAL | Status: DC
  Administered 2024-08-20 – 2024-08-24 (×5): 2.5 mg via ORAL
  Filled 2024-08-20 (×5): qty 1

## 2024-08-20 MED ORDER — LATANOPROST 0.005 % OP SOLN
1.0000 [drp] | Freq: Every day | OPHTHALMIC | Status: DC
  Administered 2024-08-20 – 2024-08-22 (×3): 1 [drp] via OPHTHALMIC
  Filled 2024-08-20: qty 2.5

## 2024-08-20 MED ORDER — METOPROLOL TARTRATE 25 MG PO TABS
25.0000 mg | ORAL_TABLET | Freq: Every day | ORAL | Status: DC
  Administered 2024-08-20 – 2024-08-24 (×5): 25 mg via ORAL
  Filled 2024-08-20 (×5): qty 1

## 2024-08-20 MED ORDER — DORZOLAMIDE HCL 2 % OP SOLN
1.0000 [drp] | Freq: Two times a day (BID) | OPHTHALMIC | Status: DC
  Administered 2024-08-20 – 2024-08-24 (×7): 1 [drp] via OPHTHALMIC
  Filled 2024-08-20: qty 10

## 2024-08-20 MED ORDER — VITAMIN C 500 MG PO TABS
500.0000 mg | ORAL_TABLET | Freq: Every day | ORAL | Status: DC
  Administered 2024-08-20 – 2024-08-24 (×5): 500 mg via ORAL
  Filled 2024-08-20 (×5): qty 1

## 2024-08-20 MED ORDER — MIRTAZAPINE 15 MG PO TABS
15.0000 mg | ORAL_TABLET | Freq: Every day | ORAL | Status: DC
  Administered 2024-08-20 – 2024-08-22 (×3): 15 mg via ORAL
  Filled 2024-08-20 (×3): qty 1

## 2024-08-20 MED ORDER — IOHEXOL 350 MG/ML SOLN
75.0000 mL | Freq: Once | INTRAVENOUS | Status: AC | PRN
  Administered 2024-08-20: 75 mL via INTRAVENOUS

## 2024-08-20 MED ORDER — FERROUS SULFATE 325 (65 FE) MG PO TABS
325.0000 mg | ORAL_TABLET | Freq: Every day | ORAL | Status: DC
  Administered 2024-08-21 – 2024-08-24 (×4): 325 mg via ORAL
  Filled 2024-08-20 (×4): qty 1

## 2024-08-20 MED ORDER — ACETAMINOPHEN 500 MG PO TABS
500.0000 mg | ORAL_TABLET | Freq: Two times a day (BID) | ORAL | Status: DC
  Administered 2024-08-20 – 2024-08-24 (×7): 500 mg via ORAL
  Filled 2024-08-20 (×7): qty 1

## 2024-08-20 MED ORDER — PANTOPRAZOLE SODIUM 40 MG PO TBEC
40.0000 mg | DELAYED_RELEASE_TABLET | Freq: Two times a day (BID) | ORAL | Status: DC
  Administered 2024-08-20 – 2024-08-24 (×7): 40 mg via ORAL
  Filled 2024-08-20 (×7): qty 1

## 2024-08-20 MED ORDER — ATORVASTATIN CALCIUM 10 MG PO TABS
20.0000 mg | ORAL_TABLET | Freq: Every day | ORAL | Status: DC
  Administered 2024-08-20 – 2024-08-24 (×5): 20 mg via ORAL
  Filled 2024-08-20 (×5): qty 2

## 2024-08-20 NOTE — Consult Note (Addendum)
 Hospital Consult    Reason for Consult: DVT Requesting Physician: ED MRN #:  [id number]  History of Present Illness: Tiffany [name] is a 83 y.o. female who presents to the ED with left lower extremity pain and swelling.  She was recently discharged from the hospital on 08/11/2024 after being treated for acute right lower extremity DVT, right PE, large acute MCA infarct with aphasia, and metastatic gastric cancer.  She was felt to be at high risk for bleeding with DOAC, so she was discharged on subcutaneous Lovenox  for anticoagulation.  She also had an IVC filter placed on 08/04/2024.  Upon presentation to the ED yesterday, CT abdomen/pelvis was obtained.  This demonstrated extensive thrombus throughout the IVC extending into bilateral common iliac and femoral veins, with left iliac and femoral venous fat stranding suspicious for thrombophlebitis.  We were consulted for management of the patient's thrombus.  There is no family at the bedside this morning.  The patient is aphasic and history is difficult to obtain.  Per ED provider notes, the patient has had several days of left leg swelling and pain.  It has been difficult for her to put pressure on her leg.  Past Medical History:  Diagnosis Date   Cancer [facility])    DVT of deep femoral vein, right (HCC)    Hypertension    Stroke [facility])     Past Surgical History:  Procedure Laterality Date   IR IVC FILTER PLMT / S&I /IMG GUID/MOD SED  08/04/2024   IR US  LIVER BIOPSY  08/04/2024    Allergies  Allergen Reactions   Elemental Sulfur Hives   Shellfish Allergy Anaphylaxis   Shellfish Allergy    Sulfa Antibiotics Hives    Prior to Admission medications   Medication Sig Start Date End Date Taking? Authorizing Provider  acetaminophen  (TYLENOL ) 500 MG tablet Take 2 tablets (1,000 mg total) by mouth 3 (three) times daily. Patient taking differently: Take 500 mg by mouth 2 (two) times daily. 08/10/24  Yes Love, Sharlet RAMAN, PA-C  ascorbic acid  (VITAMIN C) 500 MG tablet Take 1 tablet (500 mg total) by mouth daily. 08/10/24  Yes Love, Sharlet RAMAN, PA-C  atorvastatin (LIPITOR) 20 MG tablet Take 1 tablet (20 mg total) by mouth daily. 08/10/24 09/10/24 Yes Love, Sharlet RAMAN, PA-C  dorzolamide (TRUSOPT) 2 % ophthalmic solution Place 1 drop into both eyes 2 (two) times daily. 06/24/24  Yes [provider]  enoxaparin  (LOVENOX ) 40 MG/0.4ML injection Inject 0.4 mLs (40 mg total) into the skin daily. 08/11/24  Yes Love, Sharlet RAMAN, PA-C  ferrous sulfate 325 (65 FE) MG tablet Take 1 tablet (325 mg total) by mouth daily with breakfast. 08/10/24  Yes Love, Pamela S, PA-C  LUMIGAN 0.01 % SOLN Place 1 drop into both eyes at bedtime. 07/14/24  Yes [provider]  metoprolol tartrate (LOPRESSOR) 25 MG tablet Take 1 tablet (25 mg total) by mouth daily. 08/10/24  Yes Love, Sharlet RAMAN, PA-C  mirtazapine (REMERON) 15 MG tablet Take 1 tablet (15 mg total) by mouth at bedtime. 08/10/24  Yes Love, Sharlet RAMAN, PA-C  pantoprazole (PROTONIX) 40 MG tablet Take 1 tablet (40 mg total) by mouth 2 (two) times daily. 08/10/24  Yes Love, Sharlet RAMAN, PA-C  PREBIOTIC PRODUCT PO Take 1 capsule by mouth daily.   Yes [provider]  predniSONE (DELTASONE) 5 MG tablet Take 1 tablet (5 mg total) by mouth daily. Patient taking differently: Take 2.5 mg by mouth daily. 08/10/24  Yes Love, Sharlet  S, PA-C  Probiotic Product (PROBIOTIC PO) Take 1 capsule by mouth daily.   Yes [provider]  metoprolol tartrate (LOPRESSOR) 50 MG tablet Take 50 mg by mouth daily. Patient not taking: Reported on 08/20/2024 08/17/24   [provider]    Social History   Socioeconomic History   Marital status: Divorced    Spouse name: Not on file   Number of children: Not on file   Years of education: Not on file   Highest education level: Not on file  Occupational History   Not on file  Tobacco Use   Smoking status: Never    Passive exposure: Never   Smokeless  tobacco: Never  Vaping Use   Vaping status: Never Used  Substance and Sexual Activity   Alcohol use: Not Currently   Drug use: Never   Sexual activity: Not on file  Other Topics Concern   Not on file  Social History Narrative   ** Merged History Encounter **       Social Drivers of Health   Financial Resource Strain: Not on file  Food Insecurity: Unknown (10/18/2023)   Hunger Vital Sign    Worried About Running Out of Food in the Last Year: Never true    Ran Out of Food in the Last Year: Patient declined  Transportation Needs: Patient Declined (10/18/2023)   PRAPARE - Administrator, Civil Service (Medical): Patient declined    Lack of Transportation (Non-Medical): Patient declined  Physical Activity: Not on file  Stress: Not on file  Social Connections: Patient Unable To Answer (07/23/2024)   Social Connection and Isolation Panel    Frequency of Communication with Friends and Family: Patient unable to answer    Frequency of Social Gatherings with Friends and Family: Patient unable to answer    Attends Religious Services: Patient unable to answer    Active Member of Clubs or Organizations: Patient unable to answer    Attends Banker Meetings: Patient unable to answer    Marital Status: Patient unable to answer  Intimate Partner Violence: Patient Declined (10/18/2023)   Humiliation, Afraid, Rape, and Kick questionnaire    Fear of Current or Ex-Partner: Patient declined    Emotionally Abused: Patient declined    Physically Abused: Patient declined    Sexually Abused: Patient declined    Family History  Problem Relation Age of Onset   Breast cancer Sister     ROS: Otherwise negative unless mentioned in HPI  Physical Examination  Vitals:   08/20/24 0837 08/20/24 0900  BP: 124/67 123/71  Pulse: (!) 119 (!) 120  Resp: 14 (!) 22  Temp:    SpO2: 100% 100%   Body mass index is 19.97 kg/m.  General: Chronically ill-appearing, no acute  distress Gait: Not observed HENT: WNL, normocephalic Pulmonary: normal non-labored breathing Cardiac: Tachycardia Abdomen:  soft, nondistended Skin: without rashes Vascular Exam/Pulses: Easily palpable DP pulses Extremities: Edema of BLE, left greater than right Musculoskeletal: no muscle wasting or atrophy  Neurologic: Alert and oriented to self, unable to obtain further orientation due to aphasia Psychiatric:  The pt has Normal affect. Lymph:  Unremarkable  CBC    Component Value Date/Time   WBC 20.3 (H) 08/19/2024 1606   RBC 3.70 (L) 08/19/2024 1606   HGB 9.7 (L) 08/19/2024 1606   HCT 31.7 (L) 08/19/2024 1606   PLT 412 (H) 08/19/2024 1606   MCV 85.7 08/19/2024 1606   MCH 26.2 08/19/2024 1606   MCHC 30.6  08/19/2024 1606   RDW 19.4 (H) 08/19/2024 1606   LYMPHSABS 2.5 08/01/2024 0527   MONOABS 1.9 (H) 08/01/2024 0527   EOSABS 0.2 08/01/2024 0527   BASOSABS 0.1 08/01/2024 0527    BMET    Component Value Date/Time   NA 139 08/19/2024 1606   K 3.6 08/19/2024 1606   CL 105 08/19/2024 1606   CO2 19 (L) 08/19/2024 1606   GLUCOSE 136 (H) 08/19/2024 1606   BUN 26 (H) 08/19/2024 1606   CREATININE 1.01 (H) 08/19/2024 1606   CALCIUM 8.8 (L) 08/19/2024 1606   GFRNONAA 56 (L) 08/19/2024 1606    COAGS: Lab Results  Component Value Date   INR 1.3 (H) 08/04/2024   INR 1.3 (H) 07/24/2024     Non-Invasive Vascular Imaging:   CT abdomen/pelvis (08/20/2024) - Extensive thrombus throughout the IVC extending into bilateral common iliac and femoral veins, with left iliac and femoral venous fat stranding suspicious for thrombophlebitis - IVC filter in place   ASSESSMENT/PLAN: This is a 83 y.o. female who presents to the ED with left lower extremity swelling and pain  -The patient was recently discharged from [facility] about a week ago after being treated for acute MCA infarct with aphasia, new diagnosis of metastatic gastric cancer, acute PE, and acute right lower extremity  DVT -Upon her previous discharge she was placed on Lovenox  for anticoagulation.  She was felt to be a high risk for bleeding on DOAC or antiplatelet therapy, per heme-onc -She presents to the ED with several days of left lower extremity swelling and pain.  Family has noted that it has been difficult for her to place pressure on the left leg due to pain.  Left lower extremity DVT study is pending -On exam she has easily palpable DP pulses.  She does have edema of bilateral legs, left greater than right.  - The patient is currently on heparin at 700 units/h.  She was previously on Lovenox  for her right lower extremity DVT.  We have briefly discussed with the patient that she is not a good candidate for minimally invasive lytic therapy to reduce her clot burden, given that she has a high bleed risk after recent stroke.  Thankfully her IVC filter has prevented thrombus from traveling to the lungs.  Given her medical state with recent stroke and metastatic cancer, her DVT would be best treated with continued anticoagulation therapy.    Ahmed Holster PA-C [facility] [phone]   I have seen and evaluated the patient. I agree with the PA note as documented above.  Vascular surgery was consulted in the ED due to lower extremity pain with evidence of bilateral DVTs extending into the cava to the level of the IVC filter.  She was just discharged on 08/11/2024 with large left MCA territory infarct with bilateral cerebellar infarcts also found to have severe anemia.  She just underwent IVC filter placement on 08/04/2024 by IR.  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  On my evaluation in the ED she is resting comfortably.  She has palpable DP pulses.  Hard to determine how much pain she is having is in her legs as she is not a very good historian.  Has some swelling but it is not overly profound.  Would recommend ongoing  anticoagulation.  Her IVC thrombosis up to the filter would best be treated with thrombolysis but she is not a candidate given recent large MCA territory infarct as well  as metastatic adenocarcinoma that would put her at exceedingly high risk for complication and bleed.  Best would be conservative therapy with anticoagulation and wrapping her legs.  Lonni DOROTHA Gaskins, MD [facility] [place] Office: [phone]

## 2024-08-20 NOTE — Progress Notes (Addendum)
 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 extending into the bilateral common iliac and femoral veins -Recent DVT and PE  Allergies  Allergen Reactions   Elemental Sulfur Hives   Shellfish Allergy Anaphylaxis   Shellfish Allergy    Sulfa Antibiotics Hives    Patient Measurements: Height: 5' 5 (165.1 cm) Weight: 54.4 kg (120 lb) IBW/kg (Calculated) : 57 HEPARIN DW (KG): 54.4  Vital Signs: Temp: 98 F (36.7 C) (10/19 0219) Temp Source: Oral (10/19 0219) BP: 127/75 (10/19 0215) Pulse Rate: 108 (10/19 0215)  Labs: Recent Labs    08/19/24 1606  HGB 9.7*  HCT 31.7*  PLT 412*  CREATININE 1.01*    Estimated Creatinine Clearance: 36.9 mL/min (A) (by C-G formula based on SCr of 1.01 mg/dL (H)).   Medical History: Past Medical History:  Diagnosis Date   Cancer [facility])    DVT of deep femoral vein, right (HCC)    Hypertension    Stroke [facility])     Assessment: 83 y/o F presents to the ED with left leg swelling and pain. She was recently discharged from [facility]. During her previous admission she was diagnosed with DVT and PE and was initially on Apixaban , then switched to heparin, then and IVC filter was placed along with prophylactic dose Lovenox . CT today shows extensive IVC thrombus. Starting heparin. Labs above reviewed. Stroke during previous admission, also thought to have some bleeding from gastric cancer-will avoid bolus.  Goal of Therapy:  Heparin level 0.3-0.5 units/ml Monitor platelets by anticoagulation protocol: Yes   Plan:  Bolus deferred Start heparin drip at 700 units/hr Heparin level in 8 hours Daily CBC and heparin level Monitor closely for bleeding  Lynwood Mckusick, PharmD, BCPS Clinical Pharmacist Phone: [phone]

## 2024-08-20 NOTE — ED Notes (Signed)
 CCMD notified for monitoring

## 2024-08-20 NOTE — ED Notes (Signed)
 NO urine output noted thus far in shift. She is unable to void when given opportunity to void spontaneously. Bladder scan 286 mL. In/out cath performed with 8Fr catheter. 300 mL output.

## 2024-08-20 NOTE — H&P (Signed)
 History and Physical    Patient: Tiffany [name] FMW:[id number] DOB: 1940-12-21 DOA: 08/19/2024 DOS: the patient was seen and examined on 08/20/2024 PCP: Benjamine Aland, MD  Patient coming from: Home  Chief Complaint:  Chief Complaint  Patient presents with   Leg Pain   HPI: Tiffany [name] is a 83 y.o. 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  Patient unable to answer due to her expressive aphasia. Most of the history is  obtained from the daughter over the phone.  No fevers at home.   EDP work up Afebrile, , normotensive.  Labs show leukocytosis, wbc count of 20,300,hemoglobin of 9.7 , hematocrit is 31.7, lactic acid of 1.2. hemoglobin of 9.7. UA is negative for infection.    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 2. IVC filter in place. 3. Interval increase in size of multiple hepatic masses. Peritoneal carcinomatosis along the left abdomen. Similar distal hypodense gastric wall Thickening.  X rays of the left femur is negative .    Vascular surgery consulted by EDP. She was also started on IV heparin.  She was referred to [facility] for admission.   Review of Systems: unable to review all systems due to the inability of the patient to answer questions. Past Medical History:  Diagnosis Date   Cancer [facility])    DVT of deep femoral vein,  right (HCC)    Hypertension    Stroke [facility])    Past Surgical History:  Procedure Laterality Date   IR IVC FILTER PLMT / S&I /IMG GUID/MOD SED  08/04/2024   IR US  LIVER BIOPSY  08/04/2024   Social History:  reports that she has never smoked. She has never been exposed to tobacco smoke. She has never used smokeless tobacco. She reports that she does not currently use alcohol. She reports that she does not use drugs.  Allergies  Allergen Reactions   Elemental Sulfur Hives   Shellfish Allergy Anaphylaxis   Shellfish Allergy    Sulfa Antibiotics Hives    Family History  Problem Relation Age of Onset   Breast cancer Sister     Prior to Admission medications   Medication Sig Start Date End Date Taking? Authorizing Provider  acetaminophen  (TYLENOL ) 500 MG tablet Take 2 tablets (1,000 mg total) by mouth 3 (three) times daily. Patient taking differently: Take 500 mg by mouth 2 (two) times daily. 08/10/24  Yes Love, Sharlet RAMAN, PA-C  ascorbic acid (VITAMIN C) 500 MG tablet Take 1 tablet (500 mg total) by mouth daily. 08/10/24  Yes Love, Sharlet RAMAN, PA-C  atorvastatin (LIPITOR) 20 MG tablet Take 1 tablet (20 mg total) by mouth daily. 08/10/24 09/10/24 Yes Love, Sharlet RAMAN, PA-C  dorzolamide (TRUSOPT) 2 % ophthalmic solution Place 1 drop into both eyes 2 (two) times daily. 06/24/24  Yes [provider]  enoxaparin  (LOVENOX ) 40 MG/0.4ML injection Inject 0.4 mLs (40 mg total) into the skin daily. 08/11/24  Yes Love, Sharlet RAMAN, PA-C  ferrous sulfate  325 (65 FE) MG tablet Take 1 tablet (325 mg total) by mouth daily with breakfast. 08/10/24  Yes Love, Pamela S, PA-C  LUMIGAN 0.01 % SOLN Place 1 drop into both eyes at bedtime. 07/14/24  Yes [provider]  metoprolol tartrate (LOPRESSOR) 25 MG tablet Take 1 tablet (25 mg total) by mouth daily. 08/10/24  Yes Love, Sharlet RAMAN, PA-C  mirtazapine (REMERON) 15 MG tablet Take 1 tablet (15 mg total) by mouth at bedtime. 08/10/24  Yes Love, Sharlet RAMAN, PA-C   pantoprazole (PROTONIX) 40 MG tablet Take 1 tablet (40 mg total) by mouth 2 (two) times daily. 08/10/24  Yes Love, Sharlet RAMAN, PA-C  PREBIOTIC PRODUCT PO Take 1 capsule by mouth daily.   Yes [provider]  predniSONE (DELTASONE) 5 MG tablet Take 1 tablet (5 mg total) by mouth daily. Patient taking differently: Take 2.5 mg by mouth daily. 08/10/24  Yes Love, Sharlet RAMAN, PA-C  Probiotic Product (PROBIOTIC PO) Take 1 capsule by mouth daily.   Yes [provider]  metoprolol tartrate (LOPRESSOR) 50 MG tablet Take 50 mg by mouth daily. Patient not taking: Reported on 08/20/2024 08/17/24   [provider]    Physical Exam: Vitals:   08/20/24 0955 08/20/24 1030 08/20/24 1200 08/20/24 1300  BP:  113/68 111/62 124/73  Pulse:  (!) 123 (!) 118 (!) 116  Resp:  16  16  Temp: 97.8 F (36.6 C)     TempSrc:      SpO2:  100% 100% 100%  Weight:      Height:       General exam: ill appearing elderly woman, not in distress.  Respiratory system: Clear to auscultation. Respiratory effort normal. Cardiovascular system: S1 & S2 heard, RRR.  Gastrointestinal system: Abdomen is soft bs+ Central nervous system: Alert and oriented to person, expressive aphasia.  Extremities: leg swelling left >right  Skin: No rashes,  Psychiatry: mood is appropriate.   Data Reviewed: Results for orders placed or performed during the hospital encounter of 08/19/24 (from the past 24 hours)  CBC     Status: Abnormal   Collection Time: 08/19/24  4:06 PM  Result Value Ref Range   WBC 20.3 (H) 4.0 - 10.5 K/uL   RBC 3.70 (L) 3.87 - 5.11 MIL/uL   Hemoglobin 9.7 (L) 12.0 - 15.0 g/dL   HCT 68.2 (L) 63.9 - 53.9 %   MCV 85.7 80.0 - 100.0 fL   MCH 26.2 26.0 - 34.0 pg   MCHC 30.6 30.0 - 36.0 g/dL   RDW 80.5 (H) 88.4 - 84.4 %   Platelets 412 (H) 150 - 400 K/uL   nRBC 0.0 0.0 - 0.2 %  Comprehensive metabolic panel     Status: Abnormal   Collection Time: 08/19/24  4:06 PM  Result Value Ref Range   Sodium  139 135 - 145 mmol/L   Potassium 3.6 3.5 - 5.1 mmol/L   Chloride 105 98 - 111 mmol/L   CO2 19 (L) 22 - 32 mmol/L   Glucose, Bld 136 (H) 70 - 99 mg/dL   BUN 26 (H) 8 - 23 mg/dL   Creatinine, Ser 8.98 (H) 0.44 - 1.00 mg/dL   Calcium 8.8 (L) 8.9 - 10.3 mg/dL   Total Protein 7.1 6.5 - 8.1 g/dL   Albumin 2.2 (L) 3.5 - 5.0 g/dL   AST 30 15 - 41 U/L   ALT 22 0 - 44 U/L   Alkaline Phosphatase 286 (H) 38 - 126 U/L  Total Bilirubin 0.8 0.0 - 1.2 mg/dL   GFR, Estimated 56 (L) >60 mL/min   Anion gap 15 5 - 15  Lactic acid, plasma     Status: None   Collection Time: 08/19/24  4:07 PM  Result Value Ref Range   Lactic Acid, Venous 1.2 0.5 - 1.9 mmol/L  Urinalysis, w/ Reflex to Culture (Infection Suspected) -Urine, Clean Catch     Status: Abnormal   Collection Time: 08/20/24  2:00 AM  Result Value Ref Range   Specimen Source URINE, CLEAN CATCH    Color, Urine AMBER (A) YELLOW   APPearance CLOUDY (A) CLEAR   Specific Gravity, Urine 1.039 (H) 1.005 - 1.030   pH 5.0 5.0 - 8.0   Glucose, UA NEGATIVE NEGATIVE mg/dL   Hgb urine dipstick NEGATIVE NEGATIVE   Bilirubin Urine NEGATIVE NEGATIVE   Ketones, ur 5 (A) NEGATIVE mg/dL   Protein, ur 899 (A) NEGATIVE mg/dL   Nitrite NEGATIVE NEGATIVE   Leukocytes,Ua NEGATIVE NEGATIVE   RBC / HPF 0-5 0 - 5 RBC/hpf   WBC, UA 0-5 0 - 5 WBC/hpf   Bacteria, UA FEW (A) NONE SEEN   Squamous Epithelial / HPF 0-5 0 - 5 /HPF   Mucus PRESENT      Assessment and Plan:   Worsening left leg swelling and pain due to worsening left lower extremity thrombus Vascular surgery consulted, suggested IV heparin and not a candidate for intervention due to complications.  Continue with IV heparin.  Discussed the plan with the patient's daughter over the phone.    Metastatic adeno ca with primary possibly gastric in origin:  Follows up with Dr Lanny. Left message for Dr Lanny.    Left MCA infarct:  Resume statin, .    GERD Continue with protonix.     Leukocytosis From steroids.    Anemia of malignancy Transfuse to keep hemoglobin greater than 7.       Advance Care Planning: . Discussed with the daughter and confirmed DNR / no interventions if she has a worse turn of events. They would like to talk to Dr Lanny regarding GOC in am.   Consults: vascular surgery,   Family Communication:  discussed with daughter   Severity of Illness: The appropriate patient status for this patient is INPATIENT. Inpatient status is judged to be reasonable and necessary in order to provide the required intensity of service to ensure the patient's safety. The patient's presenting symptoms, physical exam findings, and initial radiographic and laboratory data in the context of their chronic comorbidities is felt to place them at high risk for further clinical deterioration. Furthermore, it is not anticipated that the patient will be medically stable for discharge from the hospital within 2 midnights of admission.   * I certify that at the point of admission it is my clinical judgment that the patient will require inpatient hospital care spanning beyond 2 midnights from the point of admission due to high intensity of service, high risk for further deterioration and high frequency of surveillance required.*  Author: Jaxtyn Linville, MD 08/20/2024 1:20 PM  For on call review [email or web address].

## 2024-08-20 NOTE — Progress Notes (Signed)
 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 extending into the bilateral common iliac and femoral veins -Recent DVT and PE  Allergies  Allergen Reactions   Elemental Sulfur Hives   Shellfish Allergy Anaphylaxis   Shellfish Allergy    Sulfa Antibiotics Hives    Patient Measurements: Height: 5' 5 (165.1 cm) Weight: 54.4 kg (120 lb) IBW/kg (Calculated) : 57 HEPARIN DW (KG): 54.4  Vital Signs: Temp: 99.9 F (37.7 C) (10/19 1348) Temp Source: Oral (10/19 1348) BP: 124/73 (10/19 1300) Pulse Rate: 116 (10/19 1300)  Labs: Recent Labs    08/19/24 1606 08/20/24 1200  HGB 9.7*  --   HCT 31.7*  --   PLT 412*  --   HEPARINUNFRC  --  0.15*  CREATININE 1.01*  --     Estimated Creatinine Clearance: 36.9 mL/min (A) (by C-G formula based on SCr of 1.01 mg/dL (H)).   Medical History: Past Medical History:  Diagnosis Date   Cancer [facility])    DVT of deep femoral vein, right (HCC)    Hypertension    Stroke [facility])     Assessment: 83 y/o F presents to the ED with left leg swelling and pain. She was recently discharged from [facility]. During her previous admission she was diagnosed with DVT and PE and was initially on Apixaban , then switched to heparin, then and IVC filter was placed along with prophylactic dose Lovenox . CT today shows extensive IVC thrombus. Starting heparin. Labs above reviewed. Stroke during previous admission, also thought to have some bleeding from gastric cancer-will avoid bolus.  10/19: First heparin level 0.15. No infusion issues or bleeding noted.  Goal of Therapy:  Heparin level 0.3-0.5 units/ml Monitor platelets by anticoagulation protocol: Yes   Plan:  Increase heparin drip to 800 units/hr Heparin level in 8 hours Daily CBC and heparin level Monitor closely for bleeding  Larraine Brazier, PharmD Clinical Pharmacist 08/20/2024  1:56  PM **Pharmacist phone directory can now be found on [email or web address] ([email or web address]).  Listed under [facility].

## 2024-08-20 NOTE — Plan of Care (Signed)
   Problem: Coping: Goal: Level of anxiety will decrease Outcome: Progressing

## 2024-08-20 NOTE — ED Notes (Signed)
Returned from CT scan.

## 2024-08-20 NOTE — ED Provider Notes (Signed)
 [place] [facility] AT [facility] Provider Note  CSN: [id number] Arrival date & time: 08/19/24 1447  Chief Complaint(s) Leg Pain  HPI Tiffany [name] is a 83 y.o. 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  Due to increased risk of bleeding, patient was taken off of Eliquis  and placed on 40 mg Lovenox .  She presents today for several days of left-sided leg pain and swelling.  Seen at urgent care and sent here to rule out DVT.  She denies any falls or trauma.  Endorsing worsening abdominal pain.  Endorses nausea without emesis.  No diarrhea.  No other physical complaints.  The history is provided by the patient.    Past Medical History Past Medical History:  Diagnosis Date  . Cancer (HCC)   . DVT of deep femoral vein, right (HCC)   . Hypertension   . Stroke [facility])    Patient Active Problem List   Diagnosis Date Noted  . Cancer, 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) 08/14/2024  . Acute pulmonary embolism (HCC) 08/10/2024  . At risk for dehydration due to poor fluid intake 08/10/2024  . 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 Acute on chronic anemia 08/02/2024  . Gastric mass 08/02/2024  . Abnormal finding on GI tract imaging 08/02/2024  . Acute ischemic left middle cerebral artery (MCA) stroke (HCC) 07/31/2024  . Aphasia 07/23/2024  . Stroke (cerebrum) (HCC) 07/23/2024  . Syncope 10/17/2023   Home Medication(s) Prior to Admission medications   Medication Sig Start Date End Date Taking? Authorizing Provider  metoprolol tartrate (LOPRESSOR) 50 MG tablet Take 50 mg by mouth daily. 08/17/24  Yes [provider]  acetaminophen  (TYLENOL ) 500 MG tablet Take 2 tablets (1,000 mg total) by mouth 3 (three) times daily. 08/10/24   Love, Sharlet RAMAN, PA-C  ascorbic acid (VITAMIN C) 500 MG tablet Take 1 tablet (500 mg  total) by mouth daily. 08/10/24   Love, Sharlet RAMAN, PA-C  atorvastatin (LIPITOR) 20 MG tablet Take 1 tablet (20 mg total) by mouth daily. 08/10/24 09/10/24  Love, Sharlet RAMAN, PA-C  dorzolamide (TRUSOPT) 2 % ophthalmic solution Place 1 drop into both eyes 3 (three) times daily. 06/24/24   [provider]  enoxaparin  (LOVENOX ) 40 MG/0.4ML injection Inject 0.4 mLs (40 mg total) into the skin daily. 08/11/24   Love, Sharlet RAMAN, PA-C  ferrous sulfate 325 (65 FE) MG tablet Take 1 tablet (325 mg total) by mouth daily with breakfast. 08/10/24   Love, Sharlet RAMAN, PA-C  LUMIGAN 0.01 % SOLN Place 1 drop into both eyes at bedtime. 07/14/24   [provider]  metoprolol tartrate (LOPRESSOR) 25 MG tablet Take 1 tablet (25 mg total) by mouth daily. 08/10/24   Love, Sharlet RAMAN, PA-C  mirtazapine (REMERON) 15 MG tablet Take 1 tablet (15 mg total) by mouth at bedtime. 08/10/24   Love, Sharlet RAMAN, PA-C  pantoprazole (PROTONIX) 40 MG tablet Take 1 tablet (40 mg total) by mouth 2 (two) times daily. 08/10/24   Love, Sharlet RAMAN, PA-C  PREBIOTIC PRODUCT PO Take 1 capsule by mouth daily.    [provider]  predniSONE (DELTASONE) 5 MG tablet Take 1 tablet (5 mg total) by mouth daily. 08/10/24   Love, Sharlet RAMAN, PA-C  Probiotic Product (PROBIOTIC PO) Take 1 capsule by mouth daily.    [provider]  Allergies Elemental sulfur, Shellfish allergy, Shellfish allergy, and Sulfa antibiotics  Review of Systems Review of Systems As noted in HPI  Physical Exam Vital Signs  I have reviewed the triage vital signs BP 139/75   Pulse (!) 108   Temp 98 F (36.7 C) (Oral)   Resp 20   Ht 5' 5 (1.651 m)   Wt 54.4 kg   SpO2 93%   BMI 19.97 kg/m   Physical Exam Vitals reviewed.  Constitutional:      General: She is not in acute distress.    Appearance: She is well-developed. She is  not diaphoretic.  HENT:     Head: Normocephalic and atraumatic.     Right Ear: External ear normal.     Left Ear: External ear normal.     Nose: Nose normal.  Eyes:     General: No scleral icterus.    Conjunctiva/sclera: Conjunctivae normal.  Neck:     Trachea: Phonation normal.  Cardiovascular:     Rate and Rhythm: Normal rate and regular rhythm.  Pulmonary:     Effort: Pulmonary effort is normal. No respiratory distress.     Breath sounds: No stridor.  Abdominal:     General: There is no distension.     Tenderness: There is abdominal tenderness in the epigastric area, left upper quadrant and left lower quadrant.  Musculoskeletal:        General: Normal range of motion.     Cervical back: Normal range of motion.     Right lower leg: 1+ Edema present.     Left lower leg: 1+ Edema present.  Neurological:     Mental Status: She is alert and oriented to person, place, and time.  Psychiatric:        Behavior: Behavior normal.     ED Results and Treatments Labs (all labs ordered are listed, but only abnormal results are displayed) Labs Reviewed  CBC - Abnormal; Notable for the following components:      Result Value   WBC 20.3 (*)    RBC 3.70 (*)    Hemoglobin 9.7 (*)    HCT 31.7 (*)    RDW 19.4 (*)    Platelets 412 (*)    All other components within normal limits  COMPREHENSIVE METABOLIC PANEL WITH GFR - Abnormal; Notable for the following components:   CO2 19 (*)    Glucose, Bld 136 (*)    BUN 26 (*)    Creatinine, Ser 1.01 (*)    Calcium 8.8 (*)    Albumin 2.2 (*)    Alkaline Phosphatase 286 (*)    GFR, Estimated 56 (*)    All other components within normal limits  URINALYSIS, W/ REFLEX TO CULTURE (INFECTION SUSPECTED) - Abnormal; Notable for the following components:   Color, Urine AMBER (*)    APPearance CLOUDY (*)    Specific Gravity, Urine 1.039 (*)    Ketones, ur 5 (*)    Protein, ur 100 (*)    Bacteria, UA FEW (*)    All other components within normal  limits  LACTIC ACID, PLASMA  HEPARIN LEVEL (UNFRACTIONATED)  EKG  EKG Interpretation Date/Time:  Saturday August 19 2024 16:20:45 EDT Ventricular Rate:  99 PR Interval:  146 QRS Duration:  76 QT Interval:  330 QTC Calculation: 423 R Axis:   -12  Text Interpretation: Normal sinus rhythm Moderate voltage criteria for LVH, may be normal variant ( R in aVL , Cornell product ) Possible Lateral infarct , age undetermined Abnormal ECG When compared with ECG of 17-Oct-2023 19:47, PREVIOUS ECG IS PRESENT Confirmed by Trine Likes [phone]) on 08/20/2024 12:30:01 AM       Radiology CT ABDOMEN PELVIS W CONTRAST Addendum Date: 08/20/2024 ** ADDENDUM #1 ** EXAM: CT ABDOMEN AND PELVIS WITH CONTRAST 08/20/2024 01:43:00 AM TECHNIQUE: CT of the abdomen and pelvis was performed with the administration of 75 mL of iohexol (OMNIPAQUE) 350 MG/ML injection. Multiplanar reformatted images are provided for review. Automated exposure control, iterative reconstruction, and/or weight-based adjustment of the mA/kV was utilized to reduce the radiation dose to as low as reasonably achievable. COMPARISON: CT abd pelvis 08/01/2024. CLINICAL HISTORY: Abdominal pain, acute, nonlocalized. FINDINGS: LOWER CHEST: No acute abnormality. LIVER: Interval increase in size of multiple hepatic masses with as an example a conglomeration of masses measuring 8.9 x 7.3 cm (from 7.7 x 6.6 cm). GALLBLADDER AND BILE DUCTS: Gallbladder is unremarkable. No biliary ductal dilatation. SPLEEN: No acute abnormality. PANCREAS: No acute abnormality. ADRENAL GLANDS: No acute abnormality. KIDNEYS, URETERS AND BLADDER: Bilateral fluid density lesions of the kidneys likely represent a simple renal cysts. Simple renal cysts do not require additional follow-up unless clinically indicated due to signs/symptoms. Per consensus, no follow-up is  needed for simple Bosniak type 1 and 2 renal cysts, unless the patient has a malignancy history or risk factors. No stones in the kidneys or ureters. No hydronephrosis. No perinephric or periureteral stranding. Urinary bladder is unremarkable. GI AND BOWEL: Similar appearing distal hypodense gastric wall thickening. There is no bowel obstruction. 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 No ascites. No free air. VASCULATURE: Filling defects throughout the vena cava and extending to the bilateral common iliac veins down to the femoral veins. Associated fat stranding along the left iliac and femoral veins suggestive of possible overlying thrombophlebitis. Recommend bilateral lower extremity ultrasound for further evaluation. Aorta is normal in caliber. Atherosclerotic plaque. LYMPH NODES: Redemonstration of mesenteric hypodense nodules likely representing lymph nodes or peritoneal deposits , as an example : stable 2.4x1.9 cm within the mid abdomen (4:37) REPRODUCTIVE ORGANS: No acute abnormality. BONES AND SOFT TISSUES: No acute osseous abnormality. No focal soft tissue abnormality. IMPRESSION: 1. 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 2. IVC filter in place. 3. Interval increase in size of multiple hepatic masses. Peritoneal carcinomatosis along the left abdomen. Similar distal hypodense gastric wall thickening. 4. Findings discussed with Dr. Trine over the phone by Dr. Margarite on 10/19/5 at 2:22am. Electronically signed by: Kate Margarite MD 08/20/2024 02:25 AM EDT RP Workstation: [id number]   Result Date: 08/20/2024 ** ORIGINAL REPORT ** EXAM: CT ABDOMEN AND PELVIS WITH CONTRAST 08/20/2024 01:43:00 AM  TECHNIQUE: CT of the abdomen and pelvis was performed with the administration of 75 mL of iohexol (OMNIPAQUE) 350 MG/ML injection. Multiplanar reformatted images are provided for review. Automated exposure control, iterative reconstruction, and/or weight-based adjustment of the mA/kV was utilized to reduce the radiation dose to as low  as reasonably achievable. COMPARISON: CT abd pelvis 08/01/2024. CLINICAL HISTORY: Abdominal pain, acute, nonlocalized. FINDINGS: LOWER CHEST: No acute abnormality. LIVER: Interval increase in size of multiple hepatic masses with as an example a conglomeration of masses measuring 8.9 x 7.3 cm (from 7.7 x 6.6 cm). GALLBLADDER AND BILE DUCTS: Gallbladder is unremarkable. No biliary ductal dilatation. SPLEEN: No acute abnormality. PANCREAS: No acute abnormality. ADRENAL GLANDS: No acute abnormality. KIDNEYS, URETERS AND BLADDER: Bilateral fluid density lesions of the kidneys likely represent a simple renal cysts. Simple renal cysts do not require additional follow-up unless clinically indicated due to signs/symptoms. Per consensus, no follow-up is needed for simple Bosniak type 1 and 2 renal cysts, unless the patient has a malignancy history or risk factors. No stones in the kidneys or ureters. No hydronephrosis. No perinephric or periureteral stranding. Urinary bladder is unremarkable. GI AND BOWEL: Similar appearing distal hypodense gastric wall thickening. There is no bowel obstruction. 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 No ascites. No free air. VASCULATURE: Filling defects throughout the vena cava and extending to the bilateral common iliac veins down to the femoral veins. Associated fat stranding along the left iliac and femoral veins suggestive of possible overlying thrombophlebitis. Recommend bilateral lower extremity ultrasound for further evaluation. Aorta is normal in caliber. Atherosclerotic plaque. LYMPH  NODES: Redemonstration of mesenteric hypodense nodules likely representing lymph nodes or peritoneal deposits , as an example : stable 2.4x1.9 cm within the mid abdomen (4:37) REPRODUCTIVE ORGANS: No acute abnormality. BONES AND SOFT TISSUES: No acute osseous abnormality. No focal soft tissue abnormality. IMPRESSION: 1. 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 2. Interval increase in size of multiple hepatic masses. Peritoneal carcinomatosis along the left abdomen. Similar distal hypodense gastric wall thickening. Electronically signed by: Morgane Naveau MD 08/20/2024 02:20 AM EDT RP Workstation: [id number]   DG FEMUR MIN 2 VIEWS LEFT Result Date: 08/20/2024 EXAM: 2 VIEW(S) XRAY OF THE LEFT FEMUR 08/20/2024 01:29:31 AM COMPARISON: None available. CLINICAL HISTORY: 00502 Leg pain 99497. LEFT LEG PAIN FINDINGS: BONES AND JOINTS: No acute fracture. No focal osseous lesion. No joint dislocation. SOFT TISSUES: The soft tissues are unremarkable. IMPRESSION: 1. Normal study. Electronically signed by: Norman Gatlin MD 08/20/2024 01:32 AM EDT RP Workstation: [id number]   DG Chest 1 View Result Date: 08/19/2024 CLINICAL DATA:  Shortness of breath. EXAM: CHEST  1 VIEW COMPARISON:  08/06/2024 FINDINGS: The cardiomediastinal contours are normal. Minor subsegmental atelectasis at the right lung base. Pulmonary vasculature is normal. No consolidation, pleural effusion, or pneumothorax. No acute osseous abnormalities are seen. IMPRESSION: No active disease. Electronically Signed   By: Andrea Gasman M.D.   On: 08/19/2024 17:10    Medications Ordered in ED Medications  heparin ADULT infusion 100 units/mL (25000 units/250mL) (700 Units/hr Intravenous New Bag/Given 08/20/24 0313)   iohexol (OMNIPAQUE) 350 MG/ML injection 75 mL (75 mLs Intravenous Contrast Given 08/20/24 0148)   Procedures Procedures  (including critical care time) Medical Decision Making / ED Course   Medical Decision Making Amount and/or Complexity of Data Reviewed Labs: ordered. Decision-making details documented in ED Course. Radiology: ordered and independent interpretation performed. Decision-making details documented in ED Course. ECG/medicine tests: ordered and independent interpretation performed. Decision-making details documented in ED Course.  Risk Prescription drug management. Decision regarding hospitalization.    Patient presents with  left leg pain and swelling.  Seen at urgent care and sent here for evaluation of possible DVT.  On my exam, patient mostly has tenderness of the left lateral leg.  She does have symmetric lower extremity edema.  She also has left-sided abdominal pain  Differential diagnosis considered and workup below.  CBC with worsening leukocytosis.  The patient is chronically on prednisone but I do not suspect that this is related to steroid use.  Will need to assess for new infectious or inflammatory process.  CMP without significant electrolyte derangements.  Hyperglycemia without DKA.  Mild renal insufficiency without overt AKI.  Elevated alk phos but no known bone mets.  No biliary obstruction.    Lactic acid normal.  In triage, chest x-ray was obtained and negative for any pneumonia, pneumothorax, pulmonary edema pleural effusion.  I ordered a x-ray of the left femur to rule out bony lesion and it was negative. Ordered a CT scan of the abdomen and pelvis to assess for any new intra-abdominal [place]/infectious process or bowel obstruction.  It revealed extensive thrombus throughout the IVC into bilateral common iliac and femoral veins.  She has fat stranding to the left leg suspicious for thrombophlebitis.  She also has increased hepatic metastases along  with carcinomatosis.  Given the extent of the thrombus, I consulted vascular surgery and spoke with Dr. Gretta.  Given her recent stroke, and worsening liver mets, patient may not be a candidate for endovascular intervention.  He will consult on the patient in the morning to perform a more thorough evaluation to consider options.  He recommended heparin drip and medicine admission. Clinical Course as of 08/20/24 [id number]  [place] Aug 20, 2024  0346 Spoke with Dr. Alfornia from the hospitalist service who will admit patient for further workup and management. [PC]    Clinical Course User Index [PC] Luna Audia, Raynell Moder, MD    Final Clinical Impression(s) / ED Diagnoses Final diagnoses:  IVC thrombosis (HCC)  Acute bilateral deep vein thrombosis (DVT) of femoral veins (HCC)    This chart was dictated using voice recognition software.  Despite best efforts to proofread,  errors can occur which can change the documentation meaning.    Trine Raynell Moder, MD 08/20/24 [phone]

## 2024-08-21 ENCOUNTER — Other Ambulatory Visit: Payer: Self-pay

## 2024-08-21 ENCOUNTER — Telehealth (HOSPITAL_COMMUNITY): Payer: Self-pay

## 2024-08-21 ENCOUNTER — Other Ambulatory Visit (HOSPITAL_COMMUNITY): Payer: Self-pay

## 2024-08-21 DIAGNOSIS — I82413 Acute embolism and thrombosis of femoral vein, bilateral: Secondary | ICD-10-CM

## 2024-08-21 DIAGNOSIS — I82220 Acute embolism and thrombosis of inferior vena cava: Secondary | ICD-10-CM

## 2024-08-21 DIAGNOSIS — I824Y9 Acute embolism and thrombosis of unspecified deep veins of unspecified proximal lower extremity: Secondary | ICD-10-CM | POA: Diagnosis not present

## 2024-08-21 LAB — CBC
HCT: 27.9 % — ABNORMAL LOW (ref 36.0–46.0)
Hemoglobin: 8.8 g/dL — ABNORMAL LOW (ref 12.0–15.0)
MCH: 26.1 pg (ref 26.0–34.0)
MCHC: 31.5 g/dL (ref 30.0–36.0)
MCV: 82.8 fL (ref 80.0–100.0)
Platelets: 506 K/uL — ABNORMAL HIGH (ref 150–400)
RBC: 3.37 MIL/uL — ABNORMAL LOW (ref 3.87–5.11)
RDW: 19.3 % — ABNORMAL HIGH (ref 11.5–15.5)
WBC: 12.6 K/uL — ABNORMAL HIGH (ref 4.0–10.5)
nRBC: 0 % (ref 0.0–0.2)

## 2024-08-21 LAB — HEPARIN LEVEL (UNFRACTIONATED)
Heparin Unfractionated: 0.23 [IU]/mL — ABNORMAL LOW (ref 0.30–0.70)
Heparin Unfractionated: 0.36 [IU]/mL (ref 0.30–0.70)

## 2024-08-21 LAB — BASIC METABOLIC PANEL WITH GFR
Anion gap: 15 (ref 5–15)
BUN: 19 mg/dL (ref 8–23)
CO2: 21 mmol/L — ABNORMAL LOW (ref 22–32)
Calcium: 8.6 mg/dL — ABNORMAL LOW (ref 8.9–10.3)
Chloride: 105 mmol/L (ref 98–111)
Creatinine, Ser: 0.83 mg/dL (ref 0.44–1.00)
GFR, Estimated: >60 mL/min (ref >60)
Glucose, Bld: 93 mg/dL (ref 70–99)
Potassium: 3.3 mmol/L — ABNORMAL LOW (ref 3.5–5.1)
Sodium: 141 mmol/L (ref 135–145)

## 2024-08-21 MED ORDER — POTASSIUM CHLORIDE 20 MEQ PO PACK
20.0000 meq | PACK | Freq: Two times a day (BID) | ORAL | Status: AC
Start: 2024-08-21 — End: 2024-08-23
  Administered 2024-08-21 – 2024-08-23 (×4): 20 meq via ORAL
  Filled 2024-08-21 (×4): qty 1

## 2024-08-21 MED ORDER — ENSURE PLUS HIGH PROTEIN PO LIQD
237.0000 mL | Freq: Two times a day (BID) | ORAL | Status: DC
  Administered 2024-08-22 – 2024-08-23 (×3): 237 mL via ORAL

## 2024-08-21 MED ORDER — POTASSIUM CHLORIDE CRYS ER 20 MEQ PO TBCR
40.0000 meq | EXTENDED_RELEASE_TABLET | Freq: Two times a day (BID) | ORAL | Status: DC
  Administered 2024-08-21: 40 meq via ORAL

## 2024-08-21 NOTE — Progress Notes (Signed)
 [facility]                                                                               Brigit Doke, is a 83 y.o. female, DOB - [birth year], FMW:[id number] Admit date - 08/19/2024    Outpatient Primary MD for the patient is Benjamine Aland, MD  LOS - 1  days    Brief summary    Lenoria Narine is a 83 y.o. 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  Vascular surgery consulted by EDP. She was also started on IV heparin.  She was referred to [facility] for admission.   Assessment & Plan    Worsening DVT in bilateral lower extremities extending to the filter She presents with worsening leg pain and leg swelling despite lovenox .  Currently on IV heparin. Vascular surgery consulted and suggested patient is not a candidate for thrombectomy due to her recent history of CVA.  Oncology consulted for goals of care.    Metastatic adeno ca , possibly primary is gastric.  Further management as per oncology.    MCA infarct Resume statin.    GERD Continue with protonix.   Fever overnight  Suspect from extensive DVT.    Hypokalemia Replaced.    Leukocytosis slowly improving.    Estimated body mass index is 19.99 kg/m as calculated from the following:   Height as of this encounter: 5'  5 (1.651 m).   Weight as of this encounter: 54.5 kg.  Code Status: DNR  DVT Prophylaxis:  heparin.    Level of Care: Level of care: Telemetry Medical Family Communication: none at bedside.   Disposition Plan:     Remains inpatient appropriate:  pending.   Procedures:  None.   Consultants:   Oncology Vascular surgery.   Antimicrobials:   Anti-infectives (From admission, onward)    None        Medications  Scheduled Meds:  acetaminophen   500 mg Oral BID   ascorbic acid  500 mg Oral Daily   atorvastatin  20 mg Oral Daily   dorzolamide  1 drop Both Eyes BID   [START ON 08/22/2024] feeding supplement  237 mL Oral BID BM   ferrous sulfate  325 mg Oral Q breakfast   latanoprost  1 drop Both Eyes QHS   metoprolol tartrate  25 mg Oral Daily   mirtazapine  15 mg Oral QHS   pantoprazole  40 mg Oral BID   potassium chloride  40 mEq Oral BID   predniSONE  2.5 mg Oral Daily   Continuous Infusions:  heparin 950 Units/hr (08/21/24 1132)  PRN Meds:.    Subjective:   Ernestine Rohman was seen and examined today.  No new complaints   Objective:   Vitals:   08/20/24 2018 08/21/24 0430 08/21/24 0819 08/21/24 1702  BP: 135/63 114/63 139/70 132/89  Pulse: 100 78 (!) 102 93  Resp: 19 16 18 19   Temp: 99.4 F (37.4 C) 98.2 F (36.8 C) 98 F (36.7 C) 98.5 F (36.9 C)  TempSrc: Oral Oral Oral Oral  SpO2:  98% 98% 99%  Weight: 54.5 kg     Height: 5' 5 (1.651 m)       Intake/Output Summary (Last 24 hours) at 08/21/2024 1906 Last data filed at 08/21/2024 1702 Gross per 24 hour  Intake 534.95 ml  Output 0 ml  Net 534.95 ml   Filed Weights   08/20/24 0255 08/20/24 2018  Weight: 54.4 kg 54.5 kg     Exam General: Alert and oriented x 3, NAD Cardiovascular: S1 S2 auscultated, no murmurs, RRR Respiratory: Clear to auscultation bilaterally, no wheezing, rales or rhonchi Gastrointestinal: Soft, nontender, nondistended, + bowel sounds Ext:  bilateral leg edema.    Data Reviewed:  I have personally reviewed following labs and imaging studies   CBC Lab Results  Component Value Date   WBC 12.6 (H) 08/21/2024   RBC 3.37 (L) 08/21/2024   HGB 8.8 (L) 08/21/2024   HCT 27.9 (L) 08/21/2024   MCV 82.8 08/21/2024   MCH 26.1 08/21/2024   PLT 506 (H) 08/21/2024   MCHC 31.5 08/21/2024   RDW 19.3 (H) 08/21/2024   LYMPHSABS 2.5 08/20/2024   MONOABS 1.8 (H) 08/20/2024   EOSABS 0.1 08/20/2024   BASOSABS 0.1 08/20/2024     Last metabolic panel Lab Results  Component Value Date   NA 141 08/21/2024   K 3.3 (L) 08/21/2024   CL 105 08/21/2024   CO2 21 (L) 08/21/2024   BUN 19 08/21/2024   CREATININE 0.83 08/21/2024   GLUCOSE 93 08/21/2024   GFRNONAA >60 08/21/2024   CALCIUM 8.6 (L) 08/21/2024   PHOS 3.9 10/18/2023   PROT 7.1 08/19/2024   ALBUMIN 2.2 (L) 08/19/2024   BILITOT 0.8 08/19/2024   ALKPHOS 286 (H) 08/19/2024   AST 30 08/19/2024   ALT 22 08/19/2024   ANIONGAP 15 08/21/2024    CBG (last 3)  No results for input(s): GLUCAP in the last 72 hours.    Coagulation Profile: No results for input(s): INR, PROTIME in the last 168 hours.   Radiology Studies: CT ABDOMEN PELVIS W CONTRAST Addendum Date: 08/20/2024  EXAM: CT ABDOMEN AND PELVIS WITH CONTRAST 08/20/2024 01:43:00 AM TECHNIQUE: CT of the abdomen and pelvis was performed with the administration of 75 mL of iohexol (OMNIPAQUE) 350 MG/ML injection. Multiplanar reformatted images are provided for review. Automated exposure control, iterative reconstruction, and/or weight-based adjustment of the mA/kV was utilized to reduce the radiation dose to as low as reasonably achievable. COMPARISON: CT abd pelvis 08/01/2024. CLINICAL HISTORY: Abdominal pain, acute, nonlocalized. FINDINGS: LOWER CHEST: No acute abnormality. LIVER: Interval increase in size of multiple hepatic masses with as an example a conglomeration of masses measuring 8.9 x 7.3 cm (from 7.7 x 6.6 cm). GALLBLADDER AND BILE  DUCTS: Gallbladder is unremarkable. No biliary ductal dilatation. SPLEEN: No acute abnormality. PANCREAS: No acute abnormality. ADRENAL GLANDS: No acute abnormality. KIDNEYS, URETERS AND BLADDER: Bilateral fluid density lesions of the kidneys likely represent a simple renal cysts. Simple renal cysts do not require additional follow-up unless clinically indicated due to signs/symptoms. Per consensus, no  follow-up is needed for simple Bosniak type 1 and 2 renal cysts, unless the patient has a malignancy history or risk factors. No stones in the kidneys or ureters. No hydronephrosis. No perinephric or periureteral stranding. Urinary bladder is unremarkable. GI AND BOWEL: Similar appearing distal hypodense gastric wall thickening. There is no bowel obstruction. 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 No ascites. No free air. VASCULATURE: Filling defects throughout the vena cava and extending to the bilateral common iliac veins down to the femoral veins. Associated fat stranding along the left iliac and femoral veins suggestive of possible overlying thrombophlebitis. Recommend bilateral lower extremity ultrasound for further evaluation. Aorta is normal in caliber. Atherosclerotic plaque. LYMPH NODES: Redemonstration of mesenteric hypodense nodules likely representing lymph nodes or peritoneal deposits , as an example : stable 2.4x1.9 cm within the mid abdomen (4:37) REPRODUCTIVE ORGANS: No acute abnormality. BONES AND SOFT TISSUES: No acute osseous abnormality. No focal soft tissue abnormality. IMPRESSION: 1. 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 2. IVC filter in place. 3. Interval increase in size of multiple  hepatic masses. Peritoneal carcinomatosis along the left abdomen. Similar distal hypodense gastric wall thickening. 4. Findings discussed with Dr. Trine over the phone by Dr. Margarite on 10/19/5 at 2:22am. Electronically signed by: Kate Margarite MD 08/20/2024 02:25 AM EDT RP Workstation: [id number]   Result Date: 08/20/2024 EXAM: CT ABDOMEN AND PELVIS WITH CONTRAST 08/20/2024 01:43:00 AM TECHNIQUE: CT of the abdomen and pelvis was performed with the administration of 75 mL of iohexol (OMNIPAQUE) 350 MG/ML injection. Multiplanar reformatted images are provided for review. Automated exposure control, iterative reconstruction, and/or weight-based adjustment of the mA/kV was utilized to reduce the radiation dose to as low as reasonably achievable. COMPARISON: CT abd pelvis 08/01/2024. CLINICAL HISTORY: Abdominal pain, acute, nonlocalized. FINDINGS: LOWER CHEST: No acute abnormality. LIVER: Interval increase in size of multiple hepatic masses with as an example a conglomeration of masses measuring 8.9 x 7.3 cm (from 7.7 x 6.6 cm). GALLBLADDER AND BILE DUCTS: Gallbladder is unremarkable. No biliary ductal dilatation. SPLEEN: No acute abnormality. PANCREAS: No acute abnormality. ADRENAL GLANDS: No acute abnormality. KIDNEYS, URETERS AND BLADDER: Bilateral fluid density lesions of the kidneys likely represent a simple renal cysts. Simple renal cysts do not require additional follow-up unless clinically indicated due to signs/symptoms. Per consensus, no follow-up is needed for simple Bosniak type 1 and 2 renal cysts, unless the patient has a malignancy history or risk factors. No stones in the kidneys or ureters. No hydronephrosis. No perinephric or periureteral stranding. Urinary bladder is unremarkable. GI AND BOWEL: Similar appearing distal hypodense gastric wall thickening. There is no bowel obstruction. 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 No  ascites. No free air. VASCULATURE: Filling defects throughout the vena cava and extending to the bilateral common iliac veins down to the femoral veins. Associated fat stranding along the left iliac and femoral veins suggestive of possible overlying thrombophlebitis. Recommend bilateral lower extremity ultrasound for further evaluation. Aorta is normal in caliber. Atherosclerotic plaque. LYMPH NODES: Redemonstration of mesenteric hypodense nodules likely representing lymph nodes or peritoneal deposits , as an example : stable 2.4x1.9 cm within the mid abdomen (4:37) REPRODUCTIVE ORGANS: No acute abnormality. BONES AND SOFT TISSUES: No acute osseous abnormality. No focal soft tissue abnormality. IMPRESSION: 1.  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 2. Interval increase in size of multiple hepatic masses. Peritoneal carcinomatosis along the left abdomen. Similar distal hypodense gastric wall thickening. Electronically signed by: Morgane Naveau MD 08/20/2024 02:20 AM EDT RP Workstation: [id number]   DG FEMUR MIN 2 VIEWS LEFT Result Date: 08/20/2024 EXAM: 2 VIEW(S) XRAY OF THE LEFT FEMUR 08/20/2024 01:29:31 AM COMPARISON: None available. CLINICAL HISTORY: 00502 Leg pain 99497. LEFT LEG PAIN FINDINGS: BONES AND JOINTS: No acute fracture. No focal osseous lesion. No joint dislocation. SOFT TISSUES: The soft tissues are unremarkable. IMPRESSION: 1. Normal study. Electronically signed by: Norman Gatlin MD 08/20/2024 01:32 AM EDT RP Workstation: [id number]       Elgie Butter M.D. [facility] 08/21/2024, 7:06 PM  Available via Epic secure chat 7am-7pm After 7 pm, please refer to night coverage provider listed on amion.

## 2024-08-21 NOTE — Progress Notes (Signed)
 PHARMACY - ANTICOAGULATION  Pharmacy Consult for Heparin  Indication:  LE thrombus and recent DVT/PE Brief A/P: Heparin level subtherapeutic Increase Heparin rate  Allergies  Allergen Reactions   Elemental Sulfur Hives   Shellfish Allergy Anaphylaxis   Shellfish Allergy    Sulfa Antibiotics Hives    Patient Measurements: Height: 5' 5 (165.1 cm) Weight: 54.5 kg (120 lb 2.4 oz) IBW/kg (Calculated) : 57 HEPARIN DW (KG): 54.5  Vital Signs: Temp: 98 F (36.7 C) (10/20 0819) Temp Source: Oral (10/20 0819) BP: 139/70 (10/20 0819) Pulse Rate: 102 (10/20 0819)  Labs: Recent Labs    08/19/24 1606 08/20/24 1200 08/20/24 1912 08/21/24 0021 08/21/24 0916  HGB 9.7*  --  9.7*  --  8.8*  HCT 31.7*  --  31.6*  --  27.9*  PLT 412*  --  494*  --  506*  HEPARINUNFRC  --  0.15*  --  0.23* 0.36  CREATININE 1.01*  --  0.86  --  0.83    Estimated Creatinine Clearance: 45 mL/min (by C-G formula based on SCr of 0.83 mg/dL).  Assessment: 83 y.o. female with LE thrombus continues on heparin  Goal of Therapy:  Heparin level 0.3-0.5 units/ml Monitor platelets by anticoagulation protocol: Yes   Plan:  Increase heparin drip to 950 units/hr Follow-up am labs.  Thank you. Olam Monte, PharmD

## 2024-08-21 NOTE — Progress Notes (Addendum)
 Tiffany [name]   DOB:1940-11-24   FM#:[id number]      ASSESSMENT & PLAN:  Tiffany [name] is an 83 year old female patient with oncologic history significant for metastatic gastric adenocarcinoma.  Presented to ED on 9/29 with functional deficits due to stroke.  Readmitted 10/19 due to complaints of LE pain and swelling. Medical Oncology/Dr. Lanny following.    Adenocarcinoma, likely gastric with liver and peritoneal mets --Imaging 08/01/2024 shows multiple hepatic masses consistent with mets.  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  Peritoneal mets in LUQ noted with small pelvic ascites. -- Tumor markers; CEA elevated 326.  CA19-9 WNL.   -- Path of liver lesions confirms metastatic moderately differentiated adenocarcinoma, likely gastric primary -- Patient made DNR-Comfort care.  -- Medical Oncology/Dr. Lanny following closely.   History of PE, RUL and RLL Bil LE DVTs LLE swelling and pain -- RLE DVT seen on duplex 9/9. Had been started on Eliquis . -- Status post IVC filter placement 10/3. Bil LE DVTs to level of IVC filter -- Was on low dose Lovenox  40 mg after discharge earlier this month.  -- IV heparin infusing well today; for transition to DOAC. -- Followed by Vasc Surgery.  -- Monitor closely for bleeding   Embolic stroke Aphasia, improving - Patient now responding with 2 to 3 word responses. -- Recent large left infarct and multiple smaller infarcts.  - Continue supportive care   Anemia -- Hemoglobin 8.8   - Likely due to malignancy. -- DNR-Comfort care   Thrombocytosis -- Likely reactive -- Platelets 506K -- Patient is DNR-comfort care, no need to repeat labs      Code Status DNR-Comfort  Subjective:  Patient seen sleeping comfortably.  Did not awaken to verbal stimuli however woke up to tactile.  Minimal verbal responses, nodded when asked if she had pain and pointed to her legs.  No other acute distress noted.  No family at bedside.   Objective:   Intake/Output Summary  (Last 24 hours) at 08/21/2024 1330 Last data filed at 08/21/2024 1251 Gross per 24 hour  Intake 323.01 ml  Output 0 ml  Net 323.01 ml     PHYSICAL EXAMINATION: ECOG PERFORMANCE STATUS: 4 - Bedbound  Vitals:   08/21/24 0430 08/21/24 0819  BP: 114/63 139/70  Pulse: 78 (!) 102  Resp: 16 18  Temp: 98.2 F (36.8 C) 98 F (36.7 C)  SpO2: 98% 98%   Filed Weights   08/20/24 0255 08/20/24 2018  Weight: 120 lb (54.4 kg) 120 lb 2.4 oz (54.5 kg)    GENERAL: +chronically ill-appearing SKIN: skin color, texture, turgor are normal, no rashes or significant lesions EYES: normal, conjunctiva are pink and non-injected, sclera clear OROPHARYNX: no exudate, no erythema and lips, buccal mucosa, and tongue normal  NECK: supple, thyroid normal size, non-tender, without nodularity LYMPH: no palpable lymphadenopathy in the cervical, axillary or inguinal LUNGS: clear to auscultation and percussion with normal breathing effort HEART: regular rate & rhythm and no murmurs and no lower extremity edema ABDOMEN: abdomen soft, non-tender and normal bowel sounds MUSCULOSKELETAL: no cyanosis of digits and no clubbing  PSYCH: +minimal verbal responses    All questions were answered. The patient knows to call the clinic with any problems, questions or concerns.   The total time spent in the appointment was 40 minutes encounter with patient including review of chart and various tests results, discussions about plan of care and coordination of care plan  Olam JINNY Brunner, NP 08/21/2024 1:30 PM    Labs Reviewed:  Lab Results  Component Value Date   WBC 12.6 (H) 08/21/2024   HGB 8.8 (L) 08/21/2024   HCT 27.9 (L) 08/21/2024   MCV 82.8 08/21/2024   PLT 506 (H) 08/21/2024   Recent Labs    07/24/24 0217 07/25/24 0434 08/01/24 0527 08/03/24 0538 08/19/24 1606 08/20/24 1912 08/21/24 0916  NA 138   < > 139   < > 139 140 141  K 3.8   < > 3.7   < > 3.6 3.4* 3.3*  CL 105   < > 105   < > 105 105 105   CO2 20*   < > 21*   < > 19* 21* 21*  GLUCOSE 93   < > 91   < > 136* 85 93  BUN 15   < > 18   < > 26* 22 19  CREATININE 0.88   < > 0.85   < > 1.01* 0.86 0.83  CALCIUM 8.2*   < > 8.6*   < > 8.8* 8.6* 8.6*  GFRNONAA >60   < > >60   < > 56* >60 >60  PROT 6.5  --  6.2*  --  7.1  --   --   ALBUMIN 2.3*  --  2.1*  --  2.2*  --   --   AST 23  --  37  --  30  --   --   ALT 15  --  28  --  22  --   --   ALKPHOS 141*  --  171*  --  286*  --   --   BILITOT 1.2  --  0.5  --  0.8  --   --    < > = values in this interval not displayed.    Studies Reviewed:  Lower Venous DVT Study 07/25/2024  CT ABD and PEL 08/20/24:  Tiffany [name] is an 83 year old female patient with oncologic history significant for metastatic gastric adenocarcinoma.  She presented to ED on 9/29 with functional deficits due to stroke.  Medical Oncology/Dr. Lanny following.   Addendum I have seen the patient, examined her. I agree with the assessment and and plan and have edited the notes.   Pt was discharged 10 days ago and readmitted yesterday for worsening bilateral lower extremity DVT up to the IVC filter.  She is not a candidate for thrombolysis.  Her PS remains to be low, she still has significant cognitive deficit and dysphagia.  I reviewed the incurable nature of her metastatic malignancy, she is not a candidate for cancer treatment due to her poor PS.  I recommend home hospice, I discussed the logistics, benefits and limitation of home hospice service, her 2 daughters all agreed with home hospice on discharge.I will refer her to [facility], and I hope the coordinator will meet them tomorrow. OK to switch heparin to DOAC, such as Eliquis .  Due to her previous GI bleeding and significant anemia, will use slightly reduced dose, such as Eliquis  5mg  in the morning and 2.5 mg in the evening. I will f/u as needed.  Onita Lanny MD 08/21/2024

## 2024-08-21 NOTE — TOC Initial Note (Signed)
 Transition of Care [facility]) - Initial/Assessment Note    Patient Details  Name: Tiffany [name] MRN: [id number] Date of Birth: November 27, 1940  [facility] of [facility]) CM/SW Contact:    Lendia Dais, LCSWA Phone Number: 08/21/2024, 4:19 PM  Clinical Narrative: Pt is disoriented x1, CSW spoke to daughter Tiffany [name] via phone.  Pt is from home with Tiffany [name] and needs assist with most ADL's. Tiffany [name] reports the patient is able to feed herself only. Pt affords basic needs through [employer] and has seen her PCP in last year.  Pt takes meds as prescribed and does not have a HCPOA. Tiffany [name] stated that her or her family will be able to provide transportation from the Hospital.   No current ICM (inpatient care management) needs at this time. Please place consult for needs.                   Expected Discharge Plan: Home/Self Care Barriers to Discharge: Continued Medical Work up   Patient Goals and CMS Choice Patient states their goals for this hospitalization and ongoing recovery are:: Unable to assess   Choice offered to / list presented to : NA      Expected Discharge Plan and Services In-house Referral: Clinical Social Work     Living arrangements for the past 2 months: Single Family Home                                      Prior Living Arrangements/Services Living arrangements for the past 2 months: Single Family Home Lives with:: Adult Children Patient language and need for interpreter reviewed:: Yes Do you feel safe going back to the place where you live?:  (Unable to assess)      Need for Family Participation in Patient Care: Yes (Comment) Care giver support system in place?: Yes (comment) Current home services: DME Criminal Activity/Legal Involvement Pertinent to Current Situation/Hospitalization: No - Comment as needed  Activities of Daily Living      Permission Sought/Granted Permission sought to share information with : Family Supports Permission granted to share  information with : No (Pt is disoriented x1)  Share Information with NAME: Tiffany [name]     Permission granted to share info w Relationship: Daughter  Permission granted to share info w Contact Information: [phone]  Emotional Assessment Appearance:: Appears stated age Attitude/Demeanor/Rapport: Unable to Assess Affect (typically observed): Unable to Assess Orientation: : Oriented to Self Alcohol / Substance Use: Not Applicable Psych Involvement: No (comment)  Admission diagnosis:  Pulmonary embolism (HCC) [I26.99] DVT (deep venous thrombosis) (HCC) [I82.409] IVC thrombosis (HCC) [I82.220] Acute bilateral deep vein thrombosis (DVT) of femoral veins (HCC) [I82.413] Patient Active Problem List   Diagnosis Date Noted   Pulmonary embolism (HCC) 08/20/2024   DVT (deep venous thrombosis) (HCC) 08/20/2024   Cancer, 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) 08/14/2024   Acute pulmonary embolism (HCC) 08/10/2024   At risk for dehydration due to poor fluid intake 08/10/2024   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 [place] [address] Phone: [phone] Fax: [phone]     Social Drivers of Health (SDOH) Social History: SDOH Screenings   Food Insecurity: Patient Unable To Answer (08/21/2024)  Housing: Patient Unable To Answer (08/21/2024)  Transportation Needs: Patient Unable To Answer (08/21/2024)  Utilities: Patient Unable To Answer (08/21/2024)   Social Connections: Patient Unable To Answer (08/21/2024)  Tobacco Use: Low Risk  (08/19/2024)   SDOH Interventions: Transportation Interventions: Inpatient TOC   Readmission Risk Interventions    08/21/2024    4:09 PM  Readmission Risk Prevention Plan  Transportation Screening Complete  PCP or Specialist Appt within 5-7 Days Complete  Home Care Screening Complete  Medication Review (RN CM) Referral to Pharmacy

## 2024-08-21 NOTE — Telephone Encounter (Signed)
 Pharmacy Patient Advocate Encounter  Insurance verification completed.    The patient is insured through [employer]. Patient has Medicare and is not eligible for a copay card, but may be able to apply for patient assistance or Medicare RX Payment Plan (Patient Must reach out to their plan, if eligible for payment plan), if available.    Ran test claim for Eliquis  5mg  and the current 30 day co-pay is $47.00.   This test claim was processed through [facility]- copay amounts may vary at other pharmacies due to pharmacy/plan contracts, or as the patient moves through the different stages of their insurance plan.

## 2024-08-21 NOTE — Progress Notes (Addendum)
  Progress Note    08/21/2024 8:33 AM * No surgery found *  Subjective:  legs somewhat sore but not really bothering her   Vitals:   08/21/24 0430 08/21/24 0819  BP: 114/63 139/70  Pulse: 78 (!) 102  Resp: 16 18  Temp: 98.2 F (36.8 C) 98 F (36.7 C)  SpO2: 98% 98%   Physical Exam: Cardiac:  regular Lungs:  non labored Extremities:  2+ DP pulses bilaterally, mild edema bilaterally. Feet warm and well perfused Abdomen:  soft Neurologic: alert and oriented  CBC    Component Value Date/Time   WBC 14.5 (H) 08/20/2024 1912   RBC 3.72 (L) 08/20/2024 1912   HGB 9.7 (L) 08/20/2024 1912   HCT 31.6 (L) 08/20/2024 1912   PLT 494 (H) 08/20/2024 1912   MCV 84.9 08/20/2024 1912   MCH 26.1 08/20/2024 1912   MCHC 30.7 08/20/2024 1912   RDW 19.5 (H) 08/20/2024 1912   LYMPHSABS 2.5 08/20/2024 1912   MONOABS 1.8 (H) 08/20/2024 1912   EOSABS 0.1 08/20/2024 1912   BASOSABS 0.1 08/20/2024 1912    BMET    Component Value Date/Time   NA 140 08/20/2024 1912   K 3.4 (L) 08/20/2024 1912   CL 105 08/20/2024 1912   CO2 21 (L) 08/20/2024 1912   GLUCOSE 85 08/20/2024 1912   BUN 22 08/20/2024 1912   CREATININE 0.86 08/20/2024 1912   CALCIUM 8.6 (L) 08/20/2024 1912   GFRNONAA >60 08/20/2024 1912    INR    Component Value Date/Time   INR 1.3 (H) 08/04/2024 0453     Intake/Output Summary (Last 24 hours) at 08/21/2024 9166 Last data filed at 08/21/2024 0600 Gross per 24 hour  Intake 203.01 ml  Output 0 ml  Net 203.01 ml     Assessment/Plan:  83 y.o. female  Bilateral lower extremity DVT's extending to level of IVC filter.  Her IVC thrombosis up to the filter would best be treated with thrombolysis but she is not a candidate given recent large MCA territory infarct as well as metastatic adenocarcinoma that would put her at exceedingly high risk for complication and bleed  Continue conservative management with anticoagulation Compression stockings ordered for BLE Will need  transitioned to DOAC on d/c Vascular will be available as needed  DVT prophylaxis:  Heparin IV   Corrina Baglia, PA-C [facility] [phone] 08/21/2024 8:33 AM  I have seen and evaluated the patient. I agree with the PA note as documented above.  She does have some notable swelling to the left lower extremity but not overly tense.  Bilateral lower extremity DVTs with thrombus up to the IVC filter that was just placed during last admission.  This would best be treated with thrombolysis with bilateral thrombolytic catheter placement in popliteal veins.  Not a good candidate given recent large MCA infarct as well as metastatic adenocarcinoma with liver mets putting her at very high risk for bleed and complication from TPA.  I think anticoagulation alone would be reasonable with leg elevation and compression as we discussed.  Looks like she is now DNR comfort and they would like to discuss goals of care with the oncologist.  Call vascular with questions or concerns.  Lonni DOROTHA Gaskins, MD [facility] [place] Office: [phone]

## 2024-08-21 NOTE — TOC CM/SW Note (Signed)
 Transition of Care [facility]) - Inpatient Brief Assessment   Patient Details  Name: Tiffany [name] MRN: [id number] Date of Birth: Aug 15, 1941  [facility] CM/SW Contact:    Tom-Johnson, Harvest Muskrat, RN Phone Number: 08/21/2024, 4:10 PM   Clinical Narrative:  Patient presented to the ED with worsening Lt Leg  Swelling and Pain. 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 Patient has hx of Metastatic Adenocarcinoma, PE, DVT s/p IVC Filter placement, Anemia, HTN, Lt Occipitotemporal Infarct, Dysphagia.    Patient transitioned to Comfort Care, Palliative following. CM will continue to follow during this transitioning time.     Transition of Care Asessment: Insurance and Status: Insurance coverage has been reviewed Patient has primary care physician: Yes Home environment has been reviewed: Yes Prior level of function:: Modified Independent Prior/Current Home Services: No current home services Social Drivers of Health Review: SDOH reviewed no interventions necessary Readmission risk has been reviewed: Yes Transition of care needs: transition of care needs identified, TOC will continue to follow

## 2024-08-21 NOTE — Progress Notes (Signed)
 PHARMACY - ANTICOAGULATION  Pharmacy Consult for Heparin  Indication:  LE thrombus and recent DVT/PE Brief A/P: Heparin level subtherapeutic Increase Heparin rate  Allergies  Allergen Reactions   Elemental Sulfur Hives   Shellfish Allergy Anaphylaxis   Shellfish Allergy    Sulfa Antibiotics Hives    Patient Measurements: Height: 5' 5 (165.1 cm) Weight: 54.5 kg (120 lb 2.4 oz) IBW/kg (Calculated) : 57 HEPARIN DW (KG): 54.5  Vital Signs: Temp: 99.4 F (37.4 C) (10/19 2018) Temp Source: Oral (10/19 2018) BP: 135/63 (10/19 2018) Pulse Rate: 100 (10/19 2018)  Labs: Recent Labs    08/19/24 1606 08/20/24 1200 08/20/24 1912 08/21/24 0021  HGB 9.7*  --  9.7*  --   HCT 31.7*  --  31.6*  --   PLT 412*  --  494*  --   HEPARINUNFRC  --  0.15*  --  0.23*  CREATININE 1.01*  --  0.86  --     Estimated Creatinine Clearance: 43.4 mL/min (by C-G formula based on SCr of 0.86 mg/dL).  Assessment: 83 y.o. female with LE thrombus for heparin  Goal of Therapy:  Heparin level 0.3-0.5 units/ml Monitor platelets by anticoagulation protocol: Yes   Plan:  Increase heparin drip to 900 units/hr Follow-up am labs.  Cathlyn Arrant, PharmD, BCPS

## 2024-08-22 ENCOUNTER — Inpatient Hospital Stay

## 2024-08-22 ENCOUNTER — Inpatient Hospital Stay: Admitting: Nurse Practitioner

## 2024-08-22 DIAGNOSIS — I82413 Acute embolism and thrombosis of femoral vein, bilateral: Secondary | ICD-10-CM | POA: Diagnosis not present

## 2024-08-22 DIAGNOSIS — I82220 Acute embolism and thrombosis of inferior vena cava: Secondary | ICD-10-CM | POA: Diagnosis not present

## 2024-08-22 LAB — CBC
HCT: 27.9 % — ABNORMAL LOW (ref 36.0–46.0)
Hemoglobin: 8.8 g/dL — ABNORMAL LOW (ref 12.0–15.0)
MCH: 26 pg (ref 26.0–34.0)
MCHC: 31.5 g/dL (ref 30.0–36.0)
MCV: 82.3 fL (ref 80.0–100.0)
Platelets: 498 K/uL — ABNORMAL HIGH (ref 150–400)
RBC: 3.39 MIL/uL — ABNORMAL LOW (ref 3.87–5.11)
RDW: 19.2 % — ABNORMAL HIGH (ref 11.5–15.5)
WBC: 12.2 K/uL — ABNORMAL HIGH (ref 4.0–10.5)
nRBC: 0 % (ref 0.0–0.2)

## 2024-08-22 LAB — HEPARIN LEVEL (UNFRACTIONATED): Heparin Unfractionated: 0.35 [IU]/mL (ref 0.30–0.70)

## 2024-08-22 MED ORDER — APIXABAN 2.5 MG PO TABS
2.5000 mg | ORAL_TABLET | Freq: Every evening | ORAL | Status: DC
Start: 2024-08-22 — End: 2024-08-24
  Administered 2024-08-22 – 2024-08-24 (×2): 2.5 mg via ORAL
  Filled 2024-08-22 (×2): qty 1

## 2024-08-22 MED ORDER — APIXABAN 5 MG PO TABS
5.0000 mg | ORAL_TABLET | Freq: Every morning | ORAL | Status: DC
  Administered 2024-08-22 – 2024-08-24 (×3): 5 mg via ORAL
  Filled 2024-08-22 (×3): qty 1

## 2024-08-22 NOTE — Discharge Instructions (Signed)
 Information on my medicine - ELIQUIS  (apixaban )  Why was Eliquis  prescribed for you? Eliquis  was prescribed to treat blood clots that may have been found in the veins of your legs (deep vein thrombosis) or in your lungs (pulmonary embolism) and to reduce the risk of them occurring again.  What do You need to know about Eliquis  ? The dose ONE 5 mg tablet taken in the morning and 2.5 mg taken in the evening.  Eliquis  may be taken with or without food.   Try to take the dose about the same time in the morning and in the evening. If you have difficulty swallowing the tablet whole please discuss with your pharmacist how to take the medication safely.  Take Eliquis  exactly as prescribed and DO NOT stop taking Eliquis  without talking to the doctor who prescribed the medication.  Stopping may increase your risk of developing a new blood clot.  Refill your prescription before you run out.  After discharge, you should have regular check-up appointments with your healthcare provider that is prescribing your Eliquis .    What do you do if you miss a dose? If a dose of ELIQUIS  is not taken at the scheduled time, take it as soon as possible on the same day and twice-daily administration should be resumed. The dose should not be doubled to make up for a missed dose.  Important Safety Information A possible side effect of Eliquis  is bleeding. You should call your healthcare provider right away if you experience any of the following: Bleeding from an injury or your nose that does not stop. Unusual colored urine (red or dark brown) or unusual colored stools (red or black). Unusual bruising for unknown reasons. A serious fall or if you hit your head (even if there is no bleeding).  Some medicines may interact with Eliquis  and might increase your risk of bleeding or clotting while on Eliquis . To help avoid this, consult your healthcare provider or pharmacist prior to using any new prescription or  non-prescription medications, including herbals, vitamins, non-steroidal anti-inflammatory drugs (NSAIDs) and supplements.  This website has more information on Eliquis  (apixaban ): [email or web address] .com/eliquis dena

## 2024-08-22 NOTE — Progress Notes (Deleted)
 Subjective:    Patient ID: Tiffany [name], female    DOB: [birth year], 83 y.o.   MRN: [id number]  HPI   Pain Inventory Average Pain {NUMBERS; 0-10:5044} Pain Right Now {NUMBERS; 0-10:5044} My pain is {PAIN DESCRIPTION:21022940}  LOCATION OF PAIN  ***  BOWEL Number of stools per week: *** Oral laxative use {YES/NO:21197} Type of laxative *** Enema or suppository use {YES/NO:21197} History of colostomy {YES/NO:21197} Incontinent {YES/NO:21197}  BLADDER {bladder options:24190} In and out cath, frequency *** Able to self cath {YES/NO:21197} Bladder incontinence {YES/NO:21197} Frequent urination {YES/NO:21197} Leakage with coughing {YES/NO:21197} Difficulty starting stream {YES/NO:21197} Incomplete bladder emptying {YES/NO:21197}   Mobility {MOBILITY JIO:78977055}  Function {FUNCTION:21022946}  Neuro/Psych {NEURO/PSYCH:21022948}  Prior Studies {CPRM PRIOR STUDIES:21022953}  Physicians involved in your care {CPRM PHYSICIANS INVOLVED IN YOUR CARE:21022954}   Family History  Problem Relation Age of Onset   Breast cancer Sister    Social History   Socioeconomic History   Marital status: Divorced    Spouse name: Not on file   Number of children: Not on file   Years of education: Not on file   Highest education level: Not on file  Occupational History   Not on file  Tobacco Use   Smoking status: Never    Passive exposure: Never   Smokeless tobacco: Never  Vaping Use   Vaping status: Never Used  Substance and Sexual Activity   Alcohol use: Not Currently   Drug use: Never   Sexual activity: Not on file  Other Topics Concern   Not on file  Social History Narrative   ** Merged History Encounter **       Social Drivers of Health   Financial Resource Strain: Not on file  Food Insecurity: Patient Unable To Answer (08/21/2024)   Hunger Vital Sign    Worried About Running Out of Food in the Last Year: Patient unable to answer    Ran Out of Food in  the Last Year: Patient unable to answer  Transportation Needs: Patient Unable To Answer (08/21/2024)   PRAPARE - Transportation    Lack of Transportation (Medical): Patient unable to answer    Lack of Transportation (Non-Medical): Patient unable to answer  Physical Activity: Not on file  Stress: Not on file  Social Connections: Patient Unable To Answer (08/21/2024)   Social Connection and Isolation Panel    Frequency of Communication with Friends and Family: Patient unable to answer    Frequency of Social Gatherings with Friends and Family: Patient unable to answer    Attends Religious Services: Patient unable to answer    Active Member of Clubs or Organizations: Patient unable to answer    Attends Banker Meetings: Patient unable to answer    Marital Status: Patient unable to answer   Past Surgical History:  Procedure Laterality Date   IR IVC FILTER PLMT / S&I /IMG GUID/MOD SED  08/04/2024   IR US  LIVER BIOPSY  08/04/2024   Past Medical History:  Diagnosis Date   Cancer (HCC)    DVT of deep femoral vein, right (HCC)    Hypertension    Stroke (HCC)    There were no vitals taken for this visit.  Opioid Risk Score:   Fall Risk Score:  `1  Depression screen PHQ 2/9      No data to display          Review of Systems     Objective:   Physical Exam        Assessment &  Plan:

## 2024-08-22 NOTE — Progress Notes (Signed)
 [facility]                                                                               Tiffany [name], is a 83 y.o. female, DOB - 1941/04/25, FMW:[id number] Admit date - 08/19/2024    Outpatient Primary MD for the patient is Benjamine Aland, MD  LOS - 2  days    Brief summary    Tiffany [name] is a 83 y.o. 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  Vascular surgery consulted by EDP. She was also started on IV heparin.  She was referred to [facility] for admission.   Assessment & Plan    Worsening DVT in bilateral lower extremities extending to the filter She presents with worsening leg pain and leg swelling despite lovenox .  Currently on IV heparin, transitioned to eliquis .  Vascular surgery consulted and suggested patient is not a candidate for thrombectomy due to her recent history of CVA.  Oncology consulted for goals of care discussions, she can be discharged home with hospice once its set up.    Metastatic adeno ca , possibly primary is gastric.  Further management as per oncology.    MCA infarct Resume statin.    GERD Continue with protonix.   Fever overnight  Suspect from extensive DVT.    Hypokalemia Replaced.    Leukocytosis slowly improving.    Estimated body  mass index is 19.99 kg/m as calculated from the following:   Height as of this encounter: 5' 5 (1.651 m).   Weight as of this encounter: 54.5 kg.  Code Status: DNR  DVT Prophylaxis:  apixaban  (ELIQUIS ) tablet 2.5 mg Start: 08/22/24 1800heparin.  apixaban  (ELIQUIS ) tablet 5 mg  apixaban  (ELIQUIS ) tablet 2.5 mg   Level of Care: Level of care: Telemetry Medical Family Communication: none at bedside.   Disposition Plan:     Remains inpatient appropriate:  home tomorrow with hospice once set up   Procedures:  None.   Consultants:   Oncology Vascular surgery.   Antimicrobials:   Anti-infectives (From admission, onward)    None        Medications  Scheduled Meds:  acetaminophen   500 mg Oral BID   apixaban   5 mg Oral q AM   And   apixaban   2.5 mg Oral QPM   ascorbic acid  500 mg Oral Daily   atorvastatin  20 mg Oral Daily   dorzolamide  1 drop Both Eyes BID   feeding supplement  237 mL Oral BID BM   ferrous sulfate  325 mg Oral Q breakfast   latanoprost  1 drop  Both Eyes QHS   metoprolol tartrate  25 mg Oral Daily   mirtazapine  15 mg Oral QHS   pantoprazole  40 mg Oral BID   potassium chloride  20 mEq Oral BID   predniSONE  2.5 mg Oral Daily   Continuous Infusions:   PRN Meds:.    Subjective:   [name] was seen and examined today.  No new complaints today. Cheerful.   Objective:   Vitals:   08/21/24 1702 08/21/24 2128 08/22/24 0727 08/22/24 1634  BP: 132/89 128/75 129/69 121/83  Pulse: 93  (!) 102 88  Resp: 19  18 18   Temp: 98.5 F (36.9 C) 98.4 F (36.9 C) 99 F (37.2 C) 98.1 F (36.7 C)  TempSrc: Oral  Oral Oral  SpO2: 99% 97% 98% 99%  Weight:      Height:        Intake/Output Summary (Last 24 hours) at 08/22/2024 1807 Last data filed at 08/22/2024 0700 Gross per 24 hour  Intake 120 ml  Output --  Net 120 ml   Filed Weights   08/20/24 0255 08/20/24 2018  Weight: 54.4 kg 54.5 kg     Exam General exam: Appears calm and  comfortable  Respiratory system: Clear to auscultation. Respiratory effort normal. Cardiovascular system: S1 & S2 heard, RRR.  Gastrointestinal system: Abdomen is nondistended, soft and nontender.  Central nervous system: Alert and oriented. No focal neurological deficits. Extremities: bilateral leg swelling.    Data Reviewed:  I have personally reviewed following labs and imaging studies   CBC Lab Results  Component Value Date   WBC 12.2 (H) 08/22/2024   RBC 3.39 (L) 08/22/2024   HGB 8.8 (L) 08/22/2024   HCT 27.9 (L) 08/22/2024   MCV 82.3 08/22/2024   MCH 26.0 08/22/2024   PLT 498 (H) 08/22/2024   MCHC 31.5 08/22/2024   RDW 19.2 (H) 08/22/2024   LYMPHSABS 2.5 08/20/2024   MONOABS 1.8 (H) 08/20/2024   EOSABS 0.1 08/20/2024   BASOSABS 0.1 08/20/2024     Last metabolic panel Lab Results  Component Value Date   NA 141 08/21/2024   K 3.3 (L) 08/21/2024   CL 105 08/21/2024   CO2 21 (L) 08/21/2024   BUN 19 08/21/2024   CREATININE 0.83 08/21/2024   GLUCOSE 93 08/21/2024   GFRNONAA >60 08/21/2024   CALCIUM 8.6 (L) 08/21/2024   PHOS 3.9 10/18/2023   PROT 7.1 08/19/2024   ALBUMIN 2.2 (L) 08/19/2024   BILITOT 0.8 08/19/2024   ALKPHOS 286 (H) 08/19/2024   AST 30 08/19/2024   ALT 22 08/19/2024   ANIONGAP 15 08/21/2024    CBG (last 3)  No results for input(s): GLUCAP in the last 72 hours.    Coagulation Profile: No results for input(s): INR, PROTIME in the last 168 hours.   Radiology Studies: CT ABDOMEN PELVIS W CONTRAST Addendum Date: 08/20/2024  EXAM: CT ABDOMEN AND PELVIS WITH CONTRAST 08/20/2024 01:43:00 AM TECHNIQUE: CT of the abdomen and pelvis was performed with the administration of 75 mL of iohexol (OMNIPAQUE) 350 MG/ML injection. Multiplanar reformatted images are provided for review. Automated exposure control, iterative reconstruction, and/or weight-based adjustment of the mA/kV was utilized to reduce the radiation dose to as low as reasonably  achievable. COMPARISON: CT abd pelvis 08/01/2024. CLINICAL HISTORY: Abdominal pain, acute, nonlocalized. FINDINGS: LOWER CHEST: No acute abnormality. LIVER: Interval increase in size of multiple hepatic masses with as an example a conglomeration of masses measuring 8.9 x 7.3 cm (from 7.7 x 6.6  cm). GALLBLADDER AND BILE DUCTS: Gallbladder is unremarkable. No biliary ductal dilatation. SPLEEN: No acute abnormality. PANCREAS: No acute abnormality. ADRENAL GLANDS: No acute abnormality. KIDNEYS, URETERS AND BLADDER: Bilateral fluid density lesions of the kidneys likely represent a simple renal cysts. Simple renal cysts do not require additional follow-up unless clinically indicated due to signs/symptoms. Per consensus, no follow-up is needed for simple Bosniak type 1 and 2 renal cysts, unless the patient has a malignancy history or risk factors. No stones in the kidneys or ureters. No hydronephrosis. No perinephric or periureteral stranding. Urinary bladder is unremarkable. GI AND BOWEL: Similar appearing distal hypodense gastric wall thickening. There is no bowel obstruction. 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 No ascites. No free air. VASCULATURE: Filling defects throughout the vena cava and extending to the bilateral common iliac veins down to the femoral veins. Associated fat stranding along the left iliac and femoral veins suggestive of possible overlying thrombophlebitis. Recommend bilateral lower extremity ultrasound for further evaluation. Aorta is normal in caliber. Atherosclerotic plaque. LYMPH NODES: Redemonstration of mesenteric hypodense nodules likely representing lymph nodes or peritoneal deposits , as an example : stable 2.4x1.9 cm within the mid abdomen (4:37) REPRODUCTIVE ORGANS: No acute abnormality. BONES AND SOFT TISSUES: No acute osseous abnormality. No focal soft tissue abnormality. IMPRESSION: 1. Extensive thrombus throughout  the inferior vena cava extending into the bilateral common iliac and femoral veins, with left iliac and femoral venous fat stranding suspicious for superimposed thrombophlebitis; recommend bilateral lower extremity ultrasound for further evaluation. 2. IVC filter in place. 3. Interval increase in size of multiple hepatic masses. Peritoneal carcinomatosis along the left abdomen. Similar distal hypodense gastric wall thickening. 4. Findings discussed with Dr. Trine over the phone by Dr. Margarite on 10/19/5 at 2:22am. Electronically signed by: Kate Margarite MD 08/20/2024 02:25 AM EDT RP Workstation: [id number]   Result Date: 08/20/2024 EXAM: CT ABDOMEN AND PELVIS WITH CONTRAST 08/20/2024 01:43:00 AM TECHNIQUE: CT of the abdomen and pelvis was performed with the administration of 75 mL of iohexol (OMNIPAQUE) 350 MG/ML injection. Multiplanar reformatted images are provided for review. Automated exposure control, iterative reconstruction, and/or weight-based adjustment of the mA/kV was utilized to reduce the radiation dose to as low as reasonably achievable. COMPARISON: CT abd pelvis 08/01/2024. CLINICAL HISTORY: Abdominal pain, acute, nonlocalized. FINDINGS: LOWER CHEST: No acute abnormality. LIVER: Interval increase in size of multiple hepatic masses with as an example a conglomeration of masses measuring 8.9 x 7.3 cm (from 7.7 x 6.6 cm). GALLBLADDER AND BILE DUCTS: Gallbladder is unremarkable. No biliary ductal dilatation. SPLEEN: No acute abnormality. PANCREAS: No acute abnormality. ADRENAL GLANDS: No acute abnormality. KIDNEYS, URETERS AND BLADDER: Bilateral fluid density lesions of the kidneys likely represent a simple renal cysts. Simple renal cysts do not require additional follow-up unless clinically indicated due to signs/symptoms. Per consensus, no follow-up is needed for simple Bosniak type 1 and 2 renal cysts, unless the patient has a malignancy history or risk factors. No stones in the kidneys or  ureters. No hydronephrosis. No perinephric or periureteral stranding. Urinary bladder is unremarkable. GI AND BOWEL: Similar appearing distal hypodense gastric wall thickening. There is no bowel obstruction. 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 No ascites. No free air. VASCULATURE: Filling defects throughout the vena cava and extending to the bilateral common iliac veins down to the femoral veins. Associated fat stranding along the left iliac and femoral veins suggestive of possible overlying thrombophlebitis. Recommend bilateral lower  extremity ultrasound for further evaluation. Aorta is normal in caliber. Atherosclerotic plaque. LYMPH NODES: Redemonstration of mesenteric hypodense nodules likely representing lymph nodes or peritoneal deposits , as an example : stable 2.4x1.9 cm within the mid abdomen (4:37) REPRODUCTIVE ORGANS: No acute abnormality. BONES AND SOFT TISSUES: No acute osseous abnormality. No focal soft tissue abnormality. IMPRESSION: 1. 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 2. Interval increase in size of multiple hepatic masses. Peritoneal carcinomatosis along the left abdomen. Similar distal hypodense gastric wall thickening. Electronically signed by: Morgane Naveau MD 08/20/2024 02:20 AM EDT RP Workstation: [id number]   DG FEMUR MIN 2 VIEWS LEFT Result Date: 08/20/2024 EXAM: 2 VIEW(S) XRAY OF THE LEFT FEMUR 08/20/2024 01:29:31 AM COMPARISON: None available. CLINICAL HISTORY: 00502 Leg pain 99497. LEFT LEG PAIN FINDINGS: BONES AND JOINTS: No acute fracture. No focal osseous lesion. No joint dislocation. SOFT TISSUES: The soft tissues are unremarkable. IMPRESSION: 1. Normal study.  Electronically signed by: Norman Gatlin MD 08/20/2024 01:32 AM EDT RP Workstation: [id number]       Elgie Butter M.D. [facility] 08/22/2024, 6:07 PM  Available via Epic secure chat 7am-7pm After 7 pm, please refer to night coverage provider listed on amion.

## 2024-08-22 NOTE — Progress Notes (Signed)
 [facility] Liaison Note  Received a request from Rayfield Gobble, Transitions of Care Manager, for hospice services at home after discharge.  Spoke with Noella, daughter to initiate education related to hospice philosophy, services and team approach to care.  Noella verbalized understanding of information given and wishes to call liaison back once she has had time to make plans.  [facility] information and contact numbers given to Noella. Above information shared with Kristi.  Please call with any questions or concerns.  Thank you for the opportunity to participate in this patient's care.  Inocente Jacobs BSN RN [facility] Liaison [phone]

## 2024-08-22 NOTE — Progress Notes (Signed)
 PHARMACY - ANTICOAGULATION  Pharmacy Consult for Heparin  Indication:  LE thrombus and recent DVT/PE Brief A/P: Heparin level subtherapeutic Increase Heparin rate  Allergies  Allergen Reactions   Elemental Sulfur Hives   Shellfish Allergy Anaphylaxis   Shellfish Allergy    Sulfa Antibiotics Hives    Patient Measurements: Height: 5' 5 (165.1 cm) Weight: 54.5 kg (120 lb 2.4 oz) IBW/kg (Calculated) : 57 HEPARIN DW (KG): 54.5  Vital Signs: Temp: 99 F (37.2 C) (10/21 0727) Temp Source: Oral (10/21 0727) BP: 129/69 (10/21 0727) Pulse Rate: 102 (10/21 0727)  Labs: Recent Labs    08/19/24 1606 08/20/24 1200 08/20/24 1912 08/21/24 0021 08/21/24 0916 08/22/24 0433  HGB 9.7*  --  9.7*  --  8.8* 8.8*  HCT 31.7*  --  31.6*  --  27.9* 27.9*  PLT 412*  --  494*  --  506* 498*  HEPARINUNFRC  --    < >  --  0.23* 0.36 0.35  CREATININE 1.01*  --  0.86  --  0.83  --    < > = values in this interval not displayed.    Estimated Creatinine Clearance: 45 mL/min (by C-G formula based on SCr of 0.83 mg/dL).  Assessment: 83 y.o. female with LE thrombus continues on heparin. Patient has been transitioned to DNR-comfort, planning home with hospice- per oncology recommendations (And with approval of TRH) will transition to DOAC. Oncology recommends low dose Eliquis - 5 mg in AM and 2.5 in PM given h/o GIB. Patient previously on apixaban , then subsequently placed on prophylactic lovenox  with IVC filter placement d/t c/f gastric bleed with significant anemia.   Goal of Therapy:  Heparin level 0.3-0.5 units/ml Monitor platelets by anticoagulation protocol: Yes   Plan:  STOP heparin  START Eliquis  5 mg in AM and 2.5 mg in PM  Eliquis  copay $60  Planning home with hospice    Massie Fila, PharmD Clinical Pharmacist  08/22/2024 8:00 AM

## 2024-08-23 ENCOUNTER — Other Ambulatory Visit (HOSPITAL_COMMUNITY): Payer: Self-pay

## 2024-08-23 ENCOUNTER — Encounter: Attending: Registered Nurse | Admitting: Registered Nurse

## 2024-08-23 DIAGNOSIS — I2699 Other pulmonary embolism without acute cor pulmonale: Secondary | ICD-10-CM | POA: Diagnosis not present

## 2024-08-23 LAB — CBC
HCT: 30.7 % — ABNORMAL LOW (ref 36.0–46.0)
Hemoglobin: 9.6 g/dL — ABNORMAL LOW (ref 12.0–15.0)
MCH: 26 pg (ref 26.0–34.0)
MCHC: 31.3 g/dL (ref 30.0–36.0)
MCV: 83.2 fL (ref 80.0–100.0)
Platelets: 492 K/uL — ABNORMAL HIGH (ref 150–400)
RBC: 3.69 MIL/uL — ABNORMAL LOW (ref 3.87–5.11)
RDW: 19.3 % — ABNORMAL HIGH (ref 11.5–15.5)
WBC: 12.2 K/uL — ABNORMAL HIGH (ref 4.0–10.5)
nRBC: 0 % (ref 0.0–0.2)

## 2024-08-23 MED ORDER — APIXABAN 2.5 MG PO TABS
ORAL_TABLET | ORAL | 11 refills | Status: DC
  Filled 2024-08-23: qty 45, 15d supply, fill #0

## 2024-08-23 MED ORDER — APIXABAN 2.5 MG PO TABS
ORAL_TABLET | ORAL | 0 refills | Status: DC
  Filled 2024-08-23: qty 36, 30d supply, fill #0

## 2024-08-23 MED ORDER — MORPHINE SULFATE 10 MG/5ML PO SOLN: 2.5000 mg | ORAL | 0 refills | Status: DC | PRN

## 2024-08-23 MED ORDER — APIXABAN 5 MG PO TABS
5.0000 mg | ORAL_TABLET | Freq: Every morning | ORAL | 11 refills | Status: DC
  Filled 2024-08-23: qty 30, 30d supply, fill #0

## 2024-08-23 MED ORDER — APIXABAN 2.5 MG PO TABS
2.5000 mg | ORAL_TABLET | Freq: Every evening | ORAL | 11 refills | Status: DC
  Filled 2024-08-23: qty 30, 30d supply, fill #0

## 2024-08-23 NOTE — Progress Notes (Signed)
   08/23/24 1020  Spiritual Encounters  Type of Visit Initial  Care provided to: Pt and family  Referral source Family;Nurse (RN/NT/LPN)   Chaplain responded to page and consult about legal guardianship paperwork. Spoke with family and social work team. Social work is handling completion of paperwork.

## 2024-08-23 NOTE — Care Management Important Message (Signed)
 Important Message  Patient Details  Name: Tiffany [name] MRN: [id number] Date of Birth: [birth year]   Important Message Given:  Yes - Medicare IM     Claretta Deed 08/23/2024, 3:10 PM

## 2024-08-23 NOTE — Discharge Summary (Signed)
 Physician Discharge Summary  Tiffany [name] FMW:[id number] DOB: 1941-04-05 DOA: 08/19/2024  PCP: Benjamine Aland, MD  Admit date: 08/19/2024 Discharge date: 08/23/2024  Time spent: 46 minutes  Recommendations for Outpatient Follow-up:  Discharging with home hospice  Discharge Diagnoses:  MAIN problem for hospitalization   Worsening DVT in the setting of metastatic cancer  Please see below for itemized issues addressed in HOpsital- refer to other progress notes for clarity if needed  Discharge Condition: Improved  Diet recommendation: Comfort measure feeds  Filed Weights   08/20/24 0255 08/20/24 2018  Weight: 54.4 kg 54.5 kg    History of present illness:  83 year old female -originally from [place] hypertension, dyslipidemia, mild carotid artery stenosis, and mild mitral regurgitation.  She had an admission for SVT Spoke with daughter who is an [employer] fundraiser--  functional at baseline  prior wityh recent trip to [place]  by herself dvt dfiagnosed  by Dr. Benjamine started elliquis 2 weeks-around 9 September--- no reports of dark or tarry stools at home   Chronology  Admitted 9 21 through 9/29 left occipital temporal infarct-had anemia which was considered chronic and GI deferred scope given high risk of anesthesia and a new stroke she was transfused and went to [facility] inpatient rehab with residual expressive aphasia etc. Discharge from [facility] 10/9-there was found to have right upper extremity right lower extremity DVTs and PEs IVC filter was placed 10/3 heparin changed to Lovenox  liver biopsy performed showed metastatic moderately differentiated adenocarcinoma likely gastric primary with elevated CEA [300 range] and CA 19-9----she had a persistent leukocytosis there pancultured and was discharged with family--- because of risk of bleeding with a DOAC was discharged on Lovenox   On 10/18-developed left leg swelling pain X 2 to 3 days unable to bear weight-went to urgent care 10/18-transferred to emergency  room--vascular surgery consulted after CT abdomen pelvis showed extensive thrombus in IVC extending into bilateral common iliac and femoral veins with left iliac and femoral thrombus---admitted by hospitalist      Pertinent imaging/studies till date  As per above dictation    Assessment  & Plan :      Worse DVT in bilateral lower extremities extending to filter 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 Hypokalemia Leukocytosis secondary to blood clots Severe malnutrition BMI 19  Ongoing discussions undertaken by vascular surgery as well oncology-she had multiple hepatic masses consistent with metastases with peritoneal mets additionally she was having abdominal discomfort and pain at and after discussion with the care team it was elected to transition to full comfort measures Initially she was placed on heparin because of worsening DVTs and vascular surgery felt she could transition to DOAC and she will be discharging on tapered dose of Eliquis  5 to 2.5 after mandatory 7 days She had low-grade fevers and leukocytosis in keeping with probable underlying metastatic process I had a good discussion with her daughters at the bed side 1 from [place] and other local being [place] core is a nurse here]--- they fully agreed with hospice philosophy and will be taking patient home and hospice has been coordinated with case management- We have de-escalated menance not in keeping with hospice philosophy-family is agreeable to the same I have signed DNR and comfort measure requirements and I have also placed signature for placard for guardianship paperwork   Discharge Exam: Vitals:   08/22/24 1951 08/23/24 0459  BP: 130/78 (!) 146/80  Pulse: 98 100  Resp:  20  Temp: 99.2 F (37.3 C) 99.8 F (37.7 C)  SpO2: 98% 94%  Subj on day of d/c   Sleepy when arouses gets irritable  General Exam on discharge  EOMI NCAT no distress Abdomen slightly distended S1-S2 no murmur no  rub no gallop Power 5/5 ROM intact  Discharge Instructions   Discharge Instructions     Diet - low sodium heart healthy   Complete by: As directed    Increase activity slowly   Complete by: As directed       Allergies as of 08/23/2024       Reactions   Elemental Sulfur Hives   Shellfish Allergy Anaphylaxis   Shellfish Allergy    Sulfa Antibiotics Hives        Medication List     STOP taking these medications    ascorbic acid 500 MG tablet Commonly known as: VITAMIN C   atorvastatin 20 MG tablet Commonly known as: LIPITOR   enoxaparin  40 MG/0.4ML injection Commonly known as: LOVENOX    FeroSul 325 (65 Fe) MG tablet Generic drug: ferrous sulfate   metoprolol tartrate 25 MG tablet Commonly known as: LOPRESSOR   metoprolol tartrate 50 MG tablet Commonly known as: LOPRESSOR   PREBIOTIC PRODUCT PO   predniSONE 5 MG tablet Commonly known as: DELTASONE   PROBIOTIC PO       TAKE these medications    Acetaminophen  Extra Strength 500 MG Tabs Take 2 tablets (1,000 mg total) by mouth 3 (three) times daily. What changed:  how much to take when to take this   apixaban  2.5 MG Tabs tablet Commonly known as: ELIQUIS  Take 2 tablets (5 mg total) by mouth in the morning for 6 days, THEN 1 tablet (2.5 mg total) in the morning. Start taking on: August 23, 2024   dorzolamide 2 % ophthalmic solution Commonly known as: TRUSOPT Place 1 drop into both eyes 2 (two) times daily.   Lumigan 0.01 % Soln Generic drug: bimatoprost Place 1 drop into both eyes at bedtime.   mirtazapine 15 MG tablet Commonly known as: REMERON Take 1 tablet (15 mg total) by mouth at bedtime.   morphine 10 MG/5ML solution Take 1.3 mLs (2.6 mg total) by mouth every 4 (four) hours as needed for severe pain (pain score 7-10).   pantoprazole 40 MG tablet Commonly known as: PROTONIX Take 1 tablet (40 mg total) by mouth 2 (two) times daily.       Allergies  Allergen Reactions    Elemental Sulfur Hives   Shellfish Allergy Anaphylaxis   Shellfish Allergy    Sulfa Antibiotics Hives      The results of significant diagnostics from this hospitalization (including imaging, microbiology, ancillary and laboratory) are listed below for reference.    Significant Diagnostic Studies: CT ABDOMEN PELVIS W CONTRAST Addendum Date: 08/20/2024 ** ADDENDUM #1 ** EXAM: CT ABDOMEN AND PELVIS WITH CONTRAST 08/20/2024 01:43:00 AM TECHNIQUE: CT of the abdomen and pelvis was performed with the administration of 75 mL of iohexol (OMNIPAQUE) 350 MG/ML injection. Multiplanar reformatted images are provided for review. Automated exposure control, iterative reconstruction, and/or weight-based adjustment of the mA/kV was utilized to reduce the radiation dose to as low as reasonably achievable. COMPARISON: CT abd pelvis 08/01/2024. CLINICAL HISTORY: Abdominal pain, acute, nonlocalized. FINDINGS: LOWER CHEST: No acute abnormality. LIVER: Interval increase in size of multiple hepatic masses with as an example a conglomeration of masses measuring 8.9 x 7.3 cm (from 7.7 x 6.6 cm). GALLBLADDER AND BILE DUCTS: Gallbladder is unremarkable. No biliary ductal dilatation. SPLEEN: No acute abnormality. PANCREAS: No acute abnormality. ADRENAL GLANDS: No  acute abnormality. KIDNEYS, URETERS AND BLADDER: Bilateral fluid density lesions of the kidneys likely represent a simple renal cysts. Simple renal cysts do not require additional follow-up unless clinically indicated due to signs/symptoms. Per consensus, no follow-up is needed for simple Bosniak type 1 and 2 renal cysts, unless the patient has a malignancy history or risk factors. No stones in the kidneys or ureters. No hydronephrosis. No perinephric or periureteral stranding. Urinary bladder is unremarkable. GI AND BOWEL: Similar appearing distal hypodense gastric wall thickening. There is no bowel obstruction. 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 No ascites. No free air. VASCULATURE: Filling defects throughout the vena cava and extending to the bilateral common iliac veins down to the femoral veins. Associated fat stranding along the left iliac and femoral veins suggestive of possible overlying thrombophlebitis. Recommend bilateral lower extremity ultrasound for further evaluation. Aorta is normal in caliber. Atherosclerotic plaque. LYMPH NODES: Redemonstration of mesenteric hypodense nodules likely representing lymph nodes or peritoneal deposits , as an example : stable 2.4x1.9 cm within the mid abdomen (4:37) REPRODUCTIVE ORGANS: No acute abnormality. BONES AND SOFT TISSUES: No acute osseous abnormality. No focal soft tissue abnormality. IMPRESSION: 1. 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 2. IVC filter in place. 3. Interval increase in size of multiple hepatic masses. Peritoneal carcinomatosis along the left abdomen. Similar distal hypodense gastric wall thickening. 4. Findings discussed with Dr. Trine over the phone by Dr. Margarite on 10/19/5 at 2:22am. Electronically signed by: Kate Margarite MD 08/20/2024 02:25 AM EDT RP Workstation: [id number]   Result Date: 08/20/2024 ** ORIGINAL REPORT ** EXAM: CT ABDOMEN AND PELVIS WITH CONTRAST 08/20/2024 01:43:00 AM TECHNIQUE: CT of the abdomen and pelvis was performed with the administration of 75 mL of iohexol (OMNIPAQUE) 350 MG/ML injection. Multiplanar reformatted images are provided for review. Automated exposure control, iterative reconstruction, and/or weight-based adjustment of the mA/kV was utilized to reduce the radiation dose to as low as reasonably achievable. COMPARISON: CT abd pelvis 08/01/2024.  CLINICAL HISTORY: Abdominal pain, acute, nonlocalized. FINDINGS: LOWER CHEST: No acute abnormality. LIVER: Interval increase in size of multiple hepatic masses with as an example a conglomeration of masses measuring 8.9 x 7.3 cm (from 7.7 x 6.6 cm). GALLBLADDER AND BILE DUCTS: Gallbladder is unremarkable. No biliary ductal dilatation. SPLEEN: No acute abnormality. PANCREAS: No acute abnormality. ADRENAL GLANDS: No acute abnormality. KIDNEYS, URETERS AND BLADDER: Bilateral fluid density lesions of the kidneys likely represent a simple renal cysts. Simple renal cysts do not require additional follow-up unless clinically indicated due to signs/symptoms. Per consensus, no follow-up is needed for simple Bosniak type 1 and 2 renal cysts, unless the patient has a malignancy history or risk factors. No stones in the kidneys or ureters. No hydronephrosis. No perinephric or periureteral stranding. Urinary bladder is unremarkable. GI AND BOWEL: Similar appearing distal hypodense gastric wall thickening. There is no bowel obstruction. 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 No ascites. No free air. VASCULATURE: Filling defects throughout the vena cava and extending to the bilateral common iliac veins down to the femoral veins. Associated fat stranding along the left iliac and femoral veins suggestive of possible overlying thrombophlebitis. Recommend bilateral lower extremity ultrasound for further evaluation. Aorta is normal in caliber. Atherosclerotic plaque. LYMPH NODES: Redemonstration of mesenteric hypodense nodules  likely representing lymph nodes or peritoneal deposits , as an example : stable 2.4x1.9 cm within the mid abdomen (4:37) REPRODUCTIVE ORGANS: No acute abnormality. BONES AND SOFT TISSUES: No acute osseous abnormality. No focal soft tissue abnormality. IMPRESSION: 1. 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 2. Interval increase in size of multiple hepatic masses. Peritoneal carcinomatosis along the left abdomen. Similar distal hypodense gastric wall thickening. Electronically signed by: Morgane Naveau MD 08/20/2024 02:20 AM EDT RP Workstation: [id number]   DG FEMUR MIN 2 VIEWS LEFT Result Date: 08/20/2024 EXAM: 2 VIEW(S) XRAY OF THE LEFT FEMUR 08/20/2024 01:29:31 AM COMPARISON: None available. CLINICAL HISTORY: 00502 Leg pain [id number]. LEFT LEG PAIN FINDINGS: BONES AND JOINTS: No acute fracture. No focal osseous lesion. No joint dislocation. SOFT TISSUES: The soft tissues are unremarkable. IMPRESSION: 1. Normal study. Electronically signed by: Norman Gatlin MD 08/20/2024 01:32 AM EDT RP Workstation: [id number]   DG Chest 1 View Result Date: 08/19/2024 CLINICAL DATA:  Shortness of breath. EXAM: CHEST  1 VIEW COMPARISON:  08/06/2024 FINDINGS: The cardiomediastinal contours are normal. Minor subsegmental atelectasis at the right lung base. Pulmonary vasculature is normal. No consolidation, pleural effusion, or pneumothorax. No acute osseous abnormalities are seen. IMPRESSION: No active disease. Electronically Signed   By: Andrea Gasman M.D.   On: 08/19/2024 17:10   DG CHEST PORT 1 VIEW Result Date: 08/06/2024 EXAM: 1 VIEW(S) XRAY OF THE CHEST 08/06/2024 08:47:00 AM COMPARISON: 10/17/2023 CLINICAL HISTORY: Acute ischemic left middle cerebral artery (MCA) stroke (HCC). FINDINGS: LUNGS AND PLEURA: Low lung volumes. No focal pulmonary opacity. No pulmonary edema. No pleural effusion. No pneumothorax. HEART AND MEDIASTINUM: Aortic atherosclerosis. No acute abnormality of the cardiac and mediastinal silhouettes. BONES AND SOFT TISSUES: No acute osseous abnormality. IMPRESSION: 1. No acute cardiopulmonary  abnormality 2. Aortic atherosclerosis 3. Low lung volumes Electronically signed by: Katheleen Faes MD 08/06/2024 09:37 AM EDT RP Workstation: [id number]   IR IVC FILTER PLMT / S&I PORTER GUID/MOD SED Result Date: 08/04/2024 INDICATION: 83 year old with evidence for intra-abdominal neoplastic disease. 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 Request for a IVC filter because the patient is not a good anticoagulation candidate. EXAM: IVC FILTER PLACEMENT; IVC VENOGRAM; ULTRASOUND FOR VASCULAR ACCESS Physician: Juliene SAUNDERS. Henn, MD MEDICATIONS: Moderate sedation ANESTHESIA/SEDATION: Moderate (conscious) sedation was employed during this procedure. A total of Versed 0.5 mg and fentanyl 25 mcg was administered intravenously at the order of the provider performing the procedure. Total intra-service moderate sedation time: 15 minutes. Patient's level of consciousness and vital signs were monitored continuously by radiology nurse throughout the procedure under the supervision of the provider performing the procedure. CONTRAST:  50 mL Omnipaque 300 FLUOROSCOPY: Radiation Exposure Index (as provided by the fluoroscopic device): 14 mGy Kerma COMPLICATIONS: None immediate. PROCEDURE: Informed consent was obtained for an IVC venogram and filter placement. Ultrasound demonstrated a patent right internal jugular vein. Ultrasound images were obtained for documentation. The right neck was prepped and draped in a sterile fashion. Maximal barrier sterile technique was utilized including caps, mask, sterile gowns, sterile gloves, sterile drape, hand hygiene and skin antiseptic. The skin was anesthetized with 1% lidocaine. A 21 gauge needle was directed into the vein with ultrasound guidance and a micropuncture dilator set was placed. A wire was advanced into the IVC. The filter sheath was  advanced over the wire into the IVC. An IVC venogram was performed. Fluoroscopic images were obtained for documentation.  A Bard Denali filter was deployed below the lowest renal vein. A follow-up venogram was performed and the vascular sheath was removed with manual compression. FINDINGS: IVC was patent. Bilateral renal veins were identified. The filter was deployed below the lowest renal vein. Follow-up venogram confirmed placement within the IVC and below the renal veins. IMPRESSION: Successful placement of a retrievable IVC filter. PLAN: Due to patient related comorbidities and/or clinical necessity, this IVC filter should be considered a permanent device. This patient will not be actively followed for future filter retrieval. Electronically Signed   By: Juliene Balder M.D.   On: 08/04/2024 16:13   IR US  LIVER BIOPSY Result Date: 08/04/2024 INDICATION: 83 year old with liver lesions and needs a tissue diagnosis. EXAM: ULTRASOUND-GUIDED LIVER LESION BIOPSY MEDICATIONS: Moderate sedation ANESTHESIA/SEDATION: Moderate (conscious) sedation was employed during this procedure. A total of Versed 0.5 mg and Fentanyl 50 mcg was administered intravenously by the radiology nurse. Total intra-service moderate Sedation Time: 18 minutes. The patient's level of consciousness and vital signs were monitored continuously by radiology nursing throughout the procedure under my direct supervision. FLUOROSCOPY TIME:  None COMPLICATIONS: None immediate. PROCEDURE: Informed written consent was obtained from the patient/family after a thorough discussion of the procedural risks, benefits and alternatives. All questions were addressed. Maximal Sterile Barrier Technique was utilized including caps, mask, sterile gowns, sterile gloves, sterile drape, hand hygiene and skin antiseptic. A timeout was performed prior to the initiation of the procedure. Abdomen was evaluated with ultrasound. A right hepatic lesion was identified for biopsy. The right side of the abdomen was prepped with chlorhexidine and sterile field was created. Skin was anesthetized using 1%  lidocaine. Small incision was made. Using ultrasound guidance, 17 gauge coaxial needle was directed into the right hepatic lesion. Total of 3 core biopsies were performed with an 18 gauge core device. Three adequate specimens obtained and placed in formalin. Gel-Foam slurry was injected as the 17 gauge needle was removed. Bandage placed over the puncture site. FINDINGS: Heterogeneous hepatic lesions. A large lesion in the right hepatic lobe with central necrosis was biopsied. 3 adequate specimens were obtained. No immediate bleeding or hematoma formation. IMPRESSION: Ultrasound-guided core biopsy of a right hepatic lesion. Electronically Signed   By: Juliene Balder M.D.   On: 08/04/2024 16:07   DG Abd 1 View Result Date: 08/04/2024 CLINICAL DATA:  Constipation. EXAM: ABDOMEN - 1 VIEW COMPARISON:  CT 08/01/2024 FINDINGS: No bowel dilatation or evidence of obstruction. Small to moderate volume of formed stool throughout the colon, diminished stool burden from prior CT. Calcifications in the right pelvis correspond to retroperitoneal phleboliths. Overlying artifacts project over the pelvis. IMPRESSION: Small to moderate volume of formed stool throughout the colon, diminished stool burden from prior CT. Electronically Signed   By: Andrea Gasman M.D.   On: 08/04/2024 13:54   CT ABDOMEN PELVIS W CONTRAST Result Date: 08/02/2024 CLINICAL DATA:  The patient had a CTA chest earlier today for pulmonary embolism. A mass was noted in the central liver as well as evidence of peritoneal metastases in the left upper abdomen. EXAM: CT ABDOMEN AND PELVIS WITH CONTRAST TECHNIQUE: Multidetector CT imaging of the abdomen and pelvis was performed using the standard protocol following bolus administration of intravenous contrast. RADIATION DOSE REDUCTION: This exam was performed according to the departmental dose-optimization program which includes automated exposure control, adjustment of the mA and/or kV according to  patient size  and/or use of iterative reconstruction technique. CONTRAST:  60mL OMNIPAQUE IOHEXOL 350 MG/ML SOLN COMPARISON:  Ultrasound complete abdomen 01/12/2019 was negative except for a 5 cm left renal cyst. The only other cross-sectional comparison is today's CTA chest. FINDINGS: Lower chest: There is mild cardiomegaly, minimal pericardial effusion. Scattered three-vessel coronary calcifications. Lung bases are clear apart from posterior atelectatic change in the lower lobes. Hepatobiliary: There are multiple hepatic masses consistent with metastases. The most likely primary is gastric given the observation of a circumferentially thickened mid to distal stomach and gastrohepatic ligament adenopathy. The largest mass appears to be a coalescence of at least 3 masses involving portions of segments 5 and 8. This measures 7.7 x 6.6 cm on 3:18. There is a segment 4 mass measuring 2.8 x 3.4 cm on 3:25, and a segment 1 mass significantly narrowing the intrahepatic IVC measuring 4.3 x 4.1 cm on 3:17. Out laterally in segment 2 there is another mass measuring 5.4 x 3.3 cm on 3:17. There are few additional scattered small masses in both lobes. The gallbladder and bile ducts are unremarkable. Pancreas: No abnormality Spleen: No abnormality. Adrenals/Urinary Tract: There is no adrenal mass. In the left upper pole there is a 4.1 cm homogeneous Bosniak 2 cyst, Hounsfield density is 22.2. A second Bosniak 2 cyst just below this measures 1.2 cm and 23 Hounsfield units, and on the right there is a third parapelvic Bosniak 2 cyst in the inferior pole measuring 1.9 cm on 23 Hounsfield units. No follow-up imaging is recommended. There is no discrete renal mass enhancement. There is contrast in the collecting systems and ureters which would obscure stones but there is no hydronephrosis. The bladder wall and lumen are unremarkable. There is dense contrast opacifying the bladder. Stomach/Bowel: Circumferentially thickened wall in the mid distal  stomach particularly posteriorly and inferiorly, suspicious for infiltrating disease such as carcinoma. The small bowel is normal caliber. The appendix is normal. There is moderate retained stool. No overt colonic wall thickening. Scattered uncomplicated diverticulosis. Vascular/Lymphatic: Aortic atherosclerosis. No AAA or acute vascular findings. There is mass effect on the intrahepatic IVC due to a segment 1 liver metastasis described above. No thrombus is seen within it. The portal vein and branches are widely patent. There is an enlarged necrotic gastrohepatic ligament lymph measuring 2.4 x 2.2 cm on 3:21. There are pathologic lymph nodes inferior to the stomach as well, ranging from 9 mm short axis up to the largest right of the midline measuring 2.4 x 1.6 cm on 3:38. Additional evidence of peritoneal metastatic disease with left upper quadrant nodular peritoneal metastases measuring up to 1.6 x 1.5 cm on 3:26. No adenopathy is seen in the pelvis. Reproductive: Status post hysterectomy. No adnexal masses. Other: Small amount of pelvic ascites. No drainable pocket. No free air or free hemorrhage. Small umbilical fat hernia. Musculoskeletal: No regional bone metastasis is seen. Mild degenerative change thoracic and lumbar spine. IMPRESSION: 1. Multiple hepatic masses consistent with metastases. The most likely primary is gastric given the circumferentially thickened mid to distal stomach, and hypogastric and gastrohepatic ligament adenopathy. 2. 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 3. Small amount of pelvic ascites. 4. Aortic and coronary artery atherosclerosis. 5. Cardiomegaly with minimal pericardial effusion. 6. Umbilical fat hernia. 7. These results will be called to the ordering clinician or representative by the Radiologist Assistant, and communication documented in the PACS or [employer]. Aortic Atherosclerosis (ICD10-I70.0). Electronically Signed   By: Francis Beatriz HERO.D.  On:  08/02/2024 00:35   CT Angio Chest Pulmonary Embolism (PE) W or WO Contrast Result Date: 08/01/2024 CLINICAL DATA:  Fever tachycardia EXAM: CT ANGIOGRAPHY CHEST WITH CONTRAST TECHNIQUE: Multidetector CT imaging of the chest was performed using the standard protocol during bolus administration of intravenous contrast. Multiplanar CT image reconstructions and MIPs were obtained to evaluate the vascular anatomy. RADIATION DOSE REDUCTION: This exam was performed according to the departmental dose-optimization program which includes automated exposure control, adjustment of the mA and/or kV according to patient size and/or use of iterative reconstruction technique. CONTRAST:  60mL OMNIPAQUE IOHEXOL 350 MG/ML SOLN COMPARISON:  Chest x-ray 10/17/2023 FINDINGS: Cardiovascular: Satisfactory opacification of the pulmonary arteries to the segmental level. Small emboli within right upper lobe subsegmental branches. Small volume right lower lobe segmental and subsegmental PE. Mild aortic atherosclerosis. No aneurysm. Mild coronary vascular calcification. Borderline cardiomegaly. No pericardial effusion Mediastinum/Nodes: Patent trachea. No thyroid mass. No suspicious lymph nodes. Esophagus within normal limits except for small hiatal hernia Lungs/Pleura: Mild dependent atelectasis. No consolidation or pleural effusion. Punctate 2 mm lingular pulmonary nodule, no specific imaging follow-up is recommended. Upper Abdomen: Incompletely visualized is vague hypodensity in the central liver, series 5 image 114. Soft tissue nodularity within the left upper quadrant incompletely assessed, series 5 image 91 through 114. Musculoskeletal: No acute osseous abnormality. 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 IMPRESSION: 1. Small volume right upper lobe and right lower lobe segmental and subsegmental pulmonary emboli.  2. Incompletely visualized vague hypodensity in the central liver, raising concern for possible liver lesion/mass. In addition, there is incompletely visualized nodularity within the left upper quadrant as could be seen with peritoneal metastatic disease. Recommend dedicated abdominopelvic contrast-enhanced CT. 3. Critical Value/emergent results were called by telephone at the time of interpretation on 08/01/2024 at 8:52 pm to provider NP Debby , who verbally acknowledged these results. Aortic Atherosclerosis (ICD10-I70.0). Electronically Signed   By: Luke Bun M.D.   On: 08/01/2024 20:53   VAS US  LOWER EXTREMITY VENOUS (DVT) Result Date: 07/25/2024  Lower Venous DVT Study Patient Name:  Tiffany [name]  Date of Exam:   07/25/2024 Medical Rec #: [id number]         Accession #:    [id number] Date of Birth: 1941-04-26          Patient Gender: F Patient Age:   77 years Exam Location:  [facility] Procedure:      VAS US  LOWER EXTREMITY VENOUS (DVT) Referring Phys: HARLENE BOWL --------------------------------------------------------------------------------  Indications: Superficial venous thrombosis (SVT) I82.819.  Risk Factors: None identified. Comparison Study: No prior studies. Performing Technologist: Cordella Collet RVT  Examination Guidelines: A complete evaluation includes B-mode imaging, spectral Doppler, color Doppler, and power Doppler as needed of all accessible portions of each vessel. Bilateral testing is considered an integral part of a complete examination. Limited examinations for reoccurring indications may be performed as noted. The reflux portion of the exam is performed with the patient in reverse Trendelenburg.  +---------+---------------+---------+-----------+----------+--------------+ RIGHT    CompressibilityPhasicitySpontaneityPropertiesThrombus Aging +---------+---------------+---------+-----------+----------+--------------+ CFV      Full           Yes      Yes                                  +---------+---------------+---------+-----------+----------+--------------+ SFJ  Full                                                        +---------+---------------+---------+-----------+----------+--------------+ FV Prox  Full                                                        +---------+---------------+---------+-----------+----------+--------------+ FV Mid   Full                                                        +---------+---------------+---------+-----------+----------+--------------+ FV DistalFull                                                        +---------+---------------+---------+-----------+----------+--------------+ PFV      Full                                                        +---------+---------------+---------+-----------+----------+--------------+ POP      None           No       No                   Acute          +---------+---------------+---------+-----------+----------+--------------+ PTV      Partial                                      Acute          +---------+---------------+---------+-----------+----------+--------------+ PERO     None                                         Acute          +---------+---------------+---------+-----------+----------+--------------+ Gastroc  Full                                                        +---------+---------------+---------+-----------+----------+--------------+   +----+---------------+---------+-----------+----------+--------------+ LEFTCompressibilityPhasicitySpontaneityPropertiesThrombus Aging +----+---------------+---------+-----------+----------+--------------+ CFV Full           Yes      Yes                                 +----+---------------+---------+-----------+----------+--------------+    Summary: RIGHT: - Findings consistent with acute deep vein thrombosis involving the right popliteal vein,  right posterior  tibial veins, and right peroneal veins.  - No cystic structure found in the popliteal fossa.  LEFT: - No evidence of common femoral vein obstruction.   *See table(s) above for measurements and observations. Electronically signed by Debby Robertson on 07/25/2024 at 5:14:24 PM.    Final    ECHOCARDIOGRAM COMPLETE Result Date: 07/24/2024    ECHOCARDIOGRAM REPORT   Patient Name:   Saiya Crist Date of Exam: 07/24/2024 Medical Rec #:  [id number]        Height:       60.0 in Accession #:    [id number]       Weight:       130.0 lb Date of Birth:  07/03/1941         BSA:          1.554 m Patient Age:    83 years         BP:           122/67 mmHg Patient Gender: F                HR:           96 bpm. Exam Location:  Inpatient Procedure: 2D Echo, Cardiac Doppler and Color Doppler (Both Spectral and Color            Flow Doppler were utilized during procedure). Indications:   Abnormal ECG R94.31  History:       Patient has no prior history of Echocardiogram examinations.                Stroke.  Sonographer:   Thea Norlander RCS Referring      [facility] Adya Wirz Phys: IMPRESSIONS  1. Left ventricular ejection fraction, by estimation, is 60 to 65%. The left ventricle has normal function. The left ventricle has no regional wall motion abnormalities. Left ventricular diastolic parameters were normal.  2. Right ventricular systolic function is normal. The right ventricular size is normal.  3. The mitral valve is normal in structure. No evidence of mitral valve regurgitation. No evidence of mitral stenosis.  4. The aortic valve is abnormal. There is mild thickening of the aortic valve with a small subcentimeter echodensity at the LV side of the valve. There is mild aortic valve regurgitation. Consider TEE for further evaluation in setting of recent CVA.  5. The inferior vena cava is normal in size with greater than 50% respiratory variability, suggesting right atrial pressure of 3 mmHg. FINDINGS  Left Ventricle: Left ventricular  ejection fraction, by estimation, is 60 to 65%. The left ventricle has normal function. The left ventricle has no regional wall motion abnormalities. The left ventricular internal cavity size was normal in size. There is  no left ventricular hypertrophy. Left ventricular diastolic parameters were normal. Right Ventricle: The right ventricular size is normal. No increase in right ventricular wall thickness. Right ventricular systolic function is normal. Left Atrium: Left atrial size was normal in size. Right Atrium: Right atrial size was normal in size. Pericardium: There is no evidence of pericardial effusion. Mitral Valve: The mitral valve is normal in structure. No evidence of mitral valve regurgitation. No evidence of mitral valve stenosis. Tricuspid Valve: The tricuspid valve is normal in structure. Tricuspid valve regurgitation is not demonstrated. No evidence of tricuspid stenosis. Aortic Valve: The aortic valve is abnormal. There is mild thickening of the aortic valve. Aortic valve regurgitation is mild. No aortic stenosis is present. Aortic valve peak gradient measures 10.4 mmHg. Pulmonic Valve: The  pulmonic valve was normal in structure. Pulmonic valve regurgitation is not visualized. No evidence of pulmonic stenosis. Aorta: The aortic root is normal in size and structure. Venous: The inferior vena cava is normal in size with greater than 50% respiratory variability, suggesting right atrial pressure of 3 mmHg. IAS/Shunts: No atrial level shunt detected by color flow Doppler.  LEFT VENTRICLE PLAX 2D LVIDd:         4.20 cm   Diastology LVIDs:         2.50 cm   LV e' medial:    9.46 cm/s LV PW:         0.90 cm   LV E/e' medial:  6.6 LV IVS:        0.90 cm   LV e' lateral:   14.70 cm/s LVOT diam:     2.00 cm   LV E/e' lateral: 4.2 LV SV:         51 LV SV Index:   33 LVOT Area:     3.14 cm  RIGHT VENTRICLE             IVC RV S prime:     13.80 cm/s  IVC diam: 1.40 cm TAPSE (M-mode): 1.9 cm LEFT ATRIUM              Index        RIGHT ATRIUM           Index LA diam:        4.30 cm 2.77 cm/m   RA Area:     10.50 cm LA Vol (A2C):   39.8 ml 25.61 ml/m  RA Volume:   18.63 ml  11.99 ml/m LA Vol (A4C):   26.8 ml 17.24 ml/m LA Biplane Vol: 30.1 ml 19.37 ml/m  AORTIC VALVE AV Area (Vmax): 1.92 cm AV Vmax:        161.00 cm/s AV Peak Grad:   10.4 mmHg LVOT Vmax:      98.50 cm/s LVOT Vmean:     65.200 cm/s LVOT VTI:       0.163 m  AORTA Ao Root diam: 3.00 cm Ao Asc diam:  3.60 cm MITRAL VALVE MV Area (PHT): 5.31 cm    SHUNTS MV Decel Time: 143 msec    Systemic VTI:  0.16 m MV E velocity: 62.40 cm/s  Systemic Diam: 2.00 cm MV A velocity: 96.40 cm/s MV E/A ratio:  0.65 Aditya Sabharwal Electronically signed by Ria Commander Signature Date/Time: 07/24/2024/3:45:09 PM    Final    MR BRAIN WO CONTRAST Result Date: 07/24/2024 CLINICAL DATA:  Provided history: Stroke, follow-up. EXAM: MRI HEAD WITHOUT CONTRAST TECHNIQUE: Multiplanar, multiecho pulse sequences of the brain and surrounding structures were obtained without intravenous contrast. COMPARISON:  Non-contrast head CT and CT angiogram head/neck 07/23/2024. FINDINGS: Intermittently motion degraded examination. Most notably, the axial T1 sequence is moderate to severely motion degraded and the coronal T2 sequence is severely motion degraded. Within this limitation, findings are swallows. Brain: Generalized cerebral atrophy. Large acute left MCA territory infarct affecting the posterior insula, posterior frontal lobe, parietal lobe and occipital lobe. This infarct does not appear significantly changed in extent as compared to yesterday's head CT. No significant mass effect at this time. No evidence of hemorrhagic conversion. Tiny acute cortical infarct within the perirolandic right frontoparietal region. Tiny acute infarct within the right parietal white matter (series 2, image 36). Mild multifocal T2 FLAIR hyperintense signal abnormality elsewhere within the cerebral white  matter and pons, nonspecific but  compatible chronic small ischemic disease. Redemonstrated chronic microhemorrhage within the left occipital lobe. Tiny acute infarcts within the bilateral cerebellar hemispheres. probable small (2-3 mm) meningioma along the right petrous apex better appreciated on the prior brain MRI of 12/28/2021 (series 6, image 8 of the current exam). No extra-axial fluid collection. No midline shift. Vascular: Please see the CTA head/neck performed yesterday. Skull and upper cervical spine: No focal race marrow lesion. Sinuses/Orbits: No mass or acute finding within the imaged orbits. Prior bilateral ocular lens replacement. No significant paranasal sinus disease. IMPRESSION: 1. Intermittently motion degraded examination as described. Within this limitation, findings are as follows. 2. Large acute left MCA territory infarct, as described and not significantly changed in extent since yesterday's head CT. No significant mass effect at this time. No evidence of hemorrhagic conversion. 3. Additional tiny acute infarcts within the perirolandic right frontoparietal cortex, right parietal white matter and bilateral cerebellar hemispheres. 4. Background parenchymal atrophy and chronic small vessel ischemic disease. 5. 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 Electronically Signed   By: Rockey Childs D.O.   On: 07/24/2024 13:13    Microbiology: No results found for this or any previous visit (from the past 240 hours).   Labs: Basic Metabolic Panel: Recent Labs  Lab 08/19/24 1606 08/20/24 1912 08/21/24 0916  NA 139 140 141  K 3.6 3.4* 3.3*  CL 105 105 105  CO2 19* 21* 21*  GLUCOSE 136* 85 93  BUN 26* 22 19  CREATININE 1.01* 0.86 0.83  CALCIUM 8.8* 8.6* 8.6*   Liver Function Tests: Recent Labs  Lab 08/19/24 1606  AST 30  ALT 22  ALKPHOS 286*  BILITOT 0.8  PROT 7.1  ALBUMIN 2.2*   No results for input(s):  LIPASE, AMYLASE in the last 168 hours. No results for input(s): AMMONIA in the last 168 hours. CBC: Recent Labs  Lab 08/19/24 1606 08/20/24 1912 08/21/24 0916 08/22/24 0433 08/23/24 0538  WBC 20.3* 14.5* 12.6* 12.2* 12.2*  NEUTROABS  --  9.9*  --   --   --   HGB 9.7* 9.7* 8.8* 8.8* 9.6*  HCT 31.7* 31.6* 27.9* 27.9* 30.7*  MCV 85.7 84.9 82.8 82.3 83.2  PLT 412* 494* 506* 498* 492*   Cardiac Enzymes: No results for input(s): CKTOTAL, CKMB, CKMBINDEX, TROPONINI in the last 168 hours. BNP: BNP (last 3 results) No results for input(s): BNP in the last 8760 hours.  ProBNP (last 3 results) No results for input(s): PROBNP in the last 8760 hours.  CBG: No results for input(s): GLUCAP in the last 168 hours.  Signed:  Colen Grimes MD   [facility] 08/23/2024, 11:04 AM

## 2024-08-23 NOTE — Plan of Care (Signed)

## 2024-08-23 NOTE — TOC Transition Note (Addendum)
 Transition of Care [facility]) - Discharge Note   Patient Details  Name: Tiffany [name] MRN: [id number] Date of Birth: 03/23/1941  [facility]) CM/SW Contact:  Tom-Johnson, Harvest Muskrat, RN Phone Number: 08/23/2024, 12:20 PM   Clinical Narrative:     Patient is scheduled for discharge today with Hospice services from [facility].  Readmission Risk Assessment done. Hospice info,, hospital f/u and discharge instructions on AVS. Prescriptions sent to [facility] pharmacy and patient will receive meds prior discharge. PTAR scheduled to transport at discharge.  No further ICM needs noted.  12:26- Discharge canceled today d/t Hospital bed not available till tomorrow. Hospice will deliver by end of the day or tomorrow.      Final next level of care: Home w Hospice Care Barriers to Discharge: Continued Medical Work up   Patient Goals and CMS Choice Patient states their goals for this hospitalization and ongoing recovery are:: To return home with Hospice services CMS Medicare.gov Compare Post Acute Care list provided to:: Patient Choice offered to / list presented to : Patient, Adult Children (Daughter, [name])      Discharge Placement                Patient to be transferred to facility by: PTAR Name of family member notified: [name]    Discharge Plan and Services Additional resources added to the After Visit Summary for   In-house Referral: Clinical Social Work              DME Arranged: N/A DME Agency: NA                  Social Drivers of Health (SDOH) Interventions SDOH Screenings   Food Insecurity: Patient Unable To Answer (08/21/2024)  Housing: Patient Unable To Answer (08/21/2024)  Transportation Needs: Patient Unable To Answer (08/21/2024)  Utilities: Patient Unable To Answer (08/21/2024)  Social Connections: Patient Unable To Answer (08/21/2024)  Tobacco Use: Low Risk  (08/19/2024)     Readmission Risk Interventions    08/23/2024   12:17 PM  08/21/2024    4:09 PM  Readmission Risk Prevention Plan  Transportation Screening Complete Complete  PCP or Specialist Appt within 5-7 Days  Complete  PCP or Specialist Appt within 3-5 Days Complete   Home Care Screening  Complete  Medication Review (RN CM)  Referral to Pharmacy  HRI or Home Care Consult Complete   Social Work Consult for Recovery Care Planning/Counseling Complete   Palliative Care Screening Complete   Medication Review Oceanographer) Referral to Pharmacy

## 2024-08-23 NOTE — Progress Notes (Incomplete)
 TRH   ROUNDING   NOTE Tiffany [name] FMW:[id number]  DOB: 1941/05/08  DOA: 08/19/2024  PCP: Benjamine Aland, MD  08/23/2024,9:56 AM  LOS: 3 days    Code Status:  ***     From:  ***    83 year old female -originally from [place] hypertension, dyslipidemia, mild carotid artery stenosis, and mild mitral regurgitation.  She had an admission for SVT Spoke with daughter who is an Charity fundraiser--  functional at baseline  prior wityh recent trip to [place]  by herself dvt dfiagnosed  by Dr. Benjamine started elliquis 2 weeks-around 9 September--- no reports of dark or tarry stools at home  Chronology  Admitted 9 21 through 9/29 left occipital temporal infarct-had anemia which was considered chronic and GI deferred scope given high risk of anesthesia and a new stroke she was transfused and went to [facility] inpatient rehab with residual expressive aphasia etc. Discharge from [facility] 10/9-there was found to have right upper extremity right lower extremity DVTs and PEs IVC filter was placed 10/3 heparin changed to Lovenox  liver biopsy performed showed metastatic moderately differentiated adenocarcinoma likely gastric primary with elevated CEA and CA 19-9-she had a persistent leukocytosis there pancultured and was discharged with family--- because of risk of bleeding with a DOAC was discharged on Lovenox  On 10/18-developed left leg swelling pain X 2 to 3 days unable to bear weight-went to urgent care 10/18-transferred to emergency room--vascular surgery consulted after CT abdomen pelvis showed extensive thrombus in IVC extending into bilateral common iliac and femoral veins with left iliac and femoral thrombus---admitted by hospitalist    Pertinent imaging/studies till date  As per above dictation   Assessment  & Plan :    Worse DVT in bilateral lower extremities extending to filter 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 Hypokalemia Leukocytosis secondary to blood clots Severe malnutrition BMI 19  Data  Reviewed today:  WBC 12.2 hemoglobin 9.6 platelet 492   DVT prophylaxis: ***  Status is: Inpatient {Inpatient:23812}    Dispo/Global plan: ***   Time  ***   Subjective:   ***    Objective + exam Vitals:   08/22/24 0727 08/22/24 1634 08/22/24 1951 08/23/24 0459  BP: 129/69 121/83 130/78 (!) 146/80  Pulse: (!) 102 88 98 100  Resp: 18 18  20   Temp: 99 F (37.2 C) 98.1 F (36.7 C) 99.2 F (37.3 C) 99.8 F (37.7 C)  TempSrc: Oral Oral Oral Oral  SpO2: 98% 99% 98% 94%  Weight:      Height:       Filed Weights   08/20/24 0255 08/20/24 2018  Weight: 54.4 kg 54.5 kg     Examination: ***     Scheduled Meds:  acetaminophen   500 mg Oral BID   apixaban   5 mg Oral q AM   And   apixaban   2.5 mg Oral QPM   ascorbic acid  500 mg Oral Daily   atorvastatin  20 mg Oral Daily   dorzolamide  1 drop Both Eyes BID   feeding supplement  237 mL Oral BID BM   ferrous sulfate  325 mg Oral Q breakfast   latanoprost  1 drop Both Eyes QHS   metoprolol tartrate  25 mg Oral Daily   mirtazapine  15 mg Oral QHS   pantoprazole  40 mg Oral BID   predniSONE  2.5 mg Oral Daily   Continuous Infusions:   Jai-Gurmukh Patty Leitzke, MD  [facility]

## 2024-08-23 NOTE — Progress Notes (Signed)
 Patient being discharged home with hospice. AVS reviewed with daughter. Meds picked up from TOC. IV removed. Tele disconnected. Patient awaiting PTAR to transport home.

## 2024-08-24 LAB — CBC
HCT: 31.2 % — ABNORMAL LOW (ref 36.0–46.0)
Hemoglobin: 9.7 g/dL — ABNORMAL LOW (ref 12.0–15.0)
MCH: 25.9 pg — ABNORMAL LOW (ref 26.0–34.0)
MCHC: 31.1 g/dL (ref 30.0–36.0)
MCV: 83.2 fL (ref 80.0–100.0)
Platelets: 485 K/uL — ABNORMAL HIGH (ref 150–400)
RBC: 3.75 MIL/uL — ABNORMAL LOW (ref 3.87–5.11)
RDW: 19.3 % — ABNORMAL HIGH (ref 11.5–15.5)
WBC: 13.5 K/uL — ABNORMAL HIGH (ref 4.0–10.5)
nRBC: 0 % (ref 0.0–0.2)

## 2024-08-24 NOTE — Progress Notes (Signed)
 She is fast asleep at the bedside-I did not disturb her Patient to be discharged to home with hospice once delivery of all durable equipment is ordered and available Social work etc. are aware of need of transport at discharge time   No charge   Reggie Grimes, MD [facility] 11:22 AM

## 2024-08-24 NOTE — TOC Transition Note (Signed)
 Transition of Care [facility]) - Discharge Note   Patient Details  Name: Tiffany [name] MRN: [id number] Date of Birth: [birth year]  [facility] CM/SW Contact:  Tiffany [name], Tiffany Muskrat, RN Phone Number: 08/24/2024, 2:50 PM   Clinical Narrative:     Hospital bed delivered to patient's home. Daughter, [name] notified of discharge. PTAR schedule to transport at discharge. No further TOC needs noted.       Final next level of care: Home w Hospice Care Barriers to Discharge: Continued Medical Work up   Patient Goals and CMS Choice Patient states their goals for this hospitalization and ongoing recovery are:: To return home with Hospice services CMS Medicare.gov Compare Post Acute Care list provided to:: Patient Choice offered to / list presented to : Patient, Adult Children (Daughter, [name])      Discharge Placement                Patient to be transferred to facility by: PTAR Name of family member notified: [name]    Discharge Plan and Services Additional resources added to the After Visit Summary for   In-house Referral: Clinical Social Work              DME Arranged: N/A DME Agency: NA                  Social Drivers of Health (SDOH) Interventions SDOH Screenings   Food Insecurity: Patient Unable To Answer (08/21/2024)  Housing: High Risk (08/23/2024)  Transportation Needs: Patient Unable To Answer (08/21/2024)  Utilities: Patient Unable To Answer (08/21/2024)  Social Connections: Patient Unable To Answer (08/21/2024)  Tobacco Use: Low Risk  (08/19/2024)     Readmission Risk Interventions    08/23/2024   12:17 PM 08/21/2024    4:09 PM  Readmission Risk Prevention Plan  Transportation Screening Complete Complete  PCP or Specialist Appt within 5-7 Days  Complete  PCP or Specialist Appt within 3-5 Days Complete   Home Care Screening  Complete  Medication Review (RN CM)  Referral to Pharmacy  HRI or Home Care Consult Complete    Social Work Consult for Recovery Care Planning/Counseling Complete   Palliative Care Screening Complete   Medication Review Oceanographer) Referral to Pharmacy

## 2024-08-24 NOTE — Progress Notes (Signed)
 Tiffany [name] to be discharged home per MD order. Spoke with daughter, [name]. After Visit Summary was printed and given to PTAR. Patient escorted via stretcher, and discharged home via PTAR.  Tiffany [name]  08/24/2024

## 2024-08-29 ENCOUNTER — Ambulatory Visit: Admitting: Hematology

## 2024-08-29 ENCOUNTER — Other Ambulatory Visit

## 2024-08-29 DIAGNOSIS — C787 Secondary malignant neoplasm of liver and intrahepatic bile duct: Secondary | ICD-10-CM | POA: Diagnosis not present

## 2024-08-30 DIAGNOSIS — C787 Secondary malignant neoplasm of liver and intrahepatic bile duct: Secondary | ICD-10-CM | POA: Diagnosis not present

## 2024-09-02 DIAGNOSIS — C787 Secondary malignant neoplasm of liver and intrahepatic bile duct: Secondary | ICD-10-CM | POA: Diagnosis not present

## 2024-09-04 DIAGNOSIS — C787 Secondary malignant neoplasm of liver and intrahepatic bile duct: Secondary | ICD-10-CM | POA: Diagnosis not present

## 2024-09-12 DIAGNOSIS — I63512 Cerebral infarction due to unspecified occlusion or stenosis of left middle cerebral artery: Secondary | ICD-10-CM | POA: Diagnosis not present

## 2024-09-15 ENCOUNTER — Telehealth: Payer: Self-pay

## 2024-09-15 NOTE — Telephone Encounter (Signed)
 SW returned phone call to patient daughter who was inquiring about getting a letter from a physician to indicate pt is incompetent. SW shared she would need to follow-up with a provider that has seen patient as she has not been seen from any of the rehab physicians since discharge. Reports pt is now a part of hospice. States SW with hospice stated physician cannot write letter as she has not been assessed yet. She has been encouraged to wait for pt to be assessed by hospice MD unless she has follow-up with another provider. Reports her mother is not doing well and unable to move so will wait for hospice MD.   Case closed to SW.   Graeme Jude, MSW, LCSW Office: [phone] Cell: [phone] Fax: [phone]

## 2024-10-02 ENCOUNTER — Other Ambulatory Visit (HOSPITAL_COMMUNITY): Payer: Self-pay

## 2024-10-06 ENCOUNTER — Emergency Department (HOSPITAL_COMMUNITY)

## 2024-10-06 ENCOUNTER — Emergency Department (HOSPITAL_COMMUNITY)
Admission: EM | Admit: 2024-10-06 | Discharge: 2024-10-07 | Disposition: A | Attending: Emergency Medicine | Admitting: Emergency Medicine

## 2024-10-06 DIAGNOSIS — R1111 Vomiting without nausea: Secondary | ICD-10-CM | POA: Diagnosis not present

## 2024-10-06 DIAGNOSIS — C801 Malignant (primary) neoplasm, unspecified: Secondary | ICD-10-CM | POA: Diagnosis not present

## 2024-10-06 DIAGNOSIS — R627 Adult failure to thrive: Secondary | ICD-10-CM | POA: Diagnosis not present

## 2024-10-06 DIAGNOSIS — C786 Secondary malignant neoplasm of retroperitoneum and peritoneum: Secondary | ICD-10-CM | POA: Diagnosis not present

## 2024-10-06 DIAGNOSIS — I959 Hypotension, unspecified: Secondary | ICD-10-CM | POA: Diagnosis not present

## 2024-10-06 DIAGNOSIS — R160 Hepatomegaly, not elsewhere classified: Secondary | ICD-10-CM | POA: Diagnosis not present

## 2024-10-06 DIAGNOSIS — C787 Secondary malignant neoplasm of liver and intrahepatic bile duct: Secondary | ICD-10-CM | POA: Diagnosis not present

## 2024-10-06 DIAGNOSIS — R000 Tachycardia, unspecified: Secondary | ICD-10-CM | POA: Diagnosis not present

## 2024-10-06 LAB — CBC WITH DIFFERENTIAL/PLATELET
Abs Immature Granulocytes: 0.47 K/uL — ABNORMAL HIGH (ref 0.00–0.07)
Basophils Absolute: 0.1 K/uL (ref 0.0–0.1)
Basophils Relative: 0 %
Eosinophils Absolute: 0 K/uL (ref 0.0–0.5)
Eosinophils Relative: 0 %
HCT: 31.8 % — ABNORMAL LOW (ref 36.0–46.0)
Hemoglobin: 8.5 g/dL — ABNORMAL LOW (ref 12.0–15.0)
Immature Granulocytes: 2 %
Lymphocytes Relative: 7 %
Lymphs Abs: 2.1 K/uL (ref 0.7–4.0)
MCH: 27.1 pg (ref 26.0–34.0)
MCHC: 26.7 g/dL — ABNORMAL LOW (ref 30.0–36.0)
MCV: 101.3 fL — ABNORMAL HIGH (ref 80.0–100.0)
Monocytes Absolute: 1.8 K/uL — ABNORMAL HIGH (ref 0.1–1.0)
Monocytes Relative: 6 %
Neutro Abs: 26.2 K/uL — ABNORMAL HIGH (ref 1.7–7.7)
Neutrophils Relative %: 85 %
Platelets: 130 K/uL — ABNORMAL LOW (ref 150–400)
RBC: 3.14 MIL/uL — ABNORMAL LOW (ref 3.87–5.11)
RDW: 25.4 % — ABNORMAL HIGH (ref 11.5–15.5)
Smear Review: NORMAL
WBC: 30.6 K/uL — ABNORMAL HIGH (ref 4.0–10.5)
nRBC: 8.2 % — ABNORMAL HIGH (ref 0.0–0.2)

## 2024-10-06 LAB — COMPREHENSIVE METABOLIC PANEL WITH GFR
ALT: 19 U/L (ref 0–44)
AST: 32 U/L (ref 15–41)
Albumin: 2.5 g/dL — ABNORMAL LOW (ref 3.5–5.0)
Alkaline Phosphatase: 178 U/L — ABNORMAL HIGH (ref 38–126)
Anion gap: 21 — ABNORMAL HIGH (ref 5–15)
BUN: 52 mg/dL — ABNORMAL HIGH (ref 8–23)
CO2: 25 mmol/L (ref 22–32)
Calcium: 9.3 mg/dL (ref 8.9–10.3)
Chloride: 120 mmol/L — ABNORMAL HIGH (ref 98–111)
Creatinine, Ser: 1.78 mg/dL — ABNORMAL HIGH (ref 0.44–1.00)
GFR, Estimated: 28 mL/min — ABNORMAL LOW (ref >60)
Glucose, Bld: 164 mg/dL — ABNORMAL HIGH (ref 70–99)
Potassium: 3.7 mmol/L (ref 3.5–5.1)
Sodium: 166 mmol/L (ref 135–145)
Total Bilirubin: 1.9 mg/dL — ABNORMAL HIGH (ref 0.0–1.2)
Total Protein: 6.1 g/dL — ABNORMAL LOW (ref 6.5–8.1)

## 2024-10-06 LAB — LIPASE, BLOOD: Lipase: 33 U/L (ref 11–51)

## 2024-10-06 MED ORDER — MORPHINE SULFATE (PF) 4 MG/ML IV SOLN
4.0000 mg | Freq: Once | INTRAVENOUS | Status: AC
  Administered 2024-10-07: 4 mg via INTRAVENOUS
  Filled 2024-10-06: qty 1

## 2024-10-06 MED ORDER — LACTATED RINGERS IV BOLUS
1000.0000 mL | Freq: Once | INTRAVENOUS | Status: AC
  Administered 2024-10-07: 1000 mL via INTRAVENOUS

## 2024-10-06 NOTE — ED Notes (Signed)
 Provider at bedside, I will come back to draw labs.  KM

## 2024-10-06 NOTE — ED Notes (Signed)
 No addl labs needed.  KM

## 2024-10-06 NOTE — ED Triage Notes (Addendum)
 Pt to ED via [facility] from home. Pt is on hospice, nurse on scene with EMS. Family c/o failure to thrive. Family reports that pt hasn't had any PO intake in 2 weeks. Family says pt vomited today and it smelled like stool. 110/68 P 126 R 14 BS: 144 GCS 9.

## 2024-10-06 NOTE — Discharge Instructions (Signed)
 As we discussed, I spoke with [facility] and if you need additional help at home or need to transition to the board their facilities just give them a call and they can help get that arranged.  You may return to the emergency department for any worsening symptoms.

## 2024-10-06 NOTE — ED Provider Notes (Signed)
 [place] [facility]MENT AT [facility] Provider Note   CSN: [id number] Arrival date & time: 10/06/24  2001     Patient presents with: Emesis, Loss of Appetite , and Weakness   Tiffany [name] is a 83 y.o. female with history of cancer, hypertension who presents to the emergency department today for failure to thrive.  Patient is on hospice care and the nurse on scene with EMS states that the family was concerned about failure to thrive.  Patient has not had any p.o. intake in 2 weeks and she vomited today and it smelled like stool.  Patient is overall has been declining based on chart review.  She was placed on hospice care at a most recent discharge on 08/23/2024.  Additional history from [facility] nurse and daughter.  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  Patient is a DNR.  Daughter states that she had 1 episode of vomiting today and has been having some struggle with bowel movements.  She had a very scant bowel movement this morning after multiple suppositories.  Patient's not been having any bowel movement but did not vomit that she had earlier in the day did smell like fecal matter which concerned the daughter for possible bowel obstruction.    Emesis Weakness Associated symptoms: vomiting        Prior to Admission medications   Medication Sig Start Date End Date Taking? Authorizing Provider  acetaminophen  (TYLENOL ) 500 MG tablet Take 2 tablets (1,000 mg total) by mouth 3 (three) times daily. Patient taking differently: Take 500 mg by mouth 2 (two) times daily. 08/10/24   Love, Sharlet RAMAN, PA-C  apixaban  (ELIQUIS ) 2.5 MG TABS tablet Take 1 tablet (2.5 mg total) by mouth every evening. 08/23/24   Samtani, Jai-Gurmukh, MD  apixaban  (ELIQUIS ) 5 MG TABS tablet Take 1 tablet (5 mg total) by mouth in the morning. 08/23/24   Samtani, Jai-Gurmukh, MD  dorzolamide  (TRUSOPT ) 2 % ophthalmic solution Place 1 drop into both eyes 2 (two) times daily. 06/24/24    [provider]  LUMIGAN 0.01 % SOLN Place 1 drop into both eyes at bedtime. 07/14/24   [provider]  mirtazapine  (REMERON ) 15 MG tablet Take 1 tablet (15 mg total) by mouth at bedtime. 08/10/24   Love, Sharlet RAMAN, PA-C  morphine  10 MG/5ML solution Take 1.3 mLs (2.6 mg total) by mouth every 4 (four) hours as needed for severe pain (pain score 7-10). 08/23/24   Samtani, Jai-Gurmukh, MD  pantoprazole  (PROTONIX ) 40 MG tablet Take 1 tablet (40 mg total) by mouth 2 (two) times daily. 08/10/24   Love, Sharlet RAMAN, PA-C    Allergies: Elemental sulfur, Shellfish allergy, Shellfish allergy, and Sulfa antibiotics    Review of Systems  Gastrointestinal:  Positive for vomiting.  Neurological:  Positive for weakness.  All other systems reviewed and are negative.   Updated Vital Signs BP 111/65   Pulse (!) 120   Temp 98.1 F (36.7 C) (Oral)   Resp (!) 27   SpO2 97%   Physical Exam Vitals and nursing note reviewed.  Constitutional:      General: She is not in acute distress.    Appearance: Normal appearance.  HENT:     Head: Normocephalic and atraumatic.  Eyes:     General:        Right eye: No discharge.        Left eye: No discharge.  Cardiovascular:     Rate and Rhythm: Tachycardia present.  Comments: S1/S2 are distinct without any evidence of murmur, rubs, or gallops.  Radial pulses are 2+ bilaterally.  Dorsalis pedis pulses are 2+ bilaterally.  No evidence of pedal edema. Pulmonary:     Comments: Clear to auscultation bilaterally.  Normal effort.  No respiratory distress.  No evidence of wheezes, rales, or rhonchi heard throughout. Abdominal:     General: Abdomen is flat. Bowel sounds are normal. There is no distension.     Tenderness: There is no abdominal tenderness. There is no guarding or rebound.  Musculoskeletal:        General: Normal range of motion.     Cervical back: Neck supple.  Skin:    General: Skin is warm and dry.     Findings: No rash.   Neurological:     General: No focal deficit present.     Mental Status: She is alert.  Psychiatric:        Mood and Affect: Mood normal.        Behavior: Behavior normal.     (all labs ordered are listed, but only abnormal results are displayed) Labs Reviewed  CBC WITH DIFFERENTIAL/PLATELET - Abnormal; Notable for the following components:      Result Value   WBC 30.6 (*)    RBC 3.14 (*)    Hemoglobin 8.5 (*)    HCT 31.8 (*)    MCV 101.3 (*)    MCHC 26.7 (*)    RDW 25.4 (*)    Platelets 130 (*)    nRBC 8.2 (*)    Neutro Abs 26.2 (*)    Monocytes Absolute 1.8 (*)    Abs Immature Granulocytes 0.47 (*)    All other components within normal limits  COMPREHENSIVE METABOLIC PANEL WITH GFR - Abnormal; Notable for the following components:   Sodium 166 (*)    Chloride 120 (*)    Glucose, Bld 164 (*)    BUN 52 (*)    Creatinine, Ser 1.78 (*)    Total Protein 6.1 (*)    Albumin 2.5 (*)    Alkaline Phosphatase 178 (*)    Total Bilirubin 1.9 (*)    GFR, Estimated 28 (*)    Anion gap 21 (*)    All other components within normal limits  LIPASE, BLOOD    EKG: None  Radiology: CT ABDOMEN PELVIS WO CONTRAST Result Date: 10/06/2024 EXAM: CT ABDOMEN AND PELVIS WITHOUT CONTRAST 10/06/2024 11:28:29 PM TECHNIQUE: CT of the abdomen and pelvis was performed without the administration of intravenous contrast. Multiplanar reformatted images are provided for review. Automated exposure control, iterative reconstruction, and/or weight-based adjustment of the mA/kV was utilized to reduce the radiation dose to as low as reasonably achievable. COMPARISON: CT abdomen and pelvis 08/20/2024. CLINICAL HISTORY: Abdominal pain, acute, nonlocalized. FINDINGS: LOWER CHEST: Coronary artery calcification. Cardiac changes suggestive of anemia. LIVER: Poor visualization of known conglomerate hepatic masses. GALLBLADDER AND BILE DUCTS: Gallbladder is unremarkable. No biliary ductal dilatation. SPLEEN: No acute  abnormality. PANCREAS: No acute abnormality. ADRENAL GLANDS: No acute abnormality. KIDNEYS, URETERS AND BLADDER: Fluid-dense lesions of the left kidney again noted and likely represents a simple renal cysts. Simple renal cysts do not require additional follow-up unless clinically indicated due to signs/symptoms. No stones in the kidneys or ureters. No hydronephrosis. No perinephric or periureteral stranding. Urinary bladder is unremarkable. GI AND BOWEL: Persistent marked hypodense lesser curvature of the stomach wall thickening. No small or large bowel thickening or dilatation. The appendix is not definitely identified with no inflammatory  changes in the right lower quadrant to suggest acute appendicitis. There is no bowel obstruction. 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 No free air. 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 Atherosclerotic plaque. Aorta is normal in caliber. LYMPH NODES: No lymphadenopathy. REPRODUCTIVE ORGANS: No acute abnormality. BONES AND SOFT TISSUES: No acute osseous abnormality. No focal soft tissue abnormality. IMPRESSION: 1. No acute findings. 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 No evidence of metastatic disease progression on this noncontrast study; hepatic metastases are poorly visualized and prior IVC/iliac thrombi cannot be assessed. Persistent gastric wall thickening. Electronically signed by: Morgane Naveau MD 10/06/2024 11:39 PM EST RP Workstation: [id number]     Procedures   Medications Ordered in the ED  lactated ringers  bolus 1,000 mL (has no administration in time range)  morphine  (PF) 4 MG/ML injection 4 mg (has no administration in time range)    Clinical Course as of 10/06/24 2351  Fri Oct 06, 2024  2303 I spoke with the daughter at the bedside at  length about the goals of the patient's care and wishes.  Daughter's main concern was that the patient had a bowel obstruction.  If she does have a bowel obstruction on CT, she would like NG tube and decompression.  Otherwise, the daughter would like the patient to be discharged back home and her hospice care. She does not want any invasive interventions.  [CF]  2332 I spoke with the [facility] after speaking with the patient about patient's wishes. Plan to discharge back to home with hospice care after workout completed.  [CF]  2336 CBC with Differential(!) Significant leukocytosis worsen the patient's baseline.  Anemia is also worse than the patient's baseline. [CF]  2336 Comprehensive metabolic panel(!!) Sodium of 166 with a new AKI. [CF]  2345 Ice spoke with the daughter again at the bedside about the findings of the CT abdomen without contrast and there was no evidence of acute findings or bowel obstruction at this time.  We went ahead and care to the cultures, lactic, and urinalysis is the daughter does not want to a ton of interventions at this time which I think is totally appropriate given the patient's current medical condition.  Will plan to discharge home where they have hospice care.  Daughter is comfortable with plan patient will be overall safe for discharge at this time. [CF]    Clinical Course User Index [CF] Theotis Cameron HERO, PA-C    Medical Decision Making Rori Goar is a 83 y.o. female patient who presents to the emergency department today for further evaluation of vomiting and failure to thrive.  Patient is not responding a whole lot but just tends to look at me.  Eyes track where I am at in the room.  She does have some saliva bubbling out of her mouth.  Respirations are normal.  She was tachycardic during my physical exam.  Patient's abdomen is soft and nontender overall.  However, given the patient's history of cancer and some vomiting which could be described as fecal  per triage note will plan to get some labs and CT scan.  No evidence of bowel obstruction on CT.  CT is normal apart from the chronic cancer findings.  As highlighted in the ED course, went ahead and canceled all the rest of the interpensions and labs.  Will plan to discharge home for hospice  care.  Daughter is agreeable with plan. [facility] was also contacted and they are aware of situation.  Strict return precautions were discussed.  She is safe for discharge.  Amount and/or Complexity of Data Reviewed Labs: ordered. Decision-making details documented in ED Course. Radiology: ordered.  Risk Prescription drug management.     Final diagnoses:  Failure to thrive in adult    ED Discharge Orders     None          Theotis Cameron HERO, [place] 10/06/24 2351    Elnor Savant A, DO 11/01/2024 [phone]

## 2024-10-12 DIAGNOSIS — I63512 Cerebral infarction due to unspecified occlusion or stenosis of left middle cerebral artery: Secondary | ICD-10-CM | POA: Diagnosis not present

## 2024-11-02 DEATH — deceased
